# Patient Record
Sex: Male | Born: 1955 | Race: White | Hispanic: No | Smoking: Current some day smoker
Health system: Southern US, Community
[De-identification: ages and names within clinical notes are randomized; demographics above are authoritative.]

## PROBLEM LIST (undated history)

## (undated) DIAGNOSIS — I471 Supraventricular tachycardia, unspecified: Secondary | ICD-10-CM

## (undated) DIAGNOSIS — C7931 Secondary malignant neoplasm of brain: Secondary | ICD-10-CM

## (undated) DIAGNOSIS — I872 Venous insufficiency (chronic) (peripheral): Secondary | ICD-10-CM

## (undated) DIAGNOSIS — J449 Chronic obstructive pulmonary disease, unspecified: Secondary | ICD-10-CM

## (undated) DIAGNOSIS — F1191 Opioid use, unspecified, in remission: Secondary | ICD-10-CM

## (undated) DIAGNOSIS — I890 Lymphedema, not elsewhere classified: Secondary | ICD-10-CM

## (undated) DIAGNOSIS — Z87898 Personal history of other specified conditions: Secondary | ICD-10-CM

## (undated) DIAGNOSIS — R918 Other nonspecific abnormal finding of lung field: Secondary | ICD-10-CM

## (undated) DIAGNOSIS — C4491 Basal cell carcinoma of skin, unspecified: Secondary | ICD-10-CM

## (undated) DIAGNOSIS — F329 Major depressive disorder, single episode, unspecified: Secondary | ICD-10-CM

## (undated) DIAGNOSIS — F32A Depression, unspecified: Secondary | ICD-10-CM

## (undated) HISTORY — DX: Secondary malignant neoplasm of brain: C79.31

## (undated) HISTORY — PX: HERNIA REPAIR: SHX51

---

## 1998-09-07 ENCOUNTER — Emergency Department (HOSPITAL_COMMUNITY): Admission: EM | Admit: 1998-09-07 | Discharge: 1998-09-07 | Payer: Self-pay | Admitting: Emergency Medicine

## 1998-09-07 ENCOUNTER — Encounter: Payer: Self-pay | Admitting: Emergency Medicine

## 2001-05-07 ENCOUNTER — Emergency Department (HOSPITAL_COMMUNITY): Admission: EM | Admit: 2001-05-07 | Discharge: 2001-05-07 | Payer: Self-pay | Admitting: Emergency Medicine

## 2005-03-14 ENCOUNTER — Emergency Department (HOSPITAL_COMMUNITY): Admission: EM | Admit: 2005-03-14 | Discharge: 2005-03-14 | Payer: Self-pay | Admitting: Emergency Medicine

## 2005-11-12 ENCOUNTER — Emergency Department (HOSPITAL_COMMUNITY): Admission: EM | Admit: 2005-11-12 | Discharge: 2005-11-12 | Payer: Self-pay | Admitting: Emergency Medicine

## 2005-12-02 ENCOUNTER — Emergency Department (HOSPITAL_COMMUNITY): Admission: EM | Admit: 2005-12-02 | Discharge: 2005-12-02 | Payer: Self-pay | Admitting: Emergency Medicine

## 2006-02-09 ENCOUNTER — Emergency Department (HOSPITAL_COMMUNITY): Admission: EM | Admit: 2006-02-09 | Discharge: 2006-02-09 | Payer: Self-pay | Admitting: Family Medicine

## 2007-01-30 ENCOUNTER — Emergency Department (HOSPITAL_COMMUNITY): Admission: EM | Admit: 2007-01-30 | Discharge: 2007-01-30 | Payer: Self-pay | Admitting: Emergency Medicine

## 2007-03-29 ENCOUNTER — Emergency Department (HOSPITAL_COMMUNITY): Admission: EM | Admit: 2007-03-29 | Discharge: 2007-03-29 | Payer: Self-pay | Admitting: Emergency Medicine

## 2007-05-25 ENCOUNTER — Emergency Department (HOSPITAL_COMMUNITY): Admission: EM | Admit: 2007-05-25 | Discharge: 2007-05-25 | Payer: Self-pay | Admitting: Emergency Medicine

## 2007-07-07 ENCOUNTER — Emergency Department (HOSPITAL_COMMUNITY): Admission: EM | Admit: 2007-07-07 | Discharge: 2007-07-07 | Payer: Self-pay | Admitting: Emergency Medicine

## 2007-10-18 ENCOUNTER — Inpatient Hospital Stay (HOSPITAL_COMMUNITY): Admission: EM | Admit: 2007-10-18 | Discharge: 2007-10-21 | Payer: Self-pay | Admitting: Emergency Medicine

## 2008-03-26 ENCOUNTER — Emergency Department (HOSPITAL_COMMUNITY): Admission: EM | Admit: 2008-03-26 | Discharge: 2008-03-26 | Payer: Self-pay | Admitting: Family Medicine

## 2008-05-23 ENCOUNTER — Emergency Department (HOSPITAL_COMMUNITY): Admission: EM | Admit: 2008-05-23 | Discharge: 2008-05-23 | Payer: Self-pay | Admitting: Family Medicine

## 2008-07-24 ENCOUNTER — Emergency Department (HOSPITAL_COMMUNITY): Admission: EM | Admit: 2008-07-24 | Discharge: 2008-07-24 | Payer: Self-pay | Admitting: Emergency Medicine

## 2008-09-11 ENCOUNTER — Emergency Department (HOSPITAL_COMMUNITY): Admission: EM | Admit: 2008-09-11 | Discharge: 2008-09-11 | Payer: Self-pay | Admitting: Family Medicine

## 2009-02-05 ENCOUNTER — Emergency Department (HOSPITAL_COMMUNITY): Admission: EM | Admit: 2009-02-05 | Discharge: 2009-02-05 | Payer: Self-pay | Admitting: Family Medicine

## 2010-02-19 ENCOUNTER — Emergency Department (HOSPITAL_COMMUNITY): Admission: EM | Admit: 2010-02-19 | Discharge: 2010-02-20 | Payer: Self-pay | Admitting: Emergency Medicine

## 2010-07-29 LAB — POCT I-STAT, CHEM 8
BUN: 6 mg/dL (ref 6–23)
Creatinine, Ser: 0.9 mg/dL (ref 0.4–1.5)
Glucose, Bld: 92 mg/dL (ref 70–99)
Hemoglobin: 17 g/dL (ref 13.0–17.0)
TCO2: 22 mmol/L (ref 0–100)

## 2010-07-29 LAB — URINE MICROSCOPIC-ADD ON

## 2010-07-29 LAB — URINALYSIS, ROUTINE W REFLEX MICROSCOPIC
Bilirubin Urine: NEGATIVE
Glucose, UA: NEGATIVE mg/dL
Protein, ur: NEGATIVE mg/dL
pH: 5.5 (ref 5.0–8.0)

## 2010-07-29 LAB — URINE CULTURE
Colony Count: NO GROWTH
Culture  Setup Time: 201110070348

## 2010-09-29 NOTE — H&P (Signed)
Omar Wagner, Omar Wagner                 ACCOUNT NO.:  [id number]   MEDICAL RECORD NO.:  [id number]          PATIENT TYPE:  EMS   LOCATION:  ED                           FACILITY:  [facility]   PHYSICIAN:  Mobolaji B. Bakare, M.D.DATE OF BIRTH:  January 24, 1956   DATE OF ADMISSION:  10/17/2007  DATE OF DISCHARGE:                              HISTORY & PHYSICAL   PRIMARY CARE PHYSICIAN:  Unassigned.   CHIEF COMPLAINT:  Left elbow pain and swelling.   HISTORY OF PRESENTING COMPLAINT:  The patient developed a boil over the  left elbow about 3 days ago.  He thought it was a spider bite.  He could  not recall being bitten by a spider.  He pressed on this boil.  The left  elbow subsequently became more swollen and red, associated with pain.  There is no limitation of elbow movement.  He decided to come to the  hospital for evaluation.   He denies fever, chills, no headaches.  He denies nausea, vomiting or  diarrhea.  His appetite has been good.   REVIEW OF SYSTEMS:  No chest pain, cough, shortness of breath, orthopnea  or PND.   PAST MEDICAL HISTORY:  None.   PAST SURGICAL HISTORY:  Hernia repair in childhood.   FAMILY HISTORY:  Both parents are deceased.  Father passed away in his  mid 7s from liver cancer.  Mother passed away in her mid 82s.  She had  breast cancer.   SOCIAL HISTORY:  The patient is a Music therapist.  He occasionally drinks  beer.  He smokes half a pack of cigarettes a day.   INITIAL VITALS:  Temperature 97.8, blood pressure 116/56, pulse of 99,  respiratory rate of 18.  O2 saturations of 94%.  On examination, the patient is awake, alert, oriented in time, place and  person.  Normocephalic, atraumatic head.  Pupils equal, round and reactive to  light.  No elevated JVD.  No carotid bruits.  Mucous membranes moist.  LUNGS:  Clear clinically to auscultation.  CVS:  S1, S2, regular.  No murmur or gallop.  ABDOMEN:  Nondistended, soft, nontender.  Bowel sounds present.  EXTREMITIES: No pedal edema or calf tenderness.  He has symmetric tan  area in his lower legs in (area of exposed skin.).  No distal cyanosis.  Dorsalis pedis pulses palpable bilaterally.  MUSCULOSKELETAL:  There is diffuse swelling, redness and mild tenderness  over left elbow.  The redness has more extended to the antecubital  fossa.  There is some degree of fluctuance but no drainage noted.  Radial pulse intact.  CNS:  No focal neurological deficit.   INITIAL LABORATORY DATA:  Sodium 144, potassium 4.4, chloride 20,  glucose 88, BUN 13, creatinine 0.87, calcium 8.9.  White cells 12.1,  hemoglobin 13.7, hematocrit 39.2, platelets 227.  Absolute neutrophil  count 8.6.  X-ray of the left elbow showed soft tissue swelling without  acute bony or joint abnormality.   ASSESSMENT AND PLAN:  1. [name] is a 55 year old Caucasian male presenting with left      elbow cellulitis/abscess.  He  has leukocytosis of 12,000.  He has      received IV vancomycin in the emergency room.  Will continue IV      vancomycin for methicillin-resistant Staphylococcus aureus      coverage.  We will give Dilaudid 0.5 to 1 mg IV q.4 h. p.r.n. for      severe pain and oxycodone 5 mg p.o. q.4 h. p.r.n. for mild to      moderate pain.  We will obtain surgical consult for incision and      drainage, elevate left elbow.  2. Tobacco abuse.  We will offer tobacco cessation counseling, place a      nicotine patch 14 mg daily.      Mobolaji B. Corky Downs, M.D.  Electronically Signed     MBB/MEDQ  D:  10/18/2007  T:  10/18/2007  Job:  [id number]

## 2010-10-02 NOTE — Discharge Summary (Signed)
Omar Wagner, Omar Wagner                 ACCOUNT NO.:  [id number]   MEDICAL RECORD NO.:  [id number]          PATIENT TYPE:  INP   LOCATION:  1504                         FACILITY:  [facility]   PHYSICIAN:  Hettie Holstein, D.O.    DATE OF BIRTH:  May 17, 1956   DATE OF ADMISSION:  10/17/2007  DATE OF DISCHARGE:  10/21/2007                               DISCHARGE SUMMARY   PRIMARY CARE PHYSICIAN:  Unassigned.   FINAL DIAGNOSES:  1. Cellulitis with a carbuncle, status post incision and drainage and      an inpatient consultation of by Dr. Luretha Murphy.  2. Facial skin lesion, probable basal cell, right cheek.   MEDICATIONS ON DISCHARGE:  1. Clindamycin 450 mg p.o. t.i.d.  2. Percocet 10/325 q.6 h. as needed dispense #20.  3. Over-the-counter Neosporin to open wounds twice daily.   DISPOSITION:  [name] was instructed to follow up with Dr. Wenda Low. He is instructed to keep his wounds clean and dry and elevate  the arm while at rest.   Studies performed this hospitalization included plain radiograph of his  elbow that revealed soft tissue swelling without acute bony or joint  abnormality.   LABORATORY DATA:  CBC with WBC of 8.2, hemoglobin 13.5 and a platelet  count of 235.  His basic metabolic panel revealed a sodium 140,  potassium 3.9, BUN 7, creatinine 0.8.   CONSULTANTS:  This admission:  General surgery, Dr. Luretha Murphy.   PROCEDURES:  None were performed.   HOSPITAL COURSE:  [name] is a pleasant 55 year old male who  developed a boil over the left elbow about 3 days prior to presentation.  He thought it was a spider bite. He cannot recall being bitten.  He  pressed on this boil, he stated that his elbow became more swollen. He  subsequently presented to the emergency department. He was admitted,  initially started on IV antibiotics with vancomycin for consideration of  Staph.  He required some IV narcotics for pain control.  Ultimately was  seen by general surgery,  Dr. Luretha Murphy, who evaluated this and  noticed some multiple pustules located on his elbow with surrounding  cellulitis.  No other palpable fluctuant areas were noted, and incision  and drainage was performed. There was a moderate amount of yellow-white,  nonfocal-  smelling pus. He was subsequently treated with continued IV vancomycin  and ultimately transitioned to oral clindamycin which he tolerated well.  He was felt to be stable for discharge home and follow up with Dr.  Daphine Deutscher in a week.  He is provided a number to call to schedule this  appointment.      Hettie Holstein, D.O.  Electronically Signed     ESS/MEDQ  D:  01/18/2008  T:  01/19/2008  Job:  [id number]

## 2010-10-08 ENCOUNTER — Ambulatory Visit (INDEPENDENT_AMBULATORY_CARE_PROVIDER_SITE_OTHER): Payer: Self-pay

## 2010-10-08 ENCOUNTER — Inpatient Hospital Stay (INDEPENDENT_AMBULATORY_CARE_PROVIDER_SITE_OTHER)
Admission: RE | Admit: 2010-10-08 | Discharge: 2010-10-08 | Disposition: A | Discharge status: Home / Self Care | Payer: Self-pay | Source: Ambulatory Visit | Attending: Emergency Medicine | Admitting: Emergency Medicine

## 2010-10-08 DIAGNOSIS — J40 Bronchitis, not specified as acute or chronic: Secondary | ICD-10-CM

## 2010-10-18 ENCOUNTER — Emergency Department (HOSPITAL_COMMUNITY)
Admission: EM | Admit: 2010-10-18 | Discharge: 2010-10-19 | Disposition: A | Discharge status: Home / Self Care | Payer: Self-pay | Attending: Emergency Medicine | Admitting: Emergency Medicine

## 2010-10-18 DIAGNOSIS — R109 Unspecified abdominal pain: Secondary | ICD-10-CM | POA: Insufficient documentation

## 2010-10-18 DIAGNOSIS — R319 Hematuria, unspecified: Secondary | ICD-10-CM | POA: Insufficient documentation

## 2010-10-18 DIAGNOSIS — N390 Urinary tract infection, site not specified: Secondary | ICD-10-CM | POA: Insufficient documentation

## 2010-10-19 ENCOUNTER — Encounter (HOSPITAL_COMMUNITY): Payer: Self-pay

## 2010-10-19 ENCOUNTER — Emergency Department (HOSPITAL_COMMUNITY): Payer: Self-pay

## 2010-10-19 LAB — URINALYSIS, ROUTINE W REFLEX MICROSCOPIC
Bilirubin Urine: NEGATIVE
Glucose, UA: NEGATIVE mg/dL
Protein, ur: 100 mg/dL — AB
Specific Gravity, Urine: 1.008 (ref 1.005–1.030)
Urobilinogen, UA: 1 mg/dL (ref 0.0–1.0)

## 2010-10-19 LAB — URINE MICROSCOPIC-ADD ON

## 2010-10-19 LAB — BASIC METABOLIC PANEL
Calcium: 8.9 mg/dL (ref 8.4–10.5)
GFR calc Af Amer: >60 mL/min (ref >60)
GFR calc non Af Amer: >60 mL/min (ref >60)
Glucose, Bld: 74 mg/dL (ref 70–99)
Potassium: 4.4 mEq/L (ref 3.5–5.1)
Sodium: 141 mEq/L (ref 135–145)

## 2010-11-24 ENCOUNTER — Ambulatory Visit (HOSPITAL_COMMUNITY)
Admission: EM | Admit: 2010-11-24 | Discharge: 2010-11-24 | Discharge status: Against Medical Advice | Payer: Self-pay | Source: Home / Self Care | Attending: Emergency Medicine | Admitting: Emergency Medicine

## 2010-11-24 ENCOUNTER — Emergency Department (HOSPITAL_COMMUNITY)
Admission: EM | Admit: 2010-11-24 | Discharge: 2010-11-24 | Disposition: A | Discharge status: Home / Self Care | Payer: Self-pay | Attending: Emergency Medicine | Admitting: Emergency Medicine

## 2010-11-24 DIAGNOSIS — N390 Urinary tract infection, site not specified: Secondary | ICD-10-CM | POA: Insufficient documentation

## 2010-11-24 DIAGNOSIS — R109 Unspecified abdominal pain: Secondary | ICD-10-CM | POA: Insufficient documentation

## 2010-11-24 DIAGNOSIS — R319 Hematuria, unspecified: Secondary | ICD-10-CM | POA: Insufficient documentation

## 2010-11-24 LAB — URINALYSIS, ROUTINE W REFLEX MICROSCOPIC
Nitrite: NEGATIVE
Specific Gravity, Urine: 1.011 (ref 1.005–1.030)
Urobilinogen, UA: 0.2 mg/dL (ref 0.0–1.0)
pH: 5 (ref 5.0–8.0)

## 2010-11-24 LAB — URINE MICROSCOPIC-ADD ON

## 2011-01-26 ENCOUNTER — Emergency Department (HOSPITAL_COMMUNITY)
Admission: EM | Admit: 2011-01-26 | Discharge: 2011-01-27 | Disposition: A | Discharge status: Home / Self Care | Payer: Self-pay | Attending: Emergency Medicine | Admitting: Emergency Medicine

## 2011-01-26 DIAGNOSIS — L02419 Cutaneous abscess of limb, unspecified: Secondary | ICD-10-CM | POA: Insufficient documentation

## 2011-01-27 ENCOUNTER — Emergency Department (HOSPITAL_COMMUNITY): Payer: Self-pay

## 2011-01-27 LAB — CBC
HCT: 45.2 % (ref 39.0–52.0)
MCHC: 33.8 g/dL (ref 30.0–36.0)
Platelets: 135 10*3/uL — ABNORMAL LOW (ref 150–400)
RDW: 13.5 % (ref 11.5–15.5)
WBC: 7.3 10*3/uL (ref 4.0–10.5)

## 2011-01-27 LAB — DIFFERENTIAL
Basophils Absolute: 0 10*3/uL (ref 0.0–0.1)
Basophils Relative: 0 % (ref 0–1)
Eosinophils Absolute: 0.1 10*3/uL (ref 0.0–0.7)
Eosinophils Relative: 2 % (ref 0–5)
Monocytes Absolute: 1 10*3/uL (ref 0.1–1.0)

## 2011-01-30 ENCOUNTER — Emergency Department (HOSPITAL_COMMUNITY): Payer: Self-pay

## 2011-01-30 ENCOUNTER — Emergency Department (HOSPITAL_COMMUNITY)
Admission: EM | Admit: 2011-01-30 | Discharge: 2011-01-30 | Disposition: A | Discharge status: Home / Self Care | Payer: Self-pay | Attending: Emergency Medicine | Admitting: Emergency Medicine

## 2011-01-30 DIAGNOSIS — Z09 Encounter for follow-up examination after completed treatment for conditions other than malignant neoplasm: Secondary | ICD-10-CM | POA: Insufficient documentation

## 2011-01-30 DIAGNOSIS — L02419 Cutaneous abscess of limb, unspecified: Secondary | ICD-10-CM | POA: Insufficient documentation

## 2011-02-04 LAB — URINE CULTURE: Colony Count: >=100000

## 2011-02-04 LAB — URINALYSIS, ROUTINE W REFLEX MICROSCOPIC
Nitrite: NEGATIVE
Protein, ur: NEGATIVE
Urobilinogen, UA: 0.2

## 2011-02-04 LAB — URINE MICROSCOPIC-ADD ON

## 2011-02-04 LAB — BASIC METABOLIC PANEL
BUN: 8
CO2: 29
Calcium: 8.6
Glucose, Bld: 101 — ABNORMAL HIGH
Potassium: 4.6
Sodium: 138

## 2011-02-11 LAB — DIFFERENTIAL
Basophils Absolute: 0.1
Basophils Relative: 1
Eosinophils Absolute: 0.2
Eosinophils Absolute: 0.2
Lymphs Abs: 2.6
Monocytes Absolute: 0.7
Monocytes Absolute: 0.8
Monocytes Relative: 6
Neutro Abs: 8.2 — ABNORMAL HIGH
Neutrophils Relative %: 67
Neutrophils Relative %: 71

## 2011-02-11 LAB — BASIC METABOLIC PANEL
BUN: 7
CO2: 20
CO2: 27
Calcium: 8.5
Chloride: 109
Creatinine, Ser: 0.87
Creatinine, Ser: 0.88
GFR calc Af Amer: >60
Glucose, Bld: 180 — ABNORMAL HIGH
Potassium: 4.1
Sodium: 141

## 2011-02-11 LAB — CBC
Hemoglobin: 12.9 — ABNORMAL LOW
Hemoglobin: 13.7
MCHC: 34
MCHC: 34.3
MCHC: 35.1
MCV: 98.7
MCV: 99.6
Platelets: 235
RBC: 3.77 — ABNORMAL LOW
RBC: 3.97 — ABNORMAL LOW
RDW: 13.5
RDW: 13.7
WBC: 12.1 — ABNORMAL HIGH

## 2011-02-11 LAB — VANCOMYCIN, TROUGH: Vancomycin Tr: 8.2

## 2011-03-30 ENCOUNTER — Emergency Department (INDEPENDENT_AMBULATORY_CARE_PROVIDER_SITE_OTHER)
Admission: EM | Admit: 2011-03-30 | Discharge: 2011-03-30 | Disposition: A | Discharge status: Home / Self Care | Payer: Self-pay | Source: Home / Self Care | Attending: Family Medicine | Admitting: Family Medicine

## 2011-03-30 ENCOUNTER — Encounter (HOSPITAL_COMMUNITY): Payer: Self-pay | Admitting: *Deleted

## 2011-03-30 ENCOUNTER — Emergency Department (INDEPENDENT_AMBULATORY_CARE_PROVIDER_SITE_OTHER): Payer: Self-pay

## 2011-03-30 DIAGNOSIS — J449 Chronic obstructive pulmonary disease, unspecified: Secondary | ICD-10-CM

## 2011-03-30 DIAGNOSIS — Z85828 Personal history of other malignant neoplasm of skin: Secondary | ICD-10-CM

## 2011-03-30 DIAGNOSIS — J4489 Other specified chronic obstructive pulmonary disease: Secondary | ICD-10-CM

## 2011-03-30 HISTORY — DX: Chronic obstructive pulmonary disease, unspecified: J44.9

## 2011-03-30 HISTORY — DX: Basal cell carcinoma of skin, unspecified: C44.91

## 2011-03-30 MED ORDER — ALBUTEROL SULFATE (5 MG/ML) 0.5% IN NEBU
INHALATION_SOLUTION | RESPIRATORY_TRACT | Status: AC
  Filled 2011-03-30: qty 1

## 2011-03-30 MED ORDER — IPRATROPIUM BROMIDE 0.02 % IN SOLN
0.5000 mg | Freq: Once | RESPIRATORY_TRACT | Status: AC
  Administered 2011-03-30: 0.5 mg via RESPIRATORY_TRACT

## 2011-03-30 MED ORDER — ALBUTEROL SULFATE (5 MG/ML) 0.5% IN NEBU
5.0000 mg | INHALATION_SOLUTION | Freq: Once | RESPIRATORY_TRACT | Status: AC
  Administered 2011-03-30: 5 mg via RESPIRATORY_TRACT

## 2011-03-30 MED ORDER — ALBUTEROL SULFATE HFA 108 (90 BASE) MCG/ACT IN AERS: 1.0000 | INHALATION_SPRAY | Freq: Four times a day (QID) | RESPIRATORY_TRACT | Status: DC | PRN

## 2011-03-30 NOTE — ED Notes (Signed)
Breathing much easier now.  Waiting for xray results.  Offered pt something to drink, but he declined

## 2011-03-30 NOTE — ED Provider Notes (Signed)
History     CSN: [id number] Arrival date & time: 03/30/2011  8:19 AM   First MD Initiated Contact with Patient 03/30/11 [phone]      Chief Complaint  Patient presents with  . Shortness of Breath  . Skin Ulcer    (Consider location/radiation/quality/duration/timing/severity/associated sxs/prior treatment) Patient is a 55 y.o. male presenting with shortness of breath. The history is provided by the patient.  Shortness of Breath  Episode onset: ran out of inhaler 2 mos ago, continues to smoke,  The onset was gradual. The problem occurs continuously. The problem has been gradually worsening. The problem is moderate. The symptoms are relieved by nothing. Associated symptoms include shortness of breath and wheezing. He has inhaled smoke recently. His past medical history is significant for past wheezing. Past medical history comments: h/o copd.    Past Medical History  Diagnosis Date  . COPD (chronic obstructive pulmonary disease)   . Basal cell carcinoma     History reviewed. No pertinent past surgical history.  No family history on file.  History  Substance Use Topics  . Smoking status: Current Everyday Smoker -- 0.5 packs/day  . Smokeless tobacco: Not on file  . Alcohol Use: Yes     social      Review of Systems  Respiratory: Positive for shortness of breath and wheezing.   Skin: Positive for rash.    Allergies  Review of patient's allergies indicates no known allergies.  Home Medications   Current Outpatient Rx  Name Route Sig Dispense Refill  . ALBUTEROL SULFATE HFA IN Inhalation Inhale into the lungs.        BP 142/92  Pulse 100  Temp(Src) 97 F (36.1 C) (Oral)  Resp 24  SpO2 95%  Physical Exam  Constitutional: He appears well-developed and well-nourished.  HENT:  Head: Normocephalic and atraumatic.  Neck: Normal range of motion. Neck supple. No JVD present. No tracheal deviation present.  Cardiovascular: Normal rate, regular rhythm, normal heart  sounds and intact distal pulses.   Pulmonary/Chest: No stridor. He has decreased breath sounds. He has rhonchi. He has no rales.  Lymphadenopathy:    He has no cervical adenopathy.  Skin: Lesion noted.       ED Course  Procedures (including critical care time)  Labs Reviewed - No data to display No results found.   No diagnosis found.  Sx improved after neb treatment.  MDM  X-rays reviewed and report per radiologist.        Barkley Bruns, MD 03/30/11 [phone]

## 2011-03-30 NOTE — ED Notes (Signed)
Pt has hx of COPD.  Has been out of inhaler for about 2 mos.  States sob gradually getting worse.  Denies fever or cough.  Pt talking in short, choppy sentences.  Also wants to skin lesions on his face checked.  States he had the areas burned off by [facility] over a year ago, was told it was basal cell carcinoma.  States the lesions are worse looking and painful.  One on right side of nose and one on right cheek, dry, discolored lesions.

## 2011-06-10 ENCOUNTER — Emergency Department (HOSPITAL_COMMUNITY)
Admission: EM | Admit: 2011-06-10 | Discharge: 2011-06-10 | Disposition: A | Discharge status: Home / Self Care | Payer: Self-pay | Attending: Emergency Medicine | Admitting: Emergency Medicine

## 2011-06-10 ENCOUNTER — Encounter (HOSPITAL_COMMUNITY): Payer: Self-pay | Admitting: *Deleted

## 2011-06-10 DIAGNOSIS — L02419 Cutaneous abscess of limb, unspecified: Secondary | ICD-10-CM | POA: Insufficient documentation

## 2011-06-10 DIAGNOSIS — Z85828 Personal history of other malignant neoplasm of skin: Secondary | ICD-10-CM | POA: Insufficient documentation

## 2011-06-10 DIAGNOSIS — Z79899 Other long term (current) drug therapy: Secondary | ICD-10-CM | POA: Insufficient documentation

## 2011-06-10 DIAGNOSIS — IMO0002 Reserved for concepts with insufficient information to code with codable children: Secondary | ICD-10-CM | POA: Insufficient documentation

## 2011-06-10 DIAGNOSIS — F17200 Nicotine dependence, unspecified, uncomplicated: Secondary | ICD-10-CM | POA: Insufficient documentation

## 2011-06-10 DIAGNOSIS — J449 Chronic obstructive pulmonary disease, unspecified: Secondary | ICD-10-CM | POA: Insufficient documentation

## 2011-06-10 DIAGNOSIS — L03119 Cellulitis of unspecified part of limb: Secondary | ICD-10-CM | POA: Insufficient documentation

## 2011-06-10 DIAGNOSIS — J4489 Other specified chronic obstructive pulmonary disease: Secondary | ICD-10-CM | POA: Insufficient documentation

## 2011-06-10 MED ORDER — OXYCODONE-ACETAMINOPHEN 5-325 MG PO TABS
2.0000 | ORAL_TABLET | Freq: Once | ORAL | Status: AC
  Administered 2011-06-10: 2 via ORAL
  Filled 2011-06-10: qty 2

## 2011-06-10 MED ORDER — CLINDAMYCIN HCL 150 MG PO CAPS: 450.0000 mg | ORAL_CAPSULE | Freq: Three times a day (TID) | ORAL | Status: AC

## 2011-06-10 MED ORDER — MORPHINE SULFATE 4 MG/ML IJ SOLN
4.0000 mg | Freq: Once | INTRAMUSCULAR | Status: AC
  Administered 2011-06-10: 4 mg via INTRAVENOUS
  Filled 2011-06-10: qty 1

## 2011-06-10 MED ORDER — HYDROCODONE-ACETAMINOPHEN 7.5-500 MG PO TABS: 1.0000 | ORAL_TABLET | Freq: Four times a day (QID) | ORAL | Status: AC | PRN

## 2011-06-10 MED ORDER — CLINDAMYCIN PHOSPHATE 900 MG/50ML IV SOLN
900.0000 mg | Freq: Once | INTRAVENOUS | Status: AC
  Administered 2011-06-10: 900 mg via INTRAVENOUS
  Filled 2011-06-10: qty 50

## 2011-06-10 NOTE — ED Provider Notes (Signed)
History     CSN: [id number]  Arrival date & time 06/10/11  1314   First MD Initiated Contact with Patient 06/10/11 1347      Chief Complaint  Patient presents with  . Wound Infection     The history is provided by the patient.  The patient is a 56 year old male who presents with a gradually worsening wound to the left inner ankle. While cutting bushes 10 days ago, he sustained an abrasion and what he thought was an insect bite to the left ankle. He noticed redness to the "bite" area the next day and took 3 days-worth of doxycycline he had left over from a prior illness with some temporary improvement. After he finished the doxy, the wound gradually worsened again with erythema and swelling until it opened and began to drain purulent material in the last couple of days. There has been associated pain to the ankle that is worse with walking. There is no radiation of the pain. He denies any fever, chills, redness streaking up the affected extremity, numbness, or weakness to the affected extremity.  Past Medical History  Diagnosis Date  . COPD (chronic obstructive pulmonary disease)   . Basal cell carcinoma     History reviewed. No pertinent past surgical history.  No family history on file.  History  Substance Use Topics  . Smoking status: Current Everyday Smoker -- 0.5 packs/day  . Smokeless tobacco: Not on file  . Alcohol Use: Yes     social      Review of Systems  Constitutional: Negative for fever and chills.  HENT: Negative for neck pain and neck stiffness.   Eyes: Negative for pain and visual disturbance.  Respiratory: Negative for cough, chest tightness and shortness of breath.   Cardiovascular: Negative for chest pain.  Gastrointestinal: Negative for nausea, vomiting and abdominal pain.  Genitourinary: Negative for flank pain.  Musculoskeletal: Negative for back pain, joint swelling and gait problem.  Skin: Positive for color change and wound.  Neurological:  Negative for syncope, weakness and numbness.  Psychiatric/Behavioral: Negative for confusion.    Allergies  Review of patient's allergies indicates no known allergies.  Home Medications   Current Outpatient Rx  Name Route Sig Dispense Refill  . ALBUTEROL SULFATE HFA 108 (90 BASE) MCG/ACT IN AERS Inhalation Inhale 1-2 puffs into the lungs every 6 (six) hours as needed for wheezing. 1 Inhaler 1  . IBUPROFEN 200 MG PO TABS Oral Take 1,000 mg by mouth every 6 (six) hours as needed. For pain    . ADULT MULTIVITAMIN W/MINERALS CH Oral Take 1 tablet by mouth daily.    Marland Kitchen VITAMIN C 500 MG PO TABS Oral Take 500 mg by mouth daily.      BP 113/82  Pulse 112  Temp(Src) 97.4 F (36.3 C) (Oral)  Resp 15  SpO2 97%  Physical Exam  Nursing note and vitals reviewed. Constitutional: He is oriented to person, place, and time. He appears well-developed and well-nourished. No distress.  HENT:  Head: Normocephalic and atraumatic.  Right Ear: External ear normal.  Left Ear: External ear normal.  Mouth/Throat: Oropharynx is clear and moist.  Eyes: Pupils are equal, round, and reactive to light.  Neck: Normal range of motion. Neck supple.  Cardiovascular: Regular rhythm and intact distal pulses.        tachycardia  Pulmonary/Chest: Effort normal and breath sounds normal. No respiratory distress. He has no wheezes. He exhibits no tenderness.  Abdominal: Soft. Bowel sounds are  normal. He exhibits no distension. There is no tenderness.  Musculoskeletal: Normal range of motion. He exhibits tenderness. He exhibits no edema.       See skin exam. Capillary refill < 2 seconds.  Neurological: He is alert and oriented to person, place, and time. No cranial nerve deficit.       Sensation intact to light touch  Skin: Skin is warm and dry.       Left ankle: 5cm x 3cm area of erythema and skin peeling/breakdown with several small ulcerations. Purulent material easily expressed from wound, which is exquisitely  tender to palpation. There is no erythematous streaking. Wound edges are outlined.    ED Course  Procedures (including critical care time)   Labs Reviewed  WOUND CULTURE   No results found.   Dx 1: abscess and cellulitis left ankle   MDM  Wound infection with draining abscess. Culture sent. Clindamycin IV ordered. Pt will be sent home with clindamycin rx x 10 days and has been advised to have wound re-checked in 48 hours. PA-C Marlon Pel will disposition pt when abx completed.        Elwyn Reach [place], [place] 06/10/11 [phone]

## 2011-06-10 NOTE — ED Notes (Signed)
Pt states he was clearing out some bushes and thinks he may have been cut by metal. Pt states the wound started out as a boil and now the wound will not heal and is painful to walk. Pt states he had abx at home he took from a previous visit.

## 2011-06-11 NOTE — ED Provider Notes (Signed)
Medical screening examination/treatment/procedure(s) were conducted as a shared visit with non-physician practitioner(s) and myself.  I personally evaluated the patient during the encounter.  56 year old male with wound medial aspect of distal left shin. Likely secondarily infected after abrasion while clearing brush. There appears to only be relatively superficial involvement. Some purulent drainage noted. No significant surrounding cellulitis. There is no crepitus or bullae. There is no streaking. Neurovascularly intact distally. Afebrile and well-appearing. Doubt involvement of deeper structures. No history of diabetes, chronic steroid use or other immunocompromising state. Plan course of by mouth antibiotics after dose of IV antibiotics in the ER. Discussed with patient that like him to be reevaluated in about 48 hours to assess response to treatment. Return precautions sooner discussed.  Raeford Razor, MD 06/11/11 [phone]

## 2011-06-13 LAB — WOUND CULTURE

## 2011-06-14 NOTE — ED Notes (Signed)
+   Wound-patient treated with clindamycin-sensitive to same-Chart appended per protocol MD.

## 2011-06-22 ENCOUNTER — Emergency Department (HOSPITAL_COMMUNITY)
Admission: EM | Admit: 2011-06-22 | Discharge: 2011-06-23 | Disposition: A | Discharge status: Home / Self Care | Payer: Self-pay | Attending: Emergency Medicine | Admitting: Emergency Medicine

## 2011-06-22 ENCOUNTER — Encounter (HOSPITAL_COMMUNITY): Payer: Self-pay

## 2011-06-22 DIAGNOSIS — Z79899 Other long term (current) drug therapy: Secondary | ICD-10-CM | POA: Insufficient documentation

## 2011-06-22 DIAGNOSIS — T148XXA Other injury of unspecified body region, initial encounter: Secondary | ICD-10-CM | POA: Insufficient documentation

## 2011-06-22 DIAGNOSIS — J449 Chronic obstructive pulmonary disease, unspecified: Secondary | ICD-10-CM | POA: Insufficient documentation

## 2011-06-22 DIAGNOSIS — F17200 Nicotine dependence, unspecified, uncomplicated: Secondary | ICD-10-CM | POA: Insufficient documentation

## 2011-06-22 DIAGNOSIS — M25579 Pain in unspecified ankle and joints of unspecified foot: Secondary | ICD-10-CM | POA: Insufficient documentation

## 2011-06-22 DIAGNOSIS — X58XXXA Exposure to other specified factors, initial encounter: Secondary | ICD-10-CM | POA: Insufficient documentation

## 2011-06-22 DIAGNOSIS — J4489 Other specified chronic obstructive pulmonary disease: Secondary | ICD-10-CM | POA: Insufficient documentation

## 2011-06-22 DIAGNOSIS — L089 Local infection of the skin and subcutaneous tissue, unspecified: Secondary | ICD-10-CM | POA: Insufficient documentation

## 2011-06-22 MED ORDER — HYDROCODONE-ACETAMINOPHEN 5-325 MG PO TABS
1.0000 | ORAL_TABLET | Freq: Once | ORAL | Status: AC
  Administered 2011-06-22: 1 via ORAL
  Filled 2011-06-22: qty 1

## 2011-06-22 MED ORDER — CEPHALEXIN 500 MG PO CAPS: 500.0000 mg | ORAL_CAPSULE | Freq: Four times a day (QID) | ORAL | Status: AC

## 2011-06-22 MED ORDER — HYDROCODONE-ACETAMINOPHEN 5-325 MG PO TABS: 1.0000 | ORAL_TABLET | Freq: Four times a day (QID) | ORAL | Status: AC | PRN

## 2011-06-22 MED ORDER — MUPIROCIN CALCIUM 2 % EX CREA: TOPICAL_CREAM | Freq: Three times a day (TID) | CUTANEOUS | Status: DC

## 2011-06-22 MED ORDER — HYDROCODONE-ACETAMINOPHEN 5-325 MG PO TABS: 1.0000 | ORAL_TABLET | Freq: Four times a day (QID) | ORAL | Status: DC | PRN

## 2011-06-22 NOTE — ED Provider Notes (Signed)
History     CSN: [id number]  Arrival date & time 06/22/11  2049   First MD Initiated Contact with Patient 06/22/11 2300      Chief Complaint  Patient presents with  . Wound Check    LT medial ankle area.  last seen here on 06/10/11    (Consider location/radiation/quality/duration/timing/severity/associated sxs/prior treatment) HPI Comments: Patient returns to the hospital tonight due to unhealing wound on his left medial ankle. He was seen here on 06/10/2011 for the same wound and was given a 10 day course of doxycycline. He did not return for a 48 hour wound check due to "transportation issues." He finished the doxycycline 3 days ago and ran out of his pain medication, is having continued pain since then. He believes the wound is healing but is concerned about the persistent pain and tenderness and does not want the wound to get any worse. He has tried ibuprofen for the pain which has helped a bit. He denies fevers, increased erythema, any edema of the leg.  No new injury.    Patient is a 56 y.o. male presenting with wound check. The history is provided by the patient.  Wound Check     Past Medical History  Diagnosis Date  . COPD (chronic obstructive pulmonary disease)   . Basal cell carcinoma     History reviewed. No pertinent past surgical history.  History reviewed. No pertinent family history.  History  Substance Use Topics  . Smoking status: Current Everyday Smoker -- 0.5 packs/day    Types: Cigarettes  . Smokeless tobacco: Not on file  . Alcohol Use: Yes     social      Review of Systems  Constitutional: Negative for fever and activity change.  All other systems reviewed and are negative.    Allergies  Review of patient's allergies indicates no known allergies.  Home Medications   Current Outpatient Rx  Name Route Sig Dispense Refill  . ALBUTEROL SULFATE HFA 108 (90 BASE) MCG/ACT IN AERS Inhalation Inhale 1-2 puffs into the lungs every 6 (six) hours as  needed for wheezing. 1 Inhaler 1  . VITAMIN C 1000 MG PO TABS Oral Take 3,000 mg by mouth daily.    . IBUPROFEN 200 MG PO TABS Oral Take 1,000 mg by mouth every 8 (eight) hours as needed. For pain    . ADULT MULTIVITAMIN W/MINERALS CH Oral Take 1 tablet by mouth daily.      BP 105/87  Pulse 118  Temp(Src) 99 F (37.2 C) (Oral)  Resp 18  Ht 6' (1.829 m)  Wt 170 lb (77.111 kg)  BMI 23.06 kg/m2  SpO2 95%  Physical Exam  Nursing note and vitals reviewed. Constitutional: He is oriented to person, place, and time. He appears well-developed and well-nourished.  HENT:  Head: Normocephalic and atraumatic.  Neck: Neck supple.  Pulmonary/Chest: Effort normal.  Musculoskeletal:       Left calf is nontender.  Distal pulses intact.   Neurological: He is alert and oriented to person, place, and time.  Skin:       ED Course  Procedures (including critical care time)  Labs Reviewed - No data to display No results found.   1. Wound infection       MDM  Nontoxic afebrile patient with healing wound of left medial ankle but with small amount of surrounding erythema and purulent discharge.  Per patient report and comparison to prior chart, wound is improving, though with active purulent drainage and  surrounding erythema, will give second round of PO antibiotics as well as Bactroban to encourage healing.  Discussed wound care and follow up with patient, reasons for immediate return.  Patient verbalizes understanding and agrees with plan.         Dillard Cannon [place], [place] 06/23/11 [phone]

## 2011-06-22 NOTE — ED Notes (Signed)
Pt's initial injury to LT medial ankle was around the new year.  States he had some old abx at home and took them stating that they helped at first but then the ankle became very infected.  Was seen here on 06/10/11, given abx RX that he couldn't afford so was given "a lesser one" that he states he has completed.  Was supposed to return for recheck about a week ago but had no transportation to get here. States the wound is still oozing and painful but denies it being any worse.  Has been using ibuprofen w/o relief.

## 2011-06-22 NOTE — ED Notes (Signed)
Pt here for a wound recheck on his left ankle, finished antibiotics and wound doesn't look any better

## 2011-06-23 NOTE — ED Provider Notes (Signed)
Medical screening examination/treatment/procedure(s) were performed by non-physician practitioner and as supervising physician I was immediately available for consultation/collaboration.  Nicholes Stairs, MD 06/23/11 [phone]

## 2011-06-26 ENCOUNTER — Encounter (HOSPITAL_COMMUNITY): Payer: Self-pay | Admitting: Emergency Medicine

## 2011-06-26 ENCOUNTER — Emergency Department (HOSPITAL_COMMUNITY)
Admission: EM | Admit: 2011-06-26 | Discharge: 2011-06-27 | Disposition: A | Discharge status: Home / Self Care | Payer: Self-pay | Attending: Emergency Medicine | Admitting: Emergency Medicine

## 2011-06-26 DIAGNOSIS — R000 Tachycardia, unspecified: Secondary | ICD-10-CM | POA: Insufficient documentation

## 2011-06-26 DIAGNOSIS — F17200 Nicotine dependence, unspecified, uncomplicated: Secondary | ICD-10-CM | POA: Insufficient documentation

## 2011-06-26 DIAGNOSIS — R109 Unspecified abdominal pain: Secondary | ICD-10-CM | POA: Insufficient documentation

## 2011-06-26 DIAGNOSIS — R300 Dysuria: Secondary | ICD-10-CM | POA: Insufficient documentation

## 2011-06-26 DIAGNOSIS — R310 Gross hematuria: Secondary | ICD-10-CM | POA: Insufficient documentation

## 2011-06-26 DIAGNOSIS — Z79899 Other long term (current) drug therapy: Secondary | ICD-10-CM | POA: Insufficient documentation

## 2011-06-26 NOTE — ED Notes (Signed)
Pt reports that he started to have bloody urine and painful urination that started on Thursday afternoon, pt took OTC IBU, no relief, pt still taking Rx anb when he was here about week ago for the wound check.

## 2011-06-27 ENCOUNTER — Emergency Department (HOSPITAL_COMMUNITY): Payer: Self-pay

## 2011-06-27 LAB — DIFFERENTIAL
Eosinophils Relative: 0 % (ref 0–5)
Lymphocytes Relative: 22 % (ref 12–46)
Lymphs Abs: 2.1 10*3/uL (ref 0.7–4.0)

## 2011-06-27 LAB — CBC
MCV: 95.3 fL (ref 78.0–100.0)
Platelets: 174 10*3/uL (ref 150–400)
RBC: 4.64 MIL/uL (ref 4.22–5.81)
WBC: 9.5 10*3/uL (ref 4.0–10.5)

## 2011-06-27 LAB — URINALYSIS, ROUTINE W REFLEX MICROSCOPIC
Glucose, UA: NEGATIVE mg/dL
Specific Gravity, Urine: 1.03 (ref 1.005–1.030)
Urobilinogen, UA: 1 mg/dL (ref 0.0–1.0)
pH: 5.5 (ref 5.0–8.0)

## 2011-06-27 LAB — POCT I-STAT, CHEM 8
BUN: 12 mg/dL (ref 6–23)
Calcium, Ion: 1.12 mmol/L (ref 1.12–1.32)
Hemoglobin: 16 g/dL (ref 13.0–17.0)
Sodium: 141 mEq/L (ref 135–145)
TCO2: 23 mmol/L (ref 0–100)

## 2011-06-27 LAB — PROTIME-INR: Prothrombin Time: 12.4 seconds (ref 11.6–15.2)

## 2011-06-27 MED ORDER — HYDROMORPHONE HCL PF 1 MG/ML IJ SOLN
1.0000 mg | Freq: Once | INTRAMUSCULAR | Status: AC
  Administered 2011-06-27: 1 mg via INTRAVENOUS
  Filled 2011-06-27: qty 1

## 2011-06-27 MED ORDER — IOHEXOL 300 MG/ML  SOLN
100.0000 mL | Freq: Once | INTRAMUSCULAR | Status: AC | PRN
  Administered 2011-06-27: 100 mL via INTRAVENOUS

## 2011-06-27 MED ORDER — CIPROFLOXACIN HCL 500 MG PO TABS: 500.0000 mg | ORAL_TABLET | Freq: Two times a day (BID) | ORAL | Status: AC

## 2011-06-27 MED ORDER — CIPROFLOXACIN HCL 500 MG PO TABS
500.0000 mg | ORAL_TABLET | Freq: Once | ORAL | Status: AC
  Administered 2011-06-27: 500 mg via ORAL
  Filled 2011-06-27: qty 1

## 2011-06-27 MED ORDER — OXYCODONE-ACETAMINOPHEN 5-325 MG PO TABS
2.0000 | ORAL_TABLET | Freq: Once | ORAL | Status: AC
  Administered 2011-06-27: 2 via ORAL
  Filled 2011-06-27: qty 2

## 2011-06-27 MED ORDER — OXYCODONE-ACETAMINOPHEN 5-325 MG PO TABS: 1.0000 | ORAL_TABLET | Freq: Four times a day (QID) | ORAL | Status: AC | PRN

## 2011-06-27 MED ORDER — ONDANSETRON HCL 4 MG/2ML IJ SOLN
4.0000 mg | Freq: Once | INTRAMUSCULAR | Status: AC
  Administered 2011-06-27: 4 mg via INTRAVENOUS
  Filled 2011-06-27: qty 2

## 2011-06-27 MED ORDER — SODIUM CHLORIDE 0.9 % IV SOLN
Freq: Once | INTRAVENOUS | Status: AC
  Administered 2011-06-27: 01:00:00 via INTRAVENOUS

## 2011-06-27 NOTE — ED Provider Notes (Signed)
Medical screening examination/treatment/procedure(s) were performed by non-physician practitioner and as supervising physician I was immediately available for consultation/collaboration.  Bentlie Withem K Mirabella Hilario-Rasch, MD 06/27/11 0548 

## 2011-06-27 NOTE — ED Provider Notes (Signed)
History     CSN: [id number]  Arrival date & time 06/26/11  2222   First MD Initiated Contact with Patient 06/27/11 0006      Chief Complaint  Patient presents with  . Hematuria  . Dysuria    (Consider location/radiation/quality/duration/timing/severity/associated sxs/prior treatment) HPI Comments: Patient states on Thursday.  He was working in his chart in the wood pile and slipped, falling, hitting his right flank area on several long stacked behind him.  He is been urinating blood since then.  Denies clots, fever, dysuria, now is having pain that radiates into his right lower quadrant  Patient is a 56 y.o. male presenting with hematuria and dysuria. The history is provided by the patient.  Hematuria This is a new problem. The current episode started in the past 7 days. The problem has been gradually worsening since onset. He describes the hematuria as gross hematuria. The hematuria occurs during the initial portion of his urinary stream. He reports no clotting in his urine stream. His pain is at a severity of 6/10. The pain is moderate. He describes his urine color as dark red. Irritative symptoms do not include frequency. Obstructive symptoms do not include incomplete emptying. Associated symptoms include abdominal pain, dysuria and flank pain. Pertinent negatives include no chills, fever, nausea or vomiting.  Dysuria  Associated symptoms include hematuria and flank pain. Pertinent negatives include no chills, no nausea, no vomiting and no frequency.    Past Medical History  Diagnosis Date  . COPD (chronic obstructive pulmonary disease)   . Basal cell carcinoma     History reviewed. No pertinent past surgical history.  History reviewed. No pertinent family history.  History  Substance Use Topics  . Smoking status: Current Everyday Smoker -- 0.5 packs/day    Types: Cigarettes  . Smokeless tobacco: Not on file  . Alcohol Use: Yes     social      Review of Systems    Constitutional: Negative for fever and chills.  Gastrointestinal: Positive for abdominal pain. Negative for nausea, vomiting and blood in stool.  Genitourinary: Positive for dysuria, hematuria and flank pain. Negative for frequency and incomplete emptying.  Neurological: Negative for dizziness and weakness.    Allergies  Review of patient's allergies indicates no known allergies.  Home Medications   Current Outpatient Rx  Name Route Sig Dispense Refill  . ALBUTEROL SULFATE HFA 108 (90 BASE) MCG/ACT IN AERS Inhalation Inhale 1-2 puffs into the lungs every 6 (six) hours as needed for wheezing. 1 Inhaler 1  . VITAMIN C 1000 MG PO TABS Oral Take 3,000 mg by mouth daily.    . CEPHALEXIN 500 MG PO CAPS Oral Take 1 capsule (500 mg total) by mouth 4 (four) times daily. 28 capsule 0  . HYDROCODONE-ACETAMINOPHEN 5-325 MG PO TABS Oral Take 1 tablet by mouth every 6 (six) hours as needed for pain. 15 tablet 0  . IBUPROFEN 200 MG PO TABS Oral Take 1,000 mg by mouth every 8 (eight) hours as needed. For pain    . ADULT MULTIVITAMIN W/MINERALS CH Oral Take 1 tablet by mouth daily.    Marland Kitchen CIPROFLOXACIN HCL 500 MG PO TABS Oral Take 1 tablet (500 mg total) by mouth every 12 (twelve) hours. 10 tablet 0  . OXYCODONE-ACETAMINOPHEN 5-325 MG PO TABS Oral Take 1-2 tablets by mouth every 6 (six) hours as needed for pain. 20 tablet 0    BP 116/73  Pulse 104  Temp(Src) 98.4 F (36.9 C) (Oral)  Resp  18  SpO2 93%  Physical Exam  Constitutional: He is oriented to person, place, and time. He appears well-developed and well-nourished.  HENT:  Head: Normocephalic.  Eyes: Pupils are equal, round, and reactive to light.  Neck: Normal range of motion.  Cardiovascular: Tachycardia present.   Pulmonary/Chest: Effort normal.  Abdominal: Soft. Bowel sounds are normal. He exhibits no distension. There is tenderness.    Genitourinary: Penis normal.  Musculoskeletal: Normal range of motion.  Neurological: He is alert  and oriented to person, place, and time.  Skin: Skin is warm and dry. No erythema.  Psychiatric: He has a normal mood and affect.    ED Course  Procedures (including critical care time)  Labs Reviewed  URINALYSIS, ROUTINE W REFLEX MICROSCOPIC - Abnormal; Notable for the following:    Color, Urine RED (*) BIOCHEMICALS MAY BE AFFECTED BY COLOR   APPearance TURBID (*)    Hgb urine dipstick LARGE (*)    Bilirubin Urine MODERATE (*)    Ketones, ur 15 (*)    Protein, ur 30 (*)    Nitrite POSITIVE (*)    Leukocytes, UA SMALL (*)    All other components within normal limits  URINE MICROSCOPIC-ADD ON - Abnormal; Notable for the following:    Bacteria, UA FEW (*)    All other components within normal limits  CBC  DIFFERENTIAL  PROTIME-INR  POCT I-STAT, CHEM 8  URINE CULTURE   Ct Abdomen Pelvis W Contrast  06/27/2011  *RADIOLOGY REPORT*  Clinical Data: Painful uric patient and hematuria.  The right back pain and right lower quadrant pain.  CT ABDOMEN AND PELVIS WITH CONTRAST  Technique:  Multidetector CT imaging of the abdomen and pelvis was performed following the standard protocol during bolus administration of intravenous contrast.  Contrast: OMNIPAQUE IOHEXOL 300 MG/ML IV SOLN  Comparison: 10/19/2010  Findings: Minimal dependent changes in the lung bases.  The liver, spleen, gallbladder, pancreas, adrenal glands, abdominal aorta, and retroperitoneal lymph nodes are unremarkable.  Stomach and small bowel are decompressed.  Scattered gas and stool in the colon without inflammatory change or distension.  No free air or free fluid in the abdomen.  Kidneys demonstrate small parenchymal cysts. No solid mass or hydronephrosis.  Pelvis:  Small left inguinal hernia containing fat.  Bladder wall is not thickened.  Diverticulosis of sigmoid colon without inflammatory change.  Prostate gland is not enlarged.  There are calcifications along the base of the base of the penis which may represent  vascular calcifications or punctate stones.  No free or loculated pelvic fluid collections.  The appendix is normal. Incidental note of an intramuscular lipoma in the right iliac this muscle.  Degenerative changes in the lumbar spine with normal alignment.  IMPRESSION: No focal acute process demonstrated in the abdomen or pelvis. Calcifications in the base of the penis might represent urethral stones or vascular calcifications.  Original Report Authenticated By: Marlon Pel, M.D.     1. Frank hematuria   2. Flank pain       MDM  CT abdomen and pelvis after speaking with Dr. Manson Passey with IV contrast to rule out kidney injury.  This is comforted by the fact that the patient has a history of kidney stones..   CT scan was reviewed by myself and Dr. Daun Peacock.  She placed a call to Dr. Morton Stall who recommends Cipro as an antibiotic.  Percocet as far as pain control, drinking plenty of fluids and follow up in the office on Monday  for cystoscopy for further evaluation of his hematuria      Arman Filter, NP 06/27/11 0403  Arman Filter, NP 06/27/11 0403

## 2011-06-28 LAB — URINE CULTURE
Colony Count: NO GROWTH
Culture  Setup Time: 201302101113
Culture: NO GROWTH

## 2011-07-21 ENCOUNTER — Encounter (HOSPITAL_COMMUNITY): Payer: Self-pay | Admitting: Emergency Medicine

## 2011-07-21 ENCOUNTER — Emergency Department (HOSPITAL_COMMUNITY)
Admission: EM | Admit: 2011-07-21 | Discharge: 2011-07-22 | Disposition: A | Discharge status: Home / Self Care | Payer: Self-pay | Attending: Emergency Medicine | Admitting: Emergency Medicine

## 2011-07-21 DIAGNOSIS — L03116 Cellulitis of left lower limb: Secondary | ICD-10-CM

## 2011-07-21 DIAGNOSIS — L02419 Cutaneous abscess of limb, unspecified: Secondary | ICD-10-CM | POA: Insufficient documentation

## 2011-07-21 DIAGNOSIS — M79609 Pain in unspecified limb: Secondary | ICD-10-CM | POA: Insufficient documentation

## 2011-07-21 DIAGNOSIS — J449 Chronic obstructive pulmonary disease, unspecified: Secondary | ICD-10-CM | POA: Insufficient documentation

## 2011-07-21 DIAGNOSIS — J4489 Other specified chronic obstructive pulmonary disease: Secondary | ICD-10-CM | POA: Insufficient documentation

## 2011-07-21 DIAGNOSIS — M7989 Other specified soft tissue disorders: Secondary | ICD-10-CM | POA: Insufficient documentation

## 2011-07-21 DIAGNOSIS — L02416 Cutaneous abscess of left lower limb: Secondary | ICD-10-CM

## 2011-07-21 MED ORDER — HYDROCODONE-ACETAMINOPHEN 5-325 MG PO TABS
1.0000 | ORAL_TABLET | Freq: Once | ORAL | Status: AC
  Administered 2011-07-21: 1 via ORAL

## 2011-07-21 MED ORDER — CLINDAMYCIN HCL 300 MG PO CAPS
300.0000 mg | ORAL_CAPSULE | Freq: Once | ORAL | Status: AC
  Administered 2011-07-21: 300 mg via ORAL
  Filled 2011-07-21: qty 1

## 2011-07-21 MED ORDER — HYDROCODONE-ACETAMINOPHEN 5-325 MG PO TABS: 2.0000 | ORAL_TABLET | ORAL | Status: AC | PRN

## 2011-07-21 MED ORDER — HYDROCODONE-ACETAMINOPHEN 5-325 MG PO TABS
1.0000 | ORAL_TABLET | Freq: Once | ORAL | Status: AC
  Administered 2011-07-21: 1 via ORAL
  Filled 2011-07-21 (×3): qty 1

## 2011-07-21 MED ORDER — CLINDAMYCIN HCL 150 MG PO CAPS: 300.0000 mg | ORAL_CAPSULE | Freq: Three times a day (TID) | ORAL | Status: DC

## 2011-07-21 MED ORDER — LIDOCAINE-EPINEPHRINE 2 %-1:100000 IJ SOLN
20.0000 mL | Freq: Once | INTRAMUSCULAR | Status: AC
  Administered 2011-07-21: 20 mL via INTRADERMAL

## 2011-07-21 NOTE — Discharge Instructions (Signed)
You were seen and treated today for a skin infection of your lower leg. You had an abscess that was drained by a small incision. Your providers today recommend that you have a recheck of your infection in the next 24-48 hours. You may return to the emergency room for a recheck. Please take the antibiotics you were prescribed as instructed for the full length of time. Return sooner if you have worsening of symptoms, fever, chills, nausea or vomiting.   Abscess An abscess (boil or furuncle) is an infected area that contains a collection of pus.  SYMPTOMS Signs and symptoms of an abscess include pain, tenderness, redness, or hardness. You may feel a moveable soft area under your skin. An abscess can occur anywhere in the body.  TREATMENT  A surgical cut (incision) may be made over your abscess to drain the pus. Gauze may be packed into the space or a drain may be looped through the abscess cavity (pocket). This provides a drain that will allow the cavity to heal from the inside outwards. The abscess may be painful for a few days, but should feel much better if it was drained.  Your abscess, if seen early, may not have localized and may not have been drained. If not, another appointment may be required if it does not get better on its own or with medications. HOME CARE INSTRUCTIONS   Only take over-the-counter or prescription medicines for pain, discomfort, or fever as directed by your caregiver.   Take your antibiotics as directed if they were prescribed. Finish them even if you start to feel better.   Keep the skin and clothes clean around your abscess.   If the abscess was drained, you will need to use gauze dressing to collect any draining pus. Dressings will typically need to be changed 3 or more times a day.   The infection may spread by skin contact with others. Avoid skin contact as much as possible.   Practice good hygiene. This includes regular hand washing, cover any draining skin  lesions, and do not share personal care items.   If you participate in sports, do not share athletic equipment, towels, whirlpools, or personal care items. Shower after every practice or tournament.   If a draining area cannot be adequately covered:   Do not participate in sports.   Children should not participate in day care until the wound has healed or drainage stops.   If your caregiver has given you a follow-up appointment, it is very important to keep that appointment. Not keeping the appointment could result in a much worse infection, chronic or permanent injury, pain, and disability. If there is any problem keeping the appointment, you must call back to this facility for assistance.  SEEK MEDICAL CARE IF:   You develop increased pain, swelling, redness, drainage, or bleeding in the wound site.   You develop signs of generalized infection including muscle aches, chills, fever, or a general ill feeling.   You have an oral temperature above 102 F (38.9 C).  MAKE SURE YOU:   Understand these instructions.   Will watch your condition.   Will get help right away if you are not doing well or get worse.  Document Released: 02/10/2005 Document Revised: 04/22/2011 Document Reviewed: 12/05/2007 [facility] Patient Information 2012 [place], [place].   Cellulitis Cellulitis is an infection of the skin and the tissue beneath it. The area is typically red and tender. It is caused by germs (bacteria) (usually staph or strep) that  enter the body through cuts or sores. Cellulitis most commonly occurs in the arms or lower legs.  HOME CARE INSTRUCTIONS   If you are given a prescription for medications which kill germs (antibiotics), take as directed until finished.   If the infection is on the arm or leg, keep the limb elevated as able.   Use a warm cloth several times per day to relieve pain and encourage healing.   See your caregiver for recheck of the infected site as directed if problems  arise.   Only take over-the-counter or prescription medicines for pain, discomfort, or fever as directed by your caregiver.  SEEK MEDICAL CARE IF:   The area of redness (inflammation) is spreading, there are red streaks coming from the infected site, or if a part of the infection begins to turn dark in color.   The joint or bone underneath the infected skin becomes painful after the skin has healed.   The infection returns in the same or another area after it seems to have gone away.   A boil or bump swells up. This may be an abscess.   New, unexplained problems such as pain or fever develop.  SEEK IMMEDIATE MEDICAL CARE IF:   You have a fever.   You or your child feels drowsy or lethargic.   There is vomiting, diarrhea, or lasting discomfort or feeling ill (malaise) with muscle aches and pains.  MAKE SURE YOU:   Understand these instructions.   Will watch your condition.   Will get help right away if you are not doing well or get worse.  Document Released: 02/10/2005 Document Revised: 04/22/2011 Document Reviewed: 12/20/2007 [facility] Patient Information 2012 [place], [place].    Community-Associated MRSA CA-MRSA stands for community-associated methicillin-resistant Staphylococcus aureus. MRSA is a type of bacteria that is resistant to some common antibiotics. It can cause infections in the skin and many other places in the body. Staphylococcus aureus, often called "staph," is a bacteria that normally lives on the skin or in the nose. Staph on the surface of the skin or in the nose does not cause problems. However, if the staph enters the body through a cut, wound, or break in the skin, an infection can happen. Up until recently, infections with the MRSA type of staph mainly occurred in hospitals and other healthcare settings. There are now increasing problems with MRSA infections in the community as well. Infections with MRSA may be very serious or even life-threatening. CA-MRSA  is becoming more common. It is known to spread in crowded settings, in jails and prisons, and in situations where there is close skin-to-skin contact, such as during sporting events or in locker rooms. MRSA can be spread through shared items, such as children's toys, razors, towels, or sports equipment.  CAUSES All staph, including MRSA, are normally harmless unless they enter the body through a scratch, cut, or wound, such as with surgery. All staph, including MRSA, can be spread from person-to-person by touching contaminated objects or through direct contact.  MRSA now causes illness in people who have not been in hospitals or other healthcare facilities. Cases of MRSA diseases in the community have been associated with:   Recent antibiotic use.   Sharing contaminated towels or clothes.   Having active skin diseases.   Participating in contact sports.   Living in crowded settings.   Intravenous (IV) drug use.   Community-associated MRSA infections are usually skin infections, but may cause other severe illnesses.   Staph bacteria are  one of the most common causes of skin infection. However, they are also a common cause of pneumonia, bone or joint infections, and bloodstream infections.  DIAGNOSIS Diagnosis of MRSA is done by cultures of fluid samples that may come from:  Swabs taken from cuts or wounds in infected areas.   Nasal swabs.   Saliva or deep cough specimens from the lungs (sputum).   Urine.   Blood.  Many people are "colonized" with MRSA but have no signs of infection. This means that people carry the MRSA germ on their skin or in their nose and may never develop MRSA infection.  TREATMENT  Treatment varies and is based on how serious, how deep, or how extensive the infection is. For example:  Some skin infections, such as a small boil or abscess, may be treated by draining yellowish-white fluid (pus) from the site of the infection.   Deeper or more widespread soft  tissue infections are usually treated with surgery to drain pus and with antibiotic medicine given by vein or by mouth. This may be recommended even if you are pregnant.   Serious infections may require a hospital stay.  If antibiotics are given, they may be needed for several weeks. PREVENTION Because many people are colonized with staph, including MRSA, preventing the spread of the bacteria from person-to-person is most important. The best way to prevent the spread of bacteria and other germs is through proper hand washing or by using alcohol-based hand disinfectants. The following are other ways to help prevent MRSA infection within community settings.   Wash your hands frequently with soap and water for at least 15 seconds. Otherwise, use alcohol-based hand disinfectants when soap and water is not available.   Make sure people who live with you wash their hands often, too.   Do not share personal items. For example, avoid sharing razors and other personal hygiene items, towels, clothing, and athletic equipment.   Wash and dry your clothes and bedding at the warmest temperatures recommended on the labels.   Keep wounds covered. Pus from infected sores may contain MRSA and other bacteria. Keep cuts and abrasions clean and covered with germ-free (sterile), dry bandages until they are healed.   If you have a wound that appears infected, ask your caregiver if a culture for MRSA and other bacteria should be done.   If you are breastfeeding, talk to your caregiver about MRSA. You may be asked to temporarily stop breastfeeding.  HOME CARE INSTRUCTIONS   Take your antibiotics as directed. Finish them even if you start to feel better.   Avoid close contact with those around you as much as possible. Do not use towels, razors, toothbrushes, bedding, or other items that will be used by others.   To fight the infection, follow your caregiver's instructions for wound care. Wash your hands before and  after changing your bandages.   If you have an intravascular device, such as a catheter, make sure you know how to care for it.   Be sure to tell any healthcare providers that you have MRSA so they are aware of your infection.  SEEK IMMEDIATE MEDICAL CARE IF:  The infection appears to be getting worse. Signs include:   Increased warmth, redness, or tenderness around the wound site.   A red line that extends from the infection site.   A dark color in the area around the infection.   Wound drainage that is tan, yellow, or green.   A bad smell coming  from the wound.   You feel sick to your stomach (nauseous) and throw up (vomit) or cannot keep medicine down.   You have a fever.   Your baby is older than 3 months with a rectal temperature of 102 F (38.9 C) or higher.   Your baby is 79 months old or younger with a rectal temperature of 100.4 F (38 C) or higher.   You have difficulty breathing.  MAKE SURE YOU:   Understand these instructions.   Will watch your condition.   Will get help right away if you are not doing well or get worse.  Document Released: 08/06/2005 Document Revised: 04/22/2011 Document Reviewed: 08/06/2010 [facility] Patient Information 2012 [place], [place].

## 2011-07-21 NOTE — ED Notes (Signed)
Pt states he has a place on his left leg that is red and swollen and painful  Pt states he had something similar about a couple of months ago  Pt has a large area noted on his left calf that is red, raised, and hot to touch

## 2011-07-21 NOTE — ED Provider Notes (Signed)
History     CSN: [id number]  Arrival date & time 07/21/11  2048   First MD Initiated Contact with Patient 07/21/11 2150      Chief Complaint  Patient presents with  . Cellulitis     HPI  History provided by the patient. Patient is a 56 year old male who is a current smoker with past history of COPD and basal cell carcinoma presents with complaints of increasing redness, swelling and pain to his left lower extremity the past 4 days. Symptoms began gradually and have been persistent. Pain is made worse with movements and palpation. Patient was concerned he may have an infection to the skin and states that he was trying to self medicate with leftover antibiotic pills. He is unsure of what kind of antibiotics these were. He denies having any improvement of symptoms. He denies having any bleeding or drainage from the area. Symptoms are described as severe. Patient does report having similar symptoms once before of skin. Patient has no other significant past medical history. He denies any other aggravating or alleviating factors. Patient denies heavy alcohol use or any IV drug use.    Past Medical History  Diagnosis Date  . COPD (chronic obstructive pulmonary disease)   . Basal cell carcinoma     History reviewed. No pertinent past surgical history.  Family History  Problem Relation Age of Onset  . Cancer Other     History  Substance Use Topics  . Smoking status: Current Everyday Smoker -- 0.5 packs/day    Types: Cigarettes  . Smokeless tobacco: Not on file  . Alcohol Use: Yes     social      Review of Systems  Constitutional: Negative for fever and chills.  Respiratory: Negative for shortness of breath.   Cardiovascular: Negative for chest pain.  All other systems reviewed and are negative.    Allergies  Review of patient's allergies indicates no known allergies.  Home Medications   Current Outpatient Rx  Name Route Sig Dispense Refill  . ALBUTEROL SULFATE HFA 108  (90 BASE) MCG/ACT IN AERS Inhalation Inhale 1-2 puffs into the lungs every 6 (six) hours as needed for wheezing. 1 Inhaler 1  . VITAMIN C 1000 MG PO TABS Oral Take 3,000 mg by mouth daily.    . IBUPROFEN 200 MG PO TABS Oral Take 1,000 mg by mouth every 8 (eight) hours as needed. For pain    . ADULT MULTIVITAMIN W/MINERALS CH Oral Take 1 tablet by mouth daily.      BP 121/73  Pulse 99  Temp(Src) 98.6 F (37 C) (Oral)  Resp 18  SpO2 96%  Physical Exam  Nursing note and vitals reviewed. Constitutional: He is oriented to person, place, and time. He appears well-developed and well-nourished. No distress.  HENT:  Head: Normocephalic.  Cardiovascular: Normal rate and regular rhythm.   Pulmonary/Chest: Effort normal and breath sounds normal.  Neurological: He is alert and oriented to person, place, and time.  Skin: Skin is warm.       2 cm area of fluctuance to the medial aspect of left calf. There is surrounding erythema and induration of skin. There is a secondary area of irregular skin texture to the medial aspect of left ankle and distal leg. There is also surrounding erythema and mild serous drainage.  Psychiatric: He has a normal mood and affect. His behavior is normal.    ED Course  Procedures   INCISION AND DRAINAGE Performed by: Angus Seller Consent: Verbal consent  obtained. Risks and benefits: risks, benefits and alternatives were discussed Type: abscess  Body area: Left lower leg  Anesthesia: local infiltration  Local anesthetic: lidocaine 2 % with epinephrine  Anesthetic total: 2 ml  Complexity: complex Blunt dissection to break up loculations  Drainage: purulent  Drainage amount: Moderate   Packing material: None   Patient tolerance: Patient tolerated the procedure well with no immediate complications.     1. Cutaneous abscess of left lower extremity   2. Cellulitis of left lower leg       MDM  9:50 PM patient seen and evaluated. Patient in no  acute distress.        Angus Seller, [place] 07/22/11 [phone]

## 2011-07-21 NOTE — ED Notes (Signed)
Suture cart and I&D tray at bedside 

## 2011-07-22 MED ORDER — HYDROCODONE-ACETAMINOPHEN 5-325 MG PO TABS
ORAL_TABLET | ORAL | Status: AC
  Filled 2011-07-22: qty 1

## 2011-07-22 NOTE — ED Provider Notes (Signed)
Medical screening examination/treatment/procedure(s) were performed by non-physician practitioner and as supervising physician I was immediately available for consultation/collaboration.   Glynn Octave, MD 07/22/11 1446

## 2011-07-26 ENCOUNTER — Encounter (HOSPITAL_COMMUNITY): Payer: Self-pay | Admitting: *Deleted

## 2011-07-26 ENCOUNTER — Emergency Department (HOSPITAL_COMMUNITY)
Admission: EM | Admit: 2011-07-26 | Discharge: 2011-07-27 | Disposition: A | Discharge status: Home / Self Care | Payer: Self-pay | Attending: Emergency Medicine | Admitting: Emergency Medicine

## 2011-07-26 DIAGNOSIS — R109 Unspecified abdominal pain: Secondary | ICD-10-CM | POA: Insufficient documentation

## 2011-07-26 DIAGNOSIS — IMO0002 Reserved for concepts with insufficient information to code with codable children: Secondary | ICD-10-CM | POA: Insufficient documentation

## 2011-07-26 DIAGNOSIS — R110 Nausea: Secondary | ICD-10-CM | POA: Insufficient documentation

## 2011-07-26 DIAGNOSIS — J4489 Other specified chronic obstructive pulmonary disease: Secondary | ICD-10-CM | POA: Insufficient documentation

## 2011-07-26 DIAGNOSIS — F1910 Other psychoactive substance abuse, uncomplicated: Secondary | ICD-10-CM | POA: Insufficient documentation

## 2011-07-26 DIAGNOSIS — F17200 Nicotine dependence, unspecified, uncomplicated: Secondary | ICD-10-CM | POA: Insufficient documentation

## 2011-07-26 DIAGNOSIS — J449 Chronic obstructive pulmonary disease, unspecified: Secondary | ICD-10-CM | POA: Insufficient documentation

## 2011-07-26 DIAGNOSIS — R000 Tachycardia, unspecified: Secondary | ICD-10-CM | POA: Insufficient documentation

## 2011-07-26 HISTORY — DX: Depression, unspecified: F32.A

## 2011-07-26 HISTORY — DX: Major depressive disorder, single episode, unspecified: F32.9

## 2011-07-26 LAB — POCT I-STAT, CHEM 8
BUN: 9 mg/dL (ref 6–23)
Chloride: 105 mEq/L (ref 96–112)
Creatinine, Ser: 1 mg/dL (ref 0.50–1.35)
Potassium: 4.2 mEq/L (ref 3.5–5.1)
Sodium: 141 mEq/L (ref 135–145)

## 2011-07-26 LAB — CBC
MCV: 95.6 fL (ref 78.0–100.0)
Platelets: 173 10*3/uL (ref 150–400)
RDW: 13 % (ref 11.5–15.5)
WBC: 8.6 10*3/uL (ref 4.0–10.5)

## 2011-07-26 LAB — RAPID URINE DRUG SCREEN, HOSP PERFORMED: Barbiturates: NOT DETECTED

## 2011-07-26 LAB — ETHANOL: Alcohol, Ethyl (B): <11 mg/dL (ref 0–11)

## 2011-07-26 MED ORDER — CLINDAMYCIN HCL 300 MG PO CAPS
300.0000 mg | ORAL_CAPSULE | Freq: Two times a day (BID) | ORAL | Status: DC
  Administered 2011-07-26 – 2011-07-27 (×2): 300 mg via ORAL
  Filled 2011-07-26 (×4): qty 1

## 2011-07-26 MED ORDER — ACETAMINOPHEN 325 MG PO TABS
650.0000 mg | ORAL_TABLET | ORAL | Status: DC | PRN
  Administered 2011-07-27: 650 mg via ORAL
  Filled 2011-07-26: qty 2

## 2011-07-26 MED ORDER — ALBUTEROL SULFATE HFA 108 (90 BASE) MCG/ACT IN AERS
2.0000 | INHALATION_SPRAY | RESPIRATORY_TRACT | Status: DC | PRN
  Filled 2011-07-26: qty 6.7

## 2011-07-26 MED ORDER — NICOTINE 21 MG/24HR TD PT24
21.0000 mg | MEDICATED_PATCH | Freq: Every day | TRANSDERMAL | Status: DC
  Administered 2011-07-26 – 2011-07-27 (×2): 21 mg via TRANSDERMAL
  Filled 2011-07-26 (×2): qty 1

## 2011-07-26 MED ORDER — ZOLPIDEM TARTRATE 5 MG PO TABS
5.0000 mg | ORAL_TABLET | Freq: Every evening | ORAL | Status: DC | PRN
  Administered 2011-07-26: 5 mg via ORAL
  Filled 2011-07-26: qty 1

## 2011-07-26 MED ORDER — LORAZEPAM 1 MG PO TABS
1.0000 mg | ORAL_TABLET | Freq: Three times a day (TID) | ORAL | Status: DC | PRN
  Administered 2011-07-26 – 2011-07-27 (×3): 1 mg via ORAL
  Filled 2011-07-26 (×3): qty 1

## 2011-07-26 MED ORDER — ALBUTEROL SULFATE (5 MG/ML) 0.5% IN NEBU
2.5000 mg | INHALATION_SOLUTION | Freq: Once | RESPIRATORY_TRACT | Status: AC
  Administered 2011-07-26: 2.5 mg via RESPIRATORY_TRACT
  Filled 2011-07-26: qty 0.5

## 2011-07-26 MED ORDER — IPRATROPIUM BROMIDE 0.02 % IN SOLN
0.5000 mg | Freq: Once | RESPIRATORY_TRACT | Status: AC
  Administered 2011-07-26: 0.5 mg via RESPIRATORY_TRACT
  Filled 2011-07-26: qty 2.5

## 2011-07-26 NOTE — ED Notes (Signed)
Pt given soda as requested.  

## 2011-07-26 NOTE — ED Notes (Signed)
Report given to [place], Johnney Ou. Pt to be moved to TCU 26.

## 2011-07-26 NOTE — ED Notes (Signed)
Pt here wanting help with detox from opiates. States snorts opitates, last use yesterday. States uses 4-5 packs per day. Denies SI/HI. Reports runny nose, watery eyes, stomach cramps.

## 2011-07-26 NOTE — ED Notes (Signed)
Spoke with Dr Effie Shy to request neb tx's for pt as pt states the albuterol inhaler isn't providing relief.. MD to order. Called respiratory to come and administer neb, respiratory on the way.

## 2011-07-26 NOTE — BH Assessment (Signed)
Assessment Note   Omar Wagner is a 56 y.o. male who presents to [facility] for detox from heroin.  Pt states has been using Heroin since 1990, pt has no previous SA tx hx.  Pt reports using 5 bags daily($100) and panhandles to get money for use.  Pt states he wants to quit so he can get a job---"I want to go back to work".  Pt has no criminal hx or court dates pending, denies SI/HI/Psych.  Pt has w/d sxs: nausea, chills, body aches, fatigue, indigestion, cramps, has no issues w/blackouts or seizures.  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    Axis I: Substance Abuse--Heroin  Axis II: Deferred Axis III:  Past Medical History  Diagnosis Date  . COPD (chronic obstructive pulmonary disease)   . Basal cell carcinoma   . Depression    Axis IV: economic problems, other psychosocial or environmental problems, problems related to social environment and problems with primary support group Axis V: 51-60 moderate symptoms  Past Medical History:  Past Medical History  Diagnosis Date  . COPD (chronic obstructive pulmonary disease)   . Basal cell carcinoma   . Depression     History reviewed. No pertinent past surgical history.  Family History:  Family History  Problem Relation Age of Onset  . Cancer Other     Social History:  reports that he has been smoking Cigarettes.  He has been smoking about .5 packs per day. He does not have any smokeless tobacco history on file. He reports that he drinks alcohol. He reports that he uses illicit drugs (Heroin).  Additional Social History:  Alcohol / Drug Use Pain Medications: None  Prescriptions: None  Over the Counter: None  Longest period of sobriety (when/how long): None  Negative Consequences of Use: Financial;Personal relationships;Work / School Withdrawal Symptoms: Nausea / Vomiting;Fever / Chills;Cramps;Sweats;Tremors;Other (Comment) (Body Aches, Fatigue ) Allergies: No Known Allergies  Home Medications:    Medications Prior to Admission  Medication Dose Route Frequency Provider Last Rate Last Dose  . acetaminophen (TYLENOL) tablet 650 mg  650 mg Oral Q4H PRN Rodena Medin, PA-C      . albuterol (PROVENTIL HFA;VENTOLIN HFA) 108 (90 BASE) MCG/ACT inhaler 2 puff  2 puff Inhalation Q4H PRN Flint Melter, MD      . clindamycin (CLEOCIN) capsule 300 mg  300 mg Oral BID Flint Melter, MD      . LORazepam (ATIVAN) tablet 1 mg  1 mg Oral Q8H PRN Rodena Medin, PA-C   1 mg at 07/26/11 1859  . nicotine (NICODERM CQ - dosed in mg/24 hours) patch 21 mg  21 mg Transdermal Daily Rodena Medin, PA-C   21 mg at 07/26/11 1406  . zolpidem (AMBIEN) tablet 5 mg  5 mg Oral QHS PRN Rodena Medin, PA-C       Medications Prior to Admission  Medication Sig Dispense Refill  . albuterol (PROVENTIL HFA;VENTOLIN HFA) 108 (90 BASE) MCG/ACT inhaler Inhale 1-2 puffs into the lungs every 6 (six) hours as needed for wheezing.  1 Inhaler  1  . Ascorbic Acid (VITAMIN C) 1000 MG tablet Take 3,000 mg by mouth daily.      . clindamycin (CLEOCIN) 150 MG capsule Take 300 mg by mouth 2 (two) times daily.      Marland Kitchen HYDROcodone-acetaminophen (NORCO) 5-325 MG per tablet Take 2 tablets by mouth every 4 (four) hours as needed for pain.  10 tablet  0  . ibuprofen (ADVIL,MOTRIN) 200 MG tablet Take 800-1,200 mg by mouth every 8 (eight) hours as needed. For pain      . Multiple Vitamin (MULITIVITAMIN WITH MINERALS) TABS Take 1 tablet by mouth daily.        OB/GYN Status:  No LMP for male patient.  General Assessment Data Location of Assessment: [facility] Living Arrangements: Alone Can pt return to current living arrangement?: Yes Admission Status: Voluntary Is patient capable of signing voluntary admission?: Yes Transfer from: Acute Hospital Referral Source: MD  Education Status Is patient currently in school?: No Current Grade: None  Highest grade of school patient has completed: Unk  Name of school: [employer]  Contact person: None    Risk to self Suicidal Ideation: No Suicidal Intent: No Is patient at risk for suicide?: No Suicidal Plan?: No Access to Means: No What has been your use of drugs/alcohol within the last 12 months?: Abusing Heroin  Previous Attempts/Gestures: No How many times?: 0  Other Self Harm Risks: None  Triggers for Past Attempts: None known Intentional Self Injurious Behavior: None Family Suicide History: No Recent stressful life event(s): Other (Comment) (Employment ) Persecutory voices/beliefs?: No Depression: Yes Depression Symptoms: Loss of interest in usual pleasures;Feeling worthless/self pity Substance abuse history and/or treatment for substance abuse?: Yes Suicide prevention information given to non-admitted patients: Not applicable  Risk to Others Homicidal Ideation: No Thoughts of Harm to Others: No Current Homicidal Intent: No Current Homicidal Plan: No Access to Homicidal Means: No Identified Victim: None  History of harm to others?: No Assessment of Violence: None Noted Violent Behavior Description: None  Does patient have access to weapons?: No Criminal Charges Pending?: No Does patient have a court date: No  Psychosis Hallucinations: None noted Delusions: None noted  Mental Status Report Appear/Hygiene: Disheveled;Poor hygiene Eye Contact: Poor Motor Activity: Unremarkable Speech: Logical/coherent Level of Consciousness: Alert Mood: Depressed;Sad;Anhedonia Affect: Depressed;Sad;Appropriate to circumstance Anxiety Level: None Thought Processes: Coherent;Relevant Judgement: Unimpaired Orientation: Person;Place;Time;Situation Obsessive Compulsive Thoughts/Behaviors: None  Cognitive Functioning Concentration: Normal Memory: Recent Intact;Remote Intact IQ: Average Insight: Fair Impulse Control: Fair Appetite: Fair Weight Loss: 0  Weight Gain: 0  Sleep: No Change Total Hours of Sleep: 8  Vegetative Symptoms: None  Prior Inpatient Therapy Prior  Inpatient Therapy: No Prior Therapy Dates: None  Prior Therapy Facilty/Provider(s): None  Reason for Treatment: None   Prior Outpatient Therapy Prior Outpatient Therapy: No Prior Therapy Dates: None  Prior Therapy Facilty/Provider(s): None  Reason for Treatment: None   ADL Screening (condition at time of admission) [name] cognitive ability adequate to safely complete daily activities?: Yes Patient able to express need for assistance with ADLs?: Yes Independently performs ADLs?: Yes Weakness of Legs: None Weakness of Arms/Hands: None       Abuse/Neglect Assessment (Assessment to be complete while patient is alone) Physical Abuse: Denies Verbal Abuse: Denies Sexual Abuse: Denies Exploitation of patient/patient's resources: Denies Self-Neglect: Denies Values / Beliefs Cultural Requests During Hospitalization: None Spiritual Requests During Hospitalization: None Consults Spiritual Care Consult Needed: No Social Work Consult Needed: No Merchant navy officer (For Healthcare) Advance Directive: Patient does not have advance directive;Patient would not like information Pre-existing out of facility DNR order (yellow form or pink MOST form): No    Additional Information 1:1 In Past 12 Months?: No CIRT Risk: No Elopement Risk: No Does patient have medical clearance?: Yes     Disposition:  Disposition Disposition of Patient: Referred to [facility]) Patient referred to: [facility]  On Site Evaluation by:  Reviewed with Physician:     Murrell Redden 07/26/2011 9:58 PM

## 2011-07-26 NOTE — ED Provider Notes (Signed)
History     CSN: [id number]  Arrival date & time 07/26/11  1216   First MD Initiated Contact with Patient 07/26/11 1303      Chief Complaint  Patient presents with  . Medical Clearance    (Consider location/radiation/quality/duration/timing/severity/associated sxs/prior treatment) Patient is a 56 y.o. male presenting with drug/alcohol assessment. The history is provided by the patient.  Drug / Alcohol Assessment Primary symptoms include agitation. Primary symptoms comment: He has nausea without vomtiing. Suspected agents include alcohol and heroin. Associated symptoms include nausea. Pertinent negatives include no fever. Associated symptoms comments: He reports treating chronic pain with heroin and now is requesting help with detox.. Associated medical issues include withdrawal syndrome.    Past Medical History  Diagnosis Date  . COPD (chronic obstructive pulmonary disease)   . Basal cell carcinoma     History reviewed. No pertinent past surgical history.  Family History  Problem Relation Age of Onset  . Cancer Other     History  Substance Use Topics  . Smoking status: Current Everyday Smoker -- 0.5 packs/day    Types: Cigarettes  . Smokeless tobacco: Not on file  . Alcohol Use: Yes     social      Review of Systems  Constitutional: Negative for fever and chills.  HENT: Negative.   Respiratory: Negative.   Cardiovascular: Negative.   Gastrointestinal: Positive for nausea and abdominal pain.  Musculoskeletal: Negative.   Skin: Negative.   Neurological: Negative.   Psychiatric/Behavioral: Positive for agitation.    Allergies  Review of patient's allergies indicates no known allergies.  Home Medications   Current Outpatient Rx  Name Route Sig Dispense Refill  . ALBUTEROL SULFATE HFA 108 (90 BASE) MCG/ACT IN AERS Inhalation Inhale 1-2 puffs into the lungs every 6 (six) hours as needed for wheezing. 1 Inhaler 1  . VITAMIN C 1000 MG PO TABS Oral Take 3,000 mg  by mouth daily.    Marland Kitchen CLINDAMYCIN HCL 150 MG PO CAPS Oral Take 300 mg by mouth 2 (two) times daily.    Marland Kitchen HYDROCODONE-ACETAMINOPHEN 5-325 MG PO TABS Oral Take 2 tablets by mouth every 4 (four) hours as needed for pain. 10 tablet 0  . IBUPROFEN 200 MG PO TABS Oral Take 800-1,200 mg by mouth every 8 (eight) hours as needed. For pain    . ADULT MULTIVITAMIN W/MINERALS CH Oral Take 1 tablet by mouth daily.      BP 147/93  Pulse 108  Temp(Src) 98.7 F (37.1 C) (Oral)  Resp 18  Ht 6' (1.829 m)  Wt 170 lb (77.111 kg)  BMI 23.06 kg/m2  SpO2 98%  Physical Exam  Constitutional: He appears well-developed and well-nourished.  HENT:  Head: Normocephalic.  Neck: Normal range of motion. Neck supple.  Cardiovascular: Regular rhythm.  Tachycardia present.   No murmur heard. Pulmonary/Chest: Effort normal and breath sounds normal.  Abdominal: Soft. Bowel sounds are normal. There is no tenderness. There is no rebound and no guarding.  Musculoskeletal: Normal range of motion.  Neurological: He is alert. No cranial nerve deficit.  Skin: Skin is warm and dry. No rash noted.  Psychiatric: He has a normal mood and affect.    ED Course  Procedures (including critical care time)  Labs Reviewed - No data to display No results found.   No diagnosis found.    MDM          Rodena Medin, PA-C 07/26/11 1336

## 2011-07-26 NOTE — ED Notes (Signed)
Spoke with Terri from ACT team to inform that pt does have rx of Cleocin in his belongings but doesn't have any more albuterol inhaler. Also informed her that pt had rec'd nebulizer treatments as his albuterol was not effective in easing his breathing symptoms. Pt currently resting.

## 2011-07-27 NOTE — Progress Notes (Signed)
Pt listed as self pay with no insurance coverage Pt confirms he is self pay [place] resident who has an appt with [facility] on 07/29/11 CM and [facility] coordinator spoke with him Pt offered [facility] services to assist with finding a [place] [employer] provider  Pt accepted information

## 2011-07-27 NOTE — Discharge Instructions (Signed)
Chemical Dependency Chemical dependency is an addiction to drugs or alcohol. It is characterized by the repeated behavior of seeking out and using drugs and alcohol despite harmful consequences to the health and safety of ones self and others.  RISK FACTORS There are certain situations or behaviors that increase a person's risk for chemical dependency. These include:  A family history of chemical dependency.   A history of mental health issues, including depression and anxiety.   A home environment where drugs and alcohol are easily available to you.   Drug or alcohol use at a young age.  SYMPTOMS  The following symptoms can indicate chemical dependency:  Inability to limit the use of drugs or alcohol.   Nausea, sweating, shakiness, and anxiety that occurs when alcohol or drugs are not being used.   An increase in amount of drugs or alcohol that is necessary to get drunk or high.  People who experience these symptoms can assess their use of drugs and alcohol by asking themselves the following questions:  Have you been told by friends or family that they are worried about your use of alcohol or drugs?   Do friends and family ever tell you about things you did while drinking alcohol or using drugs that you do not remember?   Do you lie about using alcohol or drugs or about the amounts you use?   Do you have difficulty completing daily tasks unless you use alcohol or drugs?   Is the level of your work or school performance lower because of your drug or alcohol use?   Do you get sick from using drugs or alcohol but keep using anyway?   Do you feel uncomfortable in social situations unless you use alcohol or drugs?   Do you use drugs or alcohol to help forget problems?  An answer of yes to any of these questions may indicate chemical dependency. Professional evaluation is suggested. Document Released: 04/27/2001 Document Revised: 04/22/2011 Document Reviewed: 07/09/2010 [facility]  Patient Information 2012 [place], [place].Drug Abuse, Frequently Asked Questions Drug addiction is a complex brain disease. It is characterized by compulsive, at times uncontrollable, drug craving, seeking, and use that persists even in the face of extremely negative results. Drug seeking becomes compulsive, in large part as a result of the effects of prolonged drug use on brain functioning and, thus, on behavior. For many people, drug addiction becomes chronic, with relapses possible even after long periods of being off the drug. HOW QUICKLY CAN I BECOME ADDICTED TO A DRUG? There is no easy answer to this. If and how quickly you might become addicted to a drug depends on many factors including the biology of your body. All drugs are potentially harmful and may have life-threatening consequences associated with their use. There are also vast differences among individuals in sensitivity to various drugs. While one person may use a drug many times and suffer no ill effects, another person may be particularly vulnerable and overdose or developing a craving with the first use. There is no way of knowing in advance how someone may react. HOW DO I KNOW IF SOMEONE IS ADDICTED TO DRUGS? If a person is compulsively seeking and using a drug despite negative consequences (such as loss of job, debt, physical problems brought on by drug abuse, or family problems) then he or she is probably addicted. Those who screen for drug problems, such as physicians, have developed the CAGE questionnaire. These four simple questions can help detect substance abuse problems:  Have  you ever felt you ought to Cut down on your drinking/drug use?   Have people ever Annoyed you by criticizing your drinking/drug use?   Have you ever felt bad or Guilty about your drinking/drug use?   Have you ever had a drink or taken a drug first thing in the morning to steady your nerves or get rid of a hangover (Eye-opener)?  WHAT ARE THE PHYSICAL  SIGNS OF ABUSE OR ADDICTION? The physical signs of abuse or addiction can vary depending on the person and the drug being abused. For example, someone who abuses marijuana may have a chronic cough or worsening of asthmatic conditions. THC, the chemical in marijuana responsible for producing its effects, is associated with weakening the immune system which makes the user more vulnerable to infections, such as pneumonia. Each drug has short-term and long-term physical effects. Stimulants like cocaine increase heart rate and blood pressure, whereas opioids like heroin may slow the heart rate and reduce breathing (respiration).  ARE THERE EFFECTIVE TREATMENTS FOR DRUG ADDICTION? Drug addiction can be effectively treated with behavioral-based therapies and, for addiction to some drugs such as heroin or nicotine, medications may be used. Treatment may vary for each person depending on the type of drug(s) being used and multiple courses of treatment may be needed to achieve success. Research has revealed 13 basic principles that underlie effective drug addiction treatment. These are discussed in NIDA's Principles of Drug Addiction Treatment: A Research-Based Guide. WHERE CAN I FIND INFORMATION ABOUT DRUG TREATMENT PROGRAMS?  For referrals to treatment programs, visit the Substance Abuse and Mental Health Services Administration online at http://findtreatment.http://gonzalez-rivas.net/.   NIDA publishes an expanding series of treatment manuals, the "clinical toolbox," that gives drug treatment providers research-based information for creating effective treatment programs.  WHAT IS DETOXIFICATION, OR "DETOX"? Detoxification is the process of allowing the body to rid itself of a drug while managing the symptoms of withdrawal. It is often the first step in a drug treatment program and should be followed by treatment with a behavioral-based therapy and/or a medication, if available. Detox alone with no follow-up is not treatment.    WHAT IS WITHDRAWAL? HOW LONG DOES IT LAST? Withdrawal is the variety of symptoms that occur after use of some addictive drugs is reduced or stopped. Length of withdrawal and symptoms vary with the type of drug. For example, physical symptoms of heroin withdrawal may include restlessness, muscle and bone pain, insomnia, diarrhea, vomiting, and cold flashes. These physical symptoms may last for several days, but the general depression, or dysphoria (opposite of euphoria), that often accompanies heroin withdrawal, may last for weeks. In many cases withdrawal can be easily treated with medications to ease the symptoms. But treating withdrawal is not the same as treating addiction.  WHAT ARE THE COSTS OF DRUG ABUSE TO SOCIETY? Beyond the raw numbers are other costs to society:  Spread of infectious diseases such as HIV/AIDS and hepatitis C either through sharing of drug paraphernalia or unprotected sex.   Deaths due to overdose or other complications from drug use.   Effects on unborn children of pregnant drug users.   Other effects such as crime and homelessness.  IF A PREGNANT WOMAN ABUSES DRUGS, DOES IT AFFECT THE FETUS?  Many substances including alcohol, nicotine, and drugs of abuse can have negative effects on the developing fetus because they are transferred to the fetus across the placenta. For example, nicotine has been connected with premature birth and low birth weight, as has the use of  cocaine. Scientific studies have shown that babies born to marijuana users were shorter, weighed less, and had smaller head sizes than those born to mothers who did not use the drug. Smaller babies are more likely to develop health problems.   Whether a baby's health problems, if caused by a drug, will continue as the child grows, is not always known. Research does show that children born to mothers who used marijuana regularly during pregnancy may have trouble concentrating, when older. Our research  continues to produce insights on the negative effects of drug use on the fetus.  Document Released: 05/06/2003 Document Revised: 04/22/2011 Document Reviewed: 08/02/2008 [facility] Patient Information 2012 [place], [place].Substance Abuse Your exam indicates that you have a problem with substance abuse. Substance abuse is the misuse of alcohol or drugs that causes problems in family life, friendships, and work relationships. Substance abuse is the most important cause of premature illness, disability, and death in our society. It is also the greatest threat to a person's mental and spiritual well being. Substance abuse can start out in an innocent way, such as social drinking or taking a little extra medication prescribed by your doctor. No one starts out with the intention of becoming an alcoholic or an addict. Substance abuse victims cannot control their use of alcohol or drugs. They may become intoxicated daily or go on weekend binges. Often there is a strong desire to quit, but attempts to stop using often fail. Encounters with law enforcement or conflicts with family members, friends, and work associates are signs of a potential problem. Recovery is always possible, although the craving for some drugs makes it difficult to quit without assistance. Many treatment programs are available to help people stop abusing alcohol or drugs. The first step in treatment is to admit you have a problem. This is a major hurdle because denial is a powerful force with substance abuse. Alcoholics Anonymous, Narcotics Anonymous, Cocaine Anonymous, and other recovery groups and programs can be very useful in helping people to quit. If you do not feel okay about your drug or alcohol use and if it is causing you trouble, we want to encourage you to talk about it with your doctor or with someone from a recovery group who can help you. You could also call the [employer] on Drug Abuse at 1-800-662-HELP. It is up to you to  take the first step. AL-ANON and ALA-TEEN are support groups for friends and family members of an alcohol or drug dependent person. The people who love and care for the alcoholic or addicted person often need help, too. For information about these organizations, check your phone directory or call a local alcohol or drug treatment center. Document Released: 06/10/2004 Document Revised: 04/22/2011 Document Reviewed: 05/04/2008 [facility] Patient Information 2012 [place], [place].

## 2011-07-27 NOTE — ED Provider Notes (Signed)
1:43 PM The patient was seen and evaluated by me and is in no apparent distress, is awake and alert, oriented appropriately, and is aware of the current treatment plan.  The patient makes no requests for any interventions or treatments otherwise at this time.  He is being discharged to the care of [facility] substance abuse treatment program.   Felisa Bonier, MD 07/27/11 1344

## 2011-08-12 NOTE — ED Provider Notes (Signed)
Evaluation and management procedures were performed by the PA/NP under my supervision/collaboration.   Felisa Bonier, MD 08/12/11 1012

## 2013-01-11 ENCOUNTER — Inpatient Hospital Stay (HOSPITAL_COMMUNITY)
Admission: EM | Admit: 2013-01-11 | Discharge: 2013-01-18 | DRG: 896 | Disposition: A | Discharge status: Home / Self Care | Payer: Self-pay | Attending: Internal Medicine | Admitting: Internal Medicine

## 2013-01-11 ENCOUNTER — Encounter (HOSPITAL_COMMUNITY): Payer: Self-pay | Admitting: Emergency Medicine

## 2013-01-11 ENCOUNTER — Emergency Department (HOSPITAL_COMMUNITY): Payer: Self-pay

## 2013-01-11 ENCOUNTER — Inpatient Hospital Stay (HOSPITAL_COMMUNITY): Payer: Self-pay

## 2013-01-11 DIAGNOSIS — J4489 Other specified chronic obstructive pulmonary disease: Secondary | ICD-10-CM | POA: Diagnosis present

## 2013-01-11 DIAGNOSIS — F17200 Nicotine dependence, unspecified, uncomplicated: Secondary | ICD-10-CM | POA: Diagnosis present

## 2013-01-11 DIAGNOSIS — F1120 Opioid dependence, uncomplicated: Secondary | ICD-10-CM | POA: Diagnosis present

## 2013-01-11 DIAGNOSIS — R4182 Altered mental status, unspecified: Secondary | ICD-10-CM

## 2013-01-11 DIAGNOSIS — R7881 Bacteremia: Secondary | ICD-10-CM | POA: Diagnosis present

## 2013-01-11 DIAGNOSIS — F1123 Opioid dependence with withdrawal: Secondary | ICD-10-CM

## 2013-01-11 DIAGNOSIS — T17908A Unspecified foreign body in respiratory tract, part unspecified causing other injury, initial encounter: Secondary | ICD-10-CM | POA: Diagnosis present

## 2013-01-11 DIAGNOSIS — I498 Other specified cardiac arrhythmias: Secondary | ICD-10-CM | POA: Diagnosis present

## 2013-01-11 DIAGNOSIS — J449 Chronic obstructive pulmonary disease, unspecified: Secondary | ICD-10-CM | POA: Diagnosis present

## 2013-01-11 DIAGNOSIS — F1910 Other psychoactive substance abuse, uncomplicated: Secondary | ICD-10-CM | POA: Diagnosis present

## 2013-01-11 DIAGNOSIS — R0682 Tachypnea, not elsewhere classified: Secondary | ICD-10-CM | POA: Diagnosis present

## 2013-01-11 DIAGNOSIS — Z79899 Other long term (current) drug therapy: Secondary | ICD-10-CM

## 2013-01-11 DIAGNOSIS — Z85828 Personal history of other malignant neoplasm of skin: Secondary | ICD-10-CM

## 2013-01-11 DIAGNOSIS — Z791 Long term (current) use of non-steroidal anti-inflammatories (NSAID): Secondary | ICD-10-CM

## 2013-01-11 DIAGNOSIS — F329 Major depressive disorder, single episode, unspecified: Secondary | ICD-10-CM | POA: Diagnosis present

## 2013-01-11 DIAGNOSIS — E860 Dehydration: Secondary | ICD-10-CM | POA: Diagnosis present

## 2013-01-11 DIAGNOSIS — R197 Diarrhea, unspecified: Secondary | ICD-10-CM | POA: Diagnosis present

## 2013-01-11 DIAGNOSIS — E876 Hypokalemia: Secondary | ICD-10-CM | POA: Diagnosis not present

## 2013-01-11 DIAGNOSIS — M7989 Other specified soft tissue disorders: Secondary | ICD-10-CM

## 2013-01-11 DIAGNOSIS — D696 Thrombocytopenia, unspecified: Secondary | ICD-10-CM | POA: Diagnosis present

## 2013-01-11 DIAGNOSIS — F1193 Opioid use, unspecified with withdrawal: Secondary | ICD-10-CM

## 2013-01-11 DIAGNOSIS — L02419 Cutaneous abscess of limb, unspecified: Secondary | ICD-10-CM | POA: Diagnosis present

## 2013-01-11 DIAGNOSIS — F19939 Other psychoactive substance use, unspecified with withdrawal, unspecified: Principal | ICD-10-CM | POA: Diagnosis present

## 2013-01-11 DIAGNOSIS — L03115 Cellulitis of right lower limb: Secondary | ICD-10-CM

## 2013-01-11 DIAGNOSIS — L03119 Cellulitis of unspecified part of limb: Secondary | ICD-10-CM | POA: Diagnosis present

## 2013-01-11 DIAGNOSIS — F3289 Other specified depressive episodes: Secondary | ICD-10-CM | POA: Diagnosis present

## 2013-01-11 DIAGNOSIS — F1111 Opioid abuse, in remission: Secondary | ICD-10-CM

## 2013-01-11 DIAGNOSIS — G9340 Encephalopathy, unspecified: Secondary | ICD-10-CM | POA: Diagnosis present

## 2013-01-11 DIAGNOSIS — B9689 Other specified bacterial agents as the cause of diseases classified elsewhere: Secondary | ICD-10-CM | POA: Diagnosis present

## 2013-01-11 LAB — CBC
HCT: 41 % (ref 39.0–52.0)
Hemoglobin: 13.5 g/dL (ref 13.0–17.0)
RBC: 4.2 MIL/uL — ABNORMAL LOW (ref 4.22–5.81)
RDW: 13.2 % (ref 11.5–15.5)
WBC: 10.7 10*3/uL — ABNORMAL HIGH (ref 4.0–10.5)

## 2013-01-11 LAB — BLOOD GAS, VENOUS
Bicarbonate: 29.6 mEq/L — ABNORMAL HIGH (ref 20.0–24.0)
pCO2, Ven: 45.9 mmHg (ref 45.0–50.0)
pH, Ven: 7.425 — ABNORMAL HIGH (ref 7.250–7.300)
pO2, Ven: 38.6 mmHg (ref 30.0–45.0)

## 2013-01-11 LAB — BASIC METABOLIC PANEL
BUN: 8 mg/dL (ref 6–23)
CO2: 29 mEq/L (ref 19–32)
Chloride: 98 mEq/L (ref 96–112)
GFR calc Af Amer: >90 mL/min (ref >90)
Glucose, Bld: 94 mg/dL (ref 70–99)
Potassium: 3.7 mEq/L (ref 3.5–5.1)

## 2013-01-11 LAB — HEPATIC FUNCTION PANEL
Alkaline Phosphatase: 105 U/L (ref 39–117)
Indirect Bilirubin: 0.6 mg/dL (ref 0.3–0.9)
Total Protein: 7.4 g/dL (ref 6.0–8.3)

## 2013-01-11 LAB — SALICYLATE LEVEL: Salicylate Lvl: <2 mg/dL — ABNORMAL LOW (ref 2.8–20.0)

## 2013-01-11 LAB — RAPID URINE DRUG SCREEN, HOSP PERFORMED
Amphetamines: NOT DETECTED
Barbiturates: NOT DETECTED
Benzodiazepines: POSITIVE — AB
Cocaine: NOT DETECTED

## 2013-01-11 LAB — ACETAMINOPHEN LEVEL: Acetaminophen (Tylenol), Serum: <15 ug/mL (ref 10–30)

## 2013-01-11 LAB — CG4 I-STAT (LACTIC ACID): Lactic Acid, Venous: 1.12 mmol/L (ref 0.5–2.2)

## 2013-01-11 LAB — TSH: TSH: 0.269 u[IU]/mL — ABNORMAL LOW (ref 0.350–4.500)

## 2013-01-11 LAB — MRSA PCR SCREENING: MRSA by PCR: NEGATIVE

## 2013-01-11 LAB — GLUCOSE, CAPILLARY: Glucose-Capillary: 94 mg/dL (ref 70–99)

## 2013-01-11 MED ORDER — SODIUM CHLORIDE 0.9 % IJ SOLN
3.0000 mL | Freq: Two times a day (BID) | INTRAMUSCULAR | Status: DC
  Administered 2013-01-11 – 2013-01-17 (×9): 3 mL via INTRAVENOUS

## 2013-01-11 MED ORDER — NALOXONE HCL 0.4 MG/ML IJ SOLN
INTRAMUSCULAR | Status: AC
  Filled 2013-01-11: qty 1

## 2013-01-11 MED ORDER — PIPERACILLIN-TAZOBACTAM 3.375 G IVPB
3.3750 g | Freq: Three times a day (TID) | INTRAVENOUS | Status: DC
  Administered 2013-01-11 – 2013-01-13 (×6): 3.375 g via INTRAVENOUS
  Filled 2013-01-11 (×8): qty 50

## 2013-01-11 MED ORDER — NALOXONE HCL 1 MG/ML IJ SOLN: 1.0000 mg | Freq: Once | INTRAMUSCULAR | Status: DC

## 2013-01-11 MED ORDER — VANCOMYCIN HCL IN DEXTROSE 1-5 GM/200ML-% IV SOLN
1000.0000 mg | Freq: Two times a day (BID) | INTRAVENOUS | Status: DC
  Administered 2013-01-11 – 2013-01-12 (×3): 1000 mg via INTRAVENOUS
  Filled 2013-01-11 (×3): qty 200

## 2013-01-11 MED ORDER — SODIUM CHLORIDE 0.9 % IV BOLUS (SEPSIS): 1000.0000 mL | Freq: Once | INTRAVENOUS | Status: DC

## 2013-01-11 MED ORDER — THIAMINE HCL 100 MG/ML IJ SOLN
100.0000 mg | Freq: Every day | INTRAMUSCULAR | Status: DC
  Administered 2013-01-11 – 2013-01-13 (×3): 100 mg via INTRAVENOUS
  Filled 2013-01-11 (×3): qty 1

## 2013-01-11 MED ORDER — NALOXONE HCL 1 MG/ML IJ SOLN: 2.0000 mg | Freq: Once | INTRAMUSCULAR | Status: DC

## 2013-01-11 MED ORDER — PNEUMOCOCCAL VAC POLYVALENT 25 MCG/0.5ML IJ INJ
0.5000 mL | INJECTION | INTRAMUSCULAR | Status: AC
  Administered 2013-01-12: 0.5 mL via INTRAMUSCULAR
  Filled 2013-01-11 (×2): qty 0.5

## 2013-01-11 MED ORDER — ONDANSETRON HCL 4 MG PO TABS: 4.0000 mg | ORAL_TABLET | Freq: Four times a day (QID) | ORAL | Status: DC | PRN

## 2013-01-11 MED ORDER — VANCOMYCIN HCL IN DEXTROSE 1-5 GM/200ML-% IV SOLN
1000.0000 mg | Freq: Once | INTRAVENOUS | Status: AC
  Administered 2013-01-11: 1000 mg via INTRAVENOUS
  Filled 2013-01-11: qty 200

## 2013-01-11 MED ORDER — ALBUTEROL SULFATE (5 MG/ML) 0.5% IN NEBU
2.5000 mg | INHALATION_SOLUTION | Freq: Four times a day (QID) | RESPIRATORY_TRACT | Status: DC
  Administered 2013-01-11 – 2013-01-15 (×12): 2.5 mg via RESPIRATORY_TRACT
  Filled 2013-01-11 (×13): qty 0.5

## 2013-01-11 MED ORDER — NALOXONE HCL 1 MG/ML IJ SOLN
1.0000 mg | Freq: Once | INTRAMUSCULAR | Status: AC
  Administered 2013-01-11: 1 mg via INTRAMUSCULAR
  Filled 2013-01-11: qty 2

## 2013-01-11 MED ORDER — SODIUM CHLORIDE 0.9 % IV SOLN
INTRAVENOUS | Status: DC
  Administered 2013-01-11 – 2013-01-15 (×5): via INTRAVENOUS

## 2013-01-11 MED ORDER — ACETAMINOPHEN 325 MG PO TABS
650.0000 mg | ORAL_TABLET | Freq: Four times a day (QID) | ORAL | Status: DC | PRN
  Administered 2013-01-12: 650 mg via ORAL
  Filled 2013-01-11: qty 2

## 2013-01-11 MED ORDER — ALBUTEROL SULFATE (5 MG/ML) 0.5% IN NEBU
2.5000 mg | INHALATION_SOLUTION | RESPIRATORY_TRACT | Status: DC | PRN
  Administered 2013-01-12 – 2013-01-14 (×2): 2.5 mg via RESPIRATORY_TRACT
  Filled 2013-01-11 (×2): qty 0.5

## 2013-01-11 MED ORDER — ONDANSETRON HCL 4 MG/2ML IJ SOLN: 4.0000 mg | Freq: Four times a day (QID) | INTRAMUSCULAR | Status: DC | PRN

## 2013-01-11 MED ORDER — ACETAMINOPHEN 650 MG RE SUPP: 650.0000 mg | Freq: Four times a day (QID) | RECTAL | Status: DC | PRN

## 2013-01-11 MED ORDER — PIPERACILLIN-TAZOBACTAM 3.375 G IVPB
3.3750 g | Freq: Once | INTRAVENOUS | Status: AC
  Administered 2013-01-11: 3.375 g via INTRAVENOUS
  Filled 2013-01-11: qty 50

## 2013-01-11 MED ORDER — SODIUM CHLORIDE 0.9 % IV BOLUS (SEPSIS)
1000.0000 mL | Freq: Once | INTRAVENOUS | Status: AC
  Administered 2013-01-11: 1000 mL via INTRAVENOUS

## 2013-01-11 MED ORDER — ENOXAPARIN SODIUM 40 MG/0.4ML ~~LOC~~ SOLN
40.0000 mg | SUBCUTANEOUS | Status: DC
  Administered 2013-01-11 – 2013-01-17 (×7): 40 mg via SUBCUTANEOUS
  Filled 2013-01-11 (×8): qty 0.4

## 2013-01-11 NOTE — Progress Notes (Signed)
*  Preliminary Results* Bilateral lower extremity venous duplex completed. Bilateral lower extremities are negative for deep vein thrombosis. There is no evidence of Baker's cyst bilaterally.  01/11/2013  Gertie Fey, RVT, RDCS, RDMS

## 2013-01-11 NOTE — Progress Notes (Signed)
ANTIBIOTIC CONSULT NOTE - INITIAL  Pharmacy Consult for vancomycin/zosyn Indication: cellulitis  No Known Allergies  Patient Measurements:   Adjusted Body Weight:   Vital Signs: Temp: 98 F (36.7 C) (08/28 0445) Temp src: Oral (08/28 0445) BP: 115/84 mmHg (08/28 0900) Pulse Rate: 66 (08/28 0900) Intake/Output from previous day:   Intake/Output from this shift:    Labs:  Recent Labs  01/11/13 0520  WBC 10.7*  HGB 13.5  PLT 164  CREATININE 0.65   The CrCl is unknown because both a height and weight (above a minimum accepted value) are required for this calculation. No results found for this basename: VANCOTROUGH, Leodis Binet, VANCORANDOM, GENTTROUGH, GENTPEAK, GENTRANDOM, TOBRATROUGH, TOBRAPEAK, TOBRARND, AMIKACINPEAK, AMIKACINTROU, AMIKACIN,  in the last 72 hours   Microbiology: Recent Results (from the past 720 hour(s))  MRSA PCR SCREENING     Status: None   Collection Time    01/11/13  9:23 AM      Result Value Range Status   MRSA by PCR NEGATIVE  NEGATIVE Final   Comment:            The GeneXpert MRSA Assay (FDA     approved for NASAL specimens     only), is one component of a     comprehensive MRSA colonization     surveillance program. It is not     intended to diagnose MRSA     infection nor to guide or     monitor treatment for     MRSA infections.    Medical History: Past Medical History  Diagnosis Date  . COPD (chronic obstructive pulmonary disease)   . Basal cell carcinoma   . Depression    Assessment: 10 YOM presents with altered mental status, he is being started on broad spectrum antibiotics for cellulitis which is recurrent.  8/28 >> vancomycin  >> 8/28 >>zosyn >>    Tmax: afebrile WBCs: 10.7 Renal: SCr = 0.65 for crcl =  >119ml/min (C-G/N)  8/28 blood: pending:   Goal of Therapy:  Vancomycin trough level 10-15 mcg/ml  Plan:   Zosyn 3.375gm IV q8h, each dose over 4h infusion  Vancomycin 1gm IV q12h  Check vancomycin trough  in 4-5 days if remains on vancomycin, sooner if change in renal function  Consider d/c zosyn if not complicated infection, such as ulcer, etc. (ie. If Gram-negative infection unlikely)  Juliette Alcide, PharmD, BCPS.   Pager: [phone]  01/11/2013,12:05 PM

## 2013-01-11 NOTE — ED Notes (Signed)
Attempted lab draw blood cultures x 2, but unsuccessful. RN., Autumn made aware. Notified lab to come and draw blood culture.

## 2013-01-11 NOTE — ED Notes (Signed)
Pt moved to Resus A, pt now less responsive, more confused. Pt's HR going as low as 32 with decreased respiratory effort. Pt withdraws to pain. Dr. Patria Mane at bedside, L EJ placed. Pt rambling, no coherent speech. Rhonchi noted with large amount of thick white sputum expelled.

## 2013-01-11 NOTE — ED Notes (Signed)
Pt does have weeping sores noted with pitting edema to bilat extremities. R arm noted to have several swollen red areas,

## 2013-01-11 NOTE — H&P (Signed)
[facility] History and Physical  EYTAN CARRIGAN AVW:[id number] DOB: 03/17/1956 DOA: 01/11/2013   PCP: Unknown Specialists: Unknown  Chief Complaint: Altered mental status  HPI: Omar Wagner is a 57 y.o. male with a past medical history of for depression and cellulitis of lower extremities, who was brought in by EMS after [employer] was called as the patient was trying to get into someone else's house. Patient apparently had a large amount of cash on person. There are reports that the patient mentioned that he had alcohol to drink last night. Patient was evaluated in the emergency department. He was given Narcan and he was noted to have bradycardia. He was also noted to be slightly tachypneic. Patient is very confused. He is unable to provide any history. He doesn't answer any questions, however, is arousable. He tries to resist examination. History is extremely limited at this time.  Home Medications: Prior to Admission medications   Medication Sig Start Date End Date Taking? Authorizing Provider  Ascorbic Acid (VITAMIN C) 1000 MG tablet Take 3,000 mg by mouth daily.   Yes Historical Provider, MD  Buprenorphine HCl-Naloxone HCl (SUBOXONE) 8-2 MG FILM Place 1 each under the tongue daily as needed. For pain   Yes Historical Provider, MD  ibuprofen (ADVIL,MOTRIN) 200 MG tablet Take 800-1,200 mg by mouth every 8 (eight) hours as needed. For pain   Yes Historical Provider, MD  Multiple Vitamin (MULITIVITAMIN WITH MINERALS) TABS Take 1 tablet by mouth daily.   Yes Historical Provider, MD    Allergies: No Known Allergies  Past Medical History: Past Medical History  Diagnosis Date  . COPD (chronic obstructive pulmonary disease)   . Basal cell carcinoma   . Depression     History reviewed. No pertinent past surgical history.  Social History:  reports that he has been smoking Cigarettes.  He has been smoking about 0.50 packs per day. He does not have any smokeless tobacco history on file. He  reports that  drinks alcohol. He reports that he uses illicit drugs (Heroin and IV).  Living Situation: Unknown Activity Level: Possibly independent at baseline, but unknown   Family History:  Unable to obtain due to AMS  Review of Systems - unable to obtain due to altered mental status  Physical Examination  Filed Vitals:   01/11/13 0615 01/11/13 0616 01/11/13 0656 01/11/13 0700  BP: 124/76 144/84 133/82 101/41  Pulse: 88 104 107 88  Temp:      TempSrc:      Resp: 26 30    SpO2: 97% 98% 98% 96%    General appearance: combative, distracted, no distress and slowed mentation Head: Normocephalic, without obvious abnormality, atraumatic Eyes: conjunctivae/corneas clear. PERRL,  Neck: no adenopathy, no carotid bruit, no JVD, supple, symmetrical, trachea midline and thyroid not enlarged, symmetric, no tenderness/mass/nodules. Soft and supple. Back: symmetric, no curvature. ROM normal. No CVA tenderness. Resp: Coarse breath sounds bilaterally. Few wheezes appreciated. No definite crackles. Cardio: regular rate and rhythm, S1, S2 normal, no murmur, click, rub or gallop GI: soft, non-tender; bowel sounds normal; no masses,  no organomegaly Extremities: Multiple erythematous lesions are noted on upper and lower extremities. His lower extremities are erythematous as well especially the right leg. No active drainage is noted from any of these lesions. Peripheral pulses are palpable. Pulses: 2+ and symmetric Skin: As noted above Lymph nodes: Slightly enlarged inguinal lymph nodes. Neurologic: He is distracted, combative. Doesn't answer any questions, but keeps saying "what?." Moving all his extremities. Not cooperative  with examination.  Laboratory Data: Results for orders placed during the hospital encounter of 01/11/13 (from the past 48 hour(s))  GLUCOSE, CAPILLARY     Status: None   Collection Time    01/11/13  4:55 AM      Result Value Range   Glucose-Capillary 94  70 - 99 mg/dL  CBC      Status: Abnormal   Collection Time    01/11/13  5:20 AM      Result Value Range   WBC 10.7 (*) 4.0 - 10.5 K/uL   RBC 4.20 (*) 4.22 - 5.81 MIL/uL   Hemoglobin 13.5  13.0 - 17.0 g/dL   HCT 16.1  09.6 - 04.5 %   MCV 97.6  78.0 - 100.0 fL   MCH 32.1  26.0 - 34.0 pg   MCHC 32.9  30.0 - 36.0 g/dL   RDW 40.9  81.1 - 91.4 %   Platelets 164  150 - 400 K/uL  BASIC METABOLIC PANEL     Status: None   Collection Time    01/11/13  5:20 AM      Result Value Range   Sodium 135  135 - 145 mEq/L   Potassium 3.7  3.5 - 5.1 mEq/L   Chloride 98  96 - 112 mEq/L   CO2 29  19 - 32 mEq/L   Glucose, Bld 94  70 - 99 mg/dL   BUN 8  6 - 23 mg/dL   Creatinine, Ser 7.82  0.50 - 1.35 mg/dL   Calcium 9.0  8.4 - 95.6 mg/dL   GFR calc non Af Amer >90  >90 mL/min   GFR calc Af Amer >90  >90 mL/min   Comment: (NOTE)     The eGFR has been calculated using the CKD EPI equation.     This calculation has not been validated in all clinical situations.     eGFR's persistently <90 mL/min signify possible Chronic Kidney     Disease.  ETHANOL     Status: None   Collection Time    01/11/13  5:20 AM      Result Value Range   Alcohol, Ethyl (B) <11  0 - 11 mg/dL   Comment:            LOWEST DETECTABLE LIMIT FOR     SERUM ALCOHOL IS 11 mg/dL     FOR MEDICAL PURPOSES ONLY  BLOOD GAS, VENOUS     Status: Abnormal   Collection Time    01/11/13  6:10 AM      Result Value Range   pH, Ven 7.425 (*) 7.250 - 7.300   pCO2, Ven 45.9  45.0 - 50.0 mmHg   pO2, Ven 38.6  30.0 - 45.0 mmHg   Bicarbonate 29.6 (*) 20.0 - 24.0 mEq/L   TCO2 26.1  0 - 100 mmol/L   Acid-Base Excess 4.8 (*) 0.0 - 2.0 mmol/L   O2 Saturation 73.4     Patient temperature 98.6     Collection site VEIN     Drawn by  Azalia Bilis, M.D.     Sample type VEIN    AMMONIA     Status: None   Collection Time    01/11/13  6:20 AM      Result Value Range   Ammonia 26  11 - 60 umol/L  CG4 I-STAT (LACTIC ACID)     Status: None   Collection Time    01/11/13   6:33 AM  Result Value Range   Lactic Acid, Venous 1.12  0.5 - 2.2 mmol/L  URINE RAPID DRUG SCREEN (HOSP PERFORMED)     Status: Abnormal   Collection Time    01/11/13  7:32 AM      Result Value Range   Opiates POSITIVE (*) NONE DETECTED   Cocaine NONE DETECTED  NONE DETECTED   Benzodiazepines POSITIVE (*) NONE DETECTED   Amphetamines NONE DETECTED  NONE DETECTED   Tetrahydrocannabinol NONE DETECTED  NONE DETECTED   Barbiturates NONE DETECTED  NONE DETECTED   Comment:            DRUG SCREEN FOR MEDICAL PURPOSES     ONLY.  IF CONFIRMATION IS NEEDED     FOR ANY PURPOSE, NOTIFY LAB     WITHIN 5 DAYS.                LOWEST DETECTABLE LIMITS     FOR URINE DRUG SCREEN     Drug Class       Cutoff (ng/mL)     Amphetamine      1000     Barbiturate      200     Benzodiazepine   200     Tricyclics       300     Opiates          300     Cocaine          300     THC              50    Radiology Reports: Ct Head Wo Contrast  01/11/2013   *RADIOLOGY REPORT*  Clinical Data: Altered mental status.  CT HEAD WITHOUT CONTRAST  Technique:  Contiguous axial images were obtained from the base of the skull through the vertex without contrast.  Comparison: Head CT 11/12/2005.  Findings: Patchy areas of decreased attenuation are noted throughout the deep and periventricular white matter of the cerebral hemispheres bilaterally, compatible with mild chronic microvascular ischemic disease. No acute intracranial abnormalities.  Specifically, no evidence of acute intracranial hemorrhage, no definite findings of acute/subacute cerebral ischemia, no mass, mass effect, hydrocephalus or abnormal intra or extra-axial fluid collections.  Visualized paranasal sinuses and mastoids are well pneumatized.  No acute displaced skull fractures are identified.  IMPRESSION: 1.  No acute intracranial abnormalities. 2.  Mild chronic microvascular ischemic changes in the cerebral white matter bilaterally.   Original Report  Authenticated By: Trudie Reed, M.D.   Dg Chest Portable 1 View  01/11/2013   *RADIOLOGY REPORT*  Clinical Data: Altered mental status  PORTABLE CHEST - 1 VIEW  Comparison: 03/30/2011  Findings: Reticulonodular interstitial prominence and mild peribronchial thickening.  Normal heart size.  Mild scattered atherosclerotic disease of the aorta.  No confluent airspace opacity, pleural effusion, or pneumothorax.  Advanced shoulder degenerative changes and rounded calcific densities projecting over the right scapula, may reflect loose bodies.  IMPRESSION: Interstitial prominence / peribronchial thickening may reflect chronic or acute on chronic bronchitic change.  No focal consolidation.   Original Report Authenticated By: Jearld Lesch, M.D.    Electrocardiogram: Sinus rhythm at 46 beats per minute. Normal axis. Intervals are normal. No definite Q waves. No concerning ST or T-wave changes noted. Subsequent rhythm on the monitor was sinus rhythm at 90 beats per minute  Problem List  Principal Problem:   Acute encephalopathy Active Problems:   Cellulitis of multiple sites of lower extremity   Assessment: This is a 57 year old Caucasian  male history of depression and cellulitis of his lower extremity was found to be confused. He still has acute encephalopathy. Etiology is unclear. The differential diagnosis is broad and includes drug-induced encephalopathy, secondary to infection. Alcohol level was normal. Other drug levels are pending. He is afebrile and central nervous system infection is considered unlikely at this time.  Plan: #1 acute encephalopathy: He is easily arousable by verbal stimuli. His vital signs are stable. However he does merit observation in the step down unit for at least the first 24 hours. Neurochecks will be ordered. Urine drug screen is positive for opiates and benzos. UA still pending as is LFT's. CT head was reassuringly without acute abnormalities. He does have some coarse  breath sounds with occasional wheezing. It's possible he may have aspirated. We will give him Nebulizer treatments. Chest x-ray does not show any pneumonia.  #2 cellulitis of the lower extremities: We will place him on broad-spectrum antibiotic coverage. He had similar presentation to the ED in March of 2013. We will also get venous Doppler studies.  #3 dehydration: Give him IV fluids. Continue to monitor urine output.  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 Continue to monitor.   DVT Prophylaxis: Lovenox Code Status: Full code Family Communication: No family at bedside  Disposition Plan: Admit to step down   Further management decisions will depend on results of further testing and patient's response to treatment.  [facility]  [facility] Pager [phone]  If 7PM-7AM, please contact night-coverage [email or web address] Password [email or web address]  01/11/2013, 8:21 AM

## 2013-01-11 NOTE — ED Provider Notes (Addendum)
CSN: [id number]     Arrival date & time 01/11/13  0436 History   First MD Initiated Contact with Patient 01/11/13 0455     Chief Complaint  Patient presents with  . Altered Mental Status   level V caveat: Altered mental status HPI Patient presents the emergency department after being found outside a woman's apartment.  He is brought to the emergency department because of some confusion and altered mental status as determined by police and EMS.  Patient has a history of substance abuse.  It's obvious that he has track marks on his arms.  The patient presents emergency department to get neck and tachycardic.  His no respiratory depression.  1 mg of Narcan was initiated however this did not change anything as would be expected.  CBG was normal.  Patient seemed to develop some worsening altered mental status while in the emergency department.  His rectum is 100.0.  He has evidence of cellulitis of his right lower extremity.  No frank abscess is present.  Patient is unable to provide any additional history.  He is arousable to voice and painful stimuli.  There is a report by triage nurse that he had admitted to drinking alcohol.  He would not admit this to me.   Past Medical History  Diagnosis Date  . COPD (chronic obstructive pulmonary disease)   . Basal cell carcinoma   . Depression    No past surgical history on file. Family History  Problem Relation Age of Onset  . Cancer Other    History  Substance Use Topics  . Smoking status: Current Every Day Smoker -- 0.50 packs/day    Types: Cigarettes  . Smokeless tobacco: Not on file  . Alcohol Use: Yes     Comment: social    Review of Systems  Unable to perform ROS   Allergies  Review of patient's allergies indicates no known allergies.  Home Medications   Current Outpatient Rx  Name  Route  Sig  Dispense  Refill  . Ascorbic Acid (VITAMIN C) 1000 MG tablet   Oral   Take 3,000 mg by mouth daily.         . Buprenorphine  HCl-Naloxone HCl (SUBOXONE) 8-2 MG FILM   Sublingual   Place 1 each under the tongue daily as needed. For pain         . ibuprofen (ADVIL,MOTRIN) 200 MG tablet   Oral   Take 800-1,200 mg by mouth every 8 (eight) hours as needed. For pain         . Multiple Vitamin (MULITIVITAMIN WITH MINERALS) TABS   Oral   Take 1 tablet by mouth daily.          BP 133/82  Pulse 107  Temp(Src) 98 F (36.7 C) (Oral)  Resp 30  SpO2 98% Physical Exam  Nursing note and vitals reviewed. Constitutional: He appears well-developed and well-nourished.  HENT:  Head: Normocephalic and atraumatic.  Eyes: EOM are normal.  Neck: Normal range of motion. Neck supple.  No meningeal signs  Cardiovascular: Regular rhythm, normal heart sounds and intact distal pulses.   Tachycardia  Pulmonary/Chest: Breath sounds normal.  Tachypnea  Abdominal: Soft. He exhibits no distension. There is no tenderness.  Genitourinary: Rectum normal.  Musculoskeletal: Normal range of motion.  Patient with evidence of track marks of his bilateral extremities including multiple areas of his right upper extremity.  Patient has evidence of significant erythema warmth and swelling of his right lower extremity consistent with a  right lower extreme cellulitis.  This extends from the right ankle up to the distal right knee.  No abscess of the right lower extremity is present.  Normal pulses in right foot.  Neurological:  Opens his eyes to voice and painful stimuli.  Follows very simple commands.  Moves all 4 extremities  Skin: Skin is warm and dry.  Psychiatric: He has a normal mood and affect. Judgment normal.    ED Course  Procedures (including critical care time) Labs Review   Date: 01/11/2013  Rate: 43  Rhythm: Bradycardia  QRS Axis: normal  Intervals: normal  ST/T Wave abnormalities: normal  Conduction Disutrbances: none  Narrative Interpretation:   Old EKG Reviewed:   Angiocath insertion Performed by: Lyanne Co Consent: Verbal consent obtained. Risks and benefits: risks, benefits and alternatives were discussed Time out: Immediately prior to procedure a "time out" was called to verify the correct patient, procedure, equipment, support staff and site/side marked as required. Preparation: Patient was prepped and draped in the usual sterile fashion. Vein Location: left EJ Gauge: 20 Normal blood return and flush without difficulty Patient tolerance: Patient tolerated the procedure well with no immediate complications.  Apiration of blood/fluid Performed by: Lyanne Co Consent obtained. Required items: required blood products, implants, devices, and special equipment available Patient identity confirmed: verbally with patient Time out: Immediately prior to procedure a "time out" was called to verify the correct patient, procedure, equipment, support staff and site/side marked as required. Preparation: Patient was prepped and draped in the usual sterile fashion. Patient tolerance: Patient tolerated the procedure well with no immediate complications. Location of aspiration: right EJ Blood obtained for blood culture and additional testing.  Nursing and phlebotomy unable to draw blood       Labs Reviewed  CBC - Abnormal; Notable for the following:    WBC 10.7 (*)    RBC 4.20 (*)    All other components within normal limits  BLOOD GAS, VENOUS - Abnormal; Notable for the following:    pH, Ven 7.425 (*)    Bicarbonate 29.6 (*)    Acid-Base Excess 4.8 (*)    All other components within normal limits  CULTURE, BLOOD (ROUTINE X 2)  CULTURE, BLOOD (ROUTINE X 2)  GLUCOSE, CAPILLARY  BASIC METABOLIC PANEL  ETHANOL  AMMONIA  URINE RAPID DRUG SCREEN (HOSP PERFORMED)  CG4 I-STAT (LACTIC ACID)   Imaging Review Ct Head Wo Contrast  01/11/2013   *RADIOLOGY REPORT*  Clinical Data: Altered mental status.  CT HEAD WITHOUT CONTRAST  Technique:  Contiguous axial images were obtained from the base of  the skull through the vertex without contrast.  Comparison: Head CT 11/12/2005.  Findings: Patchy areas of decreased attenuation are noted throughout the deep and periventricular white matter of the cerebral hemispheres bilaterally, compatible with mild chronic microvascular ischemic disease. No acute intracranial abnormalities.  Specifically, no evidence of acute intracranial hemorrhage, no definite findings of acute/subacute cerebral ischemia, no mass, mass effect, hydrocephalus or abnormal intra or extra-axial fluid collections.  Visualized paranasal sinuses and mastoids are well pneumatized.  No acute displaced skull fractures are identified.  IMPRESSION: 1.  No acute intracranial abnormalities. 2.  Mild chronic microvascular ischemic changes in the cerebral white matter bilaterally.   Original Report Authenticated By: Trudie Reed, M.D.   Dg Chest Portable 1 View  01/11/2013   *RADIOLOGY REPORT*  Clinical Data: Altered mental status  PORTABLE CHEST - 1 VIEW  Comparison: 03/30/2011  Findings: Reticulonodular interstitial prominence and mild peribronchial thickening.  Normal heart size.  Mild scattered atherosclerotic disease of the aorta.  No confluent airspace opacity, pleural effusion, or pneumothorax.  Advanced shoulder degenerative changes and rounded calcific densities projecting over the right scapula, may reflect loose bodies.  IMPRESSION: Interstitial prominence / peribronchial thickening may reflect chronic or acute on chronic bronchitic change.  No focal consolidation.   Original Report Authenticated By: Jearld Lesch, M.D.   I personally reviewed the imaging tests through PACS system I reviewed available ER/hospitalization records through the EMR   MDM   1. Cellulitis of right lower extremity   2. Altered mental status    7:38 AM Patient will [facility].  The patient is arousable and will answer simple questions and follow simple  commands.  Unclear etiology of his tachypnea at this time and is altered mental status..  Patient with a metabolic alkalosis.  Labs without significant abnormalities.  I discussed with the hospitalist it was determined that we should add on salicylates, acetaminophen level, LFTs.  Urine drug screen pending at this time.  Urinalysis pending.  Urine culture pending.    Lyanne Co, MD 01/11/13 1610  Lyanne Co, MD 01/11/13 [phone]

## 2013-01-11 NOTE — ED Notes (Signed)
Bed: WU98 Expected date:  Expected time:  Means of arrival:  Comments: EMS 57yo M, Disorientation

## 2013-01-11 NOTE — Progress Notes (Signed)
UR completed 

## 2013-01-11 NOTE — ED Notes (Signed)
Bed assigned...klj

## 2013-01-11 NOTE — ED Notes (Signed)
Pt in CT at this time.

## 2013-01-11 NOTE — Progress Notes (Signed)
Patient very sleepy and nonverbal-MyChart not activated at this time

## 2013-01-11 NOTE — Progress Notes (Signed)
[id number] Omar Plater, RN, BSN, CCM [phone] Chart Reviewed for discharge and hospital needs. Discharge needs at time of review:  None Review of patient progress due on [id number].

## 2013-01-11 NOTE — Progress Notes (Signed)
[facility] did not get to see patient but will be sending him information about the [facility] Card program, using the address provided.  °

## 2013-01-11 NOTE — ED Notes (Signed)
Per EMS pt found by [employer] attempting to gain entry into a house window. Homeowner does not know individual. Pt did admit to drinking etoh tonight. Pt found to have large amount of cash on person.

## 2013-01-12 DIAGNOSIS — T17908A Unspecified foreign body in respiratory tract, part unspecified causing other injury, initial encounter: Secondary | ICD-10-CM

## 2013-01-12 DIAGNOSIS — F1910 Other psychoactive substance abuse, uncomplicated: Secondary | ICD-10-CM

## 2013-01-12 LAB — CBC
HCT: 40.1 % (ref 39.0–52.0)
Hemoglobin: 13.6 g/dL (ref 13.0–17.0)
MCV: 94.8 fL (ref 78.0–100.0)
WBC: 11.8 10*3/uL — ABNORMAL HIGH (ref 4.0–10.5)

## 2013-01-12 LAB — URINALYSIS, ROUTINE W REFLEX MICROSCOPIC
Hgb urine dipstick: NEGATIVE
Nitrite: NEGATIVE
Protein, ur: NEGATIVE mg/dL
Specific Gravity, Urine: 1.025 (ref 1.005–1.030)
Urobilinogen, UA: 0.2 mg/dL (ref 0.0–1.0)

## 2013-01-12 LAB — COMPREHENSIVE METABOLIC PANEL
ALT: 11 U/L (ref 0–53)
Albumin: 2.6 g/dL — ABNORMAL LOW (ref 3.5–5.2)
BUN: 8 mg/dL (ref 6–23)
Calcium: 8.9 mg/dL (ref 8.4–10.5)
GFR calc Af Amer: >90 mL/min (ref >90)
Glucose, Bld: 102 mg/dL — ABNORMAL HIGH (ref 70–99)
Sodium: 138 mEq/L (ref 135–145)
Total Protein: 6.7 g/dL (ref 6.0–8.3)

## 2013-01-12 LAB — VANCOMYCIN, TROUGH: Vancomycin Tr: 9 ug/mL — ABNORMAL LOW (ref 10.0–20.0)

## 2013-01-12 MED ORDER — VANCOMYCIN HCL 10 G IV SOLR
1250.0000 mg | Freq: Two times a day (BID) | INTRAVENOUS | Status: DC
  Administered 2013-01-13: 1250 mg via INTRAVENOUS
  Filled 2013-01-12: qty 1250

## 2013-01-12 MED ORDER — VANCOMYCIN HCL 10 G IV SOLR
1500.0000 mg | Freq: Two times a day (BID) | INTRAVENOUS | Status: DC
  Filled 2013-01-12: qty 1500

## 2013-01-12 MED ORDER — GUAIFENESIN ER 600 MG PO TB12
600.0000 mg | ORAL_TABLET | Freq: Two times a day (BID) | ORAL | Status: DC
  Administered 2013-01-12 – 2013-01-18 (×12): 600 mg via ORAL
  Filled 2013-01-12 (×14): qty 1

## 2013-01-12 MED ORDER — FUROSEMIDE 10 MG/ML IJ SOLN
INTRAMUSCULAR | Status: AC
  Filled 2013-01-12: qty 8

## 2013-01-12 MED ORDER — VANCOMYCIN HCL 500 MG IV SOLR
500.0000 mg | Freq: Once | INTRAVENOUS | Status: AC
  Administered 2013-01-12: 500 mg via INTRAVENOUS
  Filled 2013-01-12: qty 500

## 2013-01-12 NOTE — Progress Notes (Signed)
Clinical Social Work Department BRIEF PSYCHOSOCIAL ASSESSMENT 01/12/2013  Patient:  Omar Wagner, Omar Wagner     Account Number:  [id number]     Admit date:  01/11/2013  Clinical Social Worker:  Dennison Bulla  Date/Time:  01/12/2013 12:00 N  Referred by:  Physician  Date Referred:  01/12/2013 Referred for  Substance Abuse   Other Referral:   Interview type:  Patient Other interview type:    PSYCHOSOCIAL DATA Living Status:  FRIEND(S) Admitted from facility:   Level of care:   Primary support name:  Thurston Hole Primary support relationship to patient:  SIBLING Degree of support available:   Lacking    CURRENT CONCERNS Current Concerns  Substance Abuse   Other Concerns:    SOCIAL WORK ASSESSMENT / PLAN CSW received referral in order to complete psychosocial assessment in order to assess for substance use. CSW reviewed chart and met with patient at bedside. CSW introduced myself and explained role.    Patient reports he has lived in [place] for awhile and is currently living with a friend. Patient reports he does not work and usually stays at home during the day. Patient reports that he is planning to [place] home with friend who will support him. Patient states he is not married and does not have children. Patient reports that other family is not very supportive.    CSW inquired about patient's substance use. Patient reports he drinks 1-2 beers about 2-3 days out of the week. Patient completed SBIRT and does not feel he is addicted to any substances. Patient reports that he usually drinks a beer because he enjoys the taste and his consumption is not related to any emotional aspects. Patient is aware of harmful side effects to substance use and is agreeable to stop use. Patient declined any resources.    CSW is signing off but available if further needs arise.   Assessment/plan status:  No Further Intervention Required Other assessment/ plan:   SBIRT   Information/referral to community  resources:   Patient declined any SA resources    [name]/FAMILY'S RESPONSE TO PLAN OF CARE: Patient alert and oriented but minimally engaged in assessment. Patient agreeable to complete SBIRT but declined any resources. Patient reports no further needs and states he will follow up with financial counselor regarding Medicaid.       (Coverage for Unice Bailey)

## 2013-01-12 NOTE — Progress Notes (Signed)
[facility] PROGRESS NOTE  Omar Wagner WGN:[id number] DOB: [birth year] DOA: 01/11/2013  PCP: Per Patient No Pcp  Brief HPI: Omar Wagner is a 57 y.o. male with a past medical history of depression and cellulitis of lower extremities, who was brought in by EMS after [employer] was called as the patient was trying to get into someone else's house. Patient apparently had a large amount of cash on person. There are reports that the patient mentioned that he had alcohol to drink last night. Patient was evaluated in the emergency department. He was given Narcan and he was noted to have bradycardia. He was also noted to be slightly tachypneic. Patient was very confused. He was unable to provide any history. He didn't answer any questions, however, was arousable. He was resisting examination.   Past medical history:  Past Medical History  Diagnosis Date  . COPD (chronic obstructive pulmonary disease)   . Basal cell carcinoma   . Depression     Consultants: None  Procedures: None  Antibiotics: IV Vanc 8/28--> Zosyn 8/28-->  Subjective: Patient admits to some shortness of breath. Denies any pain. Wondering why he is restraints. No complaints offered.  Objective: Vital Signs  Filed Vitals:   01/11/13 2000 01/11/13 2150 01/12/13 0000 01/12/13 0400  BP:  128/77 143/81 140/71  Pulse: 89 66 74 57  Temp: 98.8 F (37.1 C)  98.6 F (37 C) 99.1 F (37.3 C)  TempSrc: Oral  Oral Oral  Resp: 30 31 37 22  Height:      Weight:      SpO2: 94% 100% 95% 95%    Intake/Output Summary (Last 24 hours) at 01/12/13 0734 Last data filed at 01/12/13 0600  Gross per 24 hour  Intake   2375 ml  Output   1225 ml  Net   1150 ml   Filed Weights   01/11/13 1100  Weight: 72.4 kg (159 lb 9.8 oz)    General appearance: alert, cooperative and no distress Head: Normocephalic, without obvious abnormality, scar from previous ?moh's procedure noted Throat: lips, mucosa, and tongue normal; teeth and gums  normal and dry mm Resp: coarse breath sounds bilaterally. Decreased air entry at bases. Few scattered wheezes. Cardio: regular rate and rhythm, S1, S2 normal, no murmur, click, rub or gallop GI: soft, non-tender; bowel sounds normal; no masses,  no organomegaly Extremities: Improving erythema bilateral LE, less warm. Still with multiple wounds as before. No active drainage. Pulses: 2+ and symmetric Skin: Improving erythema lower extremities Neurologic: Alert. Oriented to Place, date, month, year. No focal deficits.  Lab Results:  Basic Metabolic Panel:  Recent Labs Lab 01/11/13 0520 01/12/13 0345  NA 135 138  K 3.7 4.0  CL 98 104  CO2 29 23  GLUCOSE 94 102*  BUN 8 8  CREATININE 0.65 0.61  CALCIUM 9.0 8.9   Liver Function Tests:  Recent Labs Lab 01/11/13 0620 01/12/13 0345  AST 28 31  ALT 12 11  ALKPHOS 105 94  BILITOT 0.8 0.7  PROT 7.4 6.7  ALBUMIN 3.1* 2.6*   No results found for this basename: LIPASE, AMYLASE,  in the last 168 hours  Recent Labs Lab 01/11/13 0620  AMMONIA 26   CBC:  Recent Labs Lab 01/11/13 0520 01/12/13 0345  WBC 10.7* 11.8*  HGB 13.5 13.6  HCT 41.0 40.1  MCV 97.6 94.8  PLT 164 183   Cardiac Enzymes: No results found for this basename: CKTOTAL, CKMB, CKMBINDEX, TROPONINI,  in the last 168  hours BNP (last 3 results) No results found for this basename: PROBNP,  in the last 8760 hours CBG:  Recent Labs Lab 01/11/13 0455  GLUCAP 94    Recent Results (from the past 240 hour(s))  MRSA PCR SCREENING     Status: None   Collection Time    01/11/13  9:23 AM      Result Value Range Status   MRSA by PCR NEGATIVE  NEGATIVE Final   Comment:            The GeneXpert MRSA Assay (FDA     approved for NASAL specimens     only), is one component of a     comprehensive MRSA colonization     surveillance program. It is not     intended to diagnose MRSA     infection nor to guide or     monitor treatment for     MRSA infections.       Studies/Results: Ct Head Wo Contrast  01/11/2013   *RADIOLOGY REPORT*  Clinical Data: Altered mental status.  CT HEAD WITHOUT CONTRAST  Technique:  Contiguous axial images were obtained from the base of the skull through the vertex without contrast.  Comparison: Head CT 11/12/2005.  Findings: Patchy areas of decreased attenuation are noted throughout the deep and periventricular white matter of the cerebral hemispheres bilaterally, compatible with mild chronic microvascular ischemic disease. No acute intracranial abnormalities.  Specifically, no evidence of acute intracranial hemorrhage, no definite findings of acute/subacute cerebral ischemia, no mass, mass effect, hydrocephalus or abnormal intra or extra-axial fluid collections.  Visualized paranasal sinuses and mastoids are well pneumatized.  No acute displaced skull fractures are identified.  IMPRESSION: 1.  No acute intracranial abnormalities. 2.  Mild chronic microvascular ischemic changes in the cerebral white matter bilaterally.   Original Report Authenticated By: Trudie Reed, M.D.   Dg Chest Port 1 View  01/11/2013   *RADIOLOGY REPORT*  Clinical Data: Chest pain.  Hypoxemia.  Smoker with current history of COPD.  PORTABLE CHEST - 1 VIEW 01/11/2013 1418 hours:  Comparison: Portable chest x-ray earlier same date 0625 hours.  Two- view chest x-ray 03/30/2011, 03/29/2007.  Findings: Cardiac silhouette normal in size for technique and degree of inspiration.  Prominent bronchovascular markings diffusely and central peribronchial thickening, unchanged since earlier in the day but more prominent than on the prior examinations.  No new pulmonary parenchymal abnormalities.  Note again made of multiple calcified loose bodies within the right shoulder joint.  IMPRESSION: Acute superimposed upon chronic bronchitis, stable since earlier in the day.  No evidence of airspace pneumonia or new abnormalities since earlier in the day.   Original Report  Authenticated By: Hulan Saas, M.D.   Dg Chest Portable 1 View  01/11/2013   *RADIOLOGY REPORT*  Clinical Data: Altered mental status  PORTABLE CHEST - 1 VIEW  Comparison: 03/30/2011  Findings: Reticulonodular interstitial prominence and mild peribronchial thickening.  Normal heart size.  Mild scattered atherosclerotic disease of the aorta.  No confluent airspace opacity, pleural effusion, or pneumothorax.  Advanced shoulder degenerative changes and rounded calcific densities projecting over the right scapula, may reflect loose bodies.  IMPRESSION: Interstitial prominence / peribronchial thickening may reflect chronic or acute on chronic bronchitic change.  No focal consolidation.   Original Report Authenticated By: Jearld Lesch, M.D.    Medications:  Scheduled: . albuterol  2.5 mg Nebulization Q6H  . enoxaparin (LOVENOX) injection  40 mg Subcutaneous Q24H  . furosemide      .  guaiFENesin  600 mg Oral BID  . piperacillin-tazobactam (ZOSYN)  IV  3.375 g Intravenous Q8H  . pneumococcal 23 valent vaccine  0.5 mL Intramuscular Tomorrow-1000  . sodium chloride  3 mL Intravenous Q12H  . thiamine  100 mg Intravenous Daily  . vancomycin  1,000 mg Intravenous Q12H   Continuous: . sodium chloride 75 mL/hr at 01/11/13 1409   ZOX:[id number], acetaminophen, albuterol, ondansetron (ZOFRAN) IV, ondansetron  Assessment/Plan:  Principal Problem:   Acute encephalopathy Active Problems:   Cellulitis of multiple sites of lower extremity   Aspiration into airway   Substance abuse    Acute encephalopathy Appears to have resolved. Etiology likely multifactorial including infection, drugs. He is now awake and oriented. Can discontinue restraints. Mobilize. Await UA. LFT are normal. CT head was reassuringly without acute abnormalities.   Possible Aspiration This might have happened when he was unresponsive. No infiltrate on CXR. Continue Nebulizer treatments. Mucinex. Mobilize.  Cellulitis  of the lower extremities Seems to be improving. Continue current antibiotics. No DVT on Venous Doppler studies.   Dehydration Improving with IVF. Continue to monitor urine output. Advance diet with aspiration precautions.  History of substance abuse He denies recent use. He does have a history of heroin abuse and he is on Suboxone based on his medication list. Continue to monitor. Consult SW.  DVT Prophylaxis: Lovenox  Code Status: Full code  Family Communication: No family at bedside. Discussed with patient.  Disposition Plan: Possible transfer to tele floor later today.     LOS: 1 day   [facility]  [facility] Pager [phone] 01/12/2013, 7:34 AM  If 8PM-8AM, please contact night-coverage at [email or web address], password [facility]

## 2013-01-12 NOTE — Progress Notes (Signed)
ANTIBIOTIC CONSULT NOTE - Follow-up  Pharmacy Consult for vancomycin Indication: cellulitis  Assessment: 49 YOM presents with altered mental status, he is being started on broad spectrum antibiotics for cellulitis which is recurrent.  8/28 >> vancomycin  >> 8/28 >>zosyn >>    Tmax: 100.2 WBCs: 11.8 Renal: SCr = 0.61 for crcl =  >191ml/min (C-G/N)  8/28 blood x2: ngtd 8/28 MRSA PCR: negative 8/29 C.diff PCR: in process  Vancomycin trough 8/29 low at 9.0  Goal of Therapy:  Vancomycin trough level 10-15 mcg/ml  Plan:   Continue Zosyn 3.375gm IV q8h, each dose over 4h infusion  Give supplemental Vancomycin dose of 500mg  IV now  Increase vancomycin to 1250mg  IV q12h starting 8/30 at 0500  Consider d/c zosyn if not complicated infection, such as ulcer, etc. (ie. If Gram-negative infection unlikely)  Thank you for the consult.  Omar Wagner, PharmD Clinical Pharmacist Pager: [phone] Pharmacy: [phone] 01/12/2013 6:28 PM    No Known Allergies  Patient Measurements: Height: 6' (182.9 cm) Weight: 160 lb 4.4 oz (72.7 kg) IBW/kg (Calculated) : 77.6   Vital Signs: Temp: 97.5 F (36.4 C) (08/29 1700) Temp src: Oral (08/29 1700) BP: 138/91 mmHg (08/29 1700) Pulse Rate: 50 (08/29 1700) Intake/Output from previous day: 08/28 0701 - 08/29 0700 In: 2375 [I.V.:1825; IV Piggyback:550] Out: 1225 [Urine:1225] Intake/Output from this shift: Total I/O In: 800 [I.V.:750; IV Piggyback:50] Out: 500 [Urine:500]  Labs:  Recent Labs  01/11/13 0520 01/12/13 0345  WBC 10.7* 11.8*  HGB 13.5 13.6  PLT 164 183  CREATININE 0.65 0.61   Estimated Creatinine Clearance: 102.2 ml/min (by C-G formula based on Cr of 0.61).  Recent Labs  01/12/13 1734  VANCOTROUGH 9.0*     Microbiology: Recent Results (from the past 720 hour(s))  CULTURE, BLOOD (ROUTINE X 2)     Status: None   Collection Time    01/11/13  6:20 AM      Result Value Range Status   Specimen  Description BLOOD LEFT NECK   Final   Special Requests BOTTLES DRAWN AEROBIC AND ANAEROBIC 3CC   Final   Culture  Setup Time     Final   Value: 01/11/2013 10:00     Performed at [facility]   Culture     Final   Value:        BLOOD CULTURE RECEIVED NO GROWTH TO DATE CULTURE WILL BE HELD FOR 5 DAYS BEFORE ISSUING A FINAL NEGATIVE REPORT     Performed at [facility]   Report Status PENDING   Incomplete  CULTURE, BLOOD (ROUTINE X 2)     Status: None   Collection Time    01/11/13  7:10 AM      Result Value Range Status   Specimen Description BLOOD LEFT NECK   Final   Special Requests BOTTLES DRAWN AEROBIC AND ANAEROBIC 3CC   Final   Culture  Setup Time     Final   Value: 01/11/2013 13:03     Performed at [facility]   Culture     Final   Value:        BLOOD CULTURE RECEIVED NO GROWTH TO DATE CULTURE WILL BE HELD FOR 5 DAYS BEFORE ISSUING A FINAL NEGATIVE REPORT     Performed at [facility]   Report Status PENDING   Incomplete  MRSA PCR SCREENING     Status: None   Collection Time    01/11/13  9:23 AM  Result Value Range Status   MRSA by PCR NEGATIVE  NEGATIVE Final   Comment:            The GeneXpert MRSA Assay (FDA     approved for NASAL specimens     only), is one component of a     comprehensive MRSA colonization     surveillance program. It is not     intended to diagnose MRSA     infection nor to guide or     monitor treatment for     MRSA infections.    Medical History: Past Medical History  Diagnosis Date  . COPD (chronic obstructive pulmonary disease)   . Basal cell carcinoma   . Depression

## 2013-01-12 NOTE — Consult Note (Signed)
WOC consult Note Reason for Consult: Resolving cellulitis to bilateral lower extremities Wound type: resolving cellulitis Measurement: Scattered areas of scabbed and scaly lesions.  Wound bed:Intact Drainage (amount, consistency, odor)  None Periwound: Erythematous Dressing procedure/placement/frequency: Resolving cellulitis.  No topical treatment needed at this time.  Will not follow at this time.  Please re-consult if needed.  Maple Hudson RN BSN [facility] Pager [phone]

## 2013-01-13 DIAGNOSIS — R197 Diarrhea, unspecified: Secondary | ICD-10-CM | POA: Diagnosis not present

## 2013-01-13 DIAGNOSIS — E876 Hypokalemia: Secondary | ICD-10-CM | POA: Diagnosis not present

## 2013-01-13 LAB — CBC
Hemoglobin: 14.3 g/dL (ref 13.0–17.0)
MCHC: 33.6 g/dL (ref 30.0–36.0)
Platelets: 204 10*3/uL (ref 150–400)
RBC: 4.46 MIL/uL (ref 4.22–5.81)
RDW: 13 % (ref 11.5–15.5)
WBC: 13.8 10*3/uL — ABNORMAL HIGH (ref 4.0–10.5)

## 2013-01-13 LAB — BASIC METABOLIC PANEL
BUN: 6 mg/dL (ref 6–23)
BUN: 6 mg/dL (ref 6–23)
Calcium: 8.4 mg/dL (ref 8.4–10.5)
Creatinine, Ser: 0.56 mg/dL (ref 0.50–1.35)
Creatinine, Ser: 0.52 mg/dL (ref 0.50–1.35)
GFR calc Af Amer: >90 mL/min (ref >90)
GFR calc Af Amer: >90 mL/min (ref >90)
GFR calc non Af Amer: >90 mL/min (ref >90)
Glucose, Bld: 101 mg/dL — ABNORMAL HIGH (ref 70–99)
Potassium: 3.1 mEq/L — ABNORMAL LOW (ref 3.5–5.1)

## 2013-01-13 LAB — MAGNESIUM: Magnesium: 1.7 mg/dL (ref 1.5–2.5)

## 2013-01-13 LAB — CLOSTRIDIUM DIFFICILE BY PCR: Toxigenic C. Difficile by PCR: NEGATIVE

## 2013-01-13 MED ORDER — LOPERAMIDE HCL 2 MG PO CAPS
2.0000 mg | ORAL_CAPSULE | ORAL | Status: DC | PRN
  Administered 2013-01-14 – 2013-01-15 (×2): 2 mg via ORAL
  Filled 2013-01-13 (×2): qty 1

## 2013-01-13 MED ORDER — MORPHINE SULFATE 2 MG/ML IJ SOLN
1.0000 mg | Freq: Once | INTRAMUSCULAR | Status: AC
  Administered 2013-01-13: 1 mg via INTRAVENOUS
  Filled 2013-01-13: qty 1

## 2013-01-13 MED ORDER — MAGNESIUM SULFATE 40 MG/ML IJ SOLN
2.0000 g | Freq: Once | INTRAMUSCULAR | Status: AC
  Administered 2013-01-13: 2 g via INTRAVENOUS
  Filled 2013-01-13: qty 50

## 2013-01-13 MED ORDER — LOPERAMIDE HCL 2 MG PO CAPS: 4.0000 mg | ORAL_CAPSULE | Freq: Three times a day (TID) | ORAL | Status: DC | PRN

## 2013-01-13 MED ORDER — CLONIDINE HCL 0.1 MG PO TABS
0.1000 mg | ORAL_TABLET | Freq: Four times a day (QID) | ORAL | Status: AC
  Administered 2013-01-13 – 2013-01-15 (×10): 0.1 mg via ORAL
  Filled 2013-01-13 (×10): qty 1

## 2013-01-13 MED ORDER — METHOCARBAMOL 500 MG PO TABS
500.0000 mg | ORAL_TABLET | Freq: Three times a day (TID) | ORAL | Status: DC | PRN
  Administered 2013-01-13: 500 mg via ORAL
  Filled 2013-01-13 (×3): qty 1

## 2013-01-13 MED ORDER — POTASSIUM CHLORIDE CRYS ER 20 MEQ PO TBCR
40.0000 meq | EXTENDED_RELEASE_TABLET | Freq: Once | ORAL | Status: AC
  Administered 2013-01-13: 40 meq via ORAL
  Filled 2013-01-13: qty 2

## 2013-01-13 MED ORDER — DICYCLOMINE HCL 20 MG PO TABS
20.0000 mg | ORAL_TABLET | Freq: Four times a day (QID) | ORAL | Status: DC | PRN
  Administered 2013-01-14 – 2013-01-15 (×2): 20 mg via ORAL
  Filled 2013-01-13 (×2): qty 1

## 2013-01-13 MED ORDER — CLONIDINE HCL 0.1 MG PO TABS
0.1000 mg | ORAL_TABLET | ORAL | Status: AC
  Administered 2013-01-16 – 2013-01-17 (×4): 0.1 mg via ORAL
  Filled 2013-01-13 (×4): qty 1

## 2013-01-13 MED ORDER — VITAMIN B-1 100 MG PO TABS
100.0000 mg | ORAL_TABLET | Freq: Every day | ORAL | Status: DC
  Administered 2013-01-13: 100 mg via ORAL
  Filled 2013-01-13: qty 1

## 2013-01-13 MED ORDER — LOPERAMIDE HCL 2 MG PO CAPS
4.0000 mg | ORAL_CAPSULE | Freq: Once | ORAL | Status: AC
  Administered 2013-01-13: 4 mg via ORAL
  Filled 2013-01-13: qty 2

## 2013-01-13 MED ORDER — ONDANSETRON 4 MG PO TBDP
4.0000 mg | ORAL_TABLET | Freq: Four times a day (QID) | ORAL | Status: DC | PRN
  Administered 2013-01-13: 4 mg via ORAL
  Filled 2013-01-13: qty 1

## 2013-01-13 MED ORDER — DOXYCYCLINE HYCLATE 100 MG PO TABS
100.0000 mg | ORAL_TABLET | Freq: Two times a day (BID) | ORAL | Status: DC
  Administered 2013-01-13 – 2013-01-18 (×11): 100 mg via ORAL
  Filled 2013-01-13 (×12): qty 1

## 2013-01-13 MED ORDER — OXYCODONE HCL 5 MG PO TABS
5.0000 mg | ORAL_TABLET | Freq: Four times a day (QID) | ORAL | Status: DC | PRN
  Administered 2013-01-13: 5 mg via ORAL
  Filled 2013-01-13: qty 1

## 2013-01-13 MED ORDER — HYDROXYZINE HCL 25 MG PO TABS
25.0000 mg | ORAL_TABLET | Freq: Four times a day (QID) | ORAL | Status: DC | PRN
  Administered 2013-01-13 – 2013-01-16 (×2): 25 mg via ORAL
  Filled 2013-01-13 (×2): qty 1

## 2013-01-13 MED ORDER — LEVOFLOXACIN 500 MG PO TABS
500.0000 mg | ORAL_TABLET | Freq: Every day | ORAL | Status: DC
  Administered 2013-01-13 – 2013-01-17 (×5): 500 mg via ORAL
  Filled 2013-01-13 (×6): qty 1

## 2013-01-13 MED ORDER — POTASSIUM CHLORIDE 10 MEQ/100ML IV SOLN
10.0000 meq | INTRAVENOUS | Status: AC
  Administered 2013-01-13 (×4): 10 meq via INTRAVENOUS
  Filled 2013-01-13 (×4): qty 100

## 2013-01-13 MED ORDER — CLONIDINE HCL 0.1 MG PO TABS
0.1000 mg | ORAL_TABLET | Freq: Every day | ORAL | Status: DC
  Administered 2013-01-18: 08:00:00 0.1 mg via ORAL
  Filled 2013-01-13 (×2): qty 1

## 2013-01-13 MED ORDER — NAPROXEN 500 MG PO TABS
500.0000 mg | ORAL_TABLET | Freq: Two times a day (BID) | ORAL | Status: DC | PRN
  Administered 2013-01-14: 500 mg via ORAL
  Filled 2013-01-13 (×2): qty 1

## 2013-01-13 NOTE — Consult Note (Signed)
Reason for Consult: heroin dependence Referring Physician: Dr. Jeanice Lim Omar Wagner is an 57 y.o. male.  HPI: Omar Wagner is a 57 y.o. male admitted to [facility] floor with heroin dependence and intoxication and now withdrawal symptoms. He stated that he is feeling sick, cold and unable to sleep or relax and tired. atient stated that he got high, lost himself and unable to find his home and went to somebody door. Patient apparently had a large amount of cash on person. Patient is guarded and stated hard to say how much he used. He has previous treatment but years ago. 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 He stated that he lives with a friend of him and has no contact with family.   MSE: he is lying in his bed, covering with bed sheaths. He has depressed and tired mood and affect is constricted. He has normal speech but low volume. He has denied SI/HI and no evidence of psychosis.    Past Medical History  Diagnosis Date  . COPD (chronic obstructive pulmonary disease)   . Basal cell carcinoma   . Depression     Past Surgical History  Procedure Laterality Date  . Hernia repair      Family History  Problem Relation Age of Onset  . Cancer Other     Social History:  reports that he has been smoking Cigarettes.  He has been smoking about 0.50 packs per day. He has never used smokeless tobacco. He reports that  drinks alcohol. He reports that he uses illicit drugs (Heroin and IV).  Allergies: No Known Allergies  Medications: I have reviewed the patient's current medications.  Results for orders placed during the hospital encounter of 01/11/13 (from the past 48 hour(s))  COMPREHENSIVE METABOLIC PANEL     Status: Abnormal   Collection Time    01/12/13  3:45 AM      Result Value Range   Sodium 138  135 - 145 mEq/L   Potassium 4.0  3.5 - 5.1 mEq/L   Comment: SLIGHT HEMOLYSIS     HEMOLYSIS AT THIS LEVEL MAY AFFECT RESULT   Chloride 104  96 - 112 mEq/L   CO2  23  19 - 32 mEq/L   Glucose, Bld 102 (*) 70 - 99 mg/dL   BUN 8  6 - 23 mg/dL   Creatinine, Ser 1.61  0.50 - 1.35 mg/dL   Calcium 8.9  8.4 - 09.6 mg/dL   Total Protein 6.7  6.0 - 8.3 g/dL   Albumin 2.6 (*) 3.5 - 5.2 g/dL   AST 31  0 - 37 U/L   ALT 11  0 - 53 U/L   Alkaline Phosphatase 94  39 - 117 U/L   Total Bilirubin 0.7  0.3 - 1.2 mg/dL   GFR calc non Af Amer >90  >90 mL/min   GFR calc Af Amer >90  >90 mL/min   Comment: (NOTE)     The eGFR has been calculated using the CKD EPI equation.     This calculation has not been validated in all clinical situations.     eGFR's persistently <90 mL/min signify possible Chronic Kidney     Disease.  CBC     Status: Abnormal   Collection Time    01/12/13  3:45 AM      Result Value Range   WBC 11.8 (*) 4.0 - 10.5 K/uL   RBC 4.23  4.22 - 5.81 MIL/uL  Hemoglobin 13.6  13.0 - 17.0 g/dL   HCT 16.1  09.6 - 04.5 %   MCV 94.8  78.0 - 100.0 fL   MCH 32.2  26.0 - 34.0 pg   MCHC 33.9  30.0 - 36.0 g/dL   RDW 40.9  81.1 - 91.4 %   Platelets 183  150 - 400 K/uL  CLOSTRIDIUM DIFFICILE BY PCR     Status: None   Collection Time    01/12/13  1:00 PM      Result Value Range   C difficile by pcr NEGATIVE  NEGATIVE   Comment: Performed at [facility]  URINALYSIS, ROUTINE W REFLEX MICROSCOPIC     Status: Abnormal   Collection Time    01/12/13  4:13 PM      Result Value Range   Color, Urine YELLOW  YELLOW   APPearance CLEAR  CLEAR   Specific Gravity, Urine 1.025  1.005 - 1.030   pH 6.0  5.0 - 8.0   Glucose, UA NEGATIVE  NEGATIVE mg/dL   Hgb urine dipstick NEGATIVE  NEGATIVE   Bilirubin Urine NEGATIVE  NEGATIVE   Ketones, ur 40 (*) NEGATIVE mg/dL   Protein, ur NEGATIVE  NEGATIVE mg/dL   Urobilinogen, UA 0.2  0.0 - 1.0 mg/dL   Nitrite NEGATIVE  NEGATIVE   Leukocytes, UA NEGATIVE  NEGATIVE   Comment: MICROSCOPIC NOT DONE ON URINES WITH NEGATIVE PROTEIN, BLOOD, LEUKOCYTES, NITRITE, OR GLUCOSE <1000 mg/dL.  VANCOMYCIN, TROUGH     Status:  Abnormal   Collection Time    01/12/13  5:34 PM      Result Value Range   Vancomycin Tr 9.0 (*) 10.0 - 20.0 ug/mL  CBC     Status: Abnormal   Collection Time    01/13/13  5:42 AM      Result Value Range   WBC 13.8 (*) 4.0 - 10.5 K/uL   RBC 4.46  4.22 - 5.81 MIL/uL   Hemoglobin 14.3  13.0 - 17.0 g/dL   HCT 78.2  95.6 - 21.3 %   MCV 95.3  78.0 - 100.0 fL   MCH 32.1  26.0 - 34.0 pg   MCHC 33.6  30.0 - 36.0 g/dL   RDW 08.6  57.8 - 46.9 %   Platelets 204  150 - 400 K/uL  BASIC METABOLIC PANEL     Status: Abnormal   Collection Time    01/13/13  5:42 AM      Result Value Range   Sodium 135  135 - 145 mEq/L   Potassium 2.6 (*) 3.5 - 5.1 mEq/L   Comment: REPEATED TO VERIFY     DELTA CHECK NOTED     CRITICAL RESULT CALLED TO, READ BACK BY AND VERIFIED WITH:     SCOTTON,J AT 6295 ON [id number] BY POTEAT,S   Chloride 97  96 - 112 mEq/L   CO2 29  19 - 32 mEq/L   Glucose, Bld 103 (*) 70 - 99 mg/dL   BUN 6  6 - 23 mg/dL   Creatinine, Ser 4.40  0.50 - 1.35 mg/dL   Calcium 8.4  8.4 - 10.2 mg/dL   GFR calc non Af Amer >90  >90 mL/min   GFR calc Af Amer >90  >90 mL/min   Comment: (NOTE)     The eGFR has been calculated using the CKD EPI equation.     This calculation has not been validated in all clinical situations.     eGFR's persistently <90 mL/min signify  possible Chronic Kidney     Disease.  MAGNESIUM     Status: None   Collection Time    01/13/13  5:42 AM      Result Value Range   Magnesium 1.7  1.5 - 2.5 mg/dL  BASIC METABOLIC PANEL     Status: Abnormal   Collection Time    01/13/13  2:09 PM      Result Value Range   Sodium 133 (*) 135 - 145 mEq/L   Potassium 3.1 (*) 3.5 - 5.1 mEq/L   Chloride 97  96 - 112 mEq/L   CO2 25  19 - 32 mEq/L   Glucose, Bld 101 (*) 70 - 99 mg/dL   BUN 6  6 - 23 mg/dL   Creatinine, Ser 1.61  0.50 - 1.35 mg/dL   Calcium 8.5  8.4 - 09.6 mg/dL   GFR calc non Af Amer >90  >90 mL/min   GFR calc Af Amer >90  >90 mL/min   Comment: (NOTE)     The eGFR  has been calculated using the CKD EPI equation.     This calculation has not been validated in all clinical situations.     eGFR's persistently <90 mL/min signify possible Chronic Kidney     Disease.    No results found.  Positive for anorexia, anxiety, bad mood, depression, illegal drug usage and sleep disturbance Blood pressure 141/80, pulse 60, temperature 98.3 F (36.8 C), temperature source Oral, resp. rate 16, height 6' (1.829 m), weight 72.7 kg (160 lb 4.4 oz), SpO2 95.00%.   Assessment/Plan: Opioid dependence vs withdrawal  Start Clonidine protocol Refer to psych social service for possible residential rehab treatment Appreciate psych consult and follow up as clinically required  Nehemiah Settle., MD 01/13/2013, 3:14 PM

## 2013-01-13 NOTE — Progress Notes (Addendum)
[facility] PROGRESS NOTE  Omar Wagner ZOX:[id number] DOB: 1955-10-23 DOA: 01/11/2013  PCP: Per Patient No Pcp  Brief HPI: Omar Wagner is a 57 y.o. male with a past medical history of depression and cellulitis of lower extremities, who was brought in by EMS after [employer] was called as the patient was trying to get into someone else's house. Patient apparently had a large amount of cash on person. There are reports that the patient mentioned that he had alcohol to drink last night. Patient was evaluated in the emergency department. He was given Narcan and he was noted to have bradycardia. He was also noted to be slightly tachypneic. Patient was very confused. He was unable to provide any history. He didn't answer any questions, however, was arousable. He was resisting examination.   Past medical history:  Past Medical History  Diagnosis Date  . COPD (chronic obstructive pulmonary disease)   . Basal cell carcinoma   . 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 He was getting Suboxone off the streets. Complains of diarrhea and some abdominal discomfort. Flat affect. Agrees to be seen by Psychiatry.  Objective: Vital Signs  Filed Vitals:   01/12/13 2022 01/12/13 2123 01/13/13 0637 01/13/13 0852  BP:  122/60 141/80   Pulse: 58 66 60   Temp:  98.2 F (36.8 C) 98.3 F (36.8 C)   TempSrc:  Oral Oral   Resp: 18 18 16    Height:      Weight:      SpO2: 98% 100% 98% 95%    Intake/Output Summary (Last 24 hours) at 01/13/13 1029 Last data filed at 01/12/13 2000  Gross per 24 hour  Intake    900 ml  Output    500 ml  Net    400 ml   Filed Weights   01/11/13 1100 01/12/13 1700  Weight: 72.4 kg (159 lb 9.8 oz) 72.7 kg (160 lb 4.4 oz)    General appearance: alert, cooperative and no distress Resp: coarse breath sounds bilaterally.  Decreased air entry at bases. Few scattered wheezes. Improved aeration noted today. Cardio: regular rate and rhythm, S1, S2 normal, no murmur, click, rub or gallop GI: soft, vague mild tenderness noted diffusely; bowel sounds normal; no masses,  no organomegaly Extremities: Much improved erythema and warmth.  Pulses: 2+ and symmetric Skin: Improving erythema lower extremities Neurologic: Alert. Oriented to Place, date, month, year. No focal deficits.  Lab Results:  Basic Metabolic Panel:  Recent Labs Lab 01/11/13 0520 01/12/13 0345 01/13/13 0542  NA 135 138 135  K 3.7 4.0 2.6*  CL 98 104 97  CO2 29 23 29   GLUCOSE 94 102* 103*  BUN 8 8 6   CREATININE 0.65 0.61 0.56  CALCIUM 9.0 8.9 8.4  MG  --   --  1.7   Liver Function Tests:  Recent Labs Lab 01/11/13 0620 01/12/13 0345  AST 28 31  ALT 12 11  ALKPHOS 105 94  BILITOT 0.8 0.7  PROT 7.4 6.7  ALBUMIN 3.1* 2.6*    Recent Labs Lab 01/11/13 0620  AMMONIA 26   CBC:  Recent Labs Lab 01/11/13 0520 01/12/13 0345 01/13/13 0542  WBC 10.7* 11.8* 13.8*  HGB 13.5 13.6 14.3  HCT 41.0 40.1 42.5  MCV 97.6 94.8 95.3  PLT 164 183 204   CBG:  Recent Labs Lab 01/11/13 0455  GLUCAP  94    Recent Results (from the past 240 hour(s))  CULTURE, BLOOD (ROUTINE X 2)     Status: None   Collection Time    01/11/13  6:20 AM      Result Value Range Status   Specimen Description BLOOD LEFT NECK   Final   Special Requests BOTTLES DRAWN AEROBIC AND ANAEROBIC 3CC   Final   Culture  Setup Time     Final   Value: 01/11/2013 10:00     Performed at [facility]   Culture     Final   Value:        BLOOD CULTURE RECEIVED NO GROWTH TO DATE CULTURE WILL BE HELD FOR 5 DAYS BEFORE ISSUING A FINAL NEGATIVE REPORT     Performed at [facility]   Report Status PENDING   Incomplete  CULTURE, BLOOD (ROUTINE X 2)     Status: None   Collection Time    01/11/13  7:10 AM      Result Value Range Status   Specimen Description  BLOOD LEFT NECK   Final   Special Requests BOTTLES DRAWN AEROBIC AND ANAEROBIC 3CC   Final   Culture  Setup Time     Final   Value: 01/11/2013 13:03     Performed at [facility]   Culture     Final   Value:        BLOOD CULTURE RECEIVED NO GROWTH TO DATE CULTURE WILL BE HELD FOR 5 DAYS BEFORE ISSUING A FINAL NEGATIVE REPORT     Performed at [facility]   Report Status PENDING   Incomplete  MRSA PCR SCREENING     Status: None   Collection Time    01/11/13  9:23 AM      Result Value Range Status   MRSA by PCR NEGATIVE  NEGATIVE Final   Comment:            The GeneXpert MRSA Assay (FDA     approved for NASAL specimens     only), is one component of a     comprehensive MRSA colonization     surveillance program. It is not     intended to diagnose MRSA     infection nor to guide or     monitor treatment for     MRSA infections.  CLOSTRIDIUM DIFFICILE BY PCR     Status: None   Collection Time    01/12/13  1:00 PM      Result Value Range Status   C difficile by pcr NEGATIVE  NEGATIVE Final   Comment: Performed at [facility]      Studies/Results: Dg Chest Port 1 View  01/11/2013   *RADIOLOGY REPORT*  Clinical Data: Chest pain.  Hypoxemia.  Smoker with current history of COPD.  PORTABLE CHEST - 1 VIEW 01/11/2013 1418 hours:  Comparison: Portable chest x-ray earlier same date 0625 hours.  Two- view chest x-ray 03/30/2011, 03/29/2007.  Findings: Cardiac silhouette normal in size for technique and degree of inspiration.  Prominent bronchovascular markings diffusely and central peribronchial thickening, unchanged since earlier in the day but more prominent than on the prior examinations.  No new pulmonary parenchymal abnormalities.  Note again made of multiple calcified loose bodies within the right shoulder joint.  IMPRESSION: Acute superimposed upon chronic bronchitis, stable since earlier in the day.  No evidence of airspace pneumonia or new abnormalities since  earlier in the day.   Original Report Authenticated By:  Hulan Saas, M.D.    Medications:  Scheduled: . albuterol  2.5 mg Nebulization Q6H  . doxycycline  100 mg Oral Q12H  . enoxaparin (LOVENOX) injection  40 mg Subcutaneous Q24H  . guaiFENesin  600 mg Oral BID  . levofloxacin  500 mg Oral Daily  . loperamide  4 mg Oral Once  . magnesium sulfate 1 - 4 g bolus IVPB  2 g Intravenous Once  . potassium chloride  10 mEq Intravenous Q1 Hr x 4  . potassium chloride  40 mEq Oral Once  . sodium chloride  3 mL Intravenous Q12H  . thiamine  100 mg Oral Daily   Continuous: . sodium chloride 75 mL/hr at 01/12/13 1742   ZOX:[id number], acetaminophen, albuterol, loperamide, ondansetron (ZOFRAN) IV, ondansetron  Assessment/Plan:  Principal Problem:   Acute encephalopathy Active Problems:   Cellulitis of multiple sites of lower extremity   Aspiration into airway   Substance abuse   Acute diarrhea   Hypokalemia    Acute encephalopathy Appears to have resolved. Etiology likely multifactorial including infection, drugs. Mobilize. LFT are normal. CT head was reassuringly without acute abnormalities.   Acute Diarrhea Likely due to antibiotics. C diff negative. Will give Imodium. Morphine for abdominal pain.  Hypokalemia Likely due to diarrhea. Replete aggressively.  Possible Aspiration This might have happened when he was unresponsive. No infiltrate on CXR. Continue Nebulizer treatments. Mucinex. Mobilize. Change to oral antibiotics.  Cellulitis of the lower extremities Seems to be improving. Change to oral antibiotics. No DVT on Venous Doppler studies.   Dehydration Improved with IVF. Encourage oral intake.   History of substance abuse He admits to issues with heroin addiction. Has flat affect as well. Will consult Psyche. Patient agrees to be seen by them. Was getting Suboxone off the streets.   DVT Prophylaxis: Lovenox  Code Status: Full code  Family Communication: No  family at bedside. Discussed with patient.  Disposition Plan: Will need to be mobilized. Will go to friends house when discharged.     LOS: 2 days   [facility]  [facility] Pager [phone] 01/13/2013, 10:29 AM  If 8PM-8AM, please contact night-coverage at [email or web address], password [facility]

## 2013-01-14 DIAGNOSIS — F1120 Opioid dependence, uncomplicated: Secondary | ICD-10-CM

## 2013-01-14 DIAGNOSIS — F1193 Opioid use, unspecified with withdrawal: Secondary | ICD-10-CM | POA: Diagnosis present

## 2013-01-14 DIAGNOSIS — F1123 Opioid dependence with withdrawal: Secondary | ICD-10-CM | POA: Diagnosis present

## 2013-01-14 LAB — BASIC METABOLIC PANEL
Calcium: 8.5 mg/dL (ref 8.4–10.5)
GFR calc non Af Amer: >90 mL/min (ref >90)
Glucose, Bld: 149 mg/dL — ABNORMAL HIGH (ref 70–99)
Sodium: 133 mEq/L — ABNORMAL LOW (ref 135–145)

## 2013-01-14 LAB — CBC
MCH: 32.2 pg (ref 26.0–34.0)
MCHC: 34.2 g/dL (ref 30.0–36.0)
Platelets: 228 10*3/uL (ref 150–400)

## 2013-01-14 MED ORDER — POTASSIUM CHLORIDE 10 MEQ/100ML IV SOLN
10.0000 meq | INTRAVENOUS | Status: DC
  Filled 2013-01-14 (×4): qty 100

## 2013-01-14 MED ORDER — POTASSIUM CHLORIDE CRYS ER 20 MEQ PO TBCR
40.0000 meq | EXTENDED_RELEASE_TABLET | Freq: Once | ORAL | Status: AC
  Administered 2013-01-14: 19:00:00 40 meq via ORAL
  Filled 2013-01-14: qty 2

## 2013-01-14 MED ORDER — POTASSIUM CHLORIDE CRYS ER 20 MEQ PO TBCR
40.0000 meq | EXTENDED_RELEASE_TABLET | Freq: Once | ORAL | Status: AC
  Administered 2013-01-14: 14:00:00 40 meq via ORAL
  Filled 2013-01-14: qty 2

## 2013-01-14 NOTE — Progress Notes (Signed)
[facility] PROGRESS NOTE  Omar Wagner OZH:[id number] DOB: July 21, 1955 DOA: 01/11/2013  PCP: Per Patient No Pcp  Brief HPI: Omar Wagner is a 57 y.o. male with a past medical history of depression and cellulitis of lower extremities, who was brought in by EMS after [employer] was called as the patient was trying to get into someone else's house. Patient apparently had a large amount of cash on person. There are reports that the patient mentioned that he had alcohol to drink last night. Patient was evaluated in the emergency department. He was given Narcan and he was noted to have bradycardia. He was also noted to be slightly tachypneic. Patient was very confused. He was unable to provide any history. He didn't answer any questions, however, was arousable. He was resisting examination.   Past medical history:  Past Medical History  Diagnosis Date  . COPD (chronic obstructive pulmonary disease)   . Basal cell carcinoma   . Depression     Consultants: Psychiatry  Procedures: None  Antibiotics: IV Vanc 8/28-->8/30 Zosyn 8/28-->8/30 Doxy 8/30--> Levaquin 8/30-->  Subjective: Patient yawning, still with occasional loose stool but is better. Admits to some abdominal cramps with diarrhea.   Objective: Vital Signs  Filed Vitals:   01/13/13 1944 01/13/13 2342 01/14/13 0700 01/14/13 0828  BP:  135/86 131/89   Pulse:  85 62   Temp:  97.5 F (36.4 C) 97.9 F (36.6 C)   TempSrc:  Oral Oral   Resp:  18 16   Height:      Weight:      SpO2: 93% 93% 95% 95%    Intake/Output Summary (Last 24 hours) at 01/14/13 1242 Last data filed at 01/14/13 1025  Gross per 24 hour  Intake 2369.17 ml  Output   2200 ml  Net 169.17 ml   Filed Weights   01/11/13 1100 01/12/13 1700  Weight: 72.4 kg (159 lb 9.8 oz) 72.7 kg (160 lb 4.4 oz)    General appearance: alert, cooperative and no distress Resp: Improved air entry bilaterally. Few scattered wheezes. Cardio: regular rate and rhythm, S1, S2  normal, no murmur, click, rub or gallop GI: soft, vague mild tenderness noted diffusely; bowel sounds normal; no masses,  no organomegaly Extremities: Much improved erythema and warmth.  Skin: Improving erythema lower extremities Neurologic: Alert. Oriented to Place, date, month, year. No focal deficits.  Lab Results:  Basic Metabolic Panel:  Recent Labs Lab 01/11/13 0520 01/12/13 0345 01/13/13 0542 01/13/13 1409 01/14/13 0429  NA 135 138 135 133* 133*  K 3.7 4.0 2.6* 3.1* 3.1*  CL 98 104 97 97 100  CO2 29 23 29 25 26   GLUCOSE 94 102* 103* 101* 149*  BUN 8 8 6 6 9   CREATININE 0.65 0.61 0.56 0.52 0.60  CALCIUM 9.0 8.9 8.4 8.5 8.5  MG  --   --  1.7  --   --    Liver Function Tests:  Recent Labs Lab 01/11/13 0620 01/12/13 0345  AST 28 31  ALT 12 11  ALKPHOS 105 94  BILITOT 0.8 0.7  PROT 7.4 6.7  ALBUMIN 3.1* 2.6*    Recent Labs Lab 01/11/13 0620  AMMONIA 26   CBC:  Recent Labs Lab 01/11/13 0520 01/12/13 0345 01/13/13 0542 01/14/13 0429  WBC 10.7* 11.8* 13.8* 11.5*  HGB 13.5 13.6 14.3 15.0  HCT 41.0 40.1 42.5 43.9  MCV 97.6 94.8 95.3 94.2  PLT 164 183 204 228   CBG:  Recent Labs Lab  01/11/13 0455  GLUCAP 94    Recent Results (from the past 240 hour(s))  CULTURE, BLOOD (ROUTINE X 2)     Status: None   Collection Time    01/11/13  6:20 AM      Result Value Range Status   Specimen Description BLOOD LEFT NECK   Final   Special Requests BOTTLES DRAWN AEROBIC AND ANAEROBIC 3CC   Final   Culture  Setup Time     Final   Value: 01/11/2013 10:00     Performed at [facility]   Culture     Final   Value:        BLOOD CULTURE RECEIVED NO GROWTH TO DATE CULTURE WILL BE HELD FOR 5 DAYS BEFORE ISSUING A FINAL NEGATIVE REPORT     Performed at [facility]   Report Status PENDING   Incomplete  CULTURE, BLOOD (ROUTINE X 2)     Status: None   Collection Time    01/11/13  7:10 AM      Result Value Range Status   Specimen Description BLOOD  LEFT NECK   Final   Special Requests BOTTLES DRAWN AEROBIC AND ANAEROBIC 3CC   Final   Culture  Setup Time     Final   Value: 01/11/2013 13:03     Performed at [facility]   Culture     Final   Value:        BLOOD CULTURE RECEIVED NO GROWTH TO DATE CULTURE WILL BE HELD FOR 5 DAYS BEFORE ISSUING A FINAL NEGATIVE REPORT     Performed at [facility]   Report Status PENDING   Incomplete  MRSA PCR SCREENING     Status: None   Collection Time    01/11/13  9:23 AM      Result Value Range Status   MRSA by PCR NEGATIVE  NEGATIVE Final   Comment:            The GeneXpert MRSA Assay (FDA     approved for NASAL specimens     only), is one component of a     comprehensive MRSA colonization     surveillance program. It is not     intended to diagnose MRSA     infection nor to guide or     monitor treatment for     MRSA infections.  CLOSTRIDIUM DIFFICILE BY PCR     Status: None   Collection Time    01/12/13  1:00 PM      Result Value Range Status   C difficile by pcr NEGATIVE  NEGATIVE Final   Comment: Performed at [facility]      Studies/Results: No results found.  Medications:  Scheduled: . albuterol  2.5 mg Nebulization Q6H  . cloNIDine  0.1 mg Oral QID   Followed by  . [START ON 01/16/2013] cloNIDine  0.1 mg Oral BH-qamhs   Followed by  . [START ON 01/18/2013] cloNIDine  0.1 mg Oral QAC breakfast  . doxycycline  100 mg Oral Q12H  . enoxaparin (LOVENOX) injection  40 mg Subcutaneous Q24H  . guaiFENesin  600 mg Oral BID  . levofloxacin  500 mg Oral Daily  . sodium chloride  3 mL Intravenous Q12H   Continuous: . sodium chloride 50 mL/hr at 01/14/13 0656   ZHY:[id number], dicyclomine, hydrOXYzine, loperamide, methocarbamol, naproxen, ondansetron  Assessment/Plan:  Principal Problem:   Acute encephalopathy Active Problems:   Cellulitis of multiple sites of lower extremity  Aspiration into airway   Substance abuse   Acute diarrhea   Hypokalemia    Heroin withdrawal    Acute encephalopathy Appears to have resolved. Etiology likely multifactorial including infection, drugs. Mobilize. LFT are normal. CT head was reassuringly without acute abnormalities.   Acute Diarrhea Likely due to heroin withdrawal. C diff negative. Will give Imodium. Bentyl as needed.  Heroin Withdrawal On Clonidine protocol. Stable.   Hypokalemia Likely due to diarrhea. Replete aggressively.  Possible Aspiration This might have happened when he was unresponsive. No infiltrate on CXR. Continue Nebulizer treatments. Mucinex. Mobilize. Changed to oral antibiotics.  Cellulitis of the lower extremities Improving. Changed to oral antibiotics. No DVT on Venous Doppler studies.   Dehydration Improved with IVF. Encourage oral intake.   History of substance abuse Appreciate psyche input.   DVT Prophylaxis: Lovenox  Code Status: Full code  Family Communication: No family at bedside. Discussed with patient.  Disposition Plan: Will need to be mobilized. Await completion of clonidine protocol. CSW to evaluate for residential rehab.     LOS: 3 days   [facility]  [facility] Pager [phone] 01/14/2013, 12:42 PM  If 8PM-8AM, please contact night-coverage at [email or web address], password [facility]

## 2013-01-15 LAB — BASIC METABOLIC PANEL
BUN: 11 mg/dL (ref 6–23)
GFR calc Af Amer: >90 mL/min (ref >90)
GFR calc non Af Amer: >90 mL/min (ref >90)
Potassium: 3.5 mEq/L (ref 3.5–5.1)
Sodium: 136 mEq/L (ref 135–145)

## 2013-01-15 MED ORDER — POTASSIUM CHLORIDE CRYS ER 20 MEQ PO TBCR
40.0000 meq | EXTENDED_RELEASE_TABLET | Freq: Once | ORAL | Status: AC
  Administered 2013-01-15: 13:00:00 40 meq via ORAL
  Filled 2013-01-15: qty 2

## 2013-01-15 NOTE — Progress Notes (Signed)
[facility] PROGRESS NOTE  JOHNOTHAN BASCOMB Wagner:[id number] DOB: 1955-09-04 DOA: 01/11/2013  PCP: Per Patient No Pcp  Brief HPI: Omar Wagner is a 57 y.o. male with a past medical history of depression and cellulitis of lower extremities, who was brought in by EMS after [employer] was called as the patient was trying to get into someone else's house. Patient apparently had a large amount of cash on person. There are reports that the patient mentioned that he had alcohol to drink last night. Patient was evaluated in the emergency department. He was given Narcan and he was noted to have bradycardia. He was also noted to be slightly tachypneic. Patient was very confused. He was unable to provide any history. He didn't answer any questions, however, was arousable. He was resisting examination.   Past medical history:  Past Medical History  Diagnosis Date  . COPD (chronic obstructive pulmonary disease)   . Basal cell carcinoma   . Depression     Consultants: Psychiatry  Procedures: None  Antibiotics: IV Vanc 8/28-->8/30 Zosyn 8/28-->8/30 Doxy 8/30--> Levaquin 8/30-->  Subjective: Patient feels better. Diarrhea is slowing down. Has ambulated today. Was slightly unsteady per RN but did well.  Objective: Vital Signs  Filed Vitals:   01/14/13 1952 01/14/13 2145 01/15/13 0505 01/15/13 0834  BP:  133/81 138/89   Pulse:  58 60   Temp:  98.4 F (36.9 C) 98.2 F (36.8 C)   TempSrc:  Oral Oral   Resp:  18 19   Height:      Weight:      SpO2: 95% 97% 98% 96%    Intake/Output Summary (Last 24 hours) at 01/15/13 1046 Last data filed at 01/15/13 0600  Gross per 24 hour  Intake 1513.33 ml  Output    350 ml  Net 1163.33 ml   Filed Weights   01/11/13 1100 01/12/13 1700  Weight: 72.4 kg (159 lb 9.8 oz) 72.7 kg (160 lb 4.4 oz)    General appearance: alert, cooperative and no distress Resp: Improved air entry bilaterally. No wheezes. Cardio: regular rate and rhythm, S1, S2 normal, no  murmur, click, rub or gallop GI: soft, non tender today; bowel sounds normal; no masses,  no organomegaly Extremities: Much improved erythema and warmth.  Skin: Improving erythema lower extremities Neurologic: Alert. Oriented to Place, date, month, year. No focal deficits.  Lab Results:  Basic Metabolic Panel:  Recent Labs Lab 01/12/13 0345 01/13/13 0542 01/13/13 1409 01/14/13 0429 01/15/13 0403  NA 138 135 133* 133* 136  K 4.0 2.6* 3.1* 3.1* 3.5  CL 104 97 97 100 103  CO2 23 29 25 26 25   GLUCOSE 102* 103* 101* 149* 169*  BUN 8 6 6 9 11   CREATININE 0.61 0.56 0.52 0.60 0.72  CALCIUM 8.9 8.4 8.5 8.5 8.6  MG  --  1.7  --   --   --    Liver Function Tests:  Recent Labs Lab 01/11/13 0620 01/12/13 0345  AST 28 31  ALT 12 11  ALKPHOS 105 94  BILITOT 0.8 0.7  PROT 7.4 6.7  ALBUMIN 3.1* 2.6*    Recent Labs Lab 01/11/13 0620  AMMONIA 26   CBC:  Recent Labs Lab 01/11/13 0520 01/12/13 0345 01/13/13 0542 01/14/13 0429  WBC 10.7* 11.8* 13.8* 11.5*  HGB 13.5 13.6 14.3 15.0  HCT 41.0 40.1 42.5 43.9  MCV 97.6 94.8 95.3 94.2  PLT 164 183 204 228   CBG:  Recent Labs Lab 01/11/13 0455  GLUCAP 94    Recent Results (from the past 240 hour(s))  CULTURE, BLOOD (ROUTINE X 2)     Status: None   Collection Time    01/11/13  6:20 AM      Result Value Range Status   Specimen Description BLOOD LEFT NECK   Final   Special Requests BOTTLES DRAWN AEROBIC AND ANAEROBIC 3CC   Final   Culture  Setup Time     Final   Value: 01/11/2013 10:00     Performed at [facility]   Culture     Final   Value:        BLOOD CULTURE RECEIVED NO GROWTH TO DATE CULTURE WILL BE HELD FOR 5 DAYS BEFORE ISSUING A FINAL NEGATIVE REPORT     Performed at [facility]   Report Status PENDING   Incomplete  CULTURE, BLOOD (ROUTINE X 2)     Status: None   Collection Time    01/11/13  7:10 AM      Result Value Range Status   Specimen Description BLOOD LEFT NECK   Final   Special  Requests BOTTLES DRAWN AEROBIC AND ANAEROBIC 3CC   Final   Culture  Setup Time     Final   Value: 01/11/2013 13:03     Performed at [facility]   Culture     Final   Value:        BLOOD CULTURE RECEIVED NO GROWTH TO DATE CULTURE WILL BE HELD FOR 5 DAYS BEFORE ISSUING A FINAL NEGATIVE REPORT     Performed at [facility]   Report Status PENDING   Incomplete  MRSA PCR SCREENING     Status: None   Collection Time    01/11/13  9:23 AM      Result Value Range Status   MRSA by PCR NEGATIVE  NEGATIVE Final   Comment:            The GeneXpert MRSA Assay (FDA     approved for NASAL specimens     only), is one component of a     comprehensive MRSA colonization     surveillance program. It is not     intended to diagnose MRSA     infection nor to guide or     monitor treatment for     MRSA infections.  CLOSTRIDIUM DIFFICILE BY PCR     Status: None   Collection Time    01/12/13  1:00 PM      Result Value Range Status   C difficile by pcr NEGATIVE  NEGATIVE Final   Comment: Performed at [facility]      Studies/Results: No results found.  Medications:  Scheduled: . albuterol  2.5 mg Nebulization Q6H  . cloNIDine  0.1 mg Oral QID   Followed by  . [START ON 01/16/2013] cloNIDine  0.1 mg Oral BH-qamhs   Followed by  . [START ON 01/18/2013] cloNIDine  0.1 mg Oral QAC breakfast  . doxycycline  100 mg Oral Q12H  . enoxaparin (LOVENOX) injection  40 mg Subcutaneous Q24H  . guaiFENesin  600 mg Oral BID  . levofloxacin  500 mg Oral Daily  . potassium chloride  40 mEq Oral Once  . sodium chloride  3 mL Intravenous Q12H   Continuous: . sodium chloride 50 mL/hr at 01/15/13 0118   [id number], dicyclomine, hydrOXYzine, loperamide, methocarbamol, naproxen, ondansetron  Assessment/Plan:  Principal Problem:   Acute encephalopathy Active Problems:   Cellulitis of  multiple sites of lower extremity   Aspiration into airway   Substance abuse   Acute diarrhea    Hypokalemia   Heroin withdrawal    Acute encephalopathy Appears to have resolved. Etiology likely multifactorial including infection, drugs. Mobilize. LFT are normal. CT head was reassuringly without acute abnormalities.   Heroin Withdrawal On Clonidine protocol. Stable.   Acute Diarrhea Likely due to heroin withdrawal. C diff negative. Will give Imodium. Bentyl as needed.  Hypokalemia Repleted. Likely due to diarrhea. Monitor.  Possible Aspiration This might have happened when he was unresponsive. No infiltrate on CXR. Continue Nebulizer treatments but only PRN. Mucinex. Mobilize. Changed to oral antibiotics.  Cellulitis of the lower extremities Improving. Changed to oral antibiotics. No DVT on Venous Doppler studies.   Dehydration Improved with IVF. Encourage oral intake.   History of substance abuse Appreciate psyche input.   DVT Prophylaxis: Lovenox  Code Status: Full code  Family Communication: No family at bedside. Discussed with patient.  Disposition Plan: Improving daily. Await completion of clonidine protocol. CSW to evaluate for residential rehab.     LOS: 4 days   [facility]  [facility] Pager [phone] 01/15/2013, 10:46 AM  If 8PM-8AM, please contact night-coverage at [email or web address], password [facility]

## 2013-01-15 NOTE — Progress Notes (Signed)
Lab called gram (+) rods in Aerobic Blood Culture bottle. Called to Dr. Izola Price as she is oncall hospitalist this pm.

## 2013-01-15 NOTE — Progress Notes (Signed)
Clinical Social Work Department CLINICAL SOCIAL WORK PSYCHIATRY SERVICE LINE ASSESSMENT 01/15/2013  Patient:  Omar Wagner  Account:  [id number]  Admit Date:  01/11/2013  Clinical Social Worker:  Unk Lightning, LCSW  Date/Time:  01/15/2013 01:45 PM Referred by:  Physician  Date referred:  01/15/2013 Reason for Referral  Substance Abuse   Presenting Symptoms/Problems (In the person's/family's own words):   Psych consulted due to substance use   Abuse/Neglect/Trauma History (check all that apply)  Denies history   Abuse/Neglect/Trauma Comments:   Psychiatric History (check all that apply)  Residential treatment   Psychiatric medications:  None   Current Mental Health Hospitalizations/Previous Mental Health History:   Patient reports that he went to [facility] about 1 year ago for substance abuse treatment. Patient felt it was beneficial. Patient reports a history of depression but will not elaborate on details.   Current provider:   None   Place and Date:   N/A   Current Medications:   albuterol, dicyclomine, hydrOXYzine, loperamide, methocarbamol, naproxen, ondansetron            . cloNIDine  0.1 mg Oral QID   Followed by     . [START ON 01/16/2013] cloNIDine  0.1 mg Oral BH-qamhs   Followed by     . [START ON 01/18/2013] cloNIDine  0.1 mg Oral QAC breakfast  . doxycycline  100 mg Oral Q12H  . enoxaparin (LOVENOX) injection  40 mg Subcutaneous Q24H  . guaiFENesin  600 mg Oral BID  . levofloxacin  500 mg Oral Daily  . sodium chloride  3 mL Intravenous Q12H   Previous Impatient Admission/Date/Reason:   Patient reports previous stay at [facility].   Emotional Health / Current Symptoms    Suicide/Self Harm  None reported   Suicide attempt in the past:   Patient denies any previous attempts and denies any SI or HI.   Other harmful behavior:   Patient is chronic substance user.   Psychotic/Dissociative Symptoms  None reported   Other Psychotic/Dissociative Symptoms:     Attention/Behavioral Symptoms  Withdrawn   Other Attention / Behavioral Symptoms:   Patient guarded throughout assessment.    Cognitive Impairment  Within Normal Limits   Other Cognitive Impairment:    Mood and Adjustment  Guarded    Stress, Anxiety, Trauma, Any Recent Loss/Stressor  None reported   Anxiety (frequency):   N/A   Phobia (specify):   N/A   Compulsive behavior (specify):   N/A   Obsessive behavior (specify):   N/A   Other:   N/A   Substance Abuse/Use  Current substance use   SBIRT completed (please refer for detailed history):  Y  Self-reported substance use:   Patient guarded when discussing substance use. Patient spoke with CSW earlier and only admitted to alcohol use. Per chart review, patient reported to psych MD of heroin use. CSW inquired about substance use and amounts used but patient reported he did not feel he needed to answer specific questions about use.   Urinary Drug Screen Completed:  Y Alcohol level:   <11    Environmental/Housing/Living Arrangement  Stable housing   Who is in the home:   Friend   Emergency contact:  James-friend   Financial  IPRS   [name] Strengths and Goals (patient's own words):   Patient reports he is considering treatment.   Clinical Social Worker's Interpretive Summary:   CSW received referral to assess for substance use. CSW had previously met with patient in order to discuss use.  Per chart review, psych MD recommends residential treatment at [place].    CSW met with patient at bedside. Patient had previously only admitted to alcohol use but informed psych MD of heroin use. CSW questioned patient about consumption but patient refused to answer questions. CSW explained the reason for questions regarding use but agreeable to discuss treatment options available if patient is interested in remaining sober.    CSW spoke with patient about treatment options. Patient is not currently participating in  any treatment. Patient reports about a year ago he went to [facility] and felt it was a good program. Patient is unsure how long he was sober after program ended. CSW provided patient with substance abuse resource list. CSW explained the difference between inpatient vs outpatient. Patient reports he has not decided if he will go to treatment but will review list.    CSW reminded patient about inpatient waiting lists and that CSW could assist with getting patient on waiting list if he was interested. CSW also explained that some facilities have 1-2 week waiting lists but CSW could assist with patient creating a plan to remain sober and attend AA/NA meetings until he was accepted.    Patient is not willing to make any commitments today. CSW agreeable to follow up tomorrow in order to assist as needed.   Disposition:  Recommend Psych CSW continuing to support while in hospital

## 2013-01-16 DIAGNOSIS — R7881 Bacteremia: Secondary | ICD-10-CM

## 2013-01-16 LAB — BASIC METABOLIC PANEL
Chloride: 109 mEq/L (ref 96–112)
Creatinine, Ser: 0.6 mg/dL (ref 0.50–1.35)
GFR calc Af Amer: >90 mL/min (ref >90)

## 2013-01-16 NOTE — Progress Notes (Signed)
[facility] PROGRESS NOTE  ATTILA MCCARTHY ZOX:[id number] DOB: 11-Mar-1956 DOA: 01/11/2013  PCP: Per Patient No Pcp  Brief HPI: Omar Wagner is a 57 y.o. male with a past medical history of depression and cellulitis of lower extremities, who was brought in by EMS after [employer] was called as the patient was trying to get into someone else's house. Patient apparently had a large amount of cash on person. There are reports that the patient mentioned that he had alcohol to drink last night. Patient was evaluated in the emergency department. He was given Narcan and he was noted to have bradycardia. He was also noted to be slightly tachypneic. Patient was very confused. He was unable to provide any history. He didn't answer any questions, however, was arousable. He was resisting examination.   Past medical history:  Past Medical History  Diagnosis Date  . COPD (chronic obstructive pulmonary disease)   . Basal cell carcinoma   . Depression     Consultants: Psychiatry  Procedures: None  Antibiotics: IV Vanc 8/28-->8/30 Zosyn 8/28-->8/30 Doxy 8/30--> Levaquin 8/30-->  Subjective: Patient feels better. Diarrhea is slowing down. Has ambulated.   Objective: Vital Signs  Filed Vitals:   01/15/13 0505 01/15/13 0834 01/15/13 1315 01/15/13 2104  BP: 138/89  143/87 146/87  Pulse: 60  93 56  Temp: 98.2 F (36.8 C)  98.3 F (36.8 C) 98.1 F (36.7 C)  TempSrc: Oral  Oral Oral  Resp: 19  18 18   Height:      Weight:      SpO2: 98% 96% 98% 99%    Intake/Output Summary (Last 24 hours) at 01/16/13 1205 Last data filed at 01/16/13 1100  Gross per 24 hour  Intake   1340 ml  Output    200 ml  Net   1140 ml   Filed Weights   01/11/13 1100 01/12/13 1700  Weight: 72.4 kg (159 lb 9.8 oz) 72.7 kg (160 lb 4.4 oz)    General appearance: alert, cooperative and no distress Resp: Improved air entry bilaterally. No wheezes. Cardio: regular rate and rhythm, S1, S2 normal, no murmur, click, rub or  gallop GI: soft, non tender today; bowel sounds normal; no masses,  no organomegaly Extremities: Much improved erythema and warmth.  Skin: Improving erythema lower extremities Neurologic: Alert. Oriented to Place, date, month, year. No focal deficits.  Lab Results:  Basic Metabolic Panel:  Recent Labs Lab 01/12/13 0345 01/13/13 0542 01/13/13 1409 01/14/13 0429 01/15/13 0403 01/16/13 0409  NA 138 135 133* 133* 136 138  K 4.0 2.6* 3.1* 3.1* 3.5 4.3  CL 104 97 97 100 103 109  CO2 23 29 25 26 25 20   GLUCOSE 102* 103* 101* 149* 169* 102*  BUN 8 6 6 9 11 9   CREATININE 0.61 0.56 0.52 0.60 0.72 0.60  CALCIUM 8.9 8.4 8.5 8.5 8.6 8.4  MG  --  1.7  --   --   --   --    Liver Function Tests:  Recent Labs Lab 01/11/13 0620 01/12/13 0345  AST 28 31  ALT 12 11  ALKPHOS 105 94  BILITOT 0.8 0.7  PROT 7.4 6.7  ALBUMIN 3.1* 2.6*    Recent Labs Lab 01/11/13 0620  AMMONIA 26   CBC:  Recent Labs Lab 01/11/13 0520 01/12/13 0345 01/13/13 0542 01/14/13 0429  WBC 10.7* 11.8* 13.8* 11.5*  HGB 13.5 13.6 14.3 15.0  HCT 41.0 40.1 42.5 43.9  MCV 97.6 94.8 95.3 94.2  PLT 164  183 204 228   CBG:  Recent Labs Lab 01/11/13 0455  GLUCAP 94    Recent Results (from the past 240 hour(s))  CULTURE, BLOOD (ROUTINE X 2)     Status: None   Collection Time    01/11/13  6:20 AM      Result Value Range Status   Specimen Description BLOOD LEFT NECK   Final   Special Requests BOTTLES DRAWN AEROBIC AND ANAEROBIC 3CC   Final   Culture  Setup Time     Final   Value: 01/11/2013 10:00     Performed at [facility]   Culture     Final   Value: GRAM POSITIVE RODS     Note: Gram Stain Report Called to,Read Back By and Verified With: Doristine Section [id number] BRMEL     Performed at [facility]   Report Status PENDING   Incomplete  CULTURE, BLOOD (ROUTINE X 2)     Status: None   Collection Time    01/11/13  7:10 AM      Result Value Range Status   Specimen Description  BLOOD LEFT NECK   Final   Special Requests BOTTLES DRAWN AEROBIC AND ANAEROBIC 3CC   Final   Culture  Setup Time     Final   Value: 01/11/2013 13:03     Performed at [facility]   Culture     Final   Value:        BLOOD CULTURE RECEIVED NO GROWTH TO DATE CULTURE WILL BE HELD FOR 5 DAYS BEFORE ISSUING A FINAL NEGATIVE REPORT     Performed at [facility]   Report Status PENDING   Incomplete  MRSA PCR SCREENING     Status: None   Collection Time    01/11/13  9:23 AM      Result Value Range Status   MRSA by PCR NEGATIVE  NEGATIVE Final   Comment:            The GeneXpert MRSA Assay (FDA     approved for NASAL specimens     only), is one component of a     comprehensive MRSA colonization     surveillance program. It is not     intended to diagnose MRSA     infection nor to guide or     monitor treatment for     MRSA infections.  CLOSTRIDIUM DIFFICILE BY PCR     Status: None   Collection Time    01/12/13  1:00 PM      Result Value Range Status   C difficile by pcr NEGATIVE  NEGATIVE Final   Comment: Performed at [facility]      Studies/Results: No results found.  Medications:  Scheduled: . cloNIDine  0.1 mg Oral BH-qamhs   Followed by  . [START ON 01/18/2013] cloNIDine  0.1 mg Oral QAC breakfast  . doxycycline  100 mg Oral Q12H  . enoxaparin (LOVENOX) injection  40 mg Subcutaneous Q24H  . guaiFENesin  600 mg Oral BID  . levofloxacin  500 mg Oral Daily  . sodium chloride  3 mL Intravenous Q12H   Continuous: . sodium chloride 50 mL/hr at 01/15/13 0118   YNW:GNFAOZHYQ, dicyclomine, hydrOXYzine, loperamide, methocarbamol, naproxen, ondansetron  Assessment/Plan:  Principal Problem:   Acute encephalopathy Active Problems:   Cellulitis of multiple sites of lower extremity   Aspiration into airway   Substance abuse   Acute diarrhea   Hypokalemia  Heroin withdrawal   Bacteremia Gram Positive rods reports in one set yesterday. Await final  ID. Probably related to cellulitis. Continue current antibiotics for now. He is afebrile.  Acute encephalopathy Resolved. Etiology likely multifactorial including infection, drugs. Mobilize. LFT are normal. CT head was reassuringly without acute abnormalities.   Heroin Withdrawal On Clonidine protocol. Stable.   Acute Diarrhea Resolved. Likely due to heroin withdrawal. C diff negative. Will give Imodium. Bentyl as needed.  Hypokalemia Repleted. Likely due to diarrhea. Monitor.  Possible Aspiration This might have happened when he was unresponsive. No infiltrate on CXR. Continue Nebulizer treatments but only PRN. Mucinex. Mobilize. Changed to oral antibiotics.  Cellulitis of the lower extremities Improving. Changed to oral antibiotics. No DVT on Venous Doppler studies.   Dehydration Improved with IVF. Encourage oral intake.   History of substance abuse Appreciate psyche input. Patient may be interested in OP rehab.  DVT Prophylaxis: Lovenox  Code Status: Full code  Family Communication: No family at bedside. Discussed with patient.  Disposition Plan: Improving daily. Await completion of clonidine protocol. Await final ID on bacteria in blood.      LOS: 5 days   [facility]  [facility] Pager [phone] 01/16/2013, 12:05 PM  If 8PM-8AM, please contact night-coverage at [email or web address], password [facility]

## 2013-01-16 NOTE — Progress Notes (Signed)
Clinical Social Work  CSW met with patient at bedside. Patient sitting up in bed eating breakfast and watching TV. Patient reports he did not rest well last night but is hopeful that he can DC home today.   CSW and patient discussed substance abuse treatment options. Patient reports he has reviewed list but feels he cannot participate in inpatient rehab facility at this time. Patient states he needs to return home and work in order to pay his bills. Patient reports it is not an option to go to treatment that would not allow him to work at this time.  CSW and patient discussed other treatment options. CSW reviewed intensive outpatient resources and reminded patient that some agencies offer evening meetings so that patient could continue to work during the day. CSW offered to make referral on patient's behalf but patient declined. Patient states he has not decided on treatment at this time but is agreeable to call agencies and set up assessment if he is interested once he DC from the hospital.  CSW attempted to talk with patient about the benefits of treatment in order to increase his chances of remaining sober. Patient appears to be minimizing use and affects of substance use and reports he will review list later.  Patient has substance abuse resource list and can follow up independently. CSW is signing off but available if patient has further needs arise.  [place], [place] [phone]

## 2013-01-17 DIAGNOSIS — R7881 Bacteremia: Secondary | ICD-10-CM

## 2013-01-17 LAB — BASIC METABOLIC PANEL
BUN: 9 mg/dL (ref 6–23)
Chloride: 102 mEq/L (ref 96–112)
GFR calc Af Amer: >90 mL/min (ref >90)
Potassium: 4 mEq/L (ref 3.5–5.1)
Sodium: 132 mEq/L — ABNORMAL LOW (ref 135–145)

## 2013-01-17 LAB — CBC
HCT: 42.6 % (ref 39.0–52.0)
Platelets: 105 10*3/uL — ABNORMAL LOW (ref 150–400)
RDW: 12.4 % (ref 11.5–15.5)
WBC: 11.6 10*3/uL — ABNORMAL HIGH (ref 4.0–10.5)

## 2013-01-17 LAB — CULTURE, BLOOD (ROUTINE X 2)

## 2013-01-17 NOTE — Progress Notes (Signed)
[facility] PROGRESS NOTE  Omar Wagner UEA:[id number] DOB: [birth year] DOA: 01/11/2013 PCP: Per Patient No Pcp  Assessment/Plan: Bacteremia  Gram Positive rods reports in one set yesterday. Await final ID. Probably related to cellulitis. Continue current antibiotics for now. He is afebrile.  -spoke with microbiology--suspect diphtheroid Acute encephalopathy  Resolved. Etiology likely multifactorial including infection, drugs. Mobilize. LFT are normal. CT head was reassuringly without acute abnormalities.  Heroin Withdrawal  On Clonidine protocol. Stable.  Acute Diarrhea  Resolved. Likely due to heroin withdrawal. C diff negative. Will give Imodium. Bentyl as needed.  Hypokalemia  Repleted. Likely due to diarrhea. Monitor.  Possible Aspiration  This might have happened when he was unresponsive. No infiltrate on CXR. Continue Nebulizer treatments but only PRN. Mucinex. Mobilize. Changed to oral antibiotics.  -finish 7 days of abx on 01/17/13 Cellulitis of the lower extremities  Improving. Changed to oral antibiotics. No DVT on Venous Doppler studies. -continue doxycycline  Dehydration  Improved with IVF. Encourage oral intake.  History of substance abuse  Appreciate psych input. Patient interested in OP rehab.  DVT Prophylaxis: Lovenox  Code Status: Full code  Family Communication: No family at bedside. Discussed with patient.  Disposition Plan: Improving daily. Await completion of clonidine protocol. Await final ID on bacteria in blood.           Procedures/Studies: Ct Head Wo Contrast  01/11/2013   *RADIOLOGY REPORT*  Clinical Data: Altered mental status.  CT HEAD WITHOUT CONTRAST  Technique:  Contiguous axial images were obtained from the base of the skull through the vertex without contrast.  Comparison: Head CT 11/12/2005.  Findings: Patchy areas of decreased attenuation are noted throughout the deep and periventricular white matter of the cerebral hemispheres  bilaterally, compatible with mild chronic microvascular ischemic disease. No acute intracranial abnormalities.  Specifically, no evidence of acute intracranial hemorrhage, no definite findings of acute/subacute cerebral ischemia, no mass, mass effect, hydrocephalus or abnormal intra or extra-axial fluid collections.  Visualized paranasal sinuses and mastoids are well pneumatized.  No acute displaced skull fractures are identified.  IMPRESSION: 1.  No acute intracranial abnormalities. 2.  Mild chronic microvascular ischemic changes in the cerebral white matter bilaterally.   Original Report Authenticated By: Trudie Reed, M.D.   Dg Chest Port 1 View  01/11/2013   *RADIOLOGY REPORT*  Clinical Data: Chest pain.  Hypoxemia.  Smoker with current history of COPD.  PORTABLE CHEST - 1 VIEW 01/11/2013 1418 hours:  Comparison: Portable chest x-ray earlier same date 0625 hours.  Two- view chest x-ray 03/30/2011, 03/29/2007.  Findings: Cardiac silhouette normal in size for technique and degree of inspiration.  Prominent bronchovascular markings diffusely and central peribronchial thickening, unchanged since earlier in the day but more prominent than on the prior examinations.  No new pulmonary parenchymal abnormalities.  Note again made of multiple calcified loose bodies within the right shoulder joint.  IMPRESSION: Acute superimposed upon chronic bronchitis, stable since earlier in the day.  No evidence of airspace pneumonia or new abnormalities since earlier in the day.   Original Report Authenticated By: Hulan Saas, M.D.   Dg Chest Portable 1 View  01/11/2013   *RADIOLOGY REPORT*  Clinical Data: Altered mental status  PORTABLE CHEST - 1 VIEW  Comparison: 03/30/2011  Findings: Reticulonodular interstitial prominence and mild peribronchial thickening.  Normal heart size.  Mild scattered atherosclerotic disease of the aorta.  No confluent airspace opacity, pleural effusion, or pneumothorax.  Advanced shoulder  degenerative changes and rounded calcific densities projecting over the  right scapula, may reflect loose bodies.  IMPRESSION: Interstitial prominence / peribronchial thickening may reflect chronic or acute on chronic bronchitic change.  No focal consolidation.   Original Report Authenticated By: Jearld Lesch, M.D.         Subjective: Patient is doing well. Denies fevers, chills, chest discomfort, shortness of breath, nausea, vomiting, diarrhea, abdominal pain, dysuria, hematuria.  Objective: Filed Vitals:   01/15/13 2104 01/16/13 1420 01/16/13 2056 01/17/13 0533  BP: 146/87 144/86 143/81 149/89  Pulse: 56 58 52 51  Temp: 98.1 F (36.7 C) 98.1 F (36.7 C) 97.9 F (36.6 C) 98.2 F (36.8 C)  TempSrc: Oral Oral Oral Oral  Resp: 18 18 20 20   Height:      Weight:      SpO2: 99% 100% 100% 99%    Intake/Output Summary (Last 24 hours) at 01/17/13 0926 Last data filed at 01/17/13 0659  Gross per 24 hour  Intake    900 ml  Output    825 ml  Net     75 ml   Weight change:  Exam:   General:  Pt is alert, follows commands appropriately, not in acute distress  HEENT: No icterus, No thrush, No neck mass, [place]/AT  Cardiovascular: RRR, S1/S2, no rubs, no gallops  Respiratory: Basilar crackles. No wheezes. Good air movement.  Abdomen: Soft/+BS, non tender, non distended, no guarding  Extremities: No edema, No lymphangitis, No petechiae, No rashes, no synovitis  Data Reviewed: Basic Metabolic Panel:  Recent Labs Lab 01/12/13 0345 01/13/13 0542 01/13/13 1409 01/14/13 0429 01/15/13 0403 01/16/13 0409 01/17/13 0500  NA 138 135 133* 133* 136 138 132*  K 4.0 2.6* 3.1* 3.1* 3.5 4.3 4.0  CL 104 97 97 100 103 109 102  CO2 23 29 25 26 25 20 23   GLUCOSE 102* 103* 101* 149* 169* 102* 111*  BUN 8 6 6 9 11 9 9   CREATININE 0.61 0.56 0.52 0.60 0.72 0.60 0.58  CALCIUM 8.9 8.4 8.5 8.5 8.6 8.4 8.7  MG  --  1.7  --   --   --   --   --    Liver Function Tests:  Recent Labs Lab  01/11/13 0620 01/12/13 0345  AST 28 31  ALT 12 11  ALKPHOS 105 94  BILITOT 0.8 0.7  PROT 7.4 6.7  ALBUMIN 3.1* 2.6*   No results found for this basename: LIPASE, AMYLASE,  in the last 168 hours  Recent Labs Lab 01/11/13 0620  AMMONIA 26   CBC:  Recent Labs Lab 01/11/13 0520 01/12/13 0345 01/13/13 0542 01/14/13 0429 01/17/13 0500  WBC 10.7* 11.8* 13.8* 11.5* 11.6*  HGB 13.5 13.6 14.3 15.0 15.1  HCT 41.0 40.1 42.5 43.9 42.6  MCV 97.6 94.8 95.3 94.2 90.6  PLT 164 183 204 228 105*   Cardiac Enzymes: No results found for this basename: CKTOTAL, CKMB, CKMBINDEX, TROPONINI,  in the last 168 hours BNP: No components found with this basename: POCBNP,  CBG:  Recent Labs Lab 01/11/13 0455  GLUCAP 94    Recent Results (from the past 240 hour(s))  CULTURE, BLOOD (ROUTINE X 2)     Status: None   Collection Time    01/11/13  6:20 AM      Result Value Range Status   Specimen Description BLOOD LEFT NECK   Final   Special Requests BOTTLES DRAWN AEROBIC AND ANAEROBIC 3CC   Final   Culture  Setup Time     Final   Value:  01/11/2013 10:00     Performed at [facility]   Culture     Final   Value: GRAM POSITIVE RODS     Note: Gram Stain Report Called to,Read Back By and Verified With: Doristine Section [id number] BRMEL     Performed at [facility]   Report Status PENDING   Incomplete  CULTURE, BLOOD (ROUTINE X 2)     Status: None   Collection Time    01/11/13  7:10 AM      Result Value Range Status   Specimen Description BLOOD LEFT NECK   Final   Special Requests BOTTLES DRAWN AEROBIC AND ANAEROBIC 3CC   Final   Culture  Setup Time     Final   Value: 01/11/2013 13:03     Performed at [facility]   Culture     Final   Value: NO GROWTH 5 DAYS     Performed at [facility]   Report Status 01/17/2013 FINAL   Final  MRSA PCR SCREENING     Status: None   Collection Time    01/11/13  9:23 AM      Result Value Range Status   MRSA by PCR  NEGATIVE  NEGATIVE Final   Comment:            The GeneXpert MRSA Assay (FDA     approved for NASAL specimens     only), is one component of a     comprehensive MRSA colonization     surveillance program. It is not     intended to diagnose MRSA     infection nor to guide or     monitor treatment for     MRSA infections.  CLOSTRIDIUM DIFFICILE BY PCR     Status: None   Collection Time    01/12/13  1:00 PM      Result Value Range Status   C difficile by pcr NEGATIVE  NEGATIVE Final   Comment: Performed at [facility]     Scheduled Meds: . cloNIDine  0.1 mg Oral BH-qamhs   Followed by  . [START ON 01/18/2013] cloNIDine  0.1 mg Oral QAC breakfast  . doxycycline  100 mg Oral Q12H  . enoxaparin (LOVENOX) injection  40 mg Subcutaneous Q24H  . guaiFENesin  600 mg Oral BID  . levofloxacin  500 mg Oral Daily  . sodium chloride  3 mL Intravenous Q12H   Continuous Infusions: . sodium chloride 50 mL/hr at 01/15/13 0118     Arlind Klingerman, DO  [facility] Pager [phone]  If 7PM-7AM, please contact night-coverage [email or web address] Password [email or web address] 01/17/2013, 9:26 AM   LOS: 6 days

## 2013-01-17 NOTE — Evaluation (Signed)
Occupational Therapy Evaluation Patient Details Name: Omar Wagner MRN: [id number] DOB: Dec 23, 1955 Today's Date: 01/17/2013 Time: 7829-5621 OT Time Calculation (min): 13 min  OT Assessment / Plan / Recommendation History of present illness 57 y.o. male with a past medical history of depression and cellulitis of lower extremities, who was brought in by EMS after [employer] was called as the patient was trying to get into someone else's house. Patient apparently had a large amount of cash on person. There are reports that the patient mentioned that he had alcohol to drink last night. Patient was evaluated in the emergency department. He was given Narcan and he was noted to have bradycardia. He was also noted to be slightly tachypneic. Patient was very confused.   Clinical Impression   Pt seen for evaluation.  Pt feels he is at baseline:  He is slightly unsteady but did not have a LOB.  Pt will not need further OT.    OT Assessment  Patient needs continued OT Services    Follow Up Recommendations  No OT follow up    Barriers to Discharge      Equipment Recommendations  None recommended by OT    Recommendations for Other Services    Frequency  Min 2X/week    Precautions / Restrictions Precautions Precautions: Fall Restrictions Weight Bearing Restrictions: No   Pertinent Vitals/Pain No c/o pain    ADL  Toilet Transfer: Min guard Toilet Transfer Method: Sit to stand Toilet Transfer Equipment: Comfort height toilet Tub/Shower Transfer: Min guard Tub/Shower Transfer Method: Science writer: Walk in shower Transfers/Ambulation Related to ADLs: ambulated to bathroom then in hall.  Pt unsteady at times but no LOB.  He feels like his balance is fine and at baseline.  He has a tendency to move quickly. ADL Comments: Pt is min guard to gather clothes and can perform adl without assistance (set up).  When simulating washing feet in shower, pt bent over and reached feet.   Does not want shower seat.  Educated to take his time with movement for safety, while he gets back to his baseline.      OT Diagnosis: Generalized weakness  OT Problem List: Impaired balance (sitting and/or standing) OT Treatment Interventions: Self-care/ADL training;DME and/or AE instruction;Balance training;Patient/family education   OT Goals(Current goals can be found in the care plan section) Acute Rehab OT Goals Patient Stated Goal: home to friends OT Goal Formulation: With patient Time For Goal Achievement: 01/24/13 Potential to Achieve Goals: Good ADL Goals Pt Will Transfer to Toilet: with modified independence;regular height toilet Pt Will Perform Tub/Shower Transfer: Shower transfer;with supervision Additional ADL Goal #1: pt will gather clothes at mod I level   Visit Information  Last OT Received On: 01/17/13 Assistance Needed: +1 History of Present Illness: 57 y.o. male with a past medical history of depression and cellulitis of lower extremities, who was brought in by EMS after [employer] was called as the patient was trying to get into someone else's house. Patient apparently had a large amount of cash on person. There are reports that the patient mentioned that he had alcohol to drink last night. Patient was evaluated in the emergency department. He was given Narcan and he was noted to have bradycardia. He was also noted to be slightly tachypneic. Patient was very confused.       Prior Functioning     Home Living Family/patient expects to be discharged to:: Other (Comment) (roommates) Additional Comments: pt vague about discharge environment:  stated  friend's then roommates.  standard commode and walk in shower present Prior Function Level of Independence: Independent Communication Communication: No difficulties         Vision/Perception     Cognition  Cognition Arousal/Alertness: Awake/alert Behavior During Therapy: WFL for tasks assessed/performed Overall  Cognitive Status: Within Functional Limits for tasks assessed (pt unsteady but feels his balance is OK/baseline)    Extremity/Trunk Assessment Upper Extremity Assessment Upper Extremity Assessment: Overall WFL for tasks assessed (able to lift bil UEs to about 130)     Mobility Bed Mobility Supine to Sit: 7: Independent Sit to Supine: 7: Independent Transfers Sit to Stand: 5: Supervision;From bed Stand to Sit: 5: Supervision;To bed Details for Transfer Assistance: no LOB but unsteady at times.  Pt moves quickly; cued for safety     Exercise     Balance     End of Session OT - End of Session Activity Tolerance: Patient tolerated treatment well Patient left: in bed;with call bell/phone within reach;with nursing/sitter in room (eob)  GO     Tangi Shroff 01/17/2013, 1:54 PM Marica Otter, OTR/L [phone] 01/17/2013

## 2013-01-17 NOTE — Evaluation (Signed)
Physical Therapy Evaluation Patient Details Name: Omar Wagner MRN: [id number] DOB: 06/05/1955 Today's Date: 01/17/2013 Time: 4098-1191 PT Time Calculation (min): 13 min  PT Assessment / Plan / Recommendation History of Present Illness  57 y.o. male with a past medical history of depression and cellulitis of lower extremities, who was brought in by EMS after [employer] was called as the patient was trying to get into someone else's house. Patient apparently had a large amount of cash on person. There are reports that the patient mentioned that he had alcohol to drink last night. Patient was evaluated in the emergency department. He was given Narcan and he was noted to have bradycardia. He was also noted to be slightly tachypneic. Patient was very confused.  Clinical Impression  On eval, pt required Min guard assist for mobility-able to ambulate ~400 feet without assistive device. Unsteady at times. Possible plan for d/c tomorrow. Encouraged pt to ambulate a few more times today with nursing supervision.     PT Assessment  Patient needs continued PT services    Follow Up Recommendations  No PT follow up    Does the patient have the potential to tolerate intense rehabilitation      Barriers to Discharge        Equipment Recommendations  None recommended by PT    Recommendations for Other Services     Frequency Min 3X/week    Precautions / Restrictions Precautions Precautions: Fall Restrictions Weight Bearing Restrictions: No   Pertinent Vitals/Pain No c/o pain      Mobility  Bed Mobility Bed Mobility: Supine to Sit;Sit to Supine Supine to Sit: 7: Independent Sit to Supine: 7: Independent Transfers Transfers: Sit to Stand;Stand to Sit Sit to Stand: 5: Supervision;From bed Stand to Sit: 5: Supervision;To bed Details for Transfer Assistance: slightly unsteady Ambulation/Gait Ambulation/Gait Assistance: 4: Min guard Ambulation Distance (Feet): 400 Feet Assistive device:  None Ambulation/Gait Assistance Details: good gait speed. unsteady at times.  Gait Pattern: Step-through pattern    Exercises     PT Diagnosis: Difficulty walking  PT Problem List: Decreased mobility;Decreased balance PT Treatment Interventions: Gait training;Functional mobility training;Therapeutic activities;Therapeutic exercise;Patient/family education;Balance training     PT Goals(Current goals can be found in the care plan section) Acute Rehab PT Goals Patient Stated Goal: home tomorrow PT Goal Formulation: With patient Time For Goal Achievement: 01/24/13 Potential to Achieve Goals: Good  Visit Information  Last PT Received On: 01/17/13 Assistance Needed: +1 History of Present Illness: 57 y.o. male with a past medical history of depression and cellulitis of lower extremities, who was brought in by EMS after [employer] was called as the patient was trying to get into someone else's house. Patient apparently had a large amount of cash on person. There are reports that the patient mentioned that he had alcohol to drink last night. Patient was evaluated in the emergency department. He was given Narcan and he was noted to have bradycardia. He was also noted to be slightly tachypneic. Patient was very confused.       Prior Functioning  Home Living Family/patient expects to be discharged to:: Unsure (pt states to a friend's apt) Living Arrangements: Non-relatives/Friends Prior Function Level of Independence: Independent Communication Communication: No difficulties    Cognition  Cognition Arousal/Alertness: Awake/alert Behavior During Therapy: WFL for tasks assessed/performed Overall Cognitive Status: Within Functional Limits for tasks assessed    Extremity/Trunk Assessment Upper Extremity Assessment Upper Extremity Assessment: Overall WFL for tasks assessed Lower Extremity Assessment Lower Extremity Assessment: Generalized  weakness Cervical / Trunk Assessment Cervical / Trunk  Assessment: Normal   Balance Balance Balance Assessed: Yes Static Standing Balance Static Standing - Balance Support: No upper extremity supported Static Standing - Level of Assistance: 5: Stand by assistance Static Standing - Comment/# of Minutes: EO/EC, narrow BOS, external perturbations- all without LOB. SLS-R foot held for ~5 seconds without LOB-pt able to use good step strategy to recover. Dynamic Standing Balance Dynamic Standing - Balance Support: No upper extremity supported Dynamic Standing - Level of Assistance: 5: Stand by assistance Dynamic Standing - Comments: Had pt perform 360 degree turn, p/u object, step-to/alternating toe taps onto trash can x 8-very close guarding required.  High Level Balance High Level Balance Activites: Direction changes;Turns;Sudden stops;Head turns High Level Balance Comments: wavering with head turns-pt unable to keep head turned for longer than a few seconds.   End of Session PT - End of Session Activity Tolerance: Patient tolerated treatment well Patient left: in bed;with call bell/phone within reach;with bed alarm set  GP     Rebeca Alert, MPT Pager: [phone]

## 2013-01-18 LAB — CBC
HCT: 41.6 % (ref 39.0–52.0)
MCV: 90.8 fL (ref 78.0–100.0)
RBC: 4.58 MIL/uL (ref 4.22–5.81)
WBC: 10.8 10*3/uL — ABNORMAL HIGH (ref 4.0–10.5)

## 2013-01-18 LAB — CULTURE, BLOOD (ROUTINE X 2)

## 2013-01-18 LAB — BASIC METABOLIC PANEL
CO2: 23 mEq/L (ref 19–32)
Chloride: 98 mEq/L (ref 96–112)
Potassium: 3.9 mEq/L (ref 3.5–5.1)
Sodium: 130 mEq/L — ABNORMAL LOW (ref 135–145)

## 2013-01-18 MED ORDER — CLONIDINE HCL 0.1 MG PO TABS: 0.1000 mg | ORAL_TABLET | Freq: Every day | ORAL | Status: DC

## 2013-01-18 MED ORDER — DOXYCYCLINE HYCLATE 100 MG PO TABS: 100.0000 mg | ORAL_TABLET | Freq: Two times a day (BID) | ORAL | Status: DC

## 2013-01-18 NOTE — Progress Notes (Signed)
Discharge instructions given and explained to patient and he verbalized understanding; denies any pain/distress. No wound noted. [name] belong stored in security dept and pharmacy given to patient at discharge. Bus pass given to patient by CSW for transportation.

## 2013-01-18 NOTE — Discharge Summary (Signed)
Physician Discharge Summary  Omar Wagner YNW:[id number] DOB: [birth year] DOA: 01/11/2013  PCP: Per Patient No Pcp  Admit date: 01/11/2013 Discharge date: 01/18/2013  Recommendations for Outpatient Follow-up:  1. Pt will need to follow up with PCP in 2 weeks post discharge 2. Please obtain BMP to evaluate electrolytes and kidney function 3. Please also check CBC to evaluate Hg and Hct levels   Discharge Diagnoses:  Principal Problem:   Acute encephalopathy Active Problems:   Cellulitis of multiple sites of lower extremity   Aspiration into airway   Substance abuse   Acute diarrhea   Hypokalemia   Heroin withdrawal   Bacteremia Bacteremia  Gram Positive rods reports in one set yesterday. Await final ID. Probably related to cellulitis. Continue current antibiotics for now. He is afebrile.  -spoke with microbiology--suspect diphtheroid/corynebacterium -This likely represents a contaminant. The patient has many any further antibiotics Acute encephalopathy  Resolved. Etiology likely multifactorial including infection, drugs. LFT are normal. CT head was reassuringly without acute abnormalities.  -On the day of discharge, the patient's mentation was back to baseline for at least 72 hours Heroin Withdrawal  -Patient was placed on clonidine withdrawal protocol -On the day of discharge he received clonidine 0.1 mg in the morning -To finish his clonidine wean, the patient wanted to take one more dose of clonidine 0.1 mg on 01/19/2013 Acute Diarrhea  Resolved. Likely due to heroin withdrawal. C diff negative. Will give Imodium. Bentyl as needed.  -The patient did not have any further diarrhea after initial presentation Hypokalemia  Repleted. Likely due to diarrhea. Monitor.  Possible Aspiration  This might have happened when he was unresponsive. No infiltrate on CXR. Continue Nebulizer treatments but only PRN. Mucinex. Mobilize. Changed to oral antibiotics.  -finished 7 days of abx on 01/17/13   Cellulitis of the lower extremities  Improving. Changed to oral antibiotics. No DVT on Venous Doppler studies.  -continue doxycycline, 100 mg twice a day x 5 more days -The patient was initially started on vancomycin which was transitioned to oral doxycycline Dehydration  Improved with IVF. Encourage oral intake.  History of substance abuse  -Psychiatry was consulted and the psychiatry social worker continued to follow the patient. -Outpatient rehabilitation resources were provided to the patient Appreciate psych input. Patient interested in OP rehab.  Thrombocytopenia -Likely due to the patient's Lovenox which was discontinued -Patient will need followup with his primary care provider for recheck of his CBC -There were no active signs of bleeding -On 01/17/2013, the patient's platelets decreased from 220,000 to 105,000 Discharge Condition: stable  Disposition: home  Diet:regular Wt Readings from Last 3 Encounters:  01/12/13 72.7 kg (160 lb 4.4 oz)  07/26/11 77.111 kg (170 lb)  06/22/11 77.111 kg (170 lb)    History of present illness:  57 y.o. male with a past medical history of for depression and cellulitis of lower extremities, who was brought in by EMS after [employer] was called as the patient was trying to get into someone else's house. Patient apparently had a large amount of cash on person. There are reports that the patient mentioned that he had alcohol to drink last night. Patient was evaluated in the emergency department. He was given Narcan and he was noted to have bradycardia. He was also noted to be slightly tachypneic. Patient is very confused. He is unable to provide any history. He doesn't answer any questions, however, is arousable. He tries to resist examination. On the following day of admission, the patient's encephalopathy resolved.  It was thought that this was multifactorial including drugs and infectious process. His urinalysis did not suggest UTI. A urine drug screen  showed benzodiazepines and opiates. His TSH was 0.269. Free T4 was 0.84. He will need to have his thyroid functions rechecked when he is clinically stable. The patient initially had loose stool. This resolved. His C. difficile PCR was negative. The patient was dehydrated. He was started with IV fluids and improved clinically. The patient was also started on Zosyn and vancomycin empirically for cellulitis and possible aspiration pneumonitis. Transitioned to oral Levaquin and doxycycline. He finished 7 days of antibiotics for his pulmonary process. He will have 5 additional days of doxycycline for his lower extremity cellulitis.     Discharge Exam: Filed Vitals:   01/18/13 0821  BP: 129/80  Pulse: 95  Temp:   Resp:    Filed Vitals:   01/17/13 1355 01/17/13 2101 01/18/13 0409 01/18/13 0821  BP: 147/86 130/84 129/69 129/80  Pulse: 51 69 52 95  Temp: 97.5 F (36.4 C) 98.3 F (36.8 C) 97.7 F (36.5 C)   TempSrc: Oral Oral Oral   Resp: 18 20 16    Height:      Weight:      SpO2: 100% 98% 98%    General: A&O x 3, NAD, pleasant, cooperative Cardiovascular: RRR, no rub, no gallop, no S3 Respiratory: Diminished breath sounds. Basilar crackles. No wheezes. Good air movement. Abdomen:soft, nontender, nondistended, positive bowel sounds Extremities: No edema, No lymphangitis, no petechiae  Discharge Instructions  Discharge Orders   Future Orders Complete By Expires   Diet - low sodium heart healthy  As directed    Increase activity slowly  As directed        Medication List         cloNIDine 0.1 MG tablet  Commonly known as:  CATAPRES  Take 1 tablet (0.1 mg total) by mouth daily before breakfast. Start 01/19/13     doxycycline 100 MG tablet  Commonly known as:  VIBRA-TABS  Take 1 tablet (100 mg total) by mouth every 12 (twelve) hours.     ibuprofen 200 MG tablet  Commonly known as:  ADVIL,MOTRIN  Take 800-1,200 mg by mouth every 8 (eight) hours as needed. For pain      multivitamin with minerals Tabs tablet  Take 1 tablet by mouth daily.     SUBOXONE 8-2 MG Film  Generic drug:  Buprenorphine HCl-Naloxone HCl  Place 1 each under the tongue daily as needed. For pain     vitamin C 1000 MG tablet  Take 3,000 mg by mouth daily.         The results of significant diagnostics from this hospitalization (including imaging, microbiology, ancillary and laboratory) are listed below for reference.    Significant Diagnostic Studies: Ct Head Wo Contrast  01/11/2013   *RADIOLOGY REPORT*  Clinical Data: Altered mental status.  CT HEAD WITHOUT CONTRAST  Technique:  Contiguous axial images were obtained from the base of the skull through the vertex without contrast.  Comparison: Head CT 11/12/2005.  Findings: Patchy areas of decreased attenuation are noted throughout the deep and periventricular white matter of the cerebral hemispheres bilaterally, compatible with mild chronic microvascular ischemic disease. No acute intracranial abnormalities.  Specifically, no evidence of acute intracranial hemorrhage, no definite findings of acute/subacute cerebral ischemia, no mass, mass effect, hydrocephalus or abnormal intra or extra-axial fluid collections.  Visualized paranasal sinuses and mastoids are well pneumatized.  No acute displaced skull fractures are identified.  IMPRESSION: 1.  No acute intracranial abnormalities. 2.  Mild chronic microvascular ischemic changes in the cerebral white matter bilaterally.   Original Report Authenticated By: Trudie Reed, M.D.   Dg Chest Port 1 View  01/11/2013   *RADIOLOGY REPORT*  Clinical Data: Chest pain.  Hypoxemia.  Smoker with current history of COPD.  PORTABLE CHEST - 1 VIEW 01/11/2013 1418 hours:  Comparison: Portable chest x-ray earlier same date 0625 hours.  Two- view chest x-ray 03/30/2011, 03/29/2007.  Findings: Cardiac silhouette normal in size for technique and degree of inspiration.  Prominent bronchovascular markings diffusely  and central peribronchial thickening, unchanged since earlier in the day but more prominent than on the prior examinations.  No new pulmonary parenchymal abnormalities.  Note again made of multiple calcified loose bodies within the right shoulder joint.  IMPRESSION: Acute superimposed upon chronic bronchitis, stable since earlier in the day.  No evidence of airspace pneumonia or new abnormalities since earlier in the day.   Original Report Authenticated By: Hulan Saas, M.D.   Dg Chest Portable 1 View  01/11/2013   *RADIOLOGY REPORT*  Clinical Data: Altered mental status  PORTABLE CHEST - 1 VIEW  Comparison: 03/30/2011  Findings: Reticulonodular interstitial prominence and mild peribronchial thickening.  Normal heart size.  Mild scattered atherosclerotic disease of the aorta.  No confluent airspace opacity, pleural effusion, or pneumothorax.  Advanced shoulder degenerative changes and rounded calcific densities projecting over the right scapula, may reflect loose bodies.  IMPRESSION: Interstitial prominence / peribronchial thickening may reflect chronic or acute on chronic bronchitic change.  No focal consolidation.   Original Report Authenticated By: Jearld Lesch, M.D.     Microbiology: Recent Results (from the past 240 hour(s))  CULTURE, BLOOD (ROUTINE X 2)     Status: None   Collection Time    01/11/13  6:20 AM      Result Value Range Status   Specimen Description BLOOD LEFT NECK   Final   Special Requests BOTTLES DRAWN AEROBIC AND ANAEROBIC 3CC   Final   Culture  Setup Time     Final   Value: 01/11/2013 10:00     Performed at [facility]   Culture     Final   Value: DIPHTHEROIDS(CORYNEBACTERIUM SPECIES)     Note: Standardized susceptibility testing for this organism is not available.     Note: Gram Stain Report Called to,Read Back By and Verified With: Doristine Section [id number]     Performed at [facility]   Report Status 01/18/2013 FINAL   Final  CULTURE,  BLOOD (ROUTINE X 2)     Status: None   Collection Time    01/11/13  7:10 AM      Result Value Range Status   Specimen Description BLOOD LEFT NECK   Final   Special Requests BOTTLES DRAWN AEROBIC AND ANAEROBIC 3CC   Final   Culture  Setup Time     Final   Value: 01/11/2013 13:03     Performed at [facility]   Culture     Final   Value: NO GROWTH 5 DAYS     Performed at [facility]   Report Status 01/17/2013 FINAL   Final  MRSA PCR SCREENING     Status: None   Collection Time    01/11/13  9:23 AM      Result Value Range Status   MRSA by PCR NEGATIVE  NEGATIVE Final   Comment:  The GeneXpert MRSA Assay (FDA     approved for NASAL specimens     only), is one component of a     comprehensive MRSA colonization     surveillance program. It is not     intended to diagnose MRSA     infection nor to guide or     monitor treatment for     MRSA infections.  CLOSTRIDIUM DIFFICILE BY PCR     Status: None   Collection Time    01/12/13  1:00 PM      Result Value Range Status   C difficile by pcr NEGATIVE  NEGATIVE Final   Comment: Performed at [facility]     Labs: Basic Metabolic Panel:  Recent Labs Lab 01/12/13 0345 01/13/13 0542  01/14/13 0429 01/15/13 0403 01/16/13 0409 01/17/13 0500 01/18/13 0400  NA 138 135  < > 133* 136 138 132* 130*  K 4.0 2.6*  < > 3.1* 3.5 4.3 4.0 3.9  CL 104 97  < > 100 103 109 102 98  CO2 23 29  < > 26 25 20 23 23   GLUCOSE 102* 103*  < > 149* 169* 102* 111* 121*  BUN 8 6  < > 9 11 9 9 11   CREATININE 0.61 0.56  < > 0.60 0.72 0.60 0.58 0.64  CALCIUM 8.9 8.4  < > 8.5 8.6 8.4 8.7 8.6  MG  --  1.7  --   --   --   --   --   --   < > = values in this interval not displayed. Liver Function Tests:  Recent Labs Lab 01/12/13 0345  AST 31  ALT 11  ALKPHOS 94  BILITOT 0.7  PROT 6.7  ALBUMIN 2.6*   No results found for this basename: LIPASE, AMYLASE,  in the last 168 hours No results found for this basename:  AMMONIA,  in the last 168 hours CBC:  Recent Labs Lab 01/12/13 0345 01/13/13 0542 01/14/13 0429 01/17/13 0500 01/18/13 0455  WBC 11.8* 13.8* 11.5* 11.6* 10.8*  HGB 13.6 14.3 15.0 15.1 15.0  HCT 40.1 42.5 43.9 42.6 41.6  MCV 94.8 95.3 94.2 90.6 90.8  PLT 183 204 228 105* PLATELET CLUMPS NOTED ON SMEAR, COUNT APPEARS ADEQUATE   Cardiac Enzymes: No results found for this basename: CKTOTAL, CKMB, CKMBINDEX, TROPONINI,  in the last 168 hours BNP: No components found with this basename: POCBNP,  CBG: No results found for this basename: GLUCAP,  in the last 168 hours  Time coordinating discharge:  Greater than 30 minutes  Signed:  Rontavious Albright, DO [facility] Pager: [phone] 01/18/2013, 9:22 AM

## 2014-08-10 ENCOUNTER — Inpatient Hospital Stay (HOSPITAL_COMMUNITY)
Admission: EM | Admit: 2014-08-10 | Discharge: 2014-08-14 | DRG: 603 | Disposition: A | Discharge status: Home / Self Care | Payer: Self-pay | Attending: Internal Medicine | Admitting: Internal Medicine

## 2014-08-10 ENCOUNTER — Encounter (HOSPITAL_COMMUNITY): Payer: Self-pay

## 2014-08-10 ENCOUNTER — Emergency Department (HOSPITAL_COMMUNITY): Payer: Self-pay

## 2014-08-10 DIAGNOSIS — L02419 Cutaneous abscess of limb, unspecified: Secondary | ICD-10-CM | POA: Diagnosis present

## 2014-08-10 DIAGNOSIS — Z8614 Personal history of Methicillin resistant Staphylococcus aureus infection: Secondary | ICD-10-CM

## 2014-08-10 DIAGNOSIS — L03119 Cellulitis of unspecified part of limb: Secondary | ICD-10-CM | POA: Diagnosis present

## 2014-08-10 DIAGNOSIS — W57XXXA Bitten or stung by nonvenomous insect and other nonvenomous arthropods, initial encounter: Secondary | ICD-10-CM | POA: Diagnosis present

## 2014-08-10 DIAGNOSIS — F17210 Nicotine dependence, cigarettes, uncomplicated: Secondary | ICD-10-CM | POA: Diagnosis present

## 2014-08-10 DIAGNOSIS — I471 Supraventricular tachycardia: Secondary | ICD-10-CM | POA: Diagnosis present

## 2014-08-10 DIAGNOSIS — L03115 Cellulitis of right lower limb: Principal | ICD-10-CM

## 2014-08-10 DIAGNOSIS — J449 Chronic obstructive pulmonary disease, unspecified: Secondary | ICD-10-CM | POA: Diagnosis present

## 2014-08-10 DIAGNOSIS — L02415 Cutaneous abscess of right lower limb: Secondary | ICD-10-CM | POA: Diagnosis present

## 2014-08-10 LAB — CBC WITH DIFFERENTIAL/PLATELET
Basophils Absolute: 0 10*3/uL (ref 0.0–0.1)
Basophils Relative: 0 % (ref 0–1)
EOS ABS: 0.1 10*3/uL (ref 0.0–0.7)
EOS PCT: 1 % (ref 0–5)
HCT: 44.6 % (ref 39.0–52.0)
Hemoglobin: 14.9 g/dL (ref 13.0–17.0)
Lymphocytes Relative: 16 % (ref 12–46)
Lymphs Abs: 1.5 10*3/uL (ref 0.7–4.0)
MCH: 33.2 pg (ref 26.0–34.0)
MCHC: 33.4 g/dL (ref 30.0–36.0)
MCV: 99.3 fL (ref 78.0–100.0)
MONO ABS: 0.8 10*3/uL (ref 0.1–1.0)
Monocytes Relative: 9 % (ref 3–12)
NEUTROS PCT: 74 % (ref 43–77)
Neutro Abs: 7.1 10*3/uL (ref 1.7–7.7)
PLATELETS: 223 10*3/uL (ref 150–400)
RBC: 4.49 MIL/uL (ref 4.22–5.81)
RDW: 12.9 % (ref 11.5–15.5)
WBC: 9.5 10*3/uL (ref 4.0–10.5)

## 2014-08-10 LAB — BASIC METABOLIC PANEL
Anion gap: 9 (ref 5–15)
CHLORIDE: 101 mmol/L (ref 96–112)
CO2: 28 mmol/L (ref 19–32)
Calcium: 8.6 mg/dL (ref 8.4–10.5)
Creatinine, Ser: 0.59 mg/dL (ref 0.50–1.35)
GFR calc Af Amer: >90 mL/min (ref >90)
GFR calc non Af Amer: >90 mL/min (ref >90)
Glucose, Bld: 114 mg/dL — ABNORMAL HIGH (ref 70–99)
Potassium: 4 mmol/L (ref 3.5–5.1)
SODIUM: 138 mmol/L (ref 135–145)

## 2014-08-10 LAB — I-STAT CG4 LACTIC ACID, ED: Lactic Acid, Venous: 1.67 mmol/L (ref 0.5–2.0)

## 2014-08-10 MED ORDER — HYDROCODONE-ACETAMINOPHEN 5-325 MG PO TABS
2.0000 | ORAL_TABLET | Freq: Once | ORAL | Status: AC
  Administered 2014-08-10: 2 via ORAL
  Filled 2014-08-10: qty 2

## 2014-08-10 MED ORDER — ALBUTEROL SULFATE HFA 108 (90 BASE) MCG/ACT IN AERS: 2.0000 | INHALATION_SPRAY | RESPIRATORY_TRACT | Status: DC | PRN

## 2014-08-10 MED ORDER — IBUPROFEN 800 MG PO TABS
800.0000 mg | ORAL_TABLET | Freq: Three times a day (TID) | ORAL | Status: DC | PRN
  Administered 2014-08-10: 800 mg via ORAL
  Filled 2014-08-10 (×2): qty 1

## 2014-08-10 MED ORDER — HEPARIN SODIUM (PORCINE) 5000 UNIT/ML IJ SOLN
5000.0000 [IU] | Freq: Three times a day (TID) | INTRAMUSCULAR | Status: DC
  Administered 2014-08-11 – 2014-08-13 (×8): 5000 [IU] via SUBCUTANEOUS
  Filled 2014-08-10 (×13): qty 1

## 2014-08-10 MED ORDER — SODIUM CHLORIDE 0.9 % IV BOLUS (SEPSIS)
1000.0000 mL | Freq: Once | INTRAVENOUS | Status: AC
  Administered 2014-08-10: 1000 mL via INTRAVENOUS

## 2014-08-10 MED ORDER — VANCOMYCIN HCL 10 G IV SOLR
1500.0000 mg | Freq: Two times a day (BID) | INTRAVENOUS | Status: DC
  Administered 2014-08-11 – 2014-08-12 (×3): 1500 mg via INTRAVENOUS
  Filled 2014-08-10 (×4): qty 1500

## 2014-08-10 MED ORDER — ALBUTEROL SULFATE (2.5 MG/3ML) 0.083% IN NEBU
2.5000 mg | INHALATION_SOLUTION | RESPIRATORY_TRACT | Status: DC | PRN
  Administered 2014-08-10: 2.5 mg via RESPIRATORY_TRACT
  Filled 2014-08-10: qty 3

## 2014-08-10 MED ORDER — HYDROCODONE-ACETAMINOPHEN 5-325 MG PO TABS
1.0000 | ORAL_TABLET | ORAL | Status: DC | PRN
  Administered 2014-08-10 – 2014-08-11 (×4): 2 via ORAL
  Filled 2014-08-10 (×4): qty 2

## 2014-08-10 MED ORDER — VANCOMYCIN HCL IN DEXTROSE 1-5 GM/200ML-% IV SOLN
1000.0000 mg | INTRAVENOUS | Status: AC
  Administered 2014-08-10: 1000 mg via INTRAVENOUS
  Filled 2014-08-10: qty 200

## 2014-08-10 NOTE — H&P (Addendum)
[facility] History and Physical  Omar Wagner EVO:[id number] DOB: 07-27-1955 DOA: 08/10/2014  Referring physician: EDP PCP: Per Patient No Pcp   Chief Complaint: Cellulitis   HPI: Omar Wagner is a 59 y.o. male with history of LLE cellulitis and abscess from MRSA in 2013.  Patient presents to ED with RLE abscess that opened up and drained and now has large area of surrounding cellulitis.  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 3 days ago woke up and noticed boil on leg, he popped the boil and expressed purulent drainage.  Since then has developed swelling and erythema of the leg.  Review of Systems: Systems reviewed.  As above, otherwise negative  Past Medical History  Diagnosis Date  . COPD (chronic obstructive pulmonary disease)   . Basal cell carcinoma   . Depression    Past Surgical History  Procedure Laterality Date  . Hernia repair     Social History:  reports that he has been smoking Cigarettes.  He has been smoking about 0.50 packs per day. He has never used smokeless tobacco. He reports that he drinks alcohol. He reports that he uses illicit drugs (Heroin and IV).  No Known Allergies  Family History  Problem Relation Age of Onset  . Cancer Other      Prior to Admission medications   Medication Sig Start Date End Date Taking? Authorizing Provider  albuterol (PROVENTIL HFA;VENTOLIN HFA) 108 (90 BASE) MCG/ACT inhaler Inhale 2 puffs into the lungs every 4 (four) hours as needed for wheezing or shortness of breath.   Yes Historical Provider, MD  Ascorbic Acid (VITAMIN C) 1000 MG tablet Take 1,000 mg by mouth daily.    Yes Historical Provider, MD  ibuprofen (ADVIL,MOTRIN) 200 MG tablet Take 800-1,200 mg by mouth every 8 (eight) hours as needed. For pain   Yes Historical Provider, MD  Multiple Vitamin (MULITIVITAMIN WITH MINERALS) TABS Take 1 tablet by mouth daily.   Yes Historical Provider, MD    Physical Exam: Filed Vitals:   08/10/14 1855  BP: 113/78  Pulse: 102  Temp:   Resp: 14    BP 113/78 mmHg  Pulse 102  Temp(Src) 98.4 F (36.9 C) (Oral)  Resp 14  Ht 6\' 1"  (1.854 m)  Wt 74.844 kg (165 lb)  BMI 21.77 kg/m2  SpO2 98%  General Appearance:    Alert, oriented, no distress, appears stated age  Head:    Normocephalic, atraumatic  Eyes:    PERRL, EOMI, sclera non-icteric        Nose:   Nares without drainage or epistaxis. Mucosa, turbinates normal  Throat:   Moist mucous membranes. Oropharynx without erythema or exudate.  Neck:   Supple. No carotid bruits.  No thyromegaly.  No lymphadenopathy.   Back:     No CVA tenderness, no spinal tenderness  Lungs:     Clear to auscultation bilaterally, without wheezes, rhonchi or rales  Chest wall:    No tenderness to palpitation  Heart:    Regular rate and rhythm without murmurs, gallops, rubs  Abdomen:     Soft, non-tender, nondistended, normal bowel sounds, no organomegaly  Genitalia:    deferred  Rectal:    deferred  Extremities:   No clubbing, cyanosis or edema.  Pulses:   2+ and symmetric all extremities  Skin:   Obvious cellulitis eminating from area of abscess of RLE  Lymph nodes:  Cervical, supraclavicular, and axillary nodes normal  Neurologic:   CNII-XII intact. Normal strength, sensation and reflexes      throughout    Labs on Admission:  Basic Metabolic Panel:  Recent Labs Lab 08/10/14 1724  NA 138  K 4.0  CL 101  CO2 28  GLUCOSE 114*  BUN <5*  CREATININE 0.59  CALCIUM 8.6   Liver Function Tests: No results for input(s): AST, ALT, ALKPHOS, BILITOT, PROT, ALBUMIN in the last 168 hours. No results for input(s): LIPASE, AMYLASE in the last 168 hours. No results for input(s): AMMONIA in the last 168 hours. CBC:  Recent Labs Lab 08/10/14 1724  WBC 9.5  NEUTROABS 7.1  HGB 14.9  HCT 44.6  MCV 99.3  PLT 223   Cardiac Enzymes: No results for input(s): CKTOTAL, CKMB, CKMBINDEX, TROPONINI in  the last 168 hours.  BNP (last 3 results) No results for input(s): PROBNP in the last 8760 hours. CBG: No results for input(s): GLUCAP in the last 168 hours.  Radiological Exams on Admission: Dg Tibia/fibula Right  08/10/2014   CLINICAL DATA:  Leg swelling, redness, open wound mid calf  EXAM: RIGHT TIBIA AND FIBULA - 2 VIEW  COMPARISON:  None.  FINDINGS: Four views of the right tibia-fibula submitted. No acute fracture or subluxation. Mild soft tissue swelling mid calf region. There is soft tissue irregularity probable injury lateral aspect mid calf. No evidence of osteomyelitis.  IMPRESSION: No acute fracture or subluxation. Mild soft tissue swelling mid calf region. There is soft tissue irregularity probable injury lateral aspect mid calf. No evidence of osteomyelitis.   Electronically Signed   By: Lahoma Crocker M.D.   On: 08/10/2014 18:19    EKG: Independently reviewed.  Assessment/Plan Active Problems:   Cellulitis and abscess of leg   1. RLE cellulitis and abscess - 1. vanc per pharm consult, while patient technically only meets criteria for moderate severity purulent cellulitis on our cellulitis protocol, will go ahead and do vanc instead given his known history of MRSA in past (wound culture in Epic 06/10/11) 2. Wound culture 3. Wound care consult, not clear if this will require additional drainage or not at this point.    Code Status: Full Code  Family Communication: No family in room Disposition Plan: Admit to inpatient   Time spent: 70 min  GARDNER, JARED M. [facility] Pager [phone]  If 7AM-7PM, please contact the day team taking care of the patient [email or web address] Password [facility] 08/10/2014, 7:31 PM

## 2014-08-10 NOTE — ED Notes (Signed)
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 Pt reports after that he started to notice swelling and redness to that leg. Pt reports there is a spot on his leg that came to a head that he popped. Pt has a piece of bloodied gauze covering the area.

## 2014-08-10 NOTE — ED Provider Notes (Signed)
CSN: [id number]     Arrival date & time 08/10/14  1637 History   First MD Initiated Contact with Patient 08/10/14 1645     Chief Complaint  Patient presents with  . Leg Swelling     (Consider location/radiation/quality/duration/timing/severity/associated sxs/prior Treatment) HPI Comments: Patient presents today with a chief complaint of pain and erythema of the RLE.  He states that four days ago he was underneath a house for work and felt a tinge.  He is unsure if he was bitten by an insect.  He reports that three days ago he woke up and noticed a boil of his leg.  He then popped the area and squeezed it and purulent drainage was expressed.  He states that two days ago he noticed significant swelling, erythema, and pain of his lower extremity.  He has not taken anything for symptoms prior to arrival.  He denies fever, chills, nausea, vomiting, numbness, or tingling.  He has been ambulatory, but states that the pain of the RLE is worse with ambulation.  He does have a history of Cellulitis and has been diagnosed with MRSA in the past.  Patient reports that he does not have a PCP.  The history is provided by the patient.    Past Medical History  Diagnosis Date  . COPD (chronic obstructive pulmonary disease)   . Basal cell carcinoma   . Depression    Past Surgical History  Procedure Laterality Date  . Hernia repair     Family History  Problem Relation Age of Onset  . Cancer Other    History  Substance Use Topics  . Smoking status: Current Every Day Smoker -- 0.50 packs/day    Types: Cigarettes  . Smokeless tobacco: Never Used  . Alcohol Use: Yes     Comment: social    Review of Systems  All other systems reviewed and are negative.     Allergies  Review of patient's allergies indicates no known allergies.  Home Medications   Prior to Admission medications   Medication Sig Start Date End Date Taking? Authorizing Provider  albuterol (PROVENTIL HFA;VENTOLIN HFA) 108 (90  BASE) MCG/ACT inhaler Inhale 2 puffs into the lungs every 4 (four) hours as needed for wheezing or shortness of breath.   Yes Historical Provider, MD  Ascorbic Acid (VITAMIN C) 1000 MG tablet Take 1,000 mg by mouth daily.    Yes Historical Provider, MD  ibuprofen (ADVIL,MOTRIN) 200 MG tablet Take 800-1,200 mg by mouth every 8 (eight) hours as needed. For pain   Yes Historical Provider, MD  Multiple Vitamin (MULITIVITAMIN WITH MINERALS) TABS Take 1 tablet by mouth daily.   Yes Historical Provider, MD  cloNIDine (CATAPRES) 0.1 MG tablet Take 1 tablet (0.1 mg total) by mouth daily before breakfast. Start 01/19/13 Patient not taking: Reported on 08/10/2014 01/18/13   Orson Eva, MD  doxycycline (VIBRA-TABS) 100 MG tablet Take 1 tablet (100 mg total) by mouth every 12 (twelve) hours. Patient not taking: Reported on 08/10/2014 01/18/13   Orson Eva, MD   BP 169/102 mmHg  Pulse 120  Temp(Src) 98.4 F (36.9 C) (Oral)  Resp 18  Ht 6\' 1"  (1.854 m)  Wt 165 lb (74.844 kg)  BMI 21.77 kg/m2  SpO2 98% Physical Exam  Constitutional: He appears well-developed and well-nourished.  HENT:  Head: Normocephalic and atraumatic.  Mouth/Throat: Oropharynx is clear and moist.  Neck: Normal range of motion. Neck supple.  Cardiovascular: Normal rate, regular rhythm and normal heart sounds.   Pulses:  Dorsalis pedis pulses are 2+ on the right side, and 2+ on the left side.  Pulmonary/Chest: Effort normal and breath sounds normal.  Musculoskeletal: Normal range of motion.       Right knee: He exhibits normal range of motion. No tenderness found.       Right ankle: He exhibits normal range of motion.  Neurological: He is alert.  Distal sensation of the right foot intact  Skin: Skin is warm and dry.     Psychiatric: He has a normal mood and affect.  Nursing note and vitals reviewed.   ED Course  Procedures (including critical care time) Labs Review Labs Reviewed  CBC WITH DIFFERENTIAL/PLATELET  BASIC  METABOLIC PANEL    Imaging Review No results found.   EKG Interpretation None      MDM   Final diagnoses:  None   Patient presents today with an open wound and erythema and edema of the RLE consistent with Cellulitis.  Xray negative for Osteomyelitis.  Labs unremarkable.  However, given the extent of the Cellulitis feel that the patient would benefit from admission and IV antibiotics.  Patient also evaluated by Dr. Mingo Amber.  Patient admitted to [facility] for additional management and treatment.    Hyman Bible, PA-C 08/12/14 2159  Evelina Bucy, MD 08/12/14 [phone]

## 2014-08-10 NOTE — Progress Notes (Signed)
ANTIBIOTIC CONSULT NOTE - INITIAL  Pharmacy Consult for vancomycin Indication: cellulitis  No Known Allergies  Patient Measurements: Height: 6\' 1"  (185.4 cm) Weight: 165 lb (74.844 kg) IBW/kg (Calculated) : 79.9  Vital Signs: Temp: 98.4 F (36.9 C) (03/26 1649) Temp Source: Oral (03/26 1649) BP: 169/102 mmHg (03/26 1649) Pulse Rate: 120 (03/26 1649) Intake/Output from previous day:   Intake/Output from this shift:    Labs:  Recent Labs  08/10/14 1724  WBC 9.5  HGB 14.9  PLT 223  CREATININE 0.59   Estimated Creatinine Clearance: 102.6 mL/min (by C-G formula based on Cr of 0.59). No results for input(s): VANCOTROUGH, VANCOPEAK, VANCORANDOM, GENTTROUGH, GENTPEAK, GENTRANDOM, TOBRATROUGH, TOBRAPEAK, TOBRARND, AMIKACINPEAK, AMIKACINTROU, AMIKACIN in the last 72 hours.   Microbiology: No results found for this or any previous visit (from the past 720 hour(s)).  Medical History: Past Medical History  Diagnosis Date  . COPD (chronic obstructive pulmonary disease)   . Basal cell carcinoma   . Depression     Medications:  Scheduled:   Infusions:  . sodium chloride 1,000 mL (08/10/14 1811)  . vancomycin 1,000 mg (08/10/14 1806)   Assessment: 59 yo presented to ER with leg swelling to treat with vancomycin for possible cellulitis. PMH includes COPD, depression, basal cell carcinoma.  3/26 >> vancomycin >>  Tmax: afebrile WBCs: 9.5 Renal: SCr 0.59, CrCl 103  Goal of Therapy:  Vancomycin trough level 10-15 mcg/ml  Plan:  Vancomycin 1g x1 in ER then start 1500mg  IV q12   Adrian Saran, PharmD, BCPS Pager [phone] 08/10/2014 6:53 PM

## 2014-08-10 NOTE — ED Notes (Signed)
Attempted to call report, RN unavailable.

## 2014-08-11 DIAGNOSIS — J449 Chronic obstructive pulmonary disease, unspecified: Secondary | ICD-10-CM

## 2014-08-11 LAB — MRSA PCR SCREENING: MRSA by PCR: NEGATIVE

## 2014-08-11 MED ORDER — HYDROMORPHONE HCL 1 MG/ML IJ SOLN
INTRAMUSCULAR | Status: AC
  Filled 2014-08-11: qty 1

## 2014-08-11 MED ORDER — HYDROMORPHONE HCL 1 MG/ML IJ SOLN
0.5000 mg | INTRAMUSCULAR | Status: DC | PRN
  Administered 2014-08-11 – 2014-08-14 (×13): 0.5 mg via INTRAVENOUS
  Filled 2014-08-11 (×12): qty 1

## 2014-08-11 MED ORDER — METOPROLOL TARTRATE 25 MG PO TABS
25.0000 mg | ORAL_TABLET | Freq: Every day | ORAL | Status: DC
  Administered 2014-08-11 – 2014-08-12 (×2): 25 mg via ORAL
  Filled 2014-08-11 (×2): qty 1

## 2014-08-11 MED ORDER — HYDROCODONE-ACETAMINOPHEN 5-325 MG PO TABS
1.0000 | ORAL_TABLET | ORAL | Status: DC | PRN
  Administered 2014-08-11 – 2014-08-14 (×13): 2 via ORAL
  Filled 2014-08-11 (×13): qty 2

## 2014-08-11 NOTE — Progress Notes (Signed)
PROGRESS NOTE    VYOM BRASS ZWC:[id number] DOB: 1955-09-13 DOA: 08/10/2014 PCP: Per Patient No Pcp  HPI/Brief narrative 59 y.o. male with history of LLE cellulitis and abscess from MRSA in 2013. Patient presents to ED with RLE abscess that opened up and drained and now has large area of surrounding cellulitis. 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 3 days ago woke up and noticed boil on leg, he popped the boil and expressed purulent drainage. Since then has developed swelling and erythema of the leg.  Assessment/Plan:  RLE wound with cellulitis and possible abscess - Started empirically on IV vancomycin given prior history of MRSA - X-ray without osteomyelitis. - Physical exam is suspicious for underlying abscess which may require I & D - Orthopedics consulted for assistance on 3/27. - Check HIV status.  COPD - Stable   Code Status: Full Family Communication: None at bedside Disposition Plan: Home when stable   Consultants:  Orthopedics  Procedures:  None  Antibiotics:  IV Vancomycin 3/26 >   Subjective: Feels better. Decreased pain and redness at wound site.  Objective: Filed Vitals:   08/10/14 2040 08/10/14 2238 08/11/14 0557 08/11/14 1402  BP: 128/88  126/81 121/80  Pulse: 88  86 85  Temp: 98 F (36.7 C)  97.9 F (36.6 C) 98.1 F (36.7 C)  TempSrc: Oral  Oral Oral  Resp: 18  18 18   Height:      Weight: 74.6 kg (164 lb 7.4 oz)     SpO2: 97% 94% 98% 98%    Intake/Output Summary (Last 24 hours) at 08/11/14 1634 Last data filed at 08/11/14 1325  Gross per 24 hour  Intake    480 ml  Output      0 ml  Net    480 ml   Filed Weights   08/10/14 1739 08/10/14 2040  Weight: 74.844 kg (165 lb) 74.6 kg (164 lb 7.4 oz)     Exam:  General exam: pleasant middle-aged male lying comfortably in bed Respiratory system: Clear. No increased work of breathing. Cardiovascular  system: S1 & S2 heard, RRR. No JVD, murmurs, gallops, clicks or pedal edema. Gastrointestinal system: Abdomen is nondistended, soft and nontender. Normal bowel sounds heard. Central nervous system: Alert and oriented. No focal neurological deficits. Extremities: Symmetric 5 x 5 power. approximately 2 cm diameter wound on the lateral aspect of mid right leg oozing serosanguineous fluid, surrounding/underlying induration or possible abscess and erythema    Data Reviewed: Basic Metabolic Panel:  Recent Labs Lab 08/10/14 1724  NA 138  K 4.0  CL 101  CO2 28  GLUCOSE 114*  BUN <5*  CREATININE 0.59  CALCIUM 8.6   Liver Function Tests: No results for input(s): AST, ALT, ALKPHOS, BILITOT, PROT, ALBUMIN in the last 168 hours. No results for input(s): LIPASE, AMYLASE in the last 168 hours. No results for input(s): AMMONIA in the last 168 hours. CBC:  Recent Labs Lab 08/10/14 1724  WBC 9.5  NEUTROABS 7.1  HGB 14.9  HCT 44.6  MCV 99.3  PLT 223   Cardiac Enzymes: No results for input(s): CKTOTAL, CKMB, CKMBINDEX, TROPONINI in the last 168 hours. BNP (last 3 results) No results for input(s): PROBNP in the last 8760 hours. CBG: No results for input(s): GLUCAP in the last 168 hours.  No results found for this or any previous visit (from the past 240 hour(s)).  Studies: Dg Tibia/fibula Right  08/10/2014   CLINICAL DATA:  Leg swelling, redness, open wound mid calf  EXAM: RIGHT TIBIA AND FIBULA - 2 VIEW  COMPARISON:  None.  FINDINGS: Four views of the right tibia-fibula submitted. No acute fracture or subluxation. Mild soft tissue swelling mid calf region. There is soft tissue irregularity probable injury lateral aspect mid calf. No evidence of osteomyelitis.  IMPRESSION: No acute fracture or subluxation. Mild soft tissue swelling mid calf region. There is soft tissue irregularity probable injury lateral aspect mid calf. No evidence of osteomyelitis.   Electronically Signed    By: Lahoma Crocker M.D.   On: 08/10/2014 18:19        Scheduled Meds: . heparin  5,000 Units Subcutaneous 3 times per day  . vancomycin  1,500 mg Intravenous Q12H   Continuous Infusions:   Active Problems:   Cellulitis and abscess of leg    Time spent: 25 minutes    Erykah Lippert, MD, FACP, FHM. [facility] Pager [phone]  If 7PM-7AM, please contact night-coverage [email or web address] Password [facility] 08/11/2014, 4:34 PM    LOS: 1 day

## 2014-08-11 NOTE — Progress Notes (Signed)
Patient complains of "heart pounding" upon assessment. RN attained vitals and EKG, which showed SVT, HR 164. and notified hospitalist on call, who recommended calling RR.

## 2014-08-11 NOTE — Progress Notes (Signed)
Patient wanted dressing changed to right lower extremity. Cleaned with sterile water and applied 4x4s and Abd pads. Awaiting wound care to come and evaluate area.

## 2014-08-11 NOTE — Consult Note (Signed)
Reason for Consult:right leg draining ulcer Referring Physician: Algis Liming  MD  Omar Wagner is an 59 y.o. male.  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 Admitted yesterday.   Past Medical History  Diagnosis Date  . COPD (chronic obstructive pulmonary disease)   . Basal cell carcinoma   . Depression     Past Surgical History  Procedure Laterality Date  . Hernia repair      Family History  Problem Relation Age of Onset  . Cancer Other     Social History:  reports that he has been smoking Cigarettes.  He has been smoking about 0.50 packs per day. He has never used smokeless tobacco. He reports that he drinks alcohol. He reports that he uses illicit drugs (Heroin and IV).  Allergies: No Known Allergies  Medications: I have reviewed the patient's current medications.  Results for orders placed or performed during the hospital encounter of 08/10/14 (from the past 48 hour(s))  CBC with Differential/Platelet     Status: None   Collection Time: 08/10/14  5:24 PM  Result Value Ref Range   WBC 9.5 4.0 - 10.5 K/uL   RBC 4.49 4.22 - 5.81 MIL/uL   Hemoglobin 14.9 13.0 - 17.0 g/dL   HCT 44.6 39.0 - 52.0 %   MCV 99.3 78.0 - 100.0 fL   MCH 33.2 26.0 - 34.0 pg   MCHC 33.4 30.0 - 36.0 g/dL   RDW 12.9 11.5 - 15.5 %   Platelets 223 150 - 400 K/uL   Neutrophils Relative % 74 43 - 77 %   Neutro Abs 7.1 1.7 - 7.7 K/uL   Lymphocytes Relative 16 12 - 46 %   Lymphs Abs 1.5 0.7 - 4.0 K/uL   Monocytes Relative 9 3 - 12 %   Monocytes Absolute 0.8 0.1 - 1.0 K/uL   Eosinophils Relative 1 0 - 5 %   Eosinophils Absolute 0.1 0.0 - 0.7 K/uL   Basophils Relative 0 0 - 1 %   Basophils Absolute 0.0 0.0 - 0.1 K/uL  Basic metabolic panel     Status: Abnormal   Collection Time: 08/10/14  5:24 PM  Result Value Ref Range   Sodium 138 135 - 145 mmol/L   Potassium 4.0 3.5 - 5.1 mmol/L   Chloride  101 96 - 112 mmol/L   CO2 28 19 - 32 mmol/L   Glucose, Bld 114 (H) 70 - 99 mg/dL   BUN <5 (L) 6 - 23 mg/dL   Creatinine, Ser 0.59 0.50 - 1.35 mg/dL   Calcium 8.6 8.4 - 10.5 mg/dL   GFR calc non Af Amer >90 >90 mL/min   GFR calc Af Amer >90 >90 mL/min    Comment: (NOTE) The eGFR has been calculated using the CKD EPI equation. This calculation has not been validated in all clinical situations. eGFR's persistently <90 mL/min signify possible Chronic Kidney Disease.    Anion gap 9 5 - 15  I-Stat CG4 Lactic Acid, ED     Status: None   Collection Time: 08/10/14  7:53 PM  Result Value Ref Range   Lactic Acid, Venous 1.67 0.5 - 2.0 mmol/L    Dg Tibia/fibula Right  08/10/2014   CLINICAL DATA:  Leg swelling, redness, open wound mid calf  EXAM: RIGHT TIBIA AND FIBULA - 2 VIEW  COMPARISON:  None.  FINDINGS: Four views  of the right tibia-fibula submitted. No acute fracture or subluxation. Mild soft tissue swelling mid calf region. There is soft tissue irregularity probable injury lateral aspect mid calf. No evidence of osteomyelitis.  IMPRESSION: No acute fracture or subluxation. Mild soft tissue swelling mid calf region. There is soft tissue irregularity probable injury lateral aspect mid calf. No evidence of osteomyelitis.   Electronically Signed   By: Omar Wagner M.D.   On: 08/10/2014 18:19    Review of Systems  Constitutional: Positive for fever. Negative for weight loss.  Gastrointestinal: Negative for abdominal pain.  Musculoskeletal: Negative for joint pain and falls.  Skin: Positive for rash.   Blood pressure 121/80, pulse 85, temperature 98.1 F (36.7 Wagner), temperature source Oral, resp. rate 18, height '6\' 1"'  (1.854 m), weight 74.6 kg (164 lb 7.4 oz), SpO2 98 %. Physical Exam  Constitutional: He is oriented to person, place, and time. He appears well-developed and well-nourished.  HENT:  Head: Normocephalic and atraumatic.  Eyes: Pupils are equal, round, and reactive to light.  Neck:  Normal range of motion.  Cardiovascular: Normal rate.   Respiratory: Effort normal.  GI: Soft.  Neurological: He is alert and oriented to person, place, and time.  Skin: There is erythema.  Right midlateral calf 1cm draining wound with cellulitis around it.   No pus expressed.  Distal sensation and pulse normal  Psychiatric: He has a normal mood and affect. His behavior is normal.    Assessment/Plan:  left leg wound, Looks like MRSA in appearance. It is draining and does not need to be opened up. Will get wound culture and also PCR since this looks like a Staph infection grossly.  Thanks  i will be glad to follow him in office after discharge.     Omar Wagner,Omar Wagner 08/11/2014, 5:37 PM

## 2014-08-11 NOTE — Progress Notes (Addendum)
Shift Event: Pt with HR in 160s, EKG with SVT. BP 121/80, other VSS. Pt symptomatic with palpitations and lightheadedness. At bedside, pt converted to SR with HR in 90-100s, without any intervention. Pt states he is on Metoprolol 25mg  PO, which he failed to mention on admission. Of note, pt with hx of heroin abuse, and was suspected of ingesting something during hospitalization. Questioned pt if he has been doing any drugs such as cocaine, and he states he was only using his Albuterol inhaler. He denies any cocaine use. Placed pt on Metoprolol 25 mg PO daily, and tele monitoring.  Care discussed with Dr. Alcario Drought.  [place] [facility] [phone]

## 2014-08-12 DIAGNOSIS — I471 Supraventricular tachycardia: Secondary | ICD-10-CM

## 2014-08-12 DIAGNOSIS — Z720 Tobacco use: Secondary | ICD-10-CM

## 2014-08-12 LAB — RAPID URINE DRUG SCREEN, HOSP PERFORMED
Amphetamines: NOT DETECTED
BENZODIAZEPINES: NOT DETECTED
Barbiturates: NOT DETECTED
Cocaine: NOT DETECTED
OPIATES: POSITIVE — AB
Tetrahydrocannabinol: NOT DETECTED

## 2014-08-12 LAB — BASIC METABOLIC PANEL
Anion gap: 7 (ref 5–15)
BUN: 6 mg/dL (ref 6–23)
CALCIUM: 8.6 mg/dL (ref 8.4–10.5)
CO2: 26 mmol/L (ref 19–32)
CREATININE: 0.75 mg/dL (ref 0.50–1.35)
Chloride: 107 mmol/L (ref 96–112)
GFR calc non Af Amer: >90 mL/min (ref >90)
Glucose, Bld: 122 mg/dL — ABNORMAL HIGH (ref 70–99)
Potassium: 3.5 mmol/L (ref 3.5–5.1)
SODIUM: 140 mmol/L (ref 135–145)

## 2014-08-12 LAB — TSH: TSH: 2.025 u[IU]/mL (ref 0.350–4.500)

## 2014-08-12 LAB — MAGNESIUM: Magnesium: 1.9 mg/dL (ref 1.5–2.5)

## 2014-08-12 MED ORDER — VANCOMYCIN HCL 10 G IV SOLR
1250.0000 mg | Freq: Two times a day (BID) | INTRAVENOUS | Status: DC
  Administered 2014-08-13 – 2014-08-14 (×3): 1250 mg via INTRAVENOUS
  Filled 2014-08-12 (×5): qty 1250

## 2014-08-12 MED ORDER — METOPROLOL TARTRATE 25 MG PO TABS
25.0000 mg | ORAL_TABLET | Freq: Two times a day (BID) | ORAL | Status: DC
  Administered 2014-08-13 – 2014-08-14 (×3): 25 mg via ORAL
  Filled 2014-08-12 (×5): qty 1

## 2014-08-12 MED ORDER — POTASSIUM CHLORIDE CRYS ER 20 MEQ PO TBCR
40.0000 meq | EXTENDED_RELEASE_TABLET | Freq: Once | ORAL | Status: AC
  Administered 2014-08-12: 40 meq via ORAL
  Filled 2014-08-12: qty 2

## 2014-08-12 NOTE — Progress Notes (Addendum)
PROGRESS NOTE    PENG THORSTENSON QTM:[id number] DOB: March 10, 1956 DOA: 08/10/2014 PCP: Per Patient No Pcp  HPI/Brief narrative 59 y.o. male with history of LLE cellulitis and abscess from MRSA in 2013. Patient presents to ED with RLE abscess that opened up and drained and now has large area of surrounding cellulitis. 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 3 days ago woke up and noticed boil on leg, he popped the boil and expressed purulent drainage. Since then has developed swelling and erythema of the leg.  Assessment/Plan:  RLE wound with cellulitis and possible abscess - Started empirically on IV vancomycin given prior history of MRSA - X-ray without osteomyelitis. - Physical exam is suspicious for underlying abscess which may require I & D - Orthopedics consulted for assistance on 3/27: Recommended no surgical intervention. - WOC consulted. - Check HIV status-pending. - Wound culture-negative to date - Could possibly change to oral Bactrim or doxycycline in AM.  COPD - Stable  Paroxysmal supraventricular tachycardia - Patient states that he has history of palpitations and takes metoprolol as needed. Denies any other cardiac history or strokes. - Patient had an episode of symptomatic SVT (palpitations and lightheadedness) in the 160s overnight 3/27 and he spontaneously converted to sinus rhythm without intervention. - ? Substance abuse (suspected ingestion of something in the hospital). Patient denies.  - 2-D echo: Normal EF. TSH normal. - Metoprolol started at 25 daily. Will change to 25 mg twice a day. - Check UDS.  Tobacco abuse - Cessation counseled.   Code Status: Full Family Communication: Patient declined MD's offer to discuss care with family. Disposition Plan: Home possibly 3/29   Consultants:  Orthopedics  Procedures:  2-D echo 08/12/14: Study Conclusions  - Left ventricle: The cavity  size was normal. Wall thickness was normal. Systolic function was normal. The estimated ejection fraction was in the range of 55% to 60%. Wall motion was normal; there were no regional wall motion abnormalities. Left ventricular diastolic function parameters were normal. - Right ventricle: The cavity size was mildly dilated. - Right atrium: The atrium was mildly dilated.  Impressions:  - Normal LV function; mild RAE/RVE; trace MR and TR.  Antibiotics:  IV Vancomycin 3/26 >   Subjective: Overnight 3/27, patient had palpitations and lightheadedness coinciding with SVT in the 160s. Spontaneously reverted to sinus rhythm and became asymptomatic. Denies complaints this morning.   Objective: Filed Vitals:   08/11/14 1935 08/11/14 2117 08/11/14 2136 08/12/14 0519  BP: 118/80  132/83 126/84  Pulse: 165 88 101 77  Temp:   98.1 F (36.7 C) 98.1 F (36.7 C)  TempSrc:   Oral Oral  Resp:   16 16  Height:      Weight:      SpO2:   97% 96%    Intake/Output Summary (Last 24 hours) at 08/12/14 1231 Last data filed at 08/11/14 1823  Gross per 24 hour  Intake    480 ml  Output      2 ml  Net    478 ml   Filed Weights   08/10/14 1739 08/10/14 2040  Weight: 74.844 kg (165 lb) 74.6 kg (164 lb 7.4 oz)     Exam:  General exam: pleasant middle-aged male lying comfortably in bed Respiratory system: Clear. No increased work of breathing. Cardiovascular system: S1 & S2 heard, RRR. No JVD, murmurs, gallops, clicks or pedal edema. telemetry: Sinus bradycardia  in the mid 50s-sinus rhythm.  Gastrointestinal system: Abdomen is nondistended, soft and nontender. Normal bowel sounds heard. Central nervous system: Alert and oriented. No focal neurological deficits. Extremities: Symmetric 5 x 5 power. approximately 2 cm diameter wound on the lateral aspect of mid right leg oozing serosanguineous fluid, surrounding/underlying induration & erythema - improving.   Data Reviewed: Basic  Metabolic Panel:  Recent Labs Lab 08/10/14 1724 08/12/14 1009  NA 138 140  K 4.0 3.5  CL 101 107  CO2 28 26  GLUCOSE 114* 122*  BUN <5* 6  CREATININE 0.59 0.75  CALCIUM 8.6 8.6   Liver Function Tests: No results for input(s): AST, ALT, ALKPHOS, BILITOT, PROT, ALBUMIN in the last 168 hours. No results for input(s): LIPASE, AMYLASE in the last 168 hours. No results for input(s): AMMONIA in the last 168 hours. CBC:  Recent Labs Lab 08/10/14 1724  WBC 9.5  NEUTROABS 7.1  HGB 14.9  HCT 44.6  MCV 99.3  PLT 223   Cardiac Enzymes: No results for input(s): CKTOTAL, CKMB, CKMBINDEX, TROPONINI in the last 168 hours. BNP (last 3 results) No results for input(s): PROBNP in the last 8760 hours. CBG: No results for input(s): GLUCAP in the last 168 hours.  Recent Results (from the past 240 hour(s))  MRSA PCR Screening     Status: None   Collection Time: 08/11/14  5:41 PM  Result Value Ref Range Status   MRSA by PCR NEGATIVE NEGATIVE Final    Comment:        The GeneXpert MRSA Assay (FDA approved for NASAL specimens only), is one component of a comprehensive MRSA colonization surveillance program. It is not intended to diagnose MRSA infection nor to guide or monitor treatment for MRSA infections.   Wound culture     Status: None (Preliminary result)   Collection Time: 08/11/14  5:47 PM  Result Value Ref Range Status   Specimen Description LEG RIGHT CHECK FOR MRSA  Final   Special Requests NONE  Final   Gram Stain   Final    FEW WBC PRESENT, PREDOMINANTLY PMN NO SQUAMOUS EPITHELIAL CELLS SEEN NO ORGANISMS SEEN Performed at [facility]    Culture NO GROWTH Performed at [facility]   Final   Report Status PENDING  Incomplete          Studies: Dg Tibia/fibula Right  08/10/2014   CLINICAL DATA:  Leg swelling, redness, open wound mid calf  EXAM: RIGHT TIBIA AND FIBULA - 2 VIEW  COMPARISON:  None.  FINDINGS: Four views of the right tibia-fibula  submitted. No acute fracture or subluxation. Mild soft tissue swelling mid calf region. There is soft tissue irregularity probable injury lateral aspect mid calf. No evidence of osteomyelitis.  IMPRESSION: No acute fracture or subluxation. Mild soft tissue swelling mid calf region. There is soft tissue irregularity probable injury lateral aspect mid calf. No evidence of osteomyelitis.   Electronically Signed   By: Lahoma Crocker M.D.   On: 08/10/2014 18:19        Scheduled Meds: . heparin  5,000 Units Subcutaneous 3 times per day  . metoprolol tartrate  25 mg Oral Daily  . potassium chloride  40 mEq Oral Once  . vancomycin  1,250 mg Intravenous Q12H   Continuous Infusions:   Active Problems:   Cellulitis and abscess of leg    Time spent: 25 minutes    HONGALGI,ANAND, MD, FACP, FHM. [facility] Pager [phone]  If 7PM-7AM, please contact night-coverage [email or web address]  Password [email or web address] 08/12/2014, 12:31 PM    LOS: 2 days

## 2014-08-12 NOTE — Progress Notes (Addendum)
ANTIBIOTIC CONSULT NOTE  Pharmacy Consult for vancomycin Indication: cellulitis  No Known Allergies  Patient Measurements: Height: 6\' 1"  (185.4 cm) Weight: 164 lb 7.4 oz (74.6 kg) IBW/kg (Calculated) : 79.9  Vital Signs: Temp: 98.1 F (36.7 C) (03/28 0519) Temp Source: Oral (03/28 0519) BP: 126/84 mmHg (03/28 0519) Pulse Rate: 77 (03/28 0519) Intake/Output from previous day: 03/27 0701 - 03/28 0700 In: 720 [P.O.:720] Out: 2 [Urine:2] Intake/Output from this shift:    Labs:  Recent Labs  08/10/14 1724 08/12/14 1009  WBC 9.5  --   HGB 14.9  --   PLT 223  --   CREATININE 0.59 0.75   Estimated Creatinine Clearance: 102.3 mL/min (by C-G formula based on Cr of 0.75). No results for input(s): VANCOTROUGH, VANCOPEAK, VANCORANDOM, GENTTROUGH, GENTPEAK, GENTRANDOM, TOBRATROUGH, TOBRAPEAK, TOBRARND, AMIKACINPEAK, AMIKACINTROU, AMIKACIN in the last 72 hours.   Medical History: Past Medical History  Diagnosis Date  . COPD (chronic obstructive pulmonary disease)   . Basal cell carcinoma   . Depression    Microbiology: 3/27 Wound RLE: ngtd 3/27 MRSA PCR screen: negative   Anti-infectives: 3/26>>vancomycin>>    Assessment: 59 yo presented to ER with leg swelling to treat with vancomycin for possible cellulitis. PMH includes COPD, depression, basal cell carcinoma.  Goal of Therapy:  Vancomycin trough level 10-15 mcg/ml    Today, 08/12/2014: D#3 vancomycin 1500 mg IV q12h for cellulitis with drainage of RLE, suspicious for possible MRSA.   Afebrile since admission WBC WNL (3/26) SCr 0.75, slight bump from yesterday. Est CrCl > 100 mL/min  Plan:  1. Reduce vancomycin to 1250 mg IV q12h 2. SCr in AM. 3. Follow culture results, renal function, clinical course.   Await word on planned duration of IV antibiotic therapy.  Clayburn Pert, PharmD, BCPS Pager: [phone] 08/12/2014  12:14 PM

## 2014-08-12 NOTE — Progress Notes (Signed)
Echocardiogram 2D Echocardiogram has been performed.  Omar Wagner 08/12/2014, 9:30 AM

## 2014-08-12 NOTE — Care Management Note (Addendum)
    Page 1 of 1   08/12/2014     2:46:43 PM CARE MANAGEMENT NOTE 08/12/2014  Patient:  Omar Wagner, Omar Wagner   Account Number:  [id number]  Date Initiated:  08/12/2014  Documentation initiated by:  Sunday Spillers  Subjective/Objective Assessment:   59 yo male admitted with LE cellulitis. PTA lived at home.     Action/Plan:   Home when stable   Anticipated DC Date:  08/14/2014   Anticipated DC Plan:  [place]  CM consult  Outpatient Services - Pt will follow up  PCP issues      Choice offered to / List presented to:             Status of service:  Completed, signed off Medicare Important Message given?   (If response is "NO", the following Medicare IM given date fields will be blank) Date Medicare IM given:   Medicare IM given by:   Date Additional Medicare IM given:   Additional Medicare IM given by:    Discharge Disposition:  HOME/SELF CARE  Per UR Regulation:  Reviewed for med. necessity/level of care/duration of stay  If discussed at [place] of Stay Meetings, dates discussed:    Comments:  08-12-14 Sunday Spillers RN CM Spoke with patient at bedside. Patient states he see NP at the [facility] [place]. They also assist him with medications. He plans to f/u with them at d/c, understands that he needs to see someone there within a week of d/c. He states they just do walk-ins, they do not take appts.

## 2014-08-12 NOTE — Consult Note (Signed)
WOC wound consult note Reason for Consult: Abscess to right lateral calf.   Wound type: Infectious Measurement: 1 cm x 1 cm x 1 cm Wound bed: 100% ruddy red with thick purulent drainage Drainage (amount, consistency, odor) Moderate, purulent, no odor.  Periwound: Erythema and tenderness to touch.  Dressing procedure/placement/frequency:Cleanse wound to right lower calf with NS and pat gently dry.  Apply Iodoform packing strip  Kellie Simmering # [id number])to wound bed.  Cover with 4x4 gauze, wrap with kerlix and secure with tape. Change daily.  Will not follow at this time.  Please re-consult if needed.  Domenic Moras RN BSN [place] Pager [phone]

## 2014-08-13 LAB — CREATININE, SERUM
CREATININE: 0.68 mg/dL (ref 0.50–1.35)
GFR calc Af Amer: >90 mL/min (ref >90)

## 2014-08-13 LAB — HIV ANTIBODY (ROUTINE TESTING W REFLEX): HIV Screen 4th Generation wRfx: NONREACTIVE

## 2014-08-13 NOTE — Progress Notes (Addendum)
RN paged secondary to pt being in SVT in the 160 range. Pt felt heart racing but no chest pain or dizziness. BP high 80 to 90 range, so no IV metoprolol given at this time.  NP instructed RN to have pt cough a few times and give the po metoprolol 25mg  that the RN held last night due to bradycardia. Before EKG could be done, pt spontaneously converted to NSR with HR in the 60-80 range about 10 mins after the po metoprolol. No further action necessary at this time. Will report to attending at [place] may need slightly lower dose of metoprolol ATC as to not cause bradycardia but still help prevent SVT. Or, perhaps, lower the holding parameters.  Clance Boll, NP [facility]

## 2014-08-13 NOTE — Progress Notes (Signed)
PROGRESS NOTE    Omar Wagner DVV:[id number] DOB: 1955/11/02 DOA: 08/10/2014 PCP: Per Patient No Pcp  HPI/Brief narrative 59 y.o. male with history of LLE cellulitis and abscess from MRSA in 2013. Patient presents to ED with RLE abscess that opened up and drained and now has large area of surrounding cellulitis. 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 3 days ago woke up and noticed boil on leg, he popped the boil and expressed purulent drainage. Since then has developed swelling and erythema of the leg.  Assessment/Plan:  RLE wound with cellulitis and possible abscess - Started empirically on IV vancomycin given prior history of MRSA - X-ray without osteomyelitis. - Physical exam is suspicious for underlying abscess which may require I & D - Orthopedics consulted for assistance on 3/27: Recommended no surgical intervention. - WOC consulted. - Check HIV status- Non reactive. - Wound culture-negative to date - Could possibly change to oral Bactrim or doxycycline in AM.  COPD - Stable  Paroxysmal supraventricular tachycardia - Patient states that he has history of palpitations and takes metoprolol as needed. Denies any other cardiac history or strokes. - Patient had an episode of symptomatic SVT (palpitations and lightheadedness) in the 160s overnight 3/27 and he spontaneously converted to sinus rhythm without intervention. Had another episode of prolonged SVT that lasted almost an hour overnight on 3/28. - 3/28 PM dose of BB was held for unclear reason - per discussion with nurse - for DBP 80! - UDS positive for Opiates that he has been receiving in the hospital.  - 2-D echo: Normal EF. TSH normal. - Metoprolol started at 25 daily. Increased to 25 mg twice a day. - Other possibility Aflutter with 2:1 block. No change in Mx. CHA2DS2-VASc score: 0 - Monitor additional 24 hours on telemetry and if no further  episodes, DC home 3/30  Tobacco abuse - Cessation counseled.   Code Status: Full Family Communication: Patient declined MD's offer to discuss care with family. Disposition Plan: Home possibly 3/30   Consultants:  Orthopedics  Procedures:  2-D echo 08/12/14: Study Conclusions  - Left ventricle: The cavity size was normal. Wall thickness was normal. Systolic function was normal. The estimated ejection fraction was in the range of 55% to 60%. Wall motion was normal; there were no regional wall motion abnormalities. Left ventricular diastolic function parameters were normal. - Right ventricle: The cavity size was mildly dilated. - Right atrium: The atrium was mildly dilated.  Impressions:  - Normal LV function; mild RAE/RVE; trace MR and TR.  Antibiotics:  IV Vancomycin 3/26 >   Subjective: Overnight 3/28, patient had symptomatic coinciding with SVT in the 160's from 5:20 AM-6:14 AM on 3/29. Spontaneously reverted to sinus rhythm and became asymptomatic. Denies complaints this morning.   Objective: Filed Vitals:   08/12/14 1233 08/12/14 2116 08/13/14 0500 08/13/14 1246  BP: 127/86 126/80 99/59 116/71  Pulse: 77 58 63 72  Temp: 98.3 F (36.8 C) 98.1 F (36.7 C) 97.9 F (36.6 C) 97.5 F (36.4 C)  TempSrc: Oral Oral Oral Oral  Resp: 18  20 18   Height:      Weight:      SpO2: 96% 97% 93% 97%    Intake/Output Summary (Last 24 hours) at 08/13/14 1657 Last data filed at 08/12/14 1844  Gross per 24 hour  Intake    240 ml  Output      3  ml  Net    237 ml   Filed Weights   08/10/14 1739 08/10/14 2040  Weight: 74.844 kg (165 lb) 74.6 kg (164 lb 7.4 oz)     Exam:  General exam: pleasant middle-aged male lying comfortably in bed Respiratory system: Clear. No increased work of breathing. Cardiovascular system: S1 & S2 heard, RRR. No JVD, murmurs, gallops, clicks or pedal edema. telemetry: Sinus bradycardia in the mid 50s-sinus rhythm. SVT on 3/29 from  5:20am-6:14am, reverted to SR. Gastrointestinal system: Abdomen is nondistended, soft and nontender. Normal bowel sounds heard. Central nervous system: Alert and oriented. No focal neurological deficits. Extremities: Symmetric 5 x 5 power. approximately 2 cm diameter wound on the lateral aspect of mid right leg oozing serosanguineous fluid, surrounding/underlying induration & erythema - improving.   Data Reviewed: Basic Metabolic Panel:  Recent Labs Lab 08/10/14 1724 08/12/14 1009 08/12/14 1230 08/13/14 0615  NA 138 140  --   --   K 4.0 3.5  --   --   CL 101 107  --   --   CO2 28 26  --   --   GLUCOSE 114* 122*  --   --   BUN <5* 6  --   --   CREATININE 0.59 0.75  --  0.68  CALCIUM 8.6 8.6  --   --   MG  --   --  1.9  --    Liver Function Tests: No results for input(s): AST, ALT, ALKPHOS, BILITOT, PROT, ALBUMIN in the last 168 hours. No results for input(s): LIPASE, AMYLASE in the last 168 hours. No results for input(s): AMMONIA in the last 168 hours. CBC:  Recent Labs Lab 08/10/14 1724  WBC 9.5  NEUTROABS 7.1  HGB 14.9  HCT 44.6  MCV 99.3  PLT 223   Cardiac Enzymes: No results for input(s): CKTOTAL, CKMB, CKMBINDEX, TROPONINI in the last 168 hours. BNP (last 3 results) No results for input(s): PROBNP in the last 8760 hours. CBG: No results for input(s): GLUCAP in the last 168 hours.  Recent Results (from the past 240 hour(s))  MRSA PCR Screening     Status: None   Collection Time: 08/11/14  5:41 PM  Result Value Ref Range Status   MRSA by PCR NEGATIVE NEGATIVE Final    Comment:        The GeneXpert MRSA Assay (FDA approved for NASAL specimens only), is one component of a comprehensive MRSA colonization surveillance program. It is not intended to diagnose MRSA infection nor to guide or monitor treatment for MRSA infections.   Wound culture     Status: None (Preliminary result)   Collection Time: 08/11/14  5:47 PM  Result Value Ref Range Status    Specimen Description LEG RIGHT CHECK FOR MRSA  Final   Special Requests NONE  Final   Gram Stain   Final    FEW WBC PRESENT, PREDOMINANTLY PMN NO SQUAMOUS EPITHELIAL CELLS SEEN NO ORGANISMS SEEN Performed at [facility]    Culture   Final    NO GROWTH 1 DAY Performed at [facility]    Report Status PENDING  Incomplete          Studies: No results found.      Scheduled Meds: . heparin  5,000 Units Subcutaneous 3 times per day  . metoprolol tartrate  25 mg Oral BID  . vancomycin  1,250 mg Intravenous Q12H   Continuous Infusions:   Active Problems:   Cellulitis and abscess  of leg    Time spent: 25 minutes    Nayeliz Hipp, MD, FACP, FHM. [facility] Pager [phone]  If 7PM-7AM, please contact night-coverage [email or web address] Password [facility] 08/13/2014, 4:57 PM    LOS: 3 days

## 2014-08-13 NOTE — Progress Notes (Signed)
Patient complained of palpitations this AM when RN enteredroom. Patient states the palpitations start randomly. He requested a metropolol. Hospitalist notified, EKG done, PO metropolol 25 mg administered as instructed by hospitalist, patient was back in sinus rhythm in about 15 minutes, symptoms diminished. Will continue to monitor.

## 2014-08-14 LAB — WOUND CULTURE

## 2014-08-14 MED ORDER — METOPROLOL TARTRATE 25 MG PO TABS: 25.0000 mg | ORAL_TABLET | Freq: Two times a day (BID) | ORAL | Status: DC

## 2014-08-14 MED ORDER — SULFAMETHOXAZOLE-TRIMETHOPRIM 800-160 MG PO TABS: 1.0000 | ORAL_TABLET | Freq: Two times a day (BID) | ORAL | Status: DC

## 2014-08-14 MED ORDER — IBUPROFEN 200 MG PO TABS: 800.0000 mg | ORAL_TABLET | Freq: Three times a day (TID) | ORAL | Status: DC | PRN

## 2014-08-14 MED ORDER — HYDROCODONE-ACETAMINOPHEN 5-325 MG PO TABS: 1.0000 | ORAL_TABLET | ORAL | Status: DC | PRN

## 2014-08-14 NOTE — Discharge Summary (Signed)
Physician Discharge Summary  Omar Wagner SPQ:[id number] DOB: 1956-03-10 DOA: 08/10/2014  PCP: Per Patient No Pcp  Admit date: 08/10/2014 Discharge date: 08/14/2014  Time spent: 35 minutes  Recommendations for Outpatient Follow-up:  Follow up with Rodell Perna for cellulitis abscess of LE.  Follow up with PCP for further evaluation of SVT.   Discharge Diagnoses:    Cellulitis and abscess of leg   Paroxysmal supraventricular tachycardia   COPD  Discharge Condition: stable.   Diet recommendation: heart healthy  Filed Weights   08/10/14 1739 08/10/14 2040  Weight: 74.844 kg (165 lb) 74.6 kg (164 lb 7.4 oz)    History of present illness:  59 y.o. male with history of LLE cellulitis and abscess from MRSA in 2013. Patient presents to ED with RLE abscess that opened up and drained and now has large area of surrounding cellulitis. 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 3 days ago woke up and noticed boil on leg, he popped the boil and expressed purulent drainage. Since then has developed swelling and erythema of the leg.  Hospital Course:  RLE wound with cellulitis and possible abscess - Started empirically on IV vancomycin given prior history of MRSA. Day 5 IV antibiotics.  - X-ray without osteomyelitis. - Orthopedics consulted for assistance on 3/27: Recommended no surgical intervention. - WOC consulted. Daily dressing Changes.  -  HIV  negative.  - Wound culture-negative to date - He will be discharge on Bactrim for 7 more days.   COPD - Stable  Paroxysmal supraventricular tachycardia - Patient states that he has history of palpitations and takes metoprolol as needed. Denies any other cardiac history or strokes. - Patient had an episode of symptomatic SVT (palpitations and lightheadedness) in the 160s overnight 3/27 and he spontaneously converted to sinus rhythm without intervention. - 2-D echo: Normal EF.  TSH normal. - Metoprolol started 25 mg BID. No further episodes.   Tobacco abuse - Cessation counseled.  Procedures:  2-D echo 08/12/14: Study Conclusions  - Left ventricle: The cavity size was normal. Wall thickness was normal. Systolic function was normal. The estimated ejection fraction was in the range of 55% to 60%. Wall motion was normal; there were no regional wall motion abnormalities. Left ventricular diastolic function parameters were normal. - Right ventricle: The cavity size was mildly dilated. - Right atrium: The atrium was mildly dilated.  Impressions:  - Normal LV function; mild RAE/RVE; trace MR and TR.  Consultations:  Orthopedics  Discharge Exam: Filed Vitals:   08/14/14 0535  BP: 120/74  Pulse: 60  Temp: 98.6 F (37 C)  Resp: 18    General: Alert in no distress.  Cardiovascular: S 1, S 2 RRR Respiratory: CTA Extremity: R LE with decreased redness, small ulcer with small amount of drainage.   Discharge Instructions   Discharge Instructions    Diet - low sodium heart healthy    Complete by:  As directed      Increase activity slowly    Complete by:  As directed           Current Discharge Medication List    START taking these medications   Details  HYDROcodone-acetaminophen (NORCO/VICODIN) 5-325 MG per tablet Take 1-2 tablets by mouth every 4 (four) hours as needed for moderate pain. Qty: 30 tablet, Refills: 0    metoprolol tartrate (LOPRESSOR) 25 MG tablet Take 1 tablet (25 mg total) by mouth 2 (  two) times daily. Qty: 60 tablet, Refills: 0    sulfamethoxazole-trimethoprim (BACTRIM DS,SEPTRA DS) 800-160 MG per tablet Take 1 tablet by mouth 2 (two) times daily. Qty: 14 tablet, Refills: 0      CONTINUE these medications which have CHANGED   Details  ibuprofen (ADVIL,MOTRIN) 200 MG tablet Take 4 tablets (800 mg total) by mouth every 8 (eight) hours as needed. For pain Qty: 30 tablet, Refills: 0      CONTINUE these  medications which have NOT CHANGED   Details  albuterol (PROVENTIL HFA;VENTOLIN HFA) 108 (90 BASE) MCG/ACT inhaler Inhale 2 puffs into the lungs every 4 (four) hours as needed for wheezing or shortness of breath.    Ascorbic Acid (VITAMIN C) 1000 MG tablet Take 1,000 mg by mouth daily.     Multiple Vitamin (MULITIVITAMIN WITH MINERALS) TABS Take 1 tablet by mouth daily.       No Known Allergies Follow-up Information    Follow up with YATES,Gerhart C, MD In 1 week.   Specialty:  Orthopedic Surgery   Contact information:   [place] [place] [address] [phone]        The results of significant diagnostics from this hospitalization (including imaging, microbiology, ancillary and laboratory) are listed below for reference.    Significant Diagnostic Studies: Dg Tibia/fibula Right  08/10/2014   CLINICAL DATA:  Leg swelling, redness, open wound mid calf  EXAM: RIGHT TIBIA AND FIBULA - 2 VIEW  COMPARISON:  None.  FINDINGS: Four views of the right tibia-fibula submitted. No acute fracture or subluxation. Mild soft tissue swelling mid calf region. There is soft tissue irregularity probable injury lateral aspect mid calf. No evidence of osteomyelitis.  IMPRESSION: No acute fracture or subluxation. Mild soft tissue swelling mid calf region. There is soft tissue irregularity probable injury lateral aspect mid calf. No evidence of osteomyelitis.   Electronically Signed   By: Lahoma Crocker M.D.   On: 08/10/2014 18:19    Microbiology: Recent Results (from the past 240 hour(s))  MRSA PCR Screening     Status: None   Collection Time: 08/11/14  5:41 PM  Result Value Ref Range Status   MRSA by PCR NEGATIVE NEGATIVE Final    Comment:        The GeneXpert MRSA Assay (FDA approved for NASAL specimens only), is one component of a comprehensive MRSA colonization surveillance program. It is not intended to diagnose MRSA infection nor to guide or monitor treatment for MRSA infections.    Wound culture     Status: None   Collection Time: 08/11/14  5:47 PM  Result Value Ref Range Status   Specimen Description LEG RIGHT CHECK FOR MRSA  Final   Special Requests NONE  Final   Gram Stain   Final    FEW WBC PRESENT, PREDOMINANTLY PMN NO SQUAMOUS EPITHELIAL CELLS SEEN NO ORGANISMS SEEN Performed at [facility]    Culture   Final    MULTIPLE ORGANISMS PRESENT, NONE PREDOMINANT Note: NO STAPHYLOCOCCUS AUREUS ISOLATED NO GROUP A STREP (S.PYOGENES) ISOLATED Performed at [facility]    Report Status 08/14/2014 FINAL  Final     Labs: Basic Metabolic Panel:  Recent Labs Lab 08/10/14 1724 08/12/14 1009 08/12/14 1230 08/13/14 0615  NA 138 140  --   --   K 4.0 3.5  --   --   CL 101 107  --   --   CO2 28 26  --   --   GLUCOSE  114* 122*  --   --   BUN <5* 6  --   --   CREATININE 0.59 0.75  --  0.68  CALCIUM 8.6 8.6  --   --   MG  --   --  1.9  --    Liver Function Tests: No results for input(s): AST, ALT, ALKPHOS, BILITOT, PROT, ALBUMIN in the last 168 hours. No results for input(s): LIPASE, AMYLASE in the last 168 hours. No results for input(s): AMMONIA in the last 168 hours. CBC:  Recent Labs Lab 08/10/14 1724  WBC 9.5  NEUTROABS 7.1  HGB 14.9  HCT 44.6  MCV 99.3  PLT 223   Cardiac Enzymes: No results for input(s): CKTOTAL, CKMB, CKMBINDEX, TROPONINI in the last 168 hours. BNP: BNP (last 3 results) No results for input(s): BNP in the last 8760 hours.  ProBNP (last 3 results) No results for input(s): PROBNP in the last 8760 hours.  CBG: No results for input(s): GLUCAP in the last 168 hours.     Signed:  Niel Hummer A  [facility] 08/14/2014, 8:39 AM

## 2014-08-14 NOTE — Progress Notes (Signed)
Patient discharge home, alert and oriented, discharge instructions given, patient verbalize understanding of discharge instructions given, My Chart access declined at this time, patient in stable condition at this time

## 2014-12-16 ENCOUNTER — Emergency Department (HOSPITAL_COMMUNITY)
Admission: EM | Admit: 2014-12-16 | Discharge: 2014-12-17 | Disposition: A | Discharge status: Home / Self Care | Payer: Self-pay | Attending: Emergency Medicine | Admitting: Emergency Medicine

## 2014-12-16 ENCOUNTER — Emergency Department (HOSPITAL_COMMUNITY): Payer: Self-pay

## 2014-12-16 ENCOUNTER — Encounter (HOSPITAL_COMMUNITY): Payer: Self-pay | Admitting: *Deleted

## 2014-12-16 DIAGNOSIS — J441 Chronic obstructive pulmonary disease with (acute) exacerbation: Secondary | ICD-10-CM | POA: Insufficient documentation

## 2014-12-16 DIAGNOSIS — R1031 Right lower quadrant pain: Secondary | ICD-10-CM | POA: Insufficient documentation

## 2014-12-16 DIAGNOSIS — Z79899 Other long term (current) drug therapy: Secondary | ICD-10-CM | POA: Insufficient documentation

## 2014-12-16 DIAGNOSIS — Z85828 Personal history of other malignant neoplasm of skin: Secondary | ICD-10-CM | POA: Insufficient documentation

## 2014-12-16 DIAGNOSIS — Z720 Tobacco use: Secondary | ICD-10-CM | POA: Insufficient documentation

## 2014-12-16 DIAGNOSIS — F1310 Sedative, hypnotic or anxiolytic abuse, uncomplicated: Secondary | ICD-10-CM | POA: Insufficient documentation

## 2014-12-16 DIAGNOSIS — I471 Supraventricular tachycardia: Secondary | ICD-10-CM | POA: Insufficient documentation

## 2014-12-16 DIAGNOSIS — R1011 Right upper quadrant pain: Secondary | ICD-10-CM | POA: Insufficient documentation

## 2014-12-16 DIAGNOSIS — Z7951 Long term (current) use of inhaled steroids: Secondary | ICD-10-CM | POA: Insufficient documentation

## 2014-12-16 DIAGNOSIS — F1110 Opioid abuse, uncomplicated: Secondary | ICD-10-CM | POA: Insufficient documentation

## 2014-12-16 LAB — URINALYSIS, ROUTINE W REFLEX MICROSCOPIC
Bilirubin Urine: NEGATIVE
Glucose, UA: NEGATIVE mg/dL
Ketones, ur: NEGATIVE mg/dL
Leukocytes, UA: NEGATIVE
Nitrite: NEGATIVE
Protein, ur: NEGATIVE mg/dL
SPECIFIC GRAVITY, URINE: 1.004 — AB (ref 1.005–1.030)
UROBILINOGEN UA: 1 mg/dL (ref 0.0–1.0)
pH: 6 (ref 5.0–8.0)

## 2014-12-16 LAB — CBC WITH DIFFERENTIAL/PLATELET
BASOS PCT: 0 % (ref 0–1)
Basophils Absolute: 0 10*3/uL (ref 0.0–0.1)
EOS PCT: 1 % (ref 0–5)
Eosinophils Absolute: 0.1 10*3/uL (ref 0.0–0.7)
HCT: 45.4 % (ref 39.0–52.0)
HEMOGLOBIN: 15.2 g/dL (ref 13.0–17.0)
LYMPHS PCT: 21 % (ref 12–46)
Lymphs Abs: 1.8 10*3/uL (ref 0.7–4.0)
MCH: 33.4 pg (ref 26.0–34.0)
MCHC: 33.5 g/dL (ref 30.0–36.0)
MCV: 99.8 fL (ref 78.0–100.0)
Monocytes Absolute: 0.5 10*3/uL (ref 0.1–1.0)
Monocytes Relative: 6 % (ref 3–12)
NEUTROS ABS: 6.1 10*3/uL (ref 1.7–7.7)
NEUTROS PCT: 72 % (ref 43–77)
PLATELETS: 204 10*3/uL (ref 150–400)
RBC: 4.55 MIL/uL (ref 4.22–5.81)
RDW: 12.3 % (ref 11.5–15.5)
WBC: 8.6 10*3/uL (ref 4.0–10.5)

## 2014-12-16 LAB — COMPREHENSIVE METABOLIC PANEL
ALT: 16 U/L — AB (ref 17–63)
ANION GAP: 10 (ref 5–15)
AST: 29 U/L (ref 15–41)
Albumin: 3.7 g/dL (ref 3.5–5.0)
Alkaline Phosphatase: 89 U/L (ref 38–126)
BILIRUBIN TOTAL: 0.6 mg/dL (ref 0.3–1.2)
BUN: 10 mg/dL (ref 6–20)
CHLORIDE: 109 mmol/L (ref 101–111)
CO2: 23 mmol/L (ref 22–32)
CREATININE: 0.88 mg/dL (ref 0.61–1.24)
Calcium: 9.3 mg/dL (ref 8.9–10.3)
GLUCOSE: 91 mg/dL (ref 65–99)
POTASSIUM: 3.7 mmol/L (ref 3.5–5.1)
SODIUM: 142 mmol/L (ref 135–145)
TOTAL PROTEIN: 7.6 g/dL (ref 6.5–8.1)

## 2014-12-16 LAB — RAPID URINE DRUG SCREEN, HOSP PERFORMED
Amphetamines: NOT DETECTED
Barbiturates: NOT DETECTED
Benzodiazepines: POSITIVE — AB
COCAINE: NOT DETECTED
Opiates: POSITIVE — AB
TETRAHYDROCANNABINOL: NOT DETECTED

## 2014-12-16 LAB — URINE MICROSCOPIC-ADD ON

## 2014-12-16 LAB — LIPASE, BLOOD: LIPASE: 39 U/L (ref 22–51)

## 2014-12-16 MED ORDER — ONDANSETRON HCL 4 MG/2ML IJ SOLN
4.0000 mg | Freq: Once | INTRAMUSCULAR | Status: DC
  Filled 2014-12-16: qty 2

## 2014-12-16 MED ORDER — IOHEXOL 300 MG/ML  SOLN
50.0000 mL | Freq: Once | INTRAMUSCULAR | Status: AC | PRN
Start: 2014-12-16 — End: 2014-12-16
  Administered 2014-12-16: 50 mL via ORAL

## 2014-12-16 MED ORDER — IOHEXOL 300 MG/ML  SOLN
100.0000 mL | Freq: Once | INTRAMUSCULAR | Status: AC | PRN
  Administered 2014-12-16: 100 mL via INTRAVENOUS

## 2014-12-16 MED ORDER — SODIUM CHLORIDE 0.9 % IV BOLUS (SEPSIS)
2000.0000 mL | Freq: Once | INTRAVENOUS | Status: AC
  Administered 2014-12-16: 2000 mL via INTRAVENOUS

## 2014-12-16 MED ORDER — HYDROMORPHONE HCL 1 MG/ML IJ SOLN
1.0000 mg | Freq: Once | INTRAMUSCULAR | Status: DC
  Filled 2014-12-16: qty 1

## 2014-12-16 NOTE — ED Notes (Signed)
Pt states that he has been short of breath all day; pt denies chest pain; pt ca,e to the ER with complaints of blood to his urine; pt with shallow respirations and taking deep breaths every few breaths; pt HR in triage 180's

## 2014-12-16 NOTE — ED Notes (Signed)
This Probation officer in to administer medications and noticed an insulin syringe wrapper under pt's leg. This Probation officer noticed pt has a syringe in left hand. Charge nurse in to assist. Pt told charge nurse he had heroin in syringe and shot up in left ankle. EDP, Tyrone Nine made aware of same. Verbal orders to hold Dilaudid and Zofran.

## 2014-12-16 NOTE — ED Provider Notes (Addendum)
CSN: [id number]     Arrival date & time 12/16/14  1947 History   First MD Initiated Contact with Patient 12/16/14 2009     Chief Complaint  Patient presents with  . Tachycardia  . Shortness of Breath     (Consider location/radiation/quality/duration/timing/severity/associated sxs/prior Treatment) Patient is a 59 y.o. male presenting with shortness of breath.  Shortness of Breath Severity:  Moderate Onset quality:  Sudden Duration:  1 day Timing:  Constant Progression:  Unchanged Chronicity:  New Relieved by:  Nothing Worsened by:  Nothing tried Ineffective treatments:  None tried Associated symptoms: abdominal pain   Associated symptoms: no chest pain, no fever, no headaches, no rash and no vomiting     59 yo M with a chief complaint of weakness hematuria and right lower quadrant abdominal pain. This all started this morning and has been progressively getting worse. Patient sought emergency care after he became more singly short of breath and he was scared about the blood in his urine. Patient denies fevers chills. Patient has had normal bowel movements today. Patient is been eating and drinking normally today. Patient denies illegal drug use though has scars throughout his bilateral ACs.  Past Medical History  Diagnosis Date  . COPD (chronic obstructive pulmonary disease)   . Basal cell carcinoma   . Depression    Past Surgical History  Procedure Laterality Date  . Hernia repair     Family History  Problem Relation Age of Onset  . Cancer Other    History  Substance Use Topics  . Smoking status: Current Every Day Smoker -- 0.50 packs/day    Types: Cigarettes  . Smokeless tobacco: Never Used  . Alcohol Use: Yes     Comment: social    Review of Systems  Constitutional: Negative for fever and chills.  HENT: Negative for congestion and facial swelling.   Eyes: Negative for discharge and visual disturbance.  Respiratory: Positive for shortness of breath.    Cardiovascular: Negative for chest pain and palpitations.  Gastrointestinal: Positive for nausea and abdominal pain. Negative for vomiting and diarrhea.  Musculoskeletal: Negative for myalgias and arthralgias.  Skin: Negative for color change and rash.  Neurological: Positive for weakness. Negative for tremors, syncope and headaches.  Psychiatric/Behavioral: Negative for confusion and dysphoric mood.      Allergies  Review of patient's allergies indicates no known allergies.  Home Medications   Prior to Admission medications   Medication Sig Start Date End Date Taking? Authorizing Provider  albuterol (PROVENTIL HFA;VENTOLIN HFA) 108 (90 BASE) MCG/ACT inhaler Inhale 2 puffs into the lungs every 4 (four) hours as needed for wheezing or shortness of breath.   Yes Historical Provider, MD  Fluticasone Furoate-Vilanterol (BREO ELLIPTA) 100-25 MCG/INH AEPB Inhale 1 puff into the lungs daily.   Yes Historical Provider, MD  HYDROcodone-acetaminophen (NORCO/VICODIN) 5-325 MG per tablet Take 1-2 tablets by mouth every 4 (four) hours as needed for moderate pain. 08/14/14  Yes Belkys A Regalado, MD  ibuprofen (ADVIL,MOTRIN) 200 MG tablet Take 4 tablets (800 mg total) by mouth every 8 (eight) hours as needed. For pain 08/14/14  Yes Belkys A Regalado, MD  metoprolol tartrate (LOPRESSOR) 25 MG tablet Take 1 tablet (25 mg total) by mouth 2 (two) times daily. 08/14/14  Yes Belkys A Regalado, MD  Multiple Vitamin (MULITIVITAMIN WITH MINERALS) TABS Take 1 tablet by mouth daily.   Yes Historical Provider, MD  Ascorbic Acid (VITAMIN C) 1000 MG tablet Take 1,000 mg by mouth daily.  Historical Provider, MD  sulfamethoxazole-trimethoprim (BACTRIM DS,SEPTRA DS) 800-160 MG per tablet Take 1 tablet by mouth 2 (two) times daily. Patient not taking: Reported on 12/16/2014 08/14/14   Belkys A Regalado, MD   BP 124/83 mmHg  Pulse 93  Temp(Src) 98.1 F (36.7 C) (Oral)  Resp 30  SpO2 95% Physical Exam   Constitutional: He is oriented to person, place, and time. He appears well-developed and well-nourished.  HENT:  Head: Normocephalic and atraumatic.  Eyes: EOM are normal. Pupils are equal, round, and reactive to light.  Neck: Normal range of motion. Neck supple. No JVD present.  Cardiovascular: Regular rhythm.  Tachycardia present.  Exam reveals no gallop and no friction rub.   No murmur heard. Pulmonary/Chest: No respiratory distress. He has no wheezes.  Abdominal: He exhibits no distension. There is tenderness (worse in the right lower quadrant). There is no rebound and no guarding.  Musculoskeletal: Normal range of motion.  Neurological: He is alert and oriented to person, place, and time.  Skin: No rash noted. No pallor.  Scarring track marks in the bilateral ACs  Psychiatric: He has a normal mood and affect. His behavior is normal.    ED Course  Procedures (including critical care time) Labs Review Labs Reviewed  COMPREHENSIVE METABOLIC PANEL - Abnormal; Notable for the following:    ALT 16 (*)    All other components within normal limits  URINALYSIS, ROUTINE W REFLEX MICROSCOPIC (NOT AT [facility]) - Abnormal; Notable for the following:    Specific Gravity, Urine 1.004 (*)    Hgb urine dipstick LARGE (*)    All other components within normal limits  URINE RAPID DRUG SCREEN, HOSP PERFORMED - Abnormal; Notable for the following:    Opiates POSITIVE (*)    Benzodiazepines POSITIVE (*)    All other components within normal limits  CBC WITH DIFFERENTIAL/PLATELET  LIPASE, BLOOD  URINE MICROSCOPIC-ADD ON    Imaging Review Ct Abdomen Pelvis W Contrast  12/16/2014   CLINICAL DATA:  Acute onset of upper abdominal pain. Initial encounter.  EXAM: CT ABDOMEN AND PELVIS WITH CONTRAST  TECHNIQUE: Multidetector CT imaging of the abdomen and pelvis was performed using the standard protocol following bolus administration of intravenous contrast.  CONTRAST:  132m OMNIPAQUE IOHEXOL 300 MG/ML   SOLN  COMPARISON:  CT of the abdomen and pelvis from 06/27/2011  FINDINGS: The visualized lung bases are clear.  The liver and spleen are unremarkable in appearance. The gallbladder is within normal limits. The pancreas and adrenal glands are unremarkable.  A 2.1 cm cyst is noted at the interpole region of the left kidney. A smaller 1.3 cm cyst is noted at the upper pole of the right kidney. The kidneys are otherwise unremarkable. There is no evidence of hydronephrosis. No renal or ureteral stones are seen. No perinephric stranding is appreciated.  No free fluid is identified. The small bowel is unremarkable in appearance. The stomach is within normal limits. No acute vascular abnormalities are seen. Mild scattered calcification is noted along the distal abdominal aorta and its branches.  The appendix is normal in caliber, without evidence of appendicitis. Scattered diverticulosis is noted along the distal descending and sigmoid colon, without evidence of diverticulitis.  The bladder is mildly distended and grossly unremarkable. The prostate remains normal in size. A small left inguinal hernia is seen, containing only fat. No inguinal lymphadenopathy is seen.  No acute osseous abnormalities are identified. Multilevel vacuum phenomenon is noted at the lower lumbar spine, with underlying facet  disease.  IMPRESSION: 1. No acute abnormality seen to explain the patient's symptoms. 2. Small bilateral renal cysts seen. 3. Mild scattered calcification along the distal abdominal aorta and its branches. 4. Scattered diverticulosis along the distal descending and sigmoid colon, without evidence of diverticulitis. 5. Small left inguinal hernia, containing only fat. 6. Minimal degenerative change at the lower lumbar spine.   Electronically Signed   By: Garald Balding M.D.   On: 12/16/2014 23:54     EKG Interpretation   Date/Time:  Monday December 16 2014 20:08:26 EDT Ventricular Rate:  173 PR Interval:  134 QRS Duration:  80 QT Interval:  286 QTC Calculation: 485 R Axis:   75 Text Interpretation:  Supraventricular tachycardia Minimal ST depression,  inferior leads No significant change since last tracing Confirmed by Waldo Damian  MD, Quillian Quince ([id number]) on 12/16/2014 8:24:01 PM      MDM   Final diagnoses:  SVT (supraventricular tachycardia)  Right upper quadrant pain    59 yo M with chief complaints of hematuria and right lower quadrant pain. Patient in SVT at a rate of 170 on arrival to the ED. Improved with vagal maneuvers with passive leg raise. Patient sinus tachycardia on repeat with a rate of 122. Complaining of right lower quadrant abdominal pain seems severe nature. Patient with bilateral ACs with scarring patient denies illegal drug use though feel the possibility of intra-abdominal abscess ruled out as well as appendicitis. Will obtain a CT scan abdomen and pelvis. Due to patient's pain and nausea give 2 L IV fluids.  Patient found using hypodermic needles in the room to inject heroin. [name] tachycardia spontaneously resolved. Drug screen positive for opiates and benzos. Patient with negative UA for infection. Large blood on dipstick. Lipase normal CMP without acute abnormality.  CRITICAL CARE Performed by: Cecilio Asper   Total critical care time: 79mn   Critical care time was exclusive of separately billable procedures and treating other patients.  Critical care was necessary to treat or prevent imminent or life-threatening deterioration.  Critical care was time spent personally by me on the following activities: development of treatment plan with patient and/or surrogate as well as nursing, discussions with consultants, evaluation of patient's response to treatment, examination of patient, obtaining history from patient or surrogate, ordering and performing treatments and interventions, ordering and review of laboratory studies, ordering and review of radiographic studies, pulse oximetry and  re-evaluation of patient's condition.   Patient improved clinically with pain medicine and IV fluids.  CT scan without acute abnormality.  Bedside monitor charting patient's respiratory rate in the 30s however on my reexamination patient respiratory rate was 14. Discussed results with patient, will d/c home.  12:23 AM:  I have discussed the diagnosis/risks/treatment options with the patient and believe the pt to be eligible for discharge home to follow-up with PCP. We also discussed returning to the ED immediately if new or worsening sx occur. We discussed the sx which are most concerning (e.g., sudden worsening pain) that necessitate immediate return. Medications administered to the patient during their visit and any new prescriptions provided to the patient are listed below.  Medications given during this visit Medications  HYDROmorphone (DILAUDID) injection 1 mg (1 mg Intravenous Not Given 12/16/14 2311)  ondansetron (ZOFRAN) injection 4 mg (4 mg Intravenous Not Given 12/16/14 2311)  sodium chloride 0.9 % bolus 2,000 mL (2,000 mLs Intravenous New Bag/Given 12/16/14 2037)  iohexol (OMNIPAQUE) 300 MG/ML solution 50 mL (50 mLs Oral Contrast Given 12/16/14 2330)  iohexol (  OMNIPAQUE) 300 MG/ML solution 100 mL (100 mLs Intravenous Contrast Given 12/16/14 2331)    New Prescriptions   No medications on file     The patient appears reasonably screen and/or stabilized for discharge and I doubt any other medical condition or other [facility] requiring further screening, evaluation, or treatment in the ED at this time prior to discharge.     Deno Etienne, DO 12/17/14 [place], DO 12/17/14 4540

## 2014-12-16 NOTE — ED Notes (Signed)
After finding insulin needle on pt's person twice.  Off duty [employer] and security was sent at bedside to search pt's belonging and wand him.  Per [employer], pt verbalized to him that the last time he used heroin was yesterday.  However, pt admitted to this nurse of injecting heroin in his L foot after he was asked several times.  Tyrone Nine EDP made aware

## 2014-12-17 NOTE — Discharge Instructions (Signed)

## 2015-11-20 ENCOUNTER — Ambulatory Visit: Payer: Self-pay

## 2016-02-23 ENCOUNTER — Emergency Department (HOSPITAL_BASED_OUTPATIENT_CLINIC_OR_DEPARTMENT_OTHER)
Admission: EM | Admit: 2016-02-23 | Discharge: 2016-02-23 | Disposition: A | Discharge status: Home / Self Care | Payer: Self-pay | Attending: Emergency Medicine | Admitting: Emergency Medicine

## 2016-02-23 ENCOUNTER — Encounter (HOSPITAL_BASED_OUTPATIENT_CLINIC_OR_DEPARTMENT_OTHER): Payer: Self-pay | Admitting: Respiratory Therapy

## 2016-02-23 DIAGNOSIS — Z760 Encounter for issue of repeat prescription: Secondary | ICD-10-CM | POA: Insufficient documentation

## 2016-02-23 DIAGNOSIS — J449 Chronic obstructive pulmonary disease, unspecified: Secondary | ICD-10-CM | POA: Insufficient documentation

## 2016-02-23 DIAGNOSIS — R062 Wheezing: Secondary | ICD-10-CM | POA: Insufficient documentation

## 2016-02-23 DIAGNOSIS — F17210 Nicotine dependence, cigarettes, uncomplicated: Secondary | ICD-10-CM | POA: Insufficient documentation

## 2016-02-23 MED ORDER — IPRATROPIUM-ALBUTEROL 0.5-2.5 (3) MG/3ML IN SOLN
RESPIRATORY_TRACT | Status: AC
  Administered 2016-02-23: 3 mL
  Filled 2016-02-23: qty 3

## 2016-02-23 MED ORDER — ALBUTEROL SULFATE HFA 108 (90 BASE) MCG/ACT IN AERS
2.0000 | INHALATION_SPRAY | Freq: Four times a day (QID) | RESPIRATORY_TRACT | Status: DC | PRN
  Administered 2016-02-23: 2 via RESPIRATORY_TRACT
  Filled 2016-02-23: qty 6.7

## 2016-02-23 MED ORDER — ALBUTEROL SULFATE HFA 108 (90 BASE) MCG/ACT IN AERS: 2.0000 | INHALATION_SPRAY | RESPIRATORY_TRACT | 3 refills | Status: DC | PRN

## 2016-02-23 NOTE — ED Triage Notes (Signed)
Pt from [facility].  States that he is here for verification of his medication so that he can take them at the facility.  Inhaler and nebulizer used at home daily.  RT at bedside.

## 2016-02-23 NOTE — ED Notes (Signed)
Patient from [facility], taking HFA for increased WOB, not allowed to have personal Nebulizer in facility without orders.  Patient with faint expiratory wheeze and deceased BS in triage.  Given Duoneb in fast track area.

## 2016-02-23 NOTE — ED Notes (Signed)
Called [facility], spoke with Anderson Malta regarding pt being picked up and pt continuing to take his inhaler as needed.

## 2016-02-23 NOTE — ED Provider Notes (Signed)
[place] DEPT [facility] Provider Note   CSN: [id number] Arrival date & time: 02/23/16  1616  By signing my name below, I, Omar Wagner and Omar Wagner, attest that this documentation has been prepared under the direction and in the presence of Montine Circle, PA-C. Electronically Signed: Macon Wagner and Omar Wagner, ED Scribe. 02/23/16. 4:40 PM.  History   Chief Complaint Chief Complaint  Patient presents with  . Shortness of Breath   The history is provided by the patient. No language interpreter was used.    HPI Comments: Omar Wagner is a 60 y.o. male with h/o COPD who presents to the Emergency Department for verification of PRN inhaler for use at [facility] facility. He states he uses his inhaler PRN for treatment of occasional wheezing. Pt reports he was told by the facility that his inhaler needs to have a label with his dosage information in order for him to be able to take it there. Pt reports he is currently taking a Prednisone taper at this time. He also reports he takes DuoNeb tx and Advair PRN, which provide him relief of his symptoms. Pt states he quit smoking a week ago. He denies fever, additional complaints.    Past Medical History:  Diagnosis Date  . Basal cell carcinoma   . COPD (chronic obstructive pulmonary disease) ([place])   . Depression     Patient Active Problem List   Diagnosis Date Noted  . Cellulitis and abscess of leg 08/10/2014  . Bacteremia 01/17/2013  . Heroin withdrawal ([place]) 01/14/2013  . Acute diarrhea 01/13/2013  . Hypokalemia 01/13/2013  . Aspiration into airway 01/12/2013  . Substance abuse 01/12/2013  . Acute encephalopathy 01/11/2013  . Cellulitis of multiple sites of lower extremity 01/11/2013    Past Surgical History:  Procedure Laterality Date  . HERNIA REPAIR         Home Medications    Prior to Admission medications   Medication Sig Start Date End Date Taking? Authorizing Provider  albuterol (PROVENTIL HFA;VENTOLIN  HFA) 108 (90 BASE) MCG/ACT inhaler Inhale 2 puffs into the lungs every 4 (four) hours as needed for wheezing or shortness of breath.    Historical Provider, MD  albuterol (PROVENTIL HFA;VENTOLIN HFA) 108 (90 Base) MCG/ACT inhaler Inhale 2 puffs into the lungs every 4 (four) hours as needed for wheezing or shortness of breath. 02/23/16   Montine Circle, PA-C  Ascorbic Acid (VITAMIN C) 1000 MG tablet Take 1,000 mg by mouth daily.     Historical Provider, MD  Fluticasone Furoate-Vilanterol (BREO ELLIPTA) 100-25 MCG/INH AEPB Inhale 1 puff into the lungs daily.    Historical Provider, MD  metoprolol tartrate (LOPRESSOR) 25 MG tablet Take 1 tablet (25 mg total) by mouth 2 (two) times daily. 08/14/14   Belkys A Regalado, MD  Multiple Vitamin (MULITIVITAMIN WITH MINERALS) TABS Take 1 tablet by mouth daily.    Historical Provider, MD    Family History Family History  Problem Relation Age of Onset  . Cancer Other     Social History Social History  Substance Use Topics  . Smoking status: Current Every Day Smoker    Packs/day: 0.50    Types: Cigarettes  . Smokeless tobacco: Never Used  . Alcohol use Yes     Comment: social     Allergies   Review of patient's allergies indicates no known allergies.   Review of Systems Review of Systems  Constitutional: Negative for fever.  Respiratory: Positive for wheezing.     Physical Exam  Updated Vital Signs BP (!) 154/101 (BP Location: Left Arm)   Pulse 104   Temp 98.2 F (36.8 C) (Oral)   Resp 22   Ht 6' (1.829 m)   Wt 170 lb (77.1 kg)   SpO2 96%   BMI 23.06 kg/m   Physical Exam  Constitutional: He is oriented to person, place, and time. He appears well-developed and well-nourished.  HENT:  Head: Normocephalic and atraumatic.  Eyes: Conjunctivae and EOM are normal. Pupils are equal, round, and reactive to light. Right eye exhibits no discharge. Left eye exhibits no discharge. No scleral icterus.  Neck: Normal range of motion. Neck  supple. No JVD present.  Cardiovascular: Normal rate, regular rhythm and normal heart sounds.  Exam reveals no gallop and no friction rub.   No murmur heard. Pulmonary/Chest: Effort normal and breath sounds normal. No respiratory distress. He has no wheezes. He has no rales. He exhibits no tenderness.  Mild left sided wheeze  Abdominal: Soft. He exhibits no distension and no mass. There is no tenderness. There is no rebound and no guarding.  Musculoskeletal: Normal range of motion. He exhibits no edema or tenderness.  Neurological: He is alert and oriented to person, place, and time.  Skin: Skin is warm and dry.  Psychiatric: He has a normal mood and affect. His behavior is normal. Judgment and thought content normal.  Nursing note and vitals reviewed.    ED Treatments / Results   DIAGNOSTIC STUDIES: Oxygen Saturation is 95% on RA, adequate by my interpretation.    COORDINATION OF CARE: 4:38 PM Discussed treatment plan with pt at bedside which includes prescription refill and pt agreed to plan.   Labs (all labs ordered are listed, but only abnormal results are displayed) Labs Reviewed - No data to display  EKG  EKG Interpretation None       Radiology No results found.  Procedures Procedures (including critical care time)  Medications Ordered in ED Medications  ipratropium-albuterol (DUONEB) 0.5-2.5 (3) MG/3ML nebulizer solution (3 mLs  Given 02/23/16 1630)     Initial Impression / Assessment and Plan / ED Course  I have reviewed the triage vital signs and the nursing notes.  Pertinent labs & imaging results that were available during my care of the patient were reviewed by me and considered in my medical decision making (see chart for details).  Clinical Course    Mild left sided wheeze, improved with breathing treatment.  Only needs verification that his inhaler is what he says it is.    Pharmacy cannot place a new label on outside meds.  Will prescribe new  inhaler.  Final Clinical Impressions(s) / ED Diagnoses   Final diagnoses:  Wheezing  Medication refill    New Prescriptions New Prescriptions   ALBUTEROL (PROVENTIL HFA;VENTOLIN HFA) 108 (90 BASE) MCG/ACT INHALER    Inhale 2 puffs into the lungs every 4 (four) hours as needed for wheezing or shortness of breath.   I personally performed the services described in this documentation, which was scribed in my presence. The recorded information has been reviewed and is accurate.      Montine Circle, PA-C 02/23/16 1656    Veryl Speak, MD 02/24/16 [phone]

## 2016-07-08 ENCOUNTER — Emergency Department (HOSPITAL_COMMUNITY)
Admission: EM | Admit: 2016-07-08 | Discharge: 2016-07-08 | Disposition: A | Discharge status: Home / Self Care | Payer: Self-pay | Attending: Emergency Medicine | Admitting: Emergency Medicine

## 2016-07-08 ENCOUNTER — Encounter (HOSPITAL_COMMUNITY): Payer: Self-pay | Admitting: Emergency Medicine

## 2016-07-08 DIAGNOSIS — H269 Unspecified cataract: Secondary | ICD-10-CM | POA: Insufficient documentation

## 2016-07-08 DIAGNOSIS — F17210 Nicotine dependence, cigarettes, uncomplicated: Secondary | ICD-10-CM | POA: Insufficient documentation

## 2016-07-08 DIAGNOSIS — J449 Chronic obstructive pulmonary disease, unspecified: Secondary | ICD-10-CM | POA: Insufficient documentation

## 2016-07-08 DIAGNOSIS — Z8709 Personal history of other diseases of the respiratory system: Secondary | ICD-10-CM

## 2016-07-08 NOTE — Discharge Instructions (Signed)
Please follow up with the specialists as indicated on this paper. Return to the ER for new or worsening symptoms.

## 2016-07-08 NOTE — ED Triage Notes (Addendum)
Patient c/o right eye pain with blurred vision in right eye x 6 months. States "I know it is a cataract." Patient reports "I need a referral to a ophthalmologist and also a pulmonologist because I have COPD." Denies SOB, chest pain, abdominal pain, N/V/D.

## 2016-07-08 NOTE — ED Provider Notes (Signed)
[place] DEPT Provider Note   CSN: [id number] Arrival date & time: 07/08/16  1425  By signing my name below, I, [name], attest that this documentation has been prepared under the direction and in the presence of Kaleya Douse Y. Gracelyn Coventry, PA-C. Electronically Signed: [name], Scribe. 07/08/2016. 3:10 PM.   History   Chief Complaint Chief Complaint  Patient presents with  . Eye Pain   The history is provided by the patient. No language interpreter was used.   HPI Comments:  ERVEN Wagner is a 61 y.o. male with PMHx of COPD, who presents to the Emergency Department complaining of constant, gradually worsening, right eye pain onset six months. Pt states "I know it is a cataract" and would like a referral to an ophthalmologist as well as a referral to pulmonologist for his COPD. He reports blurriness in his right eye that worsens in different types of light as well as beginning left eye "funniness". He also reports taking Advair for a year and half. 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 He also states he is an avid reader but his blurriness makes reading hard, "I would just like this problem fixed." Pt denies SOB, CP, abdominal pain, nausea, vomiting, diarrhea, and any other associated symptoms at this time. Pt is former smoker, quit smoking 4 months ago.   Past Medical History:  Diagnosis Date  . Basal cell carcinoma   . COPD (chronic obstructive pulmonary disease) ([place])   . Depression     Patient Active Problem List   Diagnosis Date Noted  . Cellulitis and abscess of leg 08/10/2014  . Bacteremia 01/17/2013  . Heroin withdrawal ([place]) 01/14/2013  . Acute diarrhea 01/13/2013  . Hypokalemia 01/13/2013  . Aspiration into airway 01/12/2013  . Substance abuse 01/12/2013  . Acute encephalopathy 01/11/2013  . Cellulitis of multiple sites of lower extremity 01/11/2013    Past Surgical History:  Procedure Laterality Date  . HERNIA  REPAIR         Home Medications    Prior to Admission medications   Medication Sig Start Date End Date Taking? Authorizing Provider  albuterol (PROVENTIL HFA;VENTOLIN HFA) 108 (90 BASE) MCG/ACT inhaler Inhale 2 puffs into the lungs every 4 (four) hours as needed for wheezing or shortness of breath.    Historical Provider, MD  albuterol (PROVENTIL HFA;VENTOLIN HFA) 108 (90 Base) MCG/ACT inhaler Inhale 2 puffs into the lungs every 4 (four) hours as needed for wheezing or shortness of breath. 02/23/16   Montine Circle, PA-C  Ascorbic Acid (VITAMIN C) 1000 MG tablet Take 1,000 mg by mouth daily.     Historical Provider, MD  Fluticasone Furoate-Vilanterol (BREO ELLIPTA) 100-25 MCG/INH AEPB Inhale 1 puff into the lungs daily.    Historical Provider, MD  metoprolol tartrate (LOPRESSOR) 25 MG tablet Take 1 tablet (25 mg total) by mouth 2 (two) times daily. 08/14/14   Belkys A Regalado, MD  Multiple Vitamin (MULITIVITAMIN WITH MINERALS) TABS Take 1 tablet by mouth daily.    Historical Provider, MD    Family History Family History  Problem Relation Age of Onset  . Cancer Other     Social History Social History  Substance Use Topics  . Smoking status: Current Every Day Smoker    Packs/day: 0.50    Types: Cigarettes  . Smokeless tobacco: Never Used  . Alcohol use Yes     Comment: social     Allergies   Patient has  no known allergies.   Review of Systems Review of Systems 10 Systems reviewed and are negative for acute change except as noted in the HPI.   Physical Exam Updated Vital Signs BP 119/83   Pulse 84   Temp 98.4 F (36.9 C)   Resp 16   Ht 6' (1.829 m)   Wt 175 lb (79.4 kg)   SpO2 97%   BMI 23.73 kg/m   Physical Exam  Constitutional: He is oriented to person, place, and time. He appears well-developed and well-nourished.  HENT:  Head: Normocephalic and atraumatic.  Right Ear: External ear normal.  Left Ear: External ear normal.  Nose: Nose normal.  Eyes:  Conjunctivae and EOM are normal. Pupils are equal, round, and reactive to light. Right eye exhibits no discharge. Left eye exhibits no discharge.  Right eye cataracts  Neck: Neck supple.  Cardiovascular: Normal rate, regular rhythm and normal heart sounds.   Pulmonary/Chest: Effort normal and breath sounds normal. No respiratory distress. He has no wheezes. He has no rales.  Abdominal: Soft. There is no tenderness.  Musculoskeletal: He exhibits no edema.  Neurological: He is alert and oriented to person, place, and time.  Skin: Skin is warm and dry.  Nursing note and vitals reviewed.   Visual Acuity  Right Eye Distance:   Left Eye Distance:   Bilateral Distance:    Right Eye Near: R Near: 20/200 Left Eye Near:  L Near: 20/30 Bilateral Near:  20/30    ED Treatments / Results  DIAGNOSTIC STUDIES:  Oxygen Saturation is 97% on RA, normal by my interpretation.    COORDINATION OF CARE:  3:09 PM Discussed treatment plan with pt at bedside including referral to ophthalmologist and vision exam and pt agreed to plan.  Labs (all labs ordered are listed, but only abnormal results are displayed) Labs Reviewed - No data to display  EKG  EKG Interpretation None       Radiology No results found.  Procedures Procedures (including critical care time)  Medications Ordered in ED Medications - No data to display   Initial Impression / Assessment and Plan / ED Course  I have reviewed the triage vital signs and the nursing notes.  Pertinent labs & imaging results that were available during my care of the patient were reviewed by me and considered in my medical decision making (see chart for details).    Pt presenting for referrals to ophtho and pulm for chronic complaints including cataracts and h/o COPD. No acute complaints today. Sees Mary ann [name] and states he's been waiting to get into specialists she referred him to. i've given pt f/u info for ophtho and pulm but we did  discuss that these are not formal referrals. I also encouraged him to call his case manager listed on his orange card. Return precautions given.  Final Clinical Impressions(s) / ED Diagnoses   Final diagnoses:  Cataract of both eyes, unspecified cataract type  History of COPD    New Prescriptions Discharge Medication List as of 07/08/2016  3:58 PM     I personally performed the services described in this documentation, which was scribed in my presence. The recorded information has been reviewed and is accurate.    Anne Ng, PA-C 07/08/16 [place], MD 07/09/16 [phone]

## 2016-08-02 ENCOUNTER — Encounter: Payer: Self-pay | Admitting: Internal Medicine

## 2016-08-02 ENCOUNTER — Ambulatory Visit (INDEPENDENT_AMBULATORY_CARE_PROVIDER_SITE_OTHER): Payer: No Typology Code available for payment source | Admitting: Internal Medicine

## 2016-08-02 VITALS — BP 110/66 | HR 82 | Ht 72.0 in | Wt 191.0 lb

## 2016-08-02 DIAGNOSIS — J449 Chronic obstructive pulmonary disease, unspecified: Secondary | ICD-10-CM | POA: Insufficient documentation

## 2016-08-02 MED ORDER — GLYCOPYRROLATE-FORMOTEROL 9-4.8 MCG/ACT IN AERO
2.0000 | INHALATION_SPRAY | Freq: Two times a day (BID) | RESPIRATORY_TRACT | 0 refills | Status: DC
Start: 2016-08-02 — End: 2016-09-03

## 2016-08-02 NOTE — Assessment & Plan Note (Signed)
Quit smoking 02/2016 - Spirometry 08/02/2016  FEV1 1.25 (33%)  Ratio 45  p advair am of study  - 08/02/2016  After extensive coaching HFA effectiveness =    75% > try bevespi x 4 week samples   Pt is Group B in terms of symptom/risk and laba/lama therefore appropriate rx at this point.   Not sure what he'll be able to afford but presently over using saba and needs to avoid tachyphylaxis/ reviewed  Total time devoted to counseling  > 50 % of initial 40 min initial  office visit:  review case with pt/ discussion of options/alternatives/ personally creating written customized instructions  in presence of pt  then going over those specific  Instructions directly with the pt including how to use all of the meds but in particular covering each new medication in detail and the difference between the maintenance= "automatic" meds and the prns using an action plan format for the latter (If this problem/symptom => do that organization reading Left to right).  Please see AVS from this visit for a full list of these instructions which I personally wrote for this pt and  are unique to this visit.

## 2016-08-02 NOTE — Patient Instructions (Addendum)
Plan A = Automatic = Stop advair and start Bevespi Take 2 puffs first thing in am and then another 2 puffs about 12 hours later  Plan B = Backup Only use your albuterol (Ventolin) as a rescue medication to be used if you can't catch your breath by resting or doing a relaxed purse lip breathing pattern.  - The less you use it, the better it will work when you need it. - Ok to use the inhaler up to 2 puffs  every 4 hours if you must but call for appointment if use goes up over your usual need - Don't leave home without it !!  (think of it like the spare tire for your car)   Plan C = Crisis - only use your albuterol nebulizer if you first try Plan B and it fails to help > ok to use the nebulizer up to every 4 hours but if start needing it regularly call for immediate appointment  Please schedule a follow up office visit in 4 weeks, sooner if needed Add: needs cxr on return

## 2016-08-02 NOTE — Progress Notes (Signed)
Subjective:     Patient ID: Omar Wagner, male   DOB: [birth year],    MRN: [id number]  HPI   34 yowm quit smoking 02/2016 with onset of doe x 2010 with h/o freq bronchitis episodes prior to quit smoking which is some better but the breathing is not despite advair disc x 2016 and neb daily and saba hfa much less but referred to pulmonary clinic 08/02/2016 by EDP at [facility] p flare 02/23/16   08/02/2016 1st  Pulmonary office visit/ Wert  maint advair 250 /daily neb  Chief Complaint  Patient presents with  . Pulmonary Consult    Self referral for COPD. Pt wants to apply for disability for his COPD.  He c/o SOB for the past 3-4 years. He states breathing bothers him when walking up any sort of incline. He has occ cough and wheezing. He is using albuterol neb and albuterol inhaler once daily on average.    doe = MMRC1 = can walk nl pace, flat grade, can't hurry or go uphills or steps s sob  Also carrying lumber > 5 lb anywhere Onset was insidious x 10 y prior to OV  And progressive with only one flare oin 02/2016 for which rx in ER (see notes) and none since quit smoking   No obvious day to day or daytime variability or assoc excess/ purulent sputum or mucus plugs or hemoptysis or cp or chest tightness, subjective wheeze or overt sinus or hb symptoms. No unusual exp hx or h/o childhood pna/ asthma or knowledge of premature birth.  Sleeping ok without nocturnal  or early am exacerbation  of respiratory  c/o's or need for noct saba. Also denies any obvious fluctuation of symptoms with weather or environmental changes or other aggravating or alleviating factors except as outlined above   Current Medications, Allergies, Complete Past Medical History, Past Surgical History, Family History, and Social History were reviewed in [employer] record.  ROS  The following are not active complaints unless bolded sore throat, dysphagia, dental problems, itching, sneezing,  nasal congestion  or excess/ purulent secretions, ear ache,   fever, chills, sweats, unintended wt loss, classically pleuritic or exertional cp,  orthopnea pnd or leg swelling, presyncope, palpitations, abdominal pain, anorexia, nausea, vomiting, diarrhea  or change in bowel or bladder habits, change in stools or urine, dysuria,hematuria,  rash, arthralgias, visual complaints, headache, numbness, weakness or ataxia or problems with walking or coordination,  change in mood/affect or memory.            Review of Systems     Objective:   Physical Exam [place] amb wm   Wt Readings from Last 3 Encounters:  08/02/16 191 lb (86.6 kg)  07/08/16 175 lb (79.4 kg)  02/23/16 170 lb (77.1 kg)    Vital signs reviewed   HEENT: nl  turbinates bilaterally, and oropharynx. Nl external ear canals without cough reflex- poor dentition   NECK :  without JVD/Nodes/TM/ nl carotid upstrokes bilaterally   LUNGS: no acc muscle use, sltly barrel  contour chest with distant bs bilaterally  without cough on insp or exp maneuvers   CV:  RRR  no s3 or murmur or increase in P2, and no edema   ABD:  soft and nontender with pos hoover's mid to late insp   in the supine position. No bruits or organomegaly appreciated, bowel sounds nl  MS:  Nl gait/ ext warm without deformities, calf tenderness, cyanosis or clubbing No obvious joint restrictions  SKIN: warm and dry without lesions    NEURO:  alert, approp, nl sensorium with  no motor or cerebellar deficits apparent.           Assessment:

## 2016-08-17 ENCOUNTER — Telehealth: Payer: Self-pay | Admitting: Internal Medicine

## 2016-08-17 NOTE — Telephone Encounter (Signed)
lmtcb X1 for Crystal at [facility]

## 2016-08-18 NOTE — Telephone Encounter (Signed)
Spoke with Crystal at [facility], wants to clarify pt's inhalers- pt's PCP started pt on Trelegy and has been shipped to [facility] and is ready to be dispensed.  Pt is currently still taking Bevespi samples as prescribed by MW at last OV. Crystal wants to know if it's ok to switch pt from bevespi to Trelegy- it takes approx 8 weeks for meds to be shipped to patient through [facility] does not want to fill Bevespi until we get clarification from [facility]  Aware that MW is off this week.    MW please advise if ok to switch to Trelegy.  Thanks.   08/02/16 AVS- Patient Instructions   Plan A = Automatic = Stop advair and start Bevespi Take 2 puffs first thing in am and then another 2 puffs about 12 hours later   Plan B = Backup Only use your albuterol (Ventolin) as a rescue medication to be used if you can't catch your breath by resting or doing a relaxed purse lip breathing pattern.  - The less you use it, the better it will work when you need it. - Ok to use the inhaler up to 2 puffs  every 4 hours if you must but call for appointment if use goes up over your usual need - Don't leave home without it !!  (think of it like the spare tire for your car)    Plan C = Crisis - only use your albuterol nebulizer if you first try Plan B and it fails to help > ok to use the nebulizer up to every 4 hours but if start needing it regularly call for immediate appointment   Please schedule a follow up office visit in 4 weeks, sooner if needed Add: needs cxr on return

## 2016-08-19 NOTE — Telephone Encounter (Signed)
lmtcb X1 for Crystal at [facility].

## 2016-08-19 NOTE — Telephone Encounter (Signed)
If trelegy is ready to be dispensed fine to have pt change over to it and stop bevespi

## 2016-08-20 NOTE — Telephone Encounter (Signed)
lmtcb X2 for Crystal with [facility].

## 2016-08-23 NOTE — Telephone Encounter (Signed)
Spoke with Crystal with [facility], and informed her of MW's message. She understood and had no further questions. She will reach out to the pt and pt's pcp about pt being on trelegy and not bevespi. Nothing further is needed at this time.

## 2016-09-02 ENCOUNTER — Telehealth: Payer: Self-pay | Admitting: Internal Medicine

## 2016-09-02 DIAGNOSIS — J449 Chronic obstructive pulmonary disease, unspecified: Secondary | ICD-10-CM

## 2016-09-02 NOTE — Telephone Encounter (Signed)
Called patient and informed him Dr. Melvyn Novas wanted an xray prior to appointment. Pt was instructed to be here at 1355 to have xray performed. Pt verbalized understanding. Xray order entered. Nothing further is needed.

## 2016-09-03 ENCOUNTER — Ambulatory Visit (INDEPENDENT_AMBULATORY_CARE_PROVIDER_SITE_OTHER): Payer: No Typology Code available for payment source | Admitting: Internal Medicine

## 2016-09-03 ENCOUNTER — Ambulatory Visit (INDEPENDENT_AMBULATORY_CARE_PROVIDER_SITE_OTHER)
Admission: RE | Admit: 2016-09-03 | Discharge: 2016-09-03 | Disposition: A | Discharge status: Home / Self Care | Payer: No Typology Code available for payment source | Source: Ambulatory Visit | Attending: Internal Medicine | Admitting: Internal Medicine

## 2016-09-03 ENCOUNTER — Encounter: Payer: Self-pay | Admitting: Internal Medicine

## 2016-09-03 VITALS — BP 124/74 | HR 68 | Ht 72.0 in | Wt 193.0 lb

## 2016-09-03 DIAGNOSIS — J449 Chronic obstructive pulmonary disease, unspecified: Secondary | ICD-10-CM

## 2016-09-03 MED ORDER — GLYCOPYRROLATE-FORMOTEROL 9-4.8 MCG/ACT IN AERO: 2.0000 | INHALATION_SPRAY | Freq: Two times a day (BID) | RESPIRATORY_TRACT | 11 refills | Status: DC

## 2016-09-03 NOTE — Patient Instructions (Signed)
Plan A = Automatic = Stop advair and start Bevespi Take 2 puffs first thing in am and then another 2 puffs about 12 hours later  Plan B = Backup Only use your albuterol (Ventolin) as a rescue medication to be used if you can't catch your breath by resting or doing a relaxed purse lip breathing pattern.  - The less you use it, the better it will work when you need it. - Ok to use the inhaler up to 2 puffs  every 4 hours if you must but call for appointment if use goes up over your usual need - Don't leave home without it !!  (think of it like the spare tire for your car)   Plan C = Crisis - only use your albuterol nebulizer if you first try Plan B and it fails to help > ok to use the nebulizer up to every 4 hours but if start needing it regularly call for immediate appointment   Please schedule a follow up visit in 3 months but call sooner if needed

## 2016-09-03 NOTE — Progress Notes (Signed)
Subjective:     Patient ID: Omar Wagner, male   DOB: 06/14/1955,    MRN: [id number]     Brief patient profile:  36 yowm quit smoking 02/2016 with onset of doe x 2010 with h/o freq bronchitis episodes prior to quit smoking which is some better but the breathing is not despite advair disc x 2016 and neb daily and saba hfa much less but referred to pulmonary clinic 08/02/2016 by EDP at [facility] p flare 02/23/16     History of Present Illness  .08/02/2016 1st [facility] office visit/ Omar Wagner  maint advair 250 /daily neb  Chief Complaint  Patient presents with  . Pulmonary Consult    Self referral for COPD. Pt wants to apply for disability for his COPD.  He c/o SOB for the past 3-4 years. He states breathing bothers him when walking up any sort of incline. He has occ cough and wheezing. He is using albuterol neb and albuterol inhaler once daily on average.   doe = MMRC1 = can walk nl pace, flat grade, can't hurry or go uphills or steps s sob  Also carrying lumber > 5 lb anywhere Onset was insidious x 10 y prior to [place]  And progressive with only one flare oin 02/2016 for which rx in ER (see notes) and none since quit smoking  rec Plan A = Automatic = Stop advair and start Bevespi Take 2 puffs first thing in am and then another 2 puffs about 12 hours later Plan B = Backup Only use your albuterol (Ventolin) as a rescue medication  Plan C = Crisis - only use your albuterol nebulizer if you first try Plan B and it fails to help > ok to use the nebulizer up to every 4 hours but if start needing it regularly call for immediate appointment     09/03/2016  f/u ov/Omar Wagner re:   GOLD III copd/ sp smoking cessation [id number]  Chief Complaint  Patient presents with  . Follow-up    Breathing is some better. No new co's today. He is using ventolin HFA 4 x per wk on average and has not needed to use.   copd  III on trelegy liked  bevespi better   Doe still = MMRC1   No obvious day to day or daytime variability  or assoc excess/ purulent sputum or mucus plugs or hemoptysis or cp or chest tightness, subjective wheeze or overt sinus or hb symptoms. No unusual exp hx or h/o childhood pna/ asthma or knowledge of premature birth.  Sleeping ok without nocturnal  or early am exacerbation  of respiratory  c/o's or need for noct saba. Also denies any obvious fluctuation of symptoms with weather or environmental changes or other aggravating or alleviating factors except as outlined above   Current Medications, Allergies, Complete Past Medical History, Past Surgical History, Family History, and Social History were reviewed in [employer] record.  ROS  The following are not active complaints unless bolded sore throat, dysphagia, dental problems, itching, sneezing,  nasal congestion or excess/ purulent secretions, ear ache,   fever, chills, sweats, unintended wt loss, classically pleuritic or exertional cp,  orthopnea pnd or leg swelling, presyncope, palpitations, abdominal pain, anorexia, nausea, vomiting, diarrhea  or change in bowel or bladder habits, change in stools or urine, dysuria,hematuria,  rash, arthralgias, visual complaints, headache, numbness, weakness or ataxia or problems with walking or coordination,  change in mood/affect or memory.  Objective:   Physical Exam  Somber amb wm    09/03/2016        193  08/02/16 191 lb (86.6 kg)  07/08/16 175 lb (79.4 kg)  02/23/16 170 lb (77.1 kg)    Vital signs reviewed  - Note on arrival 02 sats  98% on RA     HEENT: nl  turbinates bilaterally, and oropharynx. Nl external ear canals without cough reflex- poor dentition on bottom and only a few stumps on top    NECK :  without JVD/Nodes/TM/ nl carotid upstrokes bilaterally   LUNGS: no acc muscle use, sltly barrel  contour chest with distant bs bilaterally  With min mid exp rhonchi bilaterally better with plm   CV:  RRR  no s3 or murmur or increase in P2,  and no edema   ABD:  soft and nontender with pos hoover's mid to late insp   in the supine position. No bruits or organomegaly appreciated, bowel sounds nl  MS:  Nl gait/ ext warm without deformities, calf tenderness, cyanosis or clubbing No obvious joint restrictions   SKIN: warm and dry without lesions    NEURO:  alert, approp, nl sensorium with  no motor or cerebellar deficits apparent.      CXR PA and Lateral:   09/03/2016 :    I personally reviewed images and agree with radiology impression as follows:   COPD changes without acute abnormality.      Assessment:

## 2016-09-03 NOTE — Assessment & Plan Note (Signed)
Quit smoking 02/2016 - Spirometry 08/02/2016  FEV1 1.25 (33%)  Ratio 45  p advair am of study  - 08/02/2016    try bevespi x 4 week samples  - 09/03/2016  After extensive coaching HFA effectiveness =   90% > continue bevespi  Pt is Group B in terms of symptom/risk and laba/lama therefore appropriate rx at this point so continue bevespi when runs out of trelgy samples with the equivalent being anoro unless starts having exacerbations in which case he would be a GROUP D and ICS would need to be added   Each maintenance medication was reviewed in detail including most importantly the difference between maintenance and as needed and under what circumstances the prns are to be used.  Please see AVS for specific  Instructions which are unique to this visit and I personally typed out  which were reviewed in detail in writing with the patient and a copy provided.

## 2016-09-06 NOTE — Progress Notes (Signed)
Spoke with pt and notified of results per Dr. Wert. Pt verbalized understanding and denied any questions. 

## 2016-12-03 ENCOUNTER — Encounter: Payer: Self-pay | Admitting: Internal Medicine

## 2016-12-03 ENCOUNTER — Ambulatory Visit (INDEPENDENT_AMBULATORY_CARE_PROVIDER_SITE_OTHER): Payer: No Typology Code available for payment source | Admitting: Internal Medicine

## 2016-12-03 VITALS — BP 118/76 | HR 77 | Ht 72.0 in | Wt 184.0 lb

## 2016-12-03 DIAGNOSIS — J449 Chronic obstructive pulmonary disease, unspecified: Secondary | ICD-10-CM

## 2016-12-03 MED ORDER — FLUTICASONE-UMECLIDIN-VILANT 100-62.5-25 MCG/INH IN AEPB: 1.0000 | INHALATION_SPRAY | Freq: Every day | RESPIRATORY_TRACT | Status: DC

## 2016-12-03 NOTE — Assessment & Plan Note (Signed)
-   Spirometry 08/02/2016  FEV1 1.25 (33%)  Ratio 45  p advair am of study  - 08/02/2016    try bevespi x 4 week samples  - 09/03/2016  After extensive coaching HFA effectiveness =   90% > continue bevespi - 12/03/2016 changed to trelegy by communtiy wellness > pulmonary f/u prn   Doing ok on trelegy though mildly hoarse >> rec rinse and gargle and if worse consider anoro or back to smi products if covered by community wellness (eg stiolto) or hfa (bevespi)  Advised:  I reviewed again the Fletcher curve with the patient that basically indicates  if you quit smoking when your best day FEV1 is still well preserved (as is clearly  the case here)  it is highly unlikely you will progress to severe disease and informed the patient there was  no medication on the market that has proven to alter the curve/ its downward trajectory  or the likelihood of progression of their disease(unlike other chronic medical conditions such as atheroclerosis where we do think we can change the natural hx with risk reducing meds)    Therefore stopping smoking and maintaining abstinence is the most important aspect of care, not choice of inhalers or for that matter, doctors.    Pulmonary f/u can be prn

## 2016-12-03 NOTE — Progress Notes (Signed)
Subjective:     Patient ID: Omar Wagner, male   DOB: 1955-07-16,    MRN: [id number]     Brief patient profile:  74 yowm quit smoking 02/2016 with onset of doe x 2010 with h/o freq bronchitis episodes prior to quit smoking which is some better but the breathing is not despite advair disc x 2016 and neb daily and saba hfa much less but referred to pulmonary clinic 08/02/2016 by EDP at [facility] p flare 02/23/16     History of Present Illness  .08/02/2016 1st [facility] office visit/ Chianne Byrns  maint advair 250 /daily neb  Chief Complaint  Patient presents with  . Pulmonary Consult    Self referral for COPD. Pt wants to apply for disability for his COPD.  He c/o SOB for the past 3-4 years. He states breathing bothers him when walking up any sort of incline. He has occ cough and wheezing. He is using albuterol neb and albuterol inhaler once daily on average.   doe = MMRC1 = can walk nl pace, flat grade, can't hurry or go uphills or steps s sob  Also carrying lumber > 5 lb anywhere Onset was insidious x 10 y prior to [place]  And progressive with only one flare oin 02/2016 for which rx in ER (see notes) and none since quit smoking  rec Plan A = Automatic = Stop advair and start Bevespi Take 2 puffs first thing in am and then another 2 puffs about 12 hours later Plan B = Backup Only use your albuterol (Ventolin) as a rescue medication  Plan C = Crisis - only use your albuterol nebulizer if you first try Plan B and it fails to help > ok to use the nebulizer up to every 4 hours but if start needing it regularly call for immediate appointment     09/03/2016  f/u ov/Yanique Mulvihill re:   GOLD III copd/ sp smoking cessation [id number]  Chief Complaint  Patient presents with  . Follow-up    Breathing is some better. No new co's today. He is using ventolin HFA 4 x per wk on average and has not needed to use.   copd  III on trelegy liked  bevespi better  Doe still = MMRC1  rec Plan A = Automatic = Stop advair and start  Bevespi Take 2 puffs first thing in am and then another 2 puffs about 12 hours later Plan B = Backup Only use your albuterol (Ventolin) as a rescue medication Plan C = Crisis - only use your albuterol nebulizer if you first try Plan B     12/03/2016  f/u ov/Clydine Parkison re:  GOLD III/ still smoking on trelegy plus prn saba up to 3 x daily hfa only ,no neb  Chief Complaint  Patient presents with  . Follow-up    Breathing is overall doing well. He has noticed occ palpitations since started on Trelegy by his PCP approx 2 months ago. He uses his albuterol inhaler 3 x per wk on average and he has not needed neb.    doe still = MMRC1 = can walk nl pace, flat grade, can't hurry or go uphills or steps s sob   Some hoarseness from trelegy he didn't have on bevespi plus palp noted, better on lopressor tid  No obvious day to day or daytime variability or assoc excess/ purulent sputum or mucus plugs or hemoptysis or cp or chest tightness, subjective wheeze or overt sinus or hb symptoms. No unusual exp hx  or h/o childhood pna/ asthma or knowledge of premature birth.  Sleeping ok without nocturnal  or early am exacerbation  of respiratory  c/o's or need for noct saba. Also denies any obvious fluctuation of symptoms with weather or environmental changes or other aggravating or alleviating factors except as outlined above   Current Medications, Allergies, Complete Past Medical History, Past Surgical History, Family History, and Social History were reviewed in [employer] record.  ROS  The following are not active complaints unless bolded sore throat, dysphagia, dental problems, itching, sneezing,  nasal congestion or excess/ purulent secretions, ear ache,   fever, chills, sweats, unintended wt loss, classically pleuritic or exertional cp,  orthopnea pnd or leg swelling, presyncope, palpitations, abdominal pain, anorexia, nausea, vomiting, diarrhea  or change in bowel or bladder habits, change in  stools or urine, dysuria,hematuria,  rash, arthralgias, visual complaints, headache, numbness, weakness or ataxia or problems with walking or coordination,  change in mood/affect or memory.                            Objective:   Physical Exam  Pleasant mod hoarse  amb wm nad    12/03/2016        184  09/03/2016        193  08/02/16 191 lb (86.6 kg)  07/08/16 175 lb (79.4 kg)  02/23/16 170 lb (77.1 kg)    Vital signs reviewed  - Note on arrival 02 sats  97% on RA     HEENT: nl  turbinates bilaterally, and oropharynx. Nl external ear canals without cough reflex- poor dentition on bottom and only a few stumps on top    NECK :  without JVD/Nodes/TM/ nl carotid upstrokes bilaterally   LUNGS: no acc muscle use, slt barrel  contour chest with distant bs bilaterally  With min mid exp rhonchi bilaterally improve  with plm   CV:  RRR  no s3 or murmur or increase in P2, and no edema   ABD:  soft and nontender with pos hoover's mid to late insp   in the supine position. No bruits or organomegaly appreciated, bowel sounds nl  MS:  Nl gait/ ext warm without deformities, calf tenderness, cyanosis or clubbing No obvious joint restrictions   SKIN: warm and dry without lesions    NEURO:  alert, approp, nl sensorium with  no motor or cerebellar deficits apparent.      CXR PA and Lateral:   09/03/2016 :    I personally reviewed images and agree with radiology impression as follows:   COPD changes without acute abnormality.      Assessment:       Outpatient Encounter Prescriptions as of 12/03/2016  Medication Sig  . albuterol (ACCUNEB) 0.63 MG/3ML nebulizer solution 1 vial in nebulizer every 6 hours as needed  . albuterol (PROVENTIL HFA;VENTOLIN HFA) 108 (90 Base) MCG/ACT inhaler Inhale 2 puffs into the lungs every 4 (four) hours as needed for wheezing or shortness of breath.  . Ascorbic Acid (VITAMIN C) 1000 MG tablet Take 1,000 mg by mouth daily.   . metoprolol tartrate  (LOPRESSOR) 25 MG tablet Take 1 tablet (25 mg total) by mouth 2 (two) times daily. (Patient taking differently: Take 25 mg by mouth 3 (three) times daily. )  . montelukast (SINGULAIR) 10 MG tablet Take 10 mg by mouth at bedtime.  . Multiple Vitamin (MULITIVITAMIN WITH MINERALS) TABS Take 1 tablet by mouth daily.  Marland Kitchen  Potassium (POTASSIMIN PO) Take 1 tablet by mouth daily as needed.  . Fluticasone-Umeclidin-Vilant (TRELEGY ELLIPTA) 100-62.5-25 MCG/INH AEPB Inhale 1 puff into the lungs daily.  . [DISCONTINUED] Glycopyrrolate-Formoterol (BEVESPI AEROSPHERE) 9-4.8 MCG/ACT AERO Inhale 2 puffs into the lungs 2 (two) times daily.   No facility-administered encounter medications on file as of 12/03/2016.

## 2016-12-03 NOTE — Patient Instructions (Signed)
Plan A = Automatic =  Trelegy one click each am then rinse and gargle  Plan B = Backup Only use your albuterol (Ventolin) as a rescue medication to be used if you can't catch your breath by resting or doing a relaxed purse lip breathing pattern.  - The less you use it, the better it will work when you need it. - Ok to use the inhaler up to 2 puffs  every 4 hours if you must but call for appointment if use goes up over your usual need - Don't leave home without it !!  (think of it like the spare tire for your car)   Plan C = Crisis - only use your albuterol nebulizer if you first try Plan B and it fails to help > ok to use the nebulizer up to every 4 hours but if start needing it regularly call for immediate appointment    If you are satisfied with your treatment plan,  let your doctor know and he/she can either refill your medications or you can return here when your prescription runs out.     If in any way you are not 100% satisfied,  please tell us.  If 100% better, tell your friends!  Pulmonary follow up is as needed

## 2017-10-04 ENCOUNTER — Telehealth: Payer: Self-pay | Admitting: Internal Medicine

## 2017-10-04 NOTE — Telephone Encounter (Signed)
Called and spoke to pt.  Pt is requesting refills Trelegy, Metoprolol, singulair, & ibuprofen 800. Pt was last seen 12/03/16, and was instructed to follow as needed.  I have advised pt to contact PCP, as pt denied having any resp symptoms that need to be address by MW.   Pt voiced his understanding and had no further questions.  Nothing further is needed.

## 2017-12-06 ENCOUNTER — Telehealth: Payer: Self-pay | Admitting: Internal Medicine

## 2017-12-06 NOTE — Telephone Encounter (Signed)
Called and spoke with Patient.  Last OV was 12/03/16, with Dr. Melvyn Novas.  Patient is requesting Trelegy and Metoprolol refills. Explained that he needed to have follow up appointment. Patient stated understanding.  Patient is scheduled with Geraldo Pitter, NP at 1130, 12/07/17.  Nothing further at this time.

## 2017-12-07 ENCOUNTER — Other Ambulatory Visit: Payer: Self-pay | Admitting: Primary Care

## 2017-12-07 ENCOUNTER — Encounter: Payer: Self-pay | Admitting: Primary Care

## 2017-12-07 ENCOUNTER — Ambulatory Visit (INDEPENDENT_AMBULATORY_CARE_PROVIDER_SITE_OTHER): Payer: Medicaid Other | Admitting: Primary Care

## 2017-12-07 VITALS — BP 126/68 | HR 83 | Ht 72.0 in | Wt 189.2 lb

## 2017-12-07 DIAGNOSIS — L03119 Cellulitis of unspecified part of limb: Secondary | ICD-10-CM

## 2017-12-07 DIAGNOSIS — R600 Localized edema: Secondary | ICD-10-CM | POA: Diagnosis not present

## 2017-12-07 DIAGNOSIS — J449 Chronic obstructive pulmonary disease, unspecified: Secondary | ICD-10-CM | POA: Diagnosis not present

## 2017-12-07 DIAGNOSIS — R030 Elevated blood-pressure reading, without diagnosis of hypertension: Secondary | ICD-10-CM

## 2017-12-07 DIAGNOSIS — Z0000 Encounter for general adult medical examination without abnormal findings: Secondary | ICD-10-CM

## 2017-12-07 DIAGNOSIS — Z8679 Personal history of other diseases of the circulatory system: Secondary | ICD-10-CM | POA: Insufficient documentation

## 2017-12-07 MED ORDER — METOPROLOL TARTRATE 25 MG PO TABS
25.0000 mg | ORAL_TABLET | Freq: Two times a day (BID) | ORAL | 0 refills | Status: DC
Start: 2017-12-07 — End: 2017-12-07

## 2017-12-07 MED ORDER — FLUTICASONE-UMECLIDIN-VILANT 100-62.5-25 MCG/INH IN AEPB: 1.0000 | INHALATION_SPRAY | Freq: Every day | RESPIRATORY_TRACT | 0 refills | Status: DC

## 2017-12-07 MED ORDER — METOPROLOL TARTRATE 25 MG PO TABS: 25.0000 mg | ORAL_TABLET | Freq: Two times a day (BID) | ORAL | 0 refills | Status: AC

## 2017-12-07 NOTE — Progress Notes (Signed)
Chart and office note reviewed in detail  > agree with a/p as outlined    

## 2017-12-07 NOTE — Patient Instructions (Signed)
**   Need to establish primary care physician, needs apt in next 2-4 weeks (if unable to get an apt please return to see [place] here)  Refilled metoprolol and Trelegy (sample given)  Please ace wrap both legs (from toe to knee), firm but not cutting off circulation Elevate legs as much as possible  Does not appear infected, no open areas or wheeping

## 2017-12-07 NOTE — Assessment & Plan Note (Addendum)
-   Stable; continues Trelegy (sample given and refill sent) - No recent flares of symptoms  - Pulmonary fu as needed

## 2017-12-07 NOTE — Assessment & Plan Note (Signed)
-   BLE with +2-3 edema (not new). Hx cellulitis  - Mod erythema. No warmth, open areas or weeping. Abx not indicated. (if swelling continues with ace wrap would check labs and consider lasix) - Encourage strict leg elevation and compression.  - Needs to see primary care, if unable to get an apt in 2-4 weeks please return for further care/management

## 2017-12-07 NOTE — Assessment & Plan Note (Signed)
-   Hx elevated blood pressure, 154/101 in 2017 - Taking metoprolol 25mg  BID - Will send RX with no refills. Patient needs to fu with primary care fr management

## 2017-12-07 NOTE — Progress Notes (Signed)
@Patient  ID: Omar Wagner, male    DOB: 02-Apr-1956, 62 y.o.   MRN: [id number]  Chief Complaint  Patient presents with  . Follow-up     last OV 11/2016 w/ MW, needs refills trelegy,metoprolol, breathing good as long as on trelegy    Referring provider: Marliss Coots, NP  HPI: 62 year old male, former smoker (quit 2017). Patient of Dr. Melvyn Novas, last seen 12/03/16. Patient previously had freq bronchitis and dyspnea prior to quitting smoking. He experienced sob x 3-4 years with occ wheeze and cough. He was placed on bevespi which was later changed to Trelegy per the community wellness. Pulmonary as needed.   12/07/2017 Presents today for follow-up visit, patient needs med refills. Breathing is fine as long has he continues using Trelegy. Currently living in an apartment by himself. Acute complaints of bilateral lower extremities swelling, states that they have been like this for the past year. No fever, wheeping or open areas. He reports past care with [facility], has not been seen recently. Overdue for colonoscopy and regular health maintenance. Occ alcohol use, 1-2 beers. Denies tobacco use or other substances.    No Known Allergies  Immunization History  Administered Date(s) Administered  . Pneumococcal Polysaccharide-23 01/12/2013    Past Medical History:  Diagnosis Date  . Basal cell carcinoma   . COPD (chronic obstructive pulmonary disease) ([place])   . Depression     Tobacco History: Social History   Tobacco Use  Smoking Status Former Smoker  . Packs/day: 1.00  . Years: 30.00  . Pack years: 30.00  . Types: Cigarettes  . Last attempt to quit: 02/15/2016  . Years since quitting: 1.8  Smokeless Tobacco Never Used   Counseling given: Not Answered   Outpatient Medications Prior to Visit  Medication Sig Dispense Refill  . albuterol (ACCUNEB) 0.63 MG/3ML nebulizer solution 1 vial in nebulizer every 6 hours as needed    . albuterol (PROVENTIL HFA;VENTOLIN HFA) 108 (90 Base)  MCG/ACT inhaler Inhale 2 puffs into the lungs every 4 (four) hours as needed for wheezing or shortness of breath. 1 Inhaler 3  . Ascorbic Acid (VITAMIN C) 1000 MG tablet Take 1,000 mg by mouth daily.     . Cyanocobalamin (VITAMIN B-12 PO) Take by mouth daily.    . Fluticasone-Umeclidin-Vilant (TRELEGY ELLIPTA) 100-62.5-25 MCG/INH AEPB Inhale 1 puff into the lungs daily.    . Multiple Vitamin (MULITIVITAMIN WITH MINERALS) TABS Take 1 tablet by mouth daily.    . metoprolol tartrate (LOPRESSOR) 25 MG tablet Take 1 tablet (25 mg total) by mouth 2 (two) times daily. 60 tablet 0  . montelukast (SINGULAIR) 10 MG tablet Take 10 mg by mouth at bedtime.    . Potassium (POTASSIMIN PO) Take 1 tablet by mouth daily as needed.     No facility-administered medications prior to visit.       Review of Systems  Review of Systems  Constitutional: Negative.   HENT: Negative.   Respiratory: Negative for cough, shortness of breath and wheezing.   Cardiovascular: Positive for leg swelling.  Neurological: Negative.      Physical Exam  BP 126/68 (BP Location: Left Arm, Cuff Size: Normal)   Pulse 83   Ht 6' (1.829 m)   Wt 189 lb 3.2 oz (85.8 kg)   SpO2 92%   BMI 25.66 kg/m  Physical Exam  Constitutional: He is oriented to person, place, and time. He appears well-nourished.  Slight disheveled appearance, no acute distress.   HENT:  Head: Normocephalic and atraumatic.  Eyes: Pupils are equal, round, and reactive to light. EOM are normal.  Neck: Normal range of motion. Neck supple.  Cardiovascular: Normal rate.  Pulmonary/Chest: Effort normal. He has wheezes.  Musculoskeletal: Normal range of motion.  Neurological: He is alert and oriented to person, place, and time.  Skin: Skin is warm and dry.  Psychiatric: He has a normal mood and affect. His behavior is normal.  Disorganized thinking      Lab Results:  CBC    Component Value Date/Time   WBC 8.6 12/16/2014 2036   RBC 4.55 12/16/2014  2036   HGB 15.2 12/16/2014 2036   HCT 45.4 12/16/2014 2036   PLT 204 12/16/2014 2036   MCV 99.8 12/16/2014 2036   MCH 33.4 12/16/2014 2036   MCHC 33.5 12/16/2014 2036   RDW 12.3 12/16/2014 2036   LYMPHSABS 1.8 12/16/2014 2036   MONOABS 0.5 12/16/2014 2036   EOSABS 0.1 12/16/2014 2036   BASOSABS 0.0 12/16/2014 2036    BMET    Component Value Date/Time   NA 142 12/16/2014 2036   K 3.7 12/16/2014 2036   CL 109 12/16/2014 2036   CO2 23 12/16/2014 2036   GLUCOSE 91 12/16/2014 2036   BUN 10 12/16/2014 2036   CREATININE 0.88 12/16/2014 2036   CALCIUM 9.3 12/16/2014 2036   GFRNONAA >60 12/16/2014 2036   GFRAA >60 12/16/2014 2036    BNP No results found for: BNP  ProBNP No results found for: PROBNP  Imaging: No results found.   Assessment & Plan:   Bilateral lower extremity edema - BLE with +2-3 edema (not new). Hx cellulitis  - Mod erythema. No warmth, open areas or weeping. Abx not indicated. (if swelling continues with ace wrap would check labs and consider lasix) - Encourage strict leg elevation and compression.  - Needs to see primary care, if unable to get an apt in 2-4 weeks please return for further care/management   COPD GOLD III  - Stable; continues Trelegy (sample given and refill sent) - No recent flares of symptoms  - Pulmonary fu as needed   History of elevated blood pressure while in hospital - Hx elevated blood pressure, 154/101 in 2017 - Taking metoprolol 25mg  BID - Will send RX with no refills. Patient needs to fu with primary care fr management     Martyn Ehrich, NP 12/07/2017

## 2017-12-26 ENCOUNTER — Telehealth: Payer: Self-pay | Admitting: Internal Medicine

## 2017-12-26 MED ORDER — FLUTICASONE-UMECLIDIN-VILANT 100-62.5-25 MCG/INH IN AEPB: 1.0000 | INHALATION_SPRAY | Freq: Every day | RESPIRATORY_TRACT | 5 refills | Status: DC

## 2017-12-26 NOTE — Telephone Encounter (Signed)
Spoke with patient. He stated that he needed a refill on his Trelegy to be called into [facility] on [address]. Advised patient that I would call in medication for him. He verbalized understanding. Nothing further needed at time of call.

## 2017-12-29 ENCOUNTER — Telehealth: Payer: Self-pay | Admitting: Internal Medicine

## 2017-12-29 MED ORDER — FLUTICASONE-UMECLIDIN-VILANT 100-62.5-25 MCG/INH IN AEPB: 1.0000 | INHALATION_SPRAY | Freq: Every day | RESPIRATORY_TRACT | 0 refills | Status: DC

## 2017-12-29 NOTE — Telephone Encounter (Signed)
Spoke with the pt  I advised he needs to get drug formulary before next ov to discuss  We can try for pt assistance for trelegy  I left him a sample of this as well as [place] forms  Pt states nothing further needed

## 2018-02-13 ENCOUNTER — Telehealth: Payer: Self-pay | Admitting: Internal Medicine

## 2018-02-13 MED ORDER — FLUTICASONE-UMECLIDIN-VILANT 100-62.5-25 MCG/INH IN AEPB: 1.0000 | INHALATION_SPRAY | Freq: Every day | RESPIRATORY_TRACT | 5 refills | Status: DC

## 2018-02-13 NOTE — Telephone Encounter (Signed)
Spoke with pt. He is requesting a prescription for Trelegy. At his last OV, he was given a sample and this has been working well for him. Rx has been sent in. Nothing further was needed.

## 2018-02-14 ENCOUNTER — Telehealth: Payer: Self-pay | Admitting: Internal Medicine

## 2018-02-14 NOTE — Telephone Encounter (Signed)
Spoke with pt in the lobby. He is needing a [employer] patient assistance form for Trelegy. This has been provided to the pt. Nothing further was needed.

## 2018-02-17 ENCOUNTER — Telehealth: Payer: Self-pay | Admitting: Internal Medicine

## 2018-02-17 ENCOUNTER — Telehealth: Payer: Self-pay

## 2018-02-17 NOTE — Telephone Encounter (Signed)
Paper work ([place] patient assistance) obtained and faxed to [employer]. Will place in MW cubby to hold until we hear back from [place].

## 2018-02-20 NOTE — Telephone Encounter (Signed)
Nothing further is needed at this time.

## 2018-03-02 ENCOUNTER — Telehealth: Payer: Self-pay

## 2018-03-02 NOTE — Telephone Encounter (Signed)
Called and spoke to [place] rep to get an update regarding his [place] patient assistance for Trelegy, per rep we are not listed as an advocate on patient application so they are not allowed to give [place] any information. It was explained that we are the prescribing doctor's office, and rep still refused to give any update. She stated any information that was needed they would contact the patient. Left message with patient to get an update. Nothing further is needed at this time.

## 2018-05-02 ENCOUNTER — Telehealth: Payer: Self-pay | Admitting: Internal Medicine

## 2018-05-02 NOTE — Telephone Encounter (Signed)
Called pt and advised message from the provider. Pt understood and verbalized understanding. Nothing further is needed.   Pt made an appt for Monday. He declined Prednisone at this timer and stated he would have enough of his inhaler until Monday. Nothing further is needed.

## 2018-05-02 NOTE — Telephone Encounter (Signed)
Give sample of anoro and ov with formulary in 2 weeks   If breathing worse in interim ok to have on hand  Prednisone 10 mg take  4 each am x 2 days,   2 each am x 2 days,  1 each am x 2 days and stop

## 2018-05-02 NOTE — Telephone Encounter (Signed)
Unfortunately we do not have any Trelegy samples in the office Went to speak with patient in the lobby  Patient asked for update on his patient assistance from October 2019 for Trelegy Advised pt will check Called [employer] @ [phone] and spoke with rep Rachel Bo who needed to speak with patient in order to verify his account Unfortunately patient does NOT qualify for patient assistance because of his Medicaid insurance  Patient is asking if MW can prescribe another medication in the place of Trelegy.  [name] out of pocket cost is >$600  Dr Melvyn Novas please advise, thank you.

## 2018-05-08 ENCOUNTER — Ambulatory Visit (INDEPENDENT_AMBULATORY_CARE_PROVIDER_SITE_OTHER): Payer: Medicaid Other | Admitting: Internal Medicine

## 2018-05-08 ENCOUNTER — Encounter: Payer: Self-pay | Admitting: Internal Medicine

## 2018-05-08 DIAGNOSIS — J449 Chronic obstructive pulmonary disease, unspecified: Secondary | ICD-10-CM

## 2018-05-08 DIAGNOSIS — F17210 Nicotine dependence, cigarettes, uncomplicated: Secondary | ICD-10-CM | POA: Diagnosis not present

## 2018-05-08 MED ORDER — GLYCOPYRROLATE-FORMOTEROL 9-4.8 MCG/ACT IN AERO: 2.0000 | INHALATION_SPRAY | Freq: Two times a day (BID) | RESPIRATORY_TRACT | 11 refills | Status: DC

## 2018-05-08 MED ORDER — GLYCOPYRROLATE-FORMOTEROL 9-4.8 MCG/ACT IN AERO: 2.0000 | INHALATION_SPRAY | Freq: Two times a day (BID) | RESPIRATORY_TRACT | 0 refills | Status: DC

## 2018-05-08 NOTE — Patient Instructions (Signed)
Plan A = Automatic = Bevespi Take 2 puffs first thing in am and then another 2 puffs about 12 hours later.   Work on inhaler technique:  relax and gently blow all the way out then take a nice smooth deep breath back in, triggering the inhaler at same time you start breathing in.  Hold for up to 5 seconds if you can. Blow out thru nose. Rinse and gargle with water when done      Plan B = Backup Only use your albuterol (ventolin)  inhaler as a rescue medication to be used if you can't catch your breath by resting or doing a relaxed purse lip breathing pattern.  - The less you use it, the better it will work when you need it. - Ok to use the inhaler up to 2 puffs  every 4 hours if you must but call for appointment if use goes up over your usual need - Don't leave home without it !!  (think of it like the spare tire for your car)   Plan C = Crisis - only use your albuterol nebulizer if you first try Plan B and it fails to help > ok to use the nebulizer up to every 4 hours but if start needing it regularly call for immediate appointment    Please schedule a follow up office visit in 4 weeks, sooner if needed  with all medications /inhalers/ solutions in hand so we can verify exactly what you are taking. This includes all medications from all doctors and over the counters  And your formulary list for alternatives

## 2018-05-08 NOTE — Progress Notes (Signed)
Subjective:     Patient ID: Omar Wagner, male   DOB: 04-12-1956,    MRN: [id number]     Brief patient profile:  41 yowm quit smoking 02/2016 with onset of doe x 2010 with h/o freq bronchitis episodes prior to quit smoking which is some better but the breathing is not despite advair disc x 2016 and neb daily and saba hfa much less but referred to pulmonary clinic 08/02/2016 by EDP at [facility] p flare 02/23/16     History of Present Illness  .08/02/2016 1st [facility] office visit/ Wert  maint advair 250 /daily neb  Chief Complaint  Patient presents with  . Pulmonary Consult    Self referral for COPD. Pt wants to apply for disability for his COPD.  He c/o SOB for the past 3-4 years. He states breathing bothers him when walking up any sort of incline. He has occ cough and wheezing. He is using albuterol neb and albuterol inhaler once daily on average.   doe = MMRC1 = can walk nl pace, flat grade, can't hurry or go uphills or steps s sob  Also carrying lumber > 5 lb anywhere Onset was insidious x 10 y prior to [place]  And progressive with only one flare oin 02/2016 for which rx in ER (see notes) and none since quit smoking  rec Plan A = Automatic = Stop advair and start Bevespi Take 2 puffs first thing in am and then another 2 puffs about 12 hours later Plan B = Backup Only use your albuterol (Ventolin) as a rescue medication  Plan C = Crisis - only use your albuterol nebulizer if you first try Plan B and it fails to help > ok to use the nebulizer up to every 4 hours but if start needing it regularly call for immediate appointment     09/03/2016  f/u ov/Wert re:   GOLD III copd/ sp smoking cessation [id number]  Chief Complaint  Patient presents with  . Follow-up    Breathing is some better. No new co's today. He is using ventolin HFA 4 x per wk on average and has not needed to use.   copd  III on trelegy liked  bevespi better  Doe still = MMRC1  rec Plan A = Automatic = Stop advair and start  Bevespi Take 2 puffs first thing in am and then another 2 puffs about 12 hours later Plan B = Backup Only use your albuterol (Ventolin) as a rescue medication Plan C = Crisis - only use your albuterol nebulizer if you first try Plan B     12/03/2016  f/u ov/Wert re:  GOLD III/ still smoking on trelegy plus prn saba up to 3 x daily hfa only ,no neb  Chief Complaint  Patient presents with  . Follow-up    Breathing is overall doing well. He has noticed occ palpitations since started on Trelegy by his PCP approx 2 months ago. He uses his albuterol inhaler 3 x per wk on average and he has not needed neb.    doe still = MMRC1 = can walk nl pace, flat grade, can't hurry or go uphills or steps s sob   Some hoarseness from trelegy he didn't have on bevespi plus palp noted, better on lopressor tid rec Plan A = Automatic =  Trelegy one click each am then rinse and gargle Plan B = Backup Only use your albuterol (Ventolin) as a rescue medication Plan C = Crisis - only use  your albuterol nebulizer if you first try Plan B     05/08/2018  f/u ov/Wert re:  GOLD III still smoking - maint rx with trelegy Laurine Blazer - trelegy too expensive under his plan but did not provide list of alternatives  Chief Complaint  Patient presents with  . Follow-up    Breathing is doing well. He is using his albuterol inhaler 2-3 x per wk.   Dyspnea:  MMRC1 = can walk nl pace, flat grade, can't hurry or go uphills or steps s sob   Cough: rattling in am worse Sleeping: sits up due sob SABA use: a few times a week 02: none     No obvious day to day or daytime variability or assoc excess/ purulent sputum or mucus plugs or hemoptysis or cp or chest tightness, subjective wheeze or overt sinus or hb symptoms.     Also denies any obvious fluctuation of symptoms with weather or environmental changes or other aggravating or alleviating factors except as outlined above   No unusual exposure hx or h/o childhood pna/ asthma or  knowledge of premature birth.  Current Allergies, Complete Past Medical History, Past Surgical History, Family History, and Social History were reviewed in [employer] record.  ROS  The following are not active complaints unless bolded Hoarseness, sore throat, dysphagia, dental problems, itching, sneezing,  nasal congestion or discharge of excess mucus or purulent secretions, ear ache,   fever, chills, sweats, unintended wt loss or wt gain, classically pleuritic or exertional cp,  orthopnea pnd or arm/hand swelling  or leg swelling, presyncope, palpitations, abdominal pain, anorexia, nausea, vomiting, diarrhea  or change in bowel habits or change in bladder habits, change in stools or change in urine, dysuria, hematuria,  rash, arthralgias, visual complaints, headache, numbness, weakness or ataxia or problems with walking or coordination,  change in mood or  memory.        Current Meds - also on trelegy/ - NOTE:   Unable to verify as accurately reflecting what pt takes     Medication Sig  . albuterol (ACCUNEB) 0.63 MG/3ML nebulizer solution 1 vial in nebulizer every 6 hours as needed  . albuterol (PROVENTIL HFA;VENTOLIN HFA) 108 (90 Base) MCG/ACT inhaler Inhale 2 puffs into the lungs every 4 (four) hours as needed for wheezing or shortness of breath.  . Cyanocobalamin (VITAMIN B-12 PO) Take by mouth daily.  . Multiple Vitamin (MULITIVITAMIN WITH MINERALS) TABS Take 1 tablet by mouth daily.                     Objective:   Physical Exam  Amb hoarse wm nad     05/08/2018      211 12/03/2016        184  09/03/2016        193  08/02/16 191 lb (86.6 kg)  07/08/16 175 lb (79.4 kg)  02/23/16 170 lb (77.1 kg)    Vital signs reviewed - Note on arrival 02 sats  91% on RA     HEENT:   Poor dentition / nl  oropharynx. Nl external ear canals without cough reflex -  Mild bilateral non-specific turbinate edema     NECK :  without JVD/Nodes/TM/ nl carotid upstrokes  bilaterally   LUNGS: no acc muscle use,  Mod barrel  contour chest wall with bilateral  Insp/ exp coarse rhonchi   without cough on insp or exp maneuver and mod  Hyperresonant  to  percussion bilaterally  CV:  RRR  no s3 or murmur or increase in P2, and no edema   ABD:  soft and nontender with pos mid  insp Hoover's  in the supine position. No bruits or organomegaly appreciated, bowel sounds nl  MS:   Nl gait/  ext warm without deformities, calf tenderness, cyanosis or clubbing No obvious joint restrictions   SKIN: warm and dry without lesions    NEURO:  alert, approp, nl sensorium with  no motor or cerebellar deficits apparent.             Assessment:

## 2018-05-10 ENCOUNTER — Encounter: Payer: Self-pay | Admitting: Internal Medicine

## 2018-05-10 DIAGNOSIS — F17210 Nicotine dependence, cigarettes, uncomplicated: Secondary | ICD-10-CM | POA: Insufficient documentation

## 2018-05-10 NOTE — Assessment & Plan Note (Signed)
-   Spirometry 08/02/2016  FEV1 1.25 (33%)  Ratio 45  p advair am of study  - 08/02/2016    try bevespi x 4 week samples  - 09/03/2016  After extensive coaching HFA effectiveness =   90% > continue bevespi - 12/03/2016 changed to trelegy by [facility] > pulmonary f/u prn   - 05/08/2018  After extensive coaching inhaler device,  effectiveness =    75% (short Ti) try bevespi 2 bid x 4 week samples and return with formulary in 4 weeks   Pt is Group B in terms of symptom/risk and laba/lama therefore appropriate rx at this point and running into cost of trelegy which he probably doesn't need anyway asn is this laba/labma/ics and we have plenty of samples of Bevespi to try for now  Formulary restrictions will be an ongoing challenge for the forseable future and I would be happy to pick an alternative if the pt will first  provide me a list of them but pt  will need to return here for training for any new device that is required eg dpi vs hfa vs respimat.    In meantime we can always provide samples so the patient never runs out of any needed respiratory medications.

## 2018-05-10 NOTE — Assessment & Plan Note (Addendum)
4-5 min discussion re active cigarette smoking in addition to office E&M  Ask about tobacco use:   ongoing Advise quitting   I emphasized that although we never turn away smokers from the pulmonary clinic, we do ask that they understand that the recommendations that we make  won't work nearly as well in the presence of continued cigarette exposure. In fact, we may very well  reach a point where we can't promise to help the patient if he/she can't quit smoking. (We can and will promise to try to help, we just can't promise what we recommend will really work)  Assess willingness:  Not committed at this point Assist in quit attempt:  Per PCP when ready Arrange follow up:   Follow up per Primary Care planned  For smoking cessation classes call [phone]        I had an extended discussion with the patient reviewing all relevant studies completed to date and  lasting 15 to 20 minutes of a 25 minute visit    See device teaching which extended face to face time for this visit.  Each maintenance medication was reviewed in detail including emphasizing most importantly the difference between maintenance and prns and under what circumstances the prns are to be triggered using an action plan format that is not reflected in the computer generated alphabetically organized AVS which I have not found useful in most complex patients, especially with respiratory illnesses  Please see AVS for specific instructions unique to this visit that I personally wrote and verbalized to the the pt in detail and then reviewed with pt  by my nurse highlighting any  changes in therapy recommended at today's visit to their plan of care.

## 2018-08-02 ENCOUNTER — Ambulatory Visit: Payer: Self-pay | Admitting: Cardiology

## 2018-08-21 ENCOUNTER — Ambulatory Visit: Payer: Self-pay | Admitting: Cardiology

## 2018-08-21 NOTE — Progress Notes (Deleted)
Patient referred by Marliss Coots, NP for ***  Subjective:   Omar Wagner, male    DOB: [birth year], 63 y.o.   MRN: [id number]   No chief complaint on file.   *** HPI  63 y.o. Caucasian male with hypertension, tobacco abuse, COPD, referred for evaluation of palpitations.  *** Past Medical History:  Diagnosis Date  . Basal cell carcinoma   . COPD (chronic obstructive pulmonary disease) ([place])   . Depression     *** Past Surgical History:  Procedure Laterality Date  . HERNIA REPAIR      *** Social History   Socioeconomic History  . Marital status: Widowed    Spouse name: Not on file  . Number of children: Not on file  . Years of education: Not on file  . Highest education level: Not on file  Occupational History  . Not on file  Social Needs  . Financial resource strain: Not on file  . Food insecurity:    Worry: Not on file    Inability: Not on file  . Transportation needs:    Medical: Not on file    Non-medical: Not on file  Tobacco Use  . Smoking status: Current Every Day Smoker    Packs/day: 1.00    Years: 30.00    Pack years: 30.00    Types: Cigarettes  . Smokeless tobacco: Never Used  Substance and Sexual Activity  . Alcohol use: Yes    Comment: social  . 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 Sexual activity: Never    Comment: opiate abuse  Lifestyle  . Physical activity:    Days per week: Not on file    Minutes per session: Not on file  . Stress: Not on file  Relationships  . Social connections:    Talks on phone: Not on file    Gets together: Not on file    Attends religious service: Not on file    Active member of club or organization: Not on file    Attends meetings of clubs or organizations: Not on file    Relationship status: Not on file  . Intimate partner violence:    Fear of current or ex partner: Not on file    Emotionally abused: Not on file    Physically abused: Not on file    Forced  sexual activity: Not on file  Other Topics Concern  . Not on file  Social History Narrative  . Not on file    *** Current Outpatient Medications on File Prior to Visit  Medication Sig Dispense Refill  . albuterol (ACCUNEB) 0.63 MG/3ML nebulizer solution 1 vial in nebulizer every 6 hours as needed    . albuterol (PROVENTIL HFA;VENTOLIN HFA) 108 (90 Base) MCG/ACT inhaler Inhale 2 puffs into the lungs every 4 (four) hours as needed for wheezing or shortness of breath. 1 Inhaler 3  . Cyanocobalamin (VITAMIN B-12 PO) Take by mouth daily.    . Glycopyrrolate-Formoterol (BEVESPI AEROSPHERE) 9-4.8 MCG/ACT AERO Inhale 2 puffs into the lungs 2 (two) times daily. 1 Inhaler 11  . metoprolol tartrate (LOPRESSOR) 25 MG tablet Take 1 tablet (25 mg total) by mouth 2 (two) times daily. (Patient not taking: Reported on 05/08/2018) 60 tablet 0  . montelukast (SINGULAIR) 10 MG tablet Take 10 mg by mouth at bedtime.    . Multiple Vitamin (MULITIVITAMIN WITH MINERALS) TABS Take 1 tablet by mouth daily.  No current facility-administered medications on file prior to visit.     Cardiovascular studies:  ***  Echocardiogram 2016: - Left ventricle: The cavity size was normal. Wall thickness was normal. Systolic function was normal. The estimated ejection fraction was in the range of 55% to 60%. Wall motion was normal; there were no regional wall motion abnormalities. Left ventricular diastolic function parameters were normal. - Right ventricle: The cavity size was mildly dilated. - Right atrium: The atrium was mildly dilated.  Impressions:  - Normal LV function; mild RAE/RVE; trace MR and TR.  Recent labs: 06/15/2018: Glucose 88. BUN/Cr 13/0.93. eGFR 86. Na/K 138/4.6.  AST 84 (0-40), ALT 66 (0-44) H/H 14/43. MCV 106. Platelets 213   Review of Systems  Constitution: Negative for decreased appetite, malaise/fatigue, weight gain and weight loss.  HENT: Negative for congestion.   Eyes:  Negative for visual disturbance.  Cardiovascular: Positive for palpitations. Negative for chest pain, dyspnea on exertion, leg swelling and syncope.  Respiratory: Negative for shortness of breath.   Endocrine: Negative for cold intolerance.  Hematologic/Lymphatic: Does not bruise/bleed easily.  Skin: Negative for itching and rash.  Musculoskeletal: Negative for myalgias.  Gastrointestinal: Negative for abdominal pain, nausea and vomiting.  Genitourinary: Negative for dysuria.  Neurological: Negative for dizziness and weakness.  Psychiatric/Behavioral: The patient is not nervous/anxious.   All other systems reviewed and are negative.       *** There were no vitals filed for this visit.  Objective:   Physical Exam        Assessment & Recommendations:   ***  ***   Thank you for referring the patient to [place]. Please feel free to contact with any questions.  Nigel Mormon, MD [facility] Pager: [phone] Office: [phone] If no answer Cell [phone]

## 2018-10-27 ENCOUNTER — Other Ambulatory Visit: Payer: Self-pay

## 2018-10-27 ENCOUNTER — Emergency Department (HOSPITAL_COMMUNITY)
Admission: EM | Admit: 2018-10-27 | Discharge: 2018-10-27 | Disposition: A | Discharge status: Home / Self Care | Payer: Medicare Other | Attending: Emergency Medicine | Admitting: Emergency Medicine

## 2018-10-27 ENCOUNTER — Encounter (HOSPITAL_COMMUNITY): Payer: Self-pay

## 2018-10-27 ENCOUNTER — Emergency Department (HOSPITAL_COMMUNITY): Payer: Medicare Other

## 2018-10-27 ENCOUNTER — Telehealth: Payer: Self-pay | Admitting: Internal Medicine

## 2018-10-27 DIAGNOSIS — J449 Chronic obstructive pulmonary disease, unspecified: Secondary | ICD-10-CM | POA: Insufficient documentation

## 2018-10-27 DIAGNOSIS — Z85828 Personal history of other malignant neoplasm of skin: Secondary | ICD-10-CM | POA: Insufficient documentation

## 2018-10-27 DIAGNOSIS — E871 Hypo-osmolality and hyponatremia: Secondary | ICD-10-CM | POA: Diagnosis not present

## 2018-10-27 DIAGNOSIS — E875 Hyperkalemia: Secondary | ICD-10-CM | POA: Insufficient documentation

## 2018-10-27 DIAGNOSIS — F17210 Nicotine dependence, cigarettes, uncomplicated: Secondary | ICD-10-CM | POA: Diagnosis not present

## 2018-10-27 DIAGNOSIS — R609 Edema, unspecified: Secondary | ICD-10-CM

## 2018-10-27 DIAGNOSIS — R600 Localized edema: Secondary | ICD-10-CM | POA: Insufficient documentation

## 2018-10-27 LAB — CBC
HCT: 46.6 % (ref 39.0–52.0)
Hemoglobin: 14.4 g/dL (ref 13.0–17.0)
MCH: 33.7 pg (ref 26.0–34.0)
MCHC: 30.9 g/dL (ref 30.0–36.0)
MCV: 109.1 fL — ABNORMAL HIGH (ref 80.0–100.0)
Platelets: 204 10*3/uL (ref 150–400)
RBC: 4.27 MIL/uL (ref 4.22–5.81)
RDW: 14.2 % (ref 11.5–15.5)
WBC: 7.5 10*3/uL (ref 4.0–10.5)
nRBC: 0 % (ref 0.0–0.2)

## 2018-10-27 LAB — COMPREHENSIVE METABOLIC PANEL
ALT: 14 U/L (ref 0–44)
AST: 24 U/L (ref 15–41)
Albumin: 2.9 g/dL — ABNORMAL LOW (ref 3.5–5.0)
Alkaline Phosphatase: 106 U/L (ref 38–126)
Anion gap: 11 (ref 5–15)
BUN: 14 mg/dL (ref 8–23)
CO2: 19 mmol/L — ABNORMAL LOW (ref 22–32)
Calcium: 8.2 mg/dL — ABNORMAL LOW (ref 8.9–10.3)
Chloride: 103 mmol/L (ref 98–111)
Creatinine, Ser: 0.83 mg/dL (ref 0.61–1.24)
GFR calc Af Amer: >60 mL/min (ref >60)
GFR calc non Af Amer: >60 mL/min (ref >60)
Glucose, Bld: 98 mg/dL (ref 70–99)
Potassium: 5.4 mmol/L — ABNORMAL HIGH (ref 3.5–5.1)
Sodium: 133 mmol/L — ABNORMAL LOW (ref 135–145)
Total Bilirubin: 0.4 mg/dL (ref 0.3–1.2)
Total Protein: 7.5 g/dL (ref 6.5–8.1)

## 2018-10-27 LAB — BRAIN NATRIURETIC PEPTIDE: B Natriuretic Peptide: 51.6 pg/mL (ref 0.0–100.0)

## 2018-10-27 MED ORDER — BEVESPI AEROSPHERE 9-4.8 MCG/ACT IN AERO: 2.0000 | INHALATION_SPRAY | Freq: Two times a day (BID) | RESPIRATORY_TRACT | 0 refills | Status: DC

## 2018-10-27 MED ORDER — SODIUM CHLORIDE 0.9 % IV BOLUS: 1000.0000 mL | Freq: Once | INTRAVENOUS | Status: DC

## 2018-10-27 NOTE — Telephone Encounter (Signed)
Patient came to the office with 3 requests:  Patient assistance forms be printed Schedule appt with Dr Melvyn Novas for 6 month follow up (no complaints, just to "touch base") Obtain inhaler sample  Magda Paganini printed off patient assistance forms Appt scheduled with MW for Monday 6.15.2020 @ 4pm >> COVID screening negative Bevespi sample #1 obtained for patient and documented per protocol  Nothing further needed; will sign off

## 2018-10-27 NOTE — ED Notes (Signed)
PO fluids encouraged per MD order. water provided.

## 2018-10-27 NOTE — ED Notes (Signed)
Pt d/c home per MD order. Discharge summary reviewed, pt uninterested. Ambulatory off unit.

## 2018-10-27 NOTE — ED Notes (Signed)
EDP at bedside  

## 2018-10-27 NOTE — Discharge Instructions (Addendum)
You were evaluated in the Emergency Department and after careful evaluation, we did not find any emergent condition requiring admission or further testing in the hospital.  Your symptoms today seem to be due to lymphedema.  Please call one of the the wound care clinics listed below to set up an appointment.    [facility] [address] [place], [place] [address] [phone]   [facility]  [place], [place] [address] [address], [place] [address] [phone]  Please return to the Emergency Department if you experience any worsening of your condition.  We encourage you to follow up with a primary care provider.  Thank you for allowing [place] to be a part of your care.

## 2018-10-27 NOTE — ED Notes (Addendum)
This nurse and MD to pt room to apply bilat leg dressings. Abd pads placed on pt leg, wrapped with kerlex, then wrapped/secured with coban. Pt tolerated well.

## 2018-10-27 NOTE — ED Provider Notes (Signed)
[facility] Emergency Department Provider Note MRN:  [id number]  Arrival date & time: 10/27/18     Chief Complaint   bilateral leg swelling   History of Present Illness   Omar Wagner is a 63 y.o. year-old male with a history of COPD presenting to the ED with chief complaint of leg swelling.  Several months of leg swelling, redness, leg weeping.  Patient states that he was diagnosed with lymphedema, was referred to a [facility] specialty care center, but never received the phone call.  Here because he does not know what else to do, the legs are still bothering him quite a bit.  No significant pain, no fever, no chest pain.  Patient has chronic cough and shortness of breath that is at baseline related to his COPD.  Denies abdominal pain.  Explains that he cannot wear pants because he saturates his pants with fluid leaking from his legs.  Review of Systems  A complete 10 system review of systems was obtained and all systems are negative except as noted in the HPI and PMH.   [name] Health History    Past Medical History:  Diagnosis Date  . Basal cell carcinoma   . COPD (chronic obstructive pulmonary disease) ([place])   . Depression     Past Surgical History:  Procedure Laterality Date  . HERNIA REPAIR      Family History  Problem Relation Age of Onset  . Cancer Other   . Breast cancer Mother   . Colon cancer Father   . Breast cancer Sister   . Liver cancer Brother     Social History   Socioeconomic History  . Marital status: Widowed    Spouse name: Not on file  . Number of children: Not on file  . Years of education: Not on file  . Highest education level: Not on file  Occupational History  . Not on file  Social Needs  . Financial resource strain: Not on file  . Food insecurity    Worry: Not on file    Inability: Not on file  . Transportation needs    Medical: Not on file    Non-medical: Not on file  Tobacco Use  . Smoking status: Current  Every Day Smoker    Packs/day: 1.00    Years: 30.00    Pack years: 30.00    Types: Cigarettes  . Smokeless tobacco: Never Used  Substance and Sexual Activity  . Alcohol use: Yes    Comment: social  . 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 Sexual activity: Never    Comment: opiate abuse  Lifestyle  . Physical activity    Days per week: Not on file    Minutes per session: Not on file  . Stress: Not on file  Relationships  . Social Herbalist on phone: Not on file    Gets together: Not on file    Attends religious service: Not on file    Active member of club or organization: Not on file    Attends meetings of clubs or organizations: Not on file    Relationship status: Not on file  . Intimate partner violence    Fear of current or ex partner: Not on file    Emotionally abused: Not on file    Physically abused: Not on file    Forced sexual activity: Not on file  Other  Topics Concern  . Not on file  Social History Narrative  . Not on file     Physical Exam  Vital Signs and Nursing Notes reviewed Vitals:   10/27/18 0745 10/27/18 0900  BP:  (!) 122/92  Pulse: (!) 117 (!) 131  Resp: (!) 25 15  Temp:    SpO2: 99% 99%    CONSTITUTIONAL: Chronically ill-appearing, NAD NEURO:  Alert and oriented x 3, no focal deficits EYES:  eyes equal and reactive ENT/NECK:  no LAD, no JVD CARDIO: Tachycardic rate, well-perfused, normal S1 and S2 PULM:  CTAB no wheezing or rhonchi GI/GU:  normal bowel sounds, non-distended, non-tender MSK/SPINE:  No gross deformities SKIN: Bilateral lower extremity edema, erythema, weeping skin PSYCH:  Appropriate speech and behavior  Diagnostic and Interventional Summary    EKG Interpretation  Date/Time:  Friday October 27 2018 08:09:10 EDT Ventricular Rate:  110 PR Interval:    QRS Duration: 98 QT Interval:  338 QTC Calculation: 409 R Axis:   70 Text Interpretation:  Sinus tachycardia Right atrial  enlargement Baseline wander in lead(s) V6 Confirmed by Gerlene Fee [phone]) on 10/27/2018 10:20:14 AM      Labs Reviewed  COMPREHENSIVE METABOLIC PANEL - Abnormal; Notable for the following components:      Result Value   Sodium 133 (*)    Potassium 5.4 (*)    CO2 19 (*)    Calcium 8.2 (*)    Albumin 2.9 (*)    All other components within normal limits  CBC - Abnormal; Notable for the following components:   MCV 109.1 (*)    All other components within normal limits  BRAIN NATRIURETIC PEPTIDE    DG Chest 2 View  Final Result      Medications  sodium chloride 0.9 % bolus 1,000 mL (has no administration in time range)     Procedures Critical Care  ED Course and Medical Decision Making  I have reviewed the triage vital signs and the nursing notes.  Pertinent labs & imaging results that were available during my care of the patient were reviewed by me and considered in my medical decision making (see below for details).  Chronic lymphedema with weeping skin of the bilateral lower extremities in a 63 year old male with history of COPD.  Denies chest pain or shortness of breath, no other symptoms.  Legs appear chronically swollen without evidence signs of infection, though at his current state he is at high risk for developing cellulitis.  Patient does not use compression or elevation techniques, likely just needs better wound care.  Will obtain screening labs and chest x-ray to exclude cardiac, hepatic, renal etiology of swelling, will attempt to coordinate a wound care follow-up.  Labs reveal mild hyponatremia and hyperkalemia.  Patient explains that he has been taking potassium supplemental tablets to help with cramps.  Patient had mild tachycardia during his ED stay, we encouraged p.o. hydration and there was improvement down to 109 on my last exam.  Patient not interested in IV fluids here in the emergency department, wishing to go home.  Strongly advised continued p.o. hydration at  home and stopping the potassium pills.  Patient also advised to return to the emergency department in 2 or 3 days for reassessment of blood work.  Provided with follow-up information for wound care.  No leukocytosis, no fever, nothing to suggest infection today.  After the discussed management above, the patient was determined to be safe for discharge.  The patient was in agreement with  this plan and all questions regarding their care were answered.  ED return precautions were discussed and the patient will return to the ED with any significant worsening of condition.  Barth Kirks. Sedonia Small, MD [place] [email or web address]  Final Clinical Impressions(s) / ED Diagnoses     ICD-10-CM   1. Edema  R60.9 DG Chest 2 View    DG Chest 2 View    ED Discharge Orders    None         Maudie Flakes, MD 10/27/18 1022

## 2018-10-27 NOTE — ED Triage Notes (Signed)
Pt complains of bilateral leg swelling, they are red and weepy Pt states that he was seen at [facility] and sent to [facility] but he thinks the COVID messed up his referral Pt states they look better than 4 months ago but still doesn't have a diagnosis

## 2018-10-27 NOTE — Consult Note (Addendum)
[place] nurse paged, requested outpatient referral for wound care  Patient can be seen in wound care center of his choice.  Clinical staff in ED can call to make appointment. [place] nurse inpatient not affiliated with outpatient services.   [place] [address] [place], [place] [id number] [phone]  [facility]  [place], [place] [address] [place] [place],  [address] [address] [place], [place], [place]

## 2018-10-27 NOTE — ED Notes (Signed)
Patient transported to CT 

## 2018-10-27 NOTE — ED Notes (Signed)
Pt becoming agitated, requesting to be discharged, this nurse notified EDP- EDP at bedside,

## 2018-10-30 ENCOUNTER — Ambulatory Visit (INDEPENDENT_AMBULATORY_CARE_PROVIDER_SITE_OTHER): Payer: Medicare Other | Admitting: Internal Medicine

## 2018-10-30 ENCOUNTER — Other Ambulatory Visit: Payer: Self-pay

## 2018-10-30 ENCOUNTER — Encounter: Payer: Self-pay | Admitting: Internal Medicine

## 2018-10-30 DIAGNOSIS — J449 Chronic obstructive pulmonary disease, unspecified: Secondary | ICD-10-CM

## 2018-10-30 DIAGNOSIS — F17210 Nicotine dependence, cigarettes, uncomplicated: Secondary | ICD-10-CM | POA: Diagnosis not present

## 2018-10-30 DIAGNOSIS — R911 Solitary pulmonary nodule: Secondary | ICD-10-CM | POA: Diagnosis not present

## 2018-10-30 MED ORDER — TRELEGY ELLIPTA 100-62.5-25 MCG/INH IN AEPB: 1.0000 | INHALATION_SPRAY | Freq: Every day | RESPIRATORY_TRACT | 0 refills | Status: DC

## 2018-10-30 MED ORDER — TRELEGY ELLIPTA 100-62.5-25 MCG/INH IN AEPB: 1.0000 | INHALATION_SPRAY | Freq: Every day | RESPIRATORY_TRACT | 11 refills | Status: DC

## 2018-10-30 NOTE — Patient Instructions (Addendum)
Plan A = Automatic = Trelegy one click each am - take two good drags off of one click    Plan B = Backup Only use your albuterol inhaler as a rescue medication to be used if you can't catch your breath by resting or doing a relaxed purse lip breathing pattern.  - The less you use it, the better it will work when you need it. - Ok to use the inhaler up to 2 puffs  every 4 hours if you must but call for appointment if use goes up over your usual need - Don't leave home without it !!  (think of it like the spare tire for your car)    Plan C = Crisis - only use your albuterol nebulizer if you first try Plan B and it fails to help > ok to use the nebulizer up to every 4 hours but if start needing it regularly call for immediate appointment   Please schedule a follow up visit in 3 months but call sooner if needed  with all medications /inhalers/ solutions in hand so we can verify exactly what you are taking. This includes all medications from all doctors and over the counters

## 2018-10-30 NOTE — Progress Notes (Signed)
Subjective:     Patient ID: Omar Wagner, male   DOB: 1955-11-28,    MRN: [id number]     Brief patient profile:  64 yowm recurrent smoker with onset of doe x 2010 with h/o freq bronchitis episodes prior to quit smoking which is some better but the breathing is not despite advair disc x 2016 and neb daily and saba hfa much less but referred to pulmonary clinic 08/02/2016 by EDP at [facility] p flare 02/23/16     History of Present Illness  .08/02/2016 1st [facility] office visit/ [facility]  maint advair 250 /daily neb  Chief Complaint  Patient presents with  . Pulmonary Consult    Self referral for COPD. Pt wants to apply for disability for his COPD.  He c/o SOB for the past 3-4 years. He states breathing bothers him when walking up any sort of incline. He has occ cough and wheezing. He is using albuterol neb and albuterol inhaler once daily on average.   doe = MMRC1 = can walk nl pace, flat grade, can't hurry or go uphills or steps s sob  Also carrying lumber > 5 lb anywhere Onset was insidious x 10 y prior to [place]  And progressive with only one flare oin 02/2016 for which rx in ER (see notes) and none since quit smoking  rec Plan A = Automatic = Stop advair and start Bevespi Take 2 puffs first thing in am and then another 2 puffs about 12 hours later Plan B = Backup Only use your albuterol (Ventolin) as a rescue medication  Plan C = Crisis - only use your albuterol nebulizer if you first try Plan B and it fails to help > ok to use the nebulizer up to every 4 hours but if start needing it regularly call for immediate appointment     09/03/2016  f/u ov/Wert re:   GOLD III copd/ sp smoking cessation [id number]  Chief Complaint  Patient presents with  . Follow-up    Breathing is some better. No new co's today. He is using ventolin HFA 4 x per wk on average and has not needed to use.   copd  III on trelegy liked  bevespi better  Doe still = MMRC1  rec Plan A = Automatic = Stop advair and start  Bevespi Take 2 puffs first thing in am and then another 2 puffs about 12 hours later Plan B = Backup Only use your albuterol (Ventolin) as a rescue medication Plan C = Crisis - only use your albuterol nebulizer if you first try Plan B     12/03/2016  f/u ov/Wert re:  GOLD III/ still smoking on trelegy plus prn saba up to 3 x daily hfa only ,no neb  Chief Complaint  Patient presents with  . Follow-up    Breathing is overall doing well. He has noticed occ palpitations since started on Trelegy by his PCP approx 2 months ago. He uses his albuterol inhaler 3 x per wk on average and he has not needed neb.    doe still = MMRC1 = can walk nl pace, flat grade, can't hurry or go uphills or steps s sob   Some hoarseness from trelegy he didn't have on bevespi plus palp noted, better on lopressor tid rec Plan A = Automatic =  Trelegy one click each am then rinse and gargle Plan B = Backup Only use your albuterol (Ventolin) as a rescue medication Plan C = Crisis - only use your  albuterol nebulizer if you first try Plan B     05/08/2018  f/u ov/Wert re:  GOLD III still smoking - maint rx with trelegy Laurine Blazer - trelegy too expensive under his plan but did not provide list of alternatives  Chief Complaint  Patient presents with  . Follow-up    Breathing is doing well. He is using his albuterol inhaler 2-3 x per wk.   Dyspnea:  MMRC1 = can walk nl pace, flat grade, can't hurry or go uphills or steps s sob   Cough: rattling in am worse Sleeping: sits up due sob SABA use: a few times a week 02: none   rec Plan A = Automatic = Bevespi Take 2 puffs first thing in am and then another 2 puffs about 12 hours later.  Work on inhaler technique:   Plan B = Backup Only use your albuterol (ventolin)  inhaler as a rescue medication  Plan C = Crisis - only use your albuterol nebulizer if you first try Plan B Please schedule a follow up office visit in 4 weeks, sooner if needed  with all medications  /inhalers/ solutions in hand so we can verify exactly what you are taking. This includes all medications from all doctors and over the counters  And your formulary list for alternatives       10/30/2018  f/u ov/Wert re: GOLD III/ can't afford maint rx  Chief Complaint  Patient presents with  . Follow-up    Breathing is about the same.  He is using his albuterol inhaler 12 x per wk on average and neb 1-2 x per wk.   Dyspnea:  Riding around neighborhood x half an hour on bike is his only activity but doe still = MMRC2 = can't walk a nl pace on a flat grade s sob but does fine slow and flat  Cough: none  Sleeping: at 45 degrees in recliner  SABA use: as above - way too much when off maint rx / trelegy has worked the best when he can afford it. 02: none    No obvious day to day or daytime variability or assoc excess/ purulent sputum or mucus plugs or hemoptysis or cp or chest tightness, subjective wheeze or overt sinus or hb symptoms.   Sleeping  without nocturnal  or early am exacerbation  of respiratory  c/o's or need for noct saba. Also denies any obvious fluctuation of symptoms with weather or environmental changes or other aggravating or alleviating factors except as outlined above   No unusual exposure hx or h/o childhood pna/ asthma or knowledge of premature birth.  Current Allergies, Complete Past Medical History, Past Surgical History, Family History, and Social History were reviewed in [employer] record.  ROS  The following are not active complaints unless bolded Hoarseness, sore throat, dysphagia, dental problems, itching, sneezing,  nasal congestion or discharge of excess mucus or purulent secretions, ear ache,   fever, chills, sweats, unintended wt loss or wt gain, classically pleuritic or exertional cp,  orthopnea pnd or arm/hand swelling  or leg swelling, presyncope, palpitations, abdominal pain, anorexia, nausea, vomiting, diarrhea  or change in bowel  habits or change in bladder habits, change in stools or change in urine, dysuria, hematuria,  rash, arthralgias, visual complaints, headache, numbness, weakness or ataxia or problems with walking or coordination,  change in mood or  memory.        Current Meds  Medication Sig  . albuterol (ACCUNEB) 0.63 MG/3ML nebulizer solution  Take 1 ampule by nebulization every 6 (six) hours as needed for wheezing or shortness of breath.   Marland Kitchen albuterol (PROVENTIL HFA;VENTOLIN HFA) 108 (90 Base) MCG/ACT inhaler Inhale 2 puffs into the lungs every 4 (four) hours as needed for wheezing or shortness of breath.  . Cyanocobalamin (VITAMIN B-12 PO) Take 1 tablet by mouth daily.   .     . ibuprofen (ADVIL) 200 MG tablet Take 800 mg by mouth every 6 (six) hours as needed for mild pain.  . metoprolol tartrate (LOPRESSOR) 25 MG tablet Take 1 tablet (25 mg total) by mouth 2 (two) times daily.  . Multiple Vitamin (MULITIVITAMIN WITH MINERALS) TABS Take 1 tablet by mouth daily.                           Objective:   Physical Exam  amb wm mildly hoarse  10/30/2018        186  05/08/2018      211 12/03/2016        184  09/03/2016        193  08/02/16 191 lb (86.6 kg)  07/08/16 175 lb (79.4 kg)  02/23/16 170 lb (77.1 kg)    Vital signs reviewed - Note on arrival 02 sats  95% on RA       HEENT:poor  dentition / oropharynx. Nl external ear canals without cough reflex -  Mild bilateral non-specific turbinate edema     NECK :  without JVD/Nodes/TM/ nl carotid upstrokes bilaterally   LUNGS: no acc muscle use,  Mild/mod barrel  contour chest wall with bilateral  Distant bs s audible wheeze and  without cough on insp or exp maneuver and mild/mod  Hyperresonant  to  percussion bilaterally     CV:  RRR  no s3 or murmur or increase in P2, and no edema   ABD:  soft and nontender with pos mid/late insp Hoover's  in the supine position. No bruits or organomegaly appreciated, bowel sounds nl  MS:   Nl gait/   ext warm without deformities, calf tenderness, cyanosis or clubbing No obvious joint restrictions   SKIN: warm and dry without lesions    NEURO:  alert, approp, nl sensorium with  no motor or cerebellar deficits apparent.     I personally reviewed images and agree with radiology impression as follows:  CXR:   10/27/2018  Emphysematous changes in the upper lungs left more than right. Focal density in the left mid upper lung that could represent infiltrate or mass lesion. Follow-up suggested.  No pulmonary edema or definite congestive heart failure.          Assessment:

## 2018-10-31 ENCOUNTER — Telehealth: Payer: Self-pay | Admitting: *Deleted

## 2018-10-31 ENCOUNTER — Encounter: Payer: Self-pay | Admitting: Internal Medicine

## 2018-10-31 DIAGNOSIS — R911 Solitary pulmonary nodule: Secondary | ICD-10-CM | POA: Insufficient documentation

## 2018-10-31 NOTE — Telephone Encounter (Signed)
-----   Message from Tanda Rockers, MD sent at 10/31/2018  5:37 AM EDT ----- Let him know I reviewed his images p ov and find he has a new area of concern in LUL and needs CT s contrast f/u nodule done between now and next ov, no rush.

## 2018-10-31 NOTE — Assessment & Plan Note (Signed)
Incidental finding on cxr 10/27/18 > CT s contrast rec 10/30/2018   Not well seen on lateral view but clearly new since prior study in 2018 and no signs of infection so ct next step   Discussed in detail all the  indications, usual  risks and alternatives  relative to the benefits with patient who agrees to proceed with w/u as outlined.      I had an extended discussion with the patient reviewing all relevant studies completed to date and  lasting 15 to 20 minutes of a 25 minute visit    See device teaching which extended face to face time for this visit.  Each maintenance medication was reviewed in detail including emphasizing most importantly the difference between maintenance and prns and under what circumstances the prns are to be triggered using an action plan format that is not reflected in the computer generated alphabetically organized AVS which I have not found useful in most complex patients, especially with respiratory illnesses  Please see AVS for specific instructions unique to this visit that I personally wrote and verbalized to the the pt in detail and then reviewed with pt  by my nurse highlighting any  changes in therapy recommended at today's visit to their plan of care.

## 2018-10-31 NOTE — Assessment & Plan Note (Signed)
Active smoker  - Spirometry 08/02/2016  FEV1 1.25 (33%)  Ratio 45  p advair am of study  - 08/02/2016    try bevespi x 4 week samples  - 09/03/2016  After extensive coaching HFA effectiveness =   90% > continue bevespi - 12/03/2016 changed to trelegy by communtiy wellness > pulmonary f/u prn  - 05/08/2018  After extensive coaching inhaler device,  effectiveness =    75% (short Ti) try bevespi 2 bid x 4 week samples and return with formulary in 4 weeks  - 10/30/2018  After extensive coaching inhaler device,  effectiveness =    75% hfa/ 90% dpi    Group D in terms of symptom/risk and laba/lama/ICS  therefore appropriate rx at this point >>>  Resume trelegy x 4 weeks samples given, paperwork in progress to see if eligible for free rx from [place]:  formulary restrictions will be an ongoing challenge for the forseable future and I would be happy to pick an alternative if the pt will first  provide me a list of them -  pt  will need to return here for training for any new device that is required eg dpi vs hfa vs respimat.    In the meantime we can always provide samples so that the patient never runs out of any needed respiratory medications.

## 2018-10-31 NOTE — Assessment & Plan Note (Addendum)
4-5 min discussion re active cigarette smoking in addition to office E&M  Ask about tobacco use:   ongoing Advise quitting   Clearly cannot afford to be smoking at this point - advised on the direct and indirect cost of his choice to continue smoking against medical advice  Assess willingness:  Not committed at this point Assist in quit attempt:  Per PCP when ready Arrange follow up:   Follow up per Primary Care planned  For smoking cessation classes call [phone]

## 2018-10-31 NOTE — Telephone Encounter (Signed)
Spoke with the pt and notified of recs per MW  Pt verbalized understanding  Order sent to [facility]

## 2018-11-10 ENCOUNTER — Other Ambulatory Visit: Payer: Self-pay

## 2018-11-10 ENCOUNTER — Encounter (HOSPITAL_BASED_OUTPATIENT_CLINIC_OR_DEPARTMENT_OTHER): Payer: Medicare Other | Attending: Internal Medicine

## 2018-11-10 DIAGNOSIS — L97822 Non-pressure chronic ulcer of other part of left lower leg with fat layer exposed: Secondary | ICD-10-CM | POA: Insufficient documentation

## 2018-11-10 DIAGNOSIS — Z85828 Personal history of other malignant neoplasm of skin: Secondary | ICD-10-CM | POA: Insufficient documentation

## 2018-11-10 DIAGNOSIS — F17200 Nicotine dependence, unspecified, uncomplicated: Secondary | ICD-10-CM | POA: Insufficient documentation

## 2018-11-10 DIAGNOSIS — I890 Lymphedema, not elsewhere classified: Secondary | ICD-10-CM | POA: Diagnosis not present

## 2018-11-10 DIAGNOSIS — J449 Chronic obstructive pulmonary disease, unspecified: Secondary | ICD-10-CM | POA: Insufficient documentation

## 2018-11-10 DIAGNOSIS — I87333 Chronic venous hypertension (idiopathic) with ulcer and inflammation of bilateral lower extremity: Secondary | ICD-10-CM | POA: Diagnosis not present

## 2018-11-10 DIAGNOSIS — L97812 Non-pressure chronic ulcer of other part of right lower leg with fat layer exposed: Secondary | ICD-10-CM | POA: Diagnosis not present

## 2018-11-14 DIAGNOSIS — I87333 Chronic venous hypertension (idiopathic) with ulcer and inflammation of bilateral lower extremity: Secondary | ICD-10-CM | POA: Diagnosis not present

## 2018-11-20 ENCOUNTER — Encounter (HOSPITAL_BASED_OUTPATIENT_CLINIC_OR_DEPARTMENT_OTHER): Payer: Medicare Other | Attending: Internal Medicine

## 2018-11-20 DIAGNOSIS — I87333 Chronic venous hypertension (idiopathic) with ulcer and inflammation of bilateral lower extremity: Secondary | ICD-10-CM | POA: Insufficient documentation

## 2018-11-20 DIAGNOSIS — L97822 Non-pressure chronic ulcer of other part of left lower leg with fat layer exposed: Secondary | ICD-10-CM | POA: Insufficient documentation

## 2018-11-20 DIAGNOSIS — L97812 Non-pressure chronic ulcer of other part of right lower leg with fat layer exposed: Secondary | ICD-10-CM | POA: Diagnosis not present

## 2018-11-20 DIAGNOSIS — I890 Lymphedema, not elsewhere classified: Secondary | ICD-10-CM | POA: Insufficient documentation

## 2018-11-20 DIAGNOSIS — J449 Chronic obstructive pulmonary disease, unspecified: Secondary | ICD-10-CM | POA: Insufficient documentation

## 2018-11-28 DIAGNOSIS — I87333 Chronic venous hypertension (idiopathic) with ulcer and inflammation of bilateral lower extremity: Secondary | ICD-10-CM | POA: Diagnosis not present

## 2018-12-04 DIAGNOSIS — I87333 Chronic venous hypertension (idiopathic) with ulcer and inflammation of bilateral lower extremity: Secondary | ICD-10-CM | POA: Diagnosis not present

## 2018-12-12 DIAGNOSIS — I87333 Chronic venous hypertension (idiopathic) with ulcer and inflammation of bilateral lower extremity: Secondary | ICD-10-CM | POA: Diagnosis not present

## 2018-12-19 ENCOUNTER — Encounter (HOSPITAL_BASED_OUTPATIENT_CLINIC_OR_DEPARTMENT_OTHER): Payer: Medicare Other | Attending: Internal Medicine

## 2018-12-19 DIAGNOSIS — F17200 Nicotine dependence, unspecified, uncomplicated: Secondary | ICD-10-CM | POA: Diagnosis not present

## 2018-12-19 DIAGNOSIS — L97212 Non-pressure chronic ulcer of right calf with fat layer exposed: Secondary | ICD-10-CM | POA: Insufficient documentation

## 2018-12-19 DIAGNOSIS — I87333 Chronic venous hypertension (idiopathic) with ulcer and inflammation of bilateral lower extremity: Secondary | ICD-10-CM | POA: Insufficient documentation

## 2018-12-19 DIAGNOSIS — J449 Chronic obstructive pulmonary disease, unspecified: Secondary | ICD-10-CM | POA: Insufficient documentation

## 2018-12-19 DIAGNOSIS — L97822 Non-pressure chronic ulcer of other part of left lower leg with fat layer exposed: Secondary | ICD-10-CM | POA: Diagnosis present

## 2018-12-26 DIAGNOSIS — I87333 Chronic venous hypertension (idiopathic) with ulcer and inflammation of bilateral lower extremity: Secondary | ICD-10-CM | POA: Diagnosis not present

## 2019-01-03 DIAGNOSIS — I87333 Chronic venous hypertension (idiopathic) with ulcer and inflammation of bilateral lower extremity: Secondary | ICD-10-CM | POA: Diagnosis not present

## 2019-01-08 ENCOUNTER — Other Ambulatory Visit: Payer: Self-pay | Admitting: Internal Medicine

## 2019-01-08 ENCOUNTER — Ambulatory Visit (INDEPENDENT_AMBULATORY_CARE_PROVIDER_SITE_OTHER)
Admission: RE | Admit: 2019-01-08 | Discharge: 2019-01-08 | Disposition: A | Discharge status: Home / Self Care | Payer: Medicare Other | Source: Ambulatory Visit | Attending: Internal Medicine | Admitting: Internal Medicine

## 2019-01-08 ENCOUNTER — Encounter (INDEPENDENT_AMBULATORY_CARE_PROVIDER_SITE_OTHER): Payer: Self-pay

## 2019-01-08 ENCOUNTER — Other Ambulatory Visit: Payer: Self-pay

## 2019-01-08 ENCOUNTER — Telehealth: Payer: Self-pay | Admitting: Internal Medicine

## 2019-01-08 DIAGNOSIS — R911 Solitary pulmonary nodule: Secondary | ICD-10-CM | POA: Diagnosis not present

## 2019-01-08 NOTE — Progress Notes (Signed)
Ordered STAT

## 2019-01-08 NOTE — Telephone Encounter (Signed)
LMTCB

## 2019-01-11 NOTE — Telephone Encounter (Signed)
Called and spoke to pt. Pt is requesting pt assistance for Trelegy. This has been printed out and placed in outgoing mail for pt, address was verified. Pt also states he is unable to keep his PET scan tomorrow and needs this rescheduled.   PCCs can you reschedule his PET scan please. Thanks.

## 2019-01-11 NOTE — Telephone Encounter (Signed)
Called pt and gave him phone # for [employer] so he can call them to reschedule.  Nothing further needed.

## 2019-01-12 ENCOUNTER — Ambulatory Visit (HOSPITAL_COMMUNITY): Payer: Medicaid Other

## 2019-01-15 DIAGNOSIS — I87333 Chronic venous hypertension (idiopathic) with ulcer and inflammation of bilateral lower extremity: Secondary | ICD-10-CM | POA: Diagnosis not present

## 2019-01-19 ENCOUNTER — Encounter (HOSPITAL_BASED_OUTPATIENT_CLINIC_OR_DEPARTMENT_OTHER): Payer: Medicare Other | Attending: Internal Medicine

## 2019-01-19 DIAGNOSIS — J449 Chronic obstructive pulmonary disease, unspecified: Secondary | ICD-10-CM | POA: Diagnosis not present

## 2019-01-19 DIAGNOSIS — L309 Dermatitis, unspecified: Secondary | ICD-10-CM | POA: Insufficient documentation

## 2019-01-23 ENCOUNTER — Encounter (HOSPITAL_BASED_OUTPATIENT_CLINIC_OR_DEPARTMENT_OTHER): Payer: Medicare Other

## 2019-01-23 DIAGNOSIS — L309 Dermatitis, unspecified: Secondary | ICD-10-CM | POA: Diagnosis not present

## 2019-01-30 ENCOUNTER — Ambulatory Visit: Payer: Medicare Other | Admitting: Internal Medicine

## 2019-02-01 DIAGNOSIS — L309 Dermatitis, unspecified: Secondary | ICD-10-CM | POA: Diagnosis not present

## 2019-02-20 ENCOUNTER — Encounter (HOSPITAL_BASED_OUTPATIENT_CLINIC_OR_DEPARTMENT_OTHER): Payer: Medicare Other | Attending: Internal Medicine | Admitting: Internal Medicine

## 2019-02-22 ENCOUNTER — Ambulatory Visit (HOSPITAL_COMMUNITY): Payer: Medicare Other

## 2019-02-27 ENCOUNTER — Other Ambulatory Visit: Payer: Self-pay

## 2019-02-27 ENCOUNTER — Encounter (HOSPITAL_BASED_OUTPATIENT_CLINIC_OR_DEPARTMENT_OTHER): Payer: Medicare Other | Admitting: Internal Medicine

## 2019-02-27 DIAGNOSIS — I890 Lymphedema, not elsewhere classified: Secondary | ICD-10-CM | POA: Diagnosis not present

## 2019-02-27 DIAGNOSIS — L97812 Non-pressure chronic ulcer of other part of right lower leg with fat layer exposed: Secondary | ICD-10-CM | POA: Insufficient documentation

## 2019-02-27 DIAGNOSIS — I87333 Chronic venous hypertension (idiopathic) with ulcer and inflammation of bilateral lower extremity: Secondary | ICD-10-CM | POA: Insufficient documentation

## 2019-02-27 DIAGNOSIS — L97822 Non-pressure chronic ulcer of other part of left lower leg with fat layer exposed: Secondary | ICD-10-CM | POA: Insufficient documentation

## 2019-02-27 DIAGNOSIS — I872 Venous insufficiency (chronic) (peripheral): Secondary | ICD-10-CM | POA: Insufficient documentation

## 2019-03-01 ENCOUNTER — Encounter (HOSPITAL_COMMUNITY)
Admission: RE | Admit: 2019-03-01 | Discharge: 2019-03-01 | Disposition: A | Discharge status: Home / Self Care | Payer: Medicare Other | Source: Ambulatory Visit | Attending: Internal Medicine | Admitting: Internal Medicine

## 2019-03-01 ENCOUNTER — Other Ambulatory Visit: Payer: Self-pay

## 2019-03-01 ENCOUNTER — Encounter (HOSPITAL_BASED_OUTPATIENT_CLINIC_OR_DEPARTMENT_OTHER): Payer: Medicare Other | Admitting: Internal Medicine

## 2019-03-01 ENCOUNTER — Encounter (HOSPITAL_COMMUNITY): Admission: RE | Admit: 2019-03-01 | Payer: Medicare Other | Source: Ambulatory Visit

## 2019-03-01 DIAGNOSIS — C7989 Secondary malignant neoplasm of other specified sites: Secondary | ICD-10-CM | POA: Diagnosis not present

## 2019-03-01 DIAGNOSIS — C7951 Secondary malignant neoplasm of bone: Secondary | ICD-10-CM | POA: Diagnosis not present

## 2019-03-01 DIAGNOSIS — I700 Atherosclerosis of aorta: Secondary | ICD-10-CM | POA: Diagnosis not present

## 2019-03-01 DIAGNOSIS — R911 Solitary pulmonary nodule: Secondary | ICD-10-CM | POA: Diagnosis not present

## 2019-03-01 DIAGNOSIS — Z79899 Other long term (current) drug therapy: Secondary | ICD-10-CM | POA: Insufficient documentation

## 2019-03-01 DIAGNOSIS — J449 Chronic obstructive pulmonary disease, unspecified: Secondary | ICD-10-CM | POA: Insufficient documentation

## 2019-03-01 LAB — GLUCOSE, CAPILLARY: Glucose-Capillary: 77 mg/dL (ref 70–99)

## 2019-03-01 MED ORDER — FLUDEOXYGLUCOSE F - 18 (FDG) INJECTION
8.2800 | Freq: Once | INTRAVENOUS | Status: AC
  Administered 2019-03-01: 8.28 via INTRAVENOUS

## 2019-03-06 ENCOUNTER — Telehealth: Payer: Self-pay | Admitting: Internal Medicine

## 2019-03-06 ENCOUNTER — Other Ambulatory Visit: Payer: Self-pay

## 2019-03-06 ENCOUNTER — Encounter (HOSPITAL_BASED_OUTPATIENT_CLINIC_OR_DEPARTMENT_OTHER): Payer: Medicare Other | Attending: Internal Medicine | Admitting: Internal Medicine

## 2019-03-06 DIAGNOSIS — I87333 Chronic venous hypertension (idiopathic) with ulcer and inflammation of bilateral lower extremity: Secondary | ICD-10-CM | POA: Diagnosis not present

## 2019-03-06 NOTE — Telephone Encounter (Signed)
PCCs, can you help with this? Thank you.  

## 2019-03-06 NOTE — Telephone Encounter (Signed)
LM for patient to schedule COVID test.

## 2019-03-07 NOTE — Telephone Encounter (Signed)
COVID test scheduled for 10/26 @ 1:10 w/ pt.  Nothing further needed at this time.

## 2019-03-08 ENCOUNTER — Telehealth: Payer: Self-pay | Admitting: Internal Medicine

## 2019-03-08 MED ORDER — TRELEGY ELLIPTA 100-62.5-25 MCG/INH IN AEPB: 1.0000 | INHALATION_SPRAY | Freq: Every day | RESPIRATORY_TRACT | 0 refills | Status: DC

## 2019-03-08 NOTE — Telephone Encounter (Signed)
Pt calling back for samples of Trelogy.  Having a hard time breathing.  Used nebulizer twice today. Please advise.

## 2019-03-08 NOTE — Telephone Encounter (Signed)
Will address on 03/09/19

## 2019-03-08 NOTE — Telephone Encounter (Signed)
Called and spoke to pt and pt's friend, [name]. Pt is needing trelegy sample. Pt states he just recently completed the application for pt assistance and will drop it off while picking up samples, two samples have been left up front for pick up. Pt also states he is feeling more SOB, fatigued, and having a prod cough. Pt wasn't able to finish full sentences while on the phone. I asked pt if he feels his SOB is bad enough to go to ED, pt refused. Televisit appt has been made with MW on 10/23 at 0900. Pt verbalized understanding and denied any further questions or concerns at this time.

## 2019-03-09 ENCOUNTER — Ambulatory Visit (INDEPENDENT_AMBULATORY_CARE_PROVIDER_SITE_OTHER): Payer: Medicare Other | Admitting: Internal Medicine

## 2019-03-09 ENCOUNTER — Encounter: Payer: Self-pay | Admitting: Internal Medicine

## 2019-03-09 DIAGNOSIS — R911 Solitary pulmonary nodule: Secondary | ICD-10-CM | POA: Diagnosis not present

## 2019-03-09 DIAGNOSIS — J449 Chronic obstructive pulmonary disease, unspecified: Secondary | ICD-10-CM | POA: Diagnosis not present

## 2019-03-09 MED ORDER — TRELEGY ELLIPTA 100-62.5-25 MCG/INH IN AEPB: 1.0000 | INHALATION_SPRAY | Freq: Every day | RESPIRATORY_TRACT | 11 refills | Status: DC

## 2019-03-09 NOTE — Assessment & Plan Note (Signed)
Quit smoking 01/2019 - Spirometry 08/02/2016  FEV1 1.25 (33%)  Ratio 45  p advair am of study  - 08/02/2016    try bevespi x 4 week samples  - 09/03/2016  After extensive coaching HFA effectiveness =   90% > continue bevespi - 12/03/2016 changed to trelegy by communtiy wellness > pulmonary f/u prn  - 05/08/2018  After extensive coaching inhaler device,  effectiveness =    75% (short Ti) try bevespi 2 bid x 4 week samples and return with formulary in 4 weeks  - 10/30/2018  After extensive coaching inhaler device,  effectiveness =    75% hfa/ 90% dpi    Group D in terms of symptom/risk and laba/lama/ICS  therefore appropriate rx at this point >>>  Better while on Trelegy > continue one click daily plus prn saba/ working on access per [employer] for free Trelegy

## 2019-03-09 NOTE — Telephone Encounter (Signed)
Waiting for rx for trelegy to be signed by MW and will fax the forms

## 2019-03-09 NOTE — Assessment & Plan Note (Signed)
Incidental finding on cxr 10/27/18 > CT s contrast rec 10/30/2018 > not done until 01/08/2019 : 5.9 cm mass in the posterior left upper lobe, as described above,suspicious for primary bronchogenic neoplasm. Possible lymphangitic spread versus post structure opacity in the left upper lobe. Small mediastinal lymph nodes, including a 9 mm short axis prevascular node, indeterminate but suspicious   - PET 02/20/2019  1. 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 2. Hypermetabolic ipsilateral mediastinal lymph node metastases in the AP window and left paraesophageal chains. 3. Hypermetabolic distant nodal metastasis in the left external iliac chain. 4. Hypermetabolic posterior left chest wall metastasis between left sixth and seventh ribs. 5. Hypermetabolic osseous metastases to the left sixth rib. 6. PET-CT stage IVB (T4 N2 M1c). 7. New 5 mm right upper lobe pulmonary nodule and slight growth of 4 mm right middle lobe pulmonary nodule, below PET resolution, cannot exclude pulmonary metastase >  For fob 03/15/2019   Discussed in detail all the  indications, usual  risks and alternatives  relative to the benefits with patient who agrees to proceed with bronchoscopy with biopsy.

## 2019-03-09 NOTE — Progress Notes (Signed)
Subjective:     Patient ID: Omar Wagner, male   DOB: 1955-10-28,    MRN: [id number]     Brief patient profile:  59 yowm recurrent smoker with onset of doe x 2010 with h/o freq bronchitis episodes prior to quit smoking which is some better but the breathing is not despite advair disc x 2016 and neb daily and saba hfa much less but referred to pulmonary clinic 08/02/2016 by EDP at [facility] p flare 02/23/16     History of Present Illness  .08/02/2016 1st [facility] office visit/ Eleanore Junio  maint advair 250 /daily neb  Chief Complaint  Patient presents with  . Pulmonary Consult    Self referral for COPD. Pt wants to apply for disability for his COPD.  He c/o SOB for the past 3-4 years. He states breathing bothers him when walking up any sort of incline. He has occ cough and wheezing. He is using albuterol neb and albuterol inhaler once daily on average.   doe = MMRC1 = can walk nl pace, flat grade, can't hurry or go uphills or steps s sob  Also carrying lumber > 5 lb anywhere Onset was insidious x 10 y prior to [place]  And progressive with only one flare oin 02/2016 for which rx in ER (see notes) and none since quit smoking  rec Plan A = Automatic = Stop advair and start Bevespi Take 2 puffs first thing in am and then another 2 puffs about 12 hours later Plan B = Backup Only use your albuterol (Ventolin) as a rescue medication  Plan C = Crisis - only use your albuterol nebulizer if you first try Plan B and it fails to help > ok to use the nebulizer up to every 4 hours but if start needing it regularly call for immediate appointment     09/03/2016  f/u ov/[facility] Lust re:   GOLD III copd/ sp smoking cessation [id number]  Chief Complaint  Patient presents with  . Follow-up    Breathing is some better. No new co's today. He is using ventolin HFA 4 x per wk on average and has not needed to use.   copd  III on trelegy liked  bevespi better  Doe still = MMRC1  rec Plan A = Automatic = Stop advair and start  Bevespi Take 2 puffs first thing in am and then another 2 puffs about 12 hours later Plan B = Backup Only use your albuterol (Ventolin) as a rescue medication Plan C = Crisis - only use your albuterol nebulizer if you first try Plan B     12/03/2016  f/u ov/Sahej Hauswirth re:  GOLD III/ still smoking on trelegy plus prn saba up to 3 x daily hfa only ,no neb  Chief Complaint  Patient presents with  . Follow-up    Breathing is overall doing well. He has noticed occ palpitations since started on Trelegy by his PCP approx 2 months ago. He uses his albuterol inhaler 3 x per wk on average and he has not needed neb.    doe still = MMRC1 = can walk nl pace, flat grade, can't hurry or go uphills or steps s sob   Some hoarseness from trelegy he didn't have on bevespi plus palp noted, better on lopressor tid rec Plan A = Automatic =  Trelegy one click each am then rinse and gargle Plan B = Backup Only use your albuterol (Ventolin) as a rescue medication Plan C = Crisis - only use your  albuterol nebulizer if you first try Plan B     05/08/2018  f/u ov/Bell Cai re:  GOLD III still smoking - maint rx with trelegy Laurine Blazer - trelegy too expensive under his plan but did not provide list of alternatives  Chief Complaint  Patient presents with  . Follow-up    Breathing is doing well. He is using his albuterol inhaler 2-3 x per wk.   Dyspnea:  MMRC1 = can walk nl pace, flat grade, can't hurry or go uphills or steps s sob   Cough: rattling in am worse Sleeping: sits up due sob SABA use: a few times a week 02: none   rec Plan A = Automatic = Bevespi Take 2 puffs first thing in am and then another 2 puffs about 12 hours later.  Work on inhaler technique:   Plan B = Backup Only use your albuterol (ventolin)  inhaler as a rescue medication  Plan C = Crisis - only use your albuterol nebulizer if you first try Plan B Please schedule a follow up office visit in 4 weeks, sooner if needed  with all medications  /inhalers/ solutions in hand so we can verify exactly what you are taking. This includes all medications from all doctors and over the counters  And your formulary list for alternatives       10/30/2018  f/u ov/Fabiha Rougeau re: GOLD III/ can't afford maint rx  Chief Complaint  Patient presents with  . Follow-up    Breathing is about the same.  He is using his albuterol inhaler 12 x per wk on average and neb 1-2 x per wk.   Dyspnea:  Riding around neighborhood x half an hour on bike is his only activity but doe still = MMRC2 = can't walk a nl pace on a flat grade s sob but does fine slow and flat  Cough: none  Sleeping: at 45 degrees in recliner  SABA use: as above - way too much when off maint rx / trelegy has worked the best when he can afford it. 02: none  rec Plan A = Automatic = Trelegy one click each am - take two good drags off of one click  Plan B = Backup Only use your albuterol inhaler as a rescue medication Plan C = Crisis - only use your albuterol nebulizer if you first try Plan B  Please schedule a follow up visit in 3 months but call sooner if needed  with all medications /inhalers/ solutions in hand so we can verify exactly what you are taking. This includes all medications from all doctors and over the counters    Virtual Visit via Telephone Note 03/09/2019   I connected with Omar Wagner on 03/09/19 at  9:00 AM EDT by telephone and verified that I am speaking with the correct person using two identifiers.   I discussed the limitations, risks, security and privacy concerns of performing an evaluation and management service by telephone and the availability of in person appointments. I also discussed with the patient that there may be a patient responsible charge related to this service. The patient expressed understanding and agreed to proceed.   History of Present Illness: GOLD III s/p smoking cessation 01/2019 Dyspnea:  Room to room Cough: better since quit smoking   Sleeping: in recliner 45 degrees  SABA use: much less while on trelegy 02: none    No obvious day to day or daytime variability or assoc excess/ purulent sputum or mucus plugs or hemoptysis  or cp or chest tightness, subjective wheeze or overt sinus or hb symptoms.    Also denies any obvious fluctuation of symptoms with weather or environmental changes or other aggravating or alleviating factors except as outlined above.   Meds reviewed/ med reconciliation completed         Observations/Objective: slt hoarse, able to speak full sentences   Assessment and Plan: See problem list for active a/p's   Follow Up Instructions: See avs for instructions unique to this ov which includes revised/ updated med list     I discussed the assessment and treatment plan with the patient. The patient was provided an opportunity to ask questions and all were answered. The patient agreed with the plan and demonstrated an understanding of the instructions.   The patient was advised to call back or seek an in-person evaluation if the symptoms worsen or if the condition fails to improve as anticipated.  I provided 25 minutes of non-face-to-face time during this encounter.   Christinia Gully, MD

## 2019-03-09 NOTE — Telephone Encounter (Signed)
Rx signed and forms faxed

## 2019-03-12 ENCOUNTER — Inpatient Hospital Stay (HOSPITAL_COMMUNITY): Admission: RE | Admit: 2019-03-12 | Payer: Medicare Other | Source: Ambulatory Visit

## 2019-03-13 ENCOUNTER — Other Ambulatory Visit (HOSPITAL_COMMUNITY)
Admission: RE | Admit: 2019-03-13 | Discharge: 2019-03-13 | Disposition: A | Discharge status: Home / Self Care | Payer: Medicare Other | Source: Ambulatory Visit | Attending: Internal Medicine | Admitting: Internal Medicine

## 2019-03-13 ENCOUNTER — Ambulatory Visit (HOSPITAL_BASED_OUTPATIENT_CLINIC_OR_DEPARTMENT_OTHER): Payer: Medicare Other | Admitting: Internal Medicine

## 2019-03-14 ENCOUNTER — Telehealth: Payer: Self-pay | Admitting: Internal Medicine

## 2019-03-14 DIAGNOSIS — R911 Solitary pulmonary nodule: Secondary | ICD-10-CM

## 2019-03-14 MED ORDER — ALBUTEROL SULFATE HFA 108 (90 BASE) MCG/ACT IN AERS: 2.0000 | INHALATION_SPRAY | RESPIRATORY_TRACT | 1 refills | Status: AC | PRN

## 2019-03-14 MED ORDER — ALBUTEROL SULFATE 0.63 MG/3ML IN NEBU: 1.0000 | INHALATION_SOLUTION | Freq: Four times a day (QID) | RESPIRATORY_TRACT | 2 refills | Status: AC | PRN

## 2019-03-14 MED ORDER — AZITHROMYCIN 250 MG PO TABS: 250.0000 mg | ORAL_TABLET | ORAL | 0 refills | Status: DC

## 2019-03-14 MED ORDER — PREDNISONE 10 MG PO TABS: ORAL_TABLET | ORAL | 0 refills | Status: DC

## 2019-03-14 NOTE — Telephone Encounter (Signed)
Spoke with Valetta Fuller to verify the msg  She states that the pt wanted to cancel the bronch for tomorrow bc he was not feeling well and felt like he is having a COPD flare  Called and spoke with the pt  He states coughing up sand colored sputum- minimal amount, wheezing, increased DOE and chest tightness  He states gets winded walking across the room  No f/c/s I mentioned to him that per MW we should change his bronch to a CT guided Bx and he was okay with this but had many questions regarding procedure and wanted to discuss first before scheduling this  Dr Melvyn Novas can you please give him a call?

## 2019-03-14 NOTE — Progress Notes (Signed)
Pt no show covid testing appt 03/13/19. Attempted to call pt to reschedule. Left voicemail for pt to return call

## 2019-03-14 NOTE — Telephone Encounter (Signed)
Rxs were sent   Pt notified  Forwarding to myself and will route to MW to call him tomorrow PM

## 2019-03-14 NOTE — Telephone Encounter (Signed)
Prednisone 10 mg take  4 each am x 2 days,   2 each am x 2 days,  1 each am x 2 days and stop  zpak  I will call him later tomorrow pm to see how he's responding to above and see if can get the bx scheduled - won't be till next week at the earliest anyway.

## 2019-03-15 ENCOUNTER — Inpatient Hospital Stay (HOSPITAL_COMMUNITY): Admission: RE | Admit: 2019-03-15 | Payer: Medicare Other | Source: Ambulatory Visit

## 2019-03-15 ENCOUNTER — Encounter (HOSPITAL_COMMUNITY): Payer: Medicare Other

## 2019-03-15 ENCOUNTER — Encounter (HOSPITAL_COMMUNITY): Admission: RE | Payer: Self-pay | Source: Ambulatory Visit

## 2019-03-15 ENCOUNTER — Ambulatory Visit (HOSPITAL_COMMUNITY): Admission: RE | Admit: 2019-03-15 | Payer: Medicare Other | Source: Ambulatory Visit | Admitting: Internal Medicine

## 2019-03-15 SURGERY — BRONCHOSCOPY, WITH FLUOROSCOPY
Anesthesia: Moderate Sedation | Laterality: Bilateral

## 2019-03-15 NOTE — Telephone Encounter (Signed)
Refill albuterol neb x 3 m Proceed with ct bx lung mass by ir    Discussed in detail all the  indications, usual  risks and alternatives  relative to the benefits with patient who agrees to proceed with w/u as outlined.

## 2019-03-15 NOTE — Telephone Encounter (Signed)
Please call pt to decide on bx thanks

## 2019-03-15 NOTE — Telephone Encounter (Signed)
CT Biopsy was ordered and albuterol already refilled yesterday

## 2019-03-19 ENCOUNTER — Encounter (HOSPITAL_COMMUNITY): Payer: Self-pay

## 2019-03-19 NOTE — Progress Notes (Signed)
[name] Male, 63 y.o., 17-Feb-1956 MRN:  [id number] Phone:  [phone] Jerilynn Mages) PCP:  Marliss Coots, NP Primary Cvg:  Medicare/Medicare Part B Next Appt With Wound Care 03/21/2019 at 12:30 PM  RE: Biopsy Received: 3 days ago Message Contents  Arne Cleveland, MD  Lenore Cordia  Ok   CT core post LUL lung lesion @ chest wall   DDH   Previous Messages  ----- Message -----  From: Lenore Cordia  Sent: 03/16/2019 11:21 AM EDT  To: Ir Procedure Requests  Subject: Biopsy                      Procedure Requested: CT Biopsy    Reason for Procedure: Lung Mass    Provider Requesting: Tanda Rockers, MD  Provider Telephone: [phone]    Other Info: Rad exam in Epic

## 2019-03-21 ENCOUNTER — Other Ambulatory Visit: Payer: Self-pay

## 2019-03-21 ENCOUNTER — Encounter (HOSPITAL_BASED_OUTPATIENT_CLINIC_OR_DEPARTMENT_OTHER): Payer: Medicare Other | Attending: Physician Assistant | Admitting: Physician Assistant

## 2019-03-21 DIAGNOSIS — L97821 Non-pressure chronic ulcer of other part of left lower leg limited to breakdown of skin: Secondary | ICD-10-CM | POA: Diagnosis not present

## 2019-03-21 DIAGNOSIS — J449 Chronic obstructive pulmonary disease, unspecified: Secondary | ICD-10-CM | POA: Insufficient documentation

## 2019-03-21 DIAGNOSIS — L97811 Non-pressure chronic ulcer of other part of right lower leg limited to breakdown of skin: Secondary | ICD-10-CM | POA: Diagnosis not present

## 2019-03-21 DIAGNOSIS — L97819 Non-pressure chronic ulcer of other part of right lower leg with unspecified severity: Secondary | ICD-10-CM | POA: Diagnosis present

## 2019-03-21 DIAGNOSIS — I890 Lymphedema, not elsewhere classified: Secondary | ICD-10-CM | POA: Insufficient documentation

## 2019-03-21 DIAGNOSIS — I87333 Chronic venous hypertension (idiopathic) with ulcer and inflammation of bilateral lower extremity: Secondary | ICD-10-CM | POA: Insufficient documentation

## 2019-03-21 DIAGNOSIS — F17200 Nicotine dependence, unspecified, uncomplicated: Secondary | ICD-10-CM | POA: Diagnosis not present

## 2019-03-21 NOTE — Progress Notes (Signed)
Omar, Wagner ([id number]) Visit Report for 03/21/2019 Chief Complaint Document Details Patient Name: Date of Service: Omar Wagner, Omar Wagner 03/21/2019 12:30 PM Medical Record [id number] Patient Account Number: [id number] Date of Birth/Sex: Treating RN: 06/13/1955 (63 y.o. Omar Wagner Primary Care Provider: Jack Wagner Other Clinician: Referring Provider: Treating Provider/Extender:Omar Wagner: 3 Information Obtained from: Patient Chief Complaint 11/10/2018; patient is here for review of wounds on his bilateral lower extremities Electronic Signature(s) Signed: 03/21/2019 6:48:11 PM By: Worthy Keeler PA-C Entered By: Worthy Keeler on 03/21/2019 13:35:10 -------------------------------------------------------------------------------- HPI Details Patient Name: Date of Service: Omar Wagner. 03/21/2019 12:30 PM Medical Record [id number] Patient Account Number: [id number] Date of Birth/Sex: Treating RN: 09/14/1955 (63 y.o. Omar Wagner Primary Care Provider: Jack Wagner Other Clinician: Referring Provider: Treating Provider/Extender:Omar Wagner: 3 History of Present Illness HPI Description: ADMISSION 11/10/2018 This is a 63 year old man who is not a diabetic. Primary chronic diseases golds 3 COPD. He tells me that he has had swelling in his legs for the last 3 years although I note that he had a DVT rule out study in epic in 2014 in any case he started to develop erythema and skin loss in January of this year. He tells me he had some difficulty getting into our clinic, I really do not know what the issue is. He was in the ER on 10/27/2018 noted that he had bilateral leg swelling and weeping. He was diagnosed with lymphedema and referred here. He may have had more recent venous ultrasounds through his primary care but I do not see that in our records. He has had compression stockings ordered  in the past but has not picked these up. He is simply been putting drying pads on his legs. He cannot wear long pants because of the weeping edema fluid. Past medical history; Gold 3 COPD, cataracts, PSVT, history of squamous cell CA of the skin, seborrheic keratosis, history of basal cell CA the skin, 2016 admit with cellulitis and abscess of the right lower extremity. Substance abuse He has not had arterial studies and because of the degree of skin loss in his lower extremities we were not able to do ABIs today 7/6- Patient is back after his first week with 3layer compression, swelling is a bit better, has been dressed with silver alginate 7/14; I think things are a little better he still has far too much edema in his lower extremities chronic venous insufficiency and lymphedema. Large areas bilaterally 7/20; he continues to have very significant improvement in his lower extremity edema which is likely secondary to chronic venous insufficiency and secondary lymphedema. He still has a large area on the left lateral calf and a smaller area on the right lateral calf. He also has systemic fluid volume overload a lot of edema in his thighs also his hands. This was present on admission so I do not think all of this is pushing fluid from his lower legs up into his system 7/28; he continues to have good improvement. He only has a small area left on the right lateral calf. Somewhat more substantial area on the left anterior tibia. Mostly everything looks better here 8/4-Patient returns at 1 week, the area on the right lateral calf seems to have healed, left anterior tibial wound slightly better, left lateral calf 2 small areas appear to be better. We are using Prisma on these wounds under 4 layer compression 8/11-Patient returns at 1  week with small new area on the right lower leg where he says he picked at a loose piece of skin, this does not seem to be much more than small abrasion His left  anterior leg wound appears to be about the same where using Silver alginate with 3 layer compression 01/03/2019 on evaluation today patient actually appears to be doing quite well with regard to his bilateral lower extremities. In fact his left lower extremity is the only area where I see an open wound still remaining the right seems to be completely healed. We have been using the silver alginate dressing with a 3 layer compression. 02/01/2019. The patient's wounds on his bilateral lower extremities are healed. He has dry flaking chronic eczematous skin. Thick patches and scales on predominantly the left leg left heel greater than right. He has been resistant to getting compression stockings although we have emphasized this needs to be done to maintain his skin integrity. He has not been seen by a doctor here in less than a month but he has been in for nurse visits 02/27/2019; patient returns to clinic with substantial bilateral circumferential epithelial loss. He tells me this happened directly after he left the clinic. He states he did get stockings from elastic therapy. He has not been systemically unwell 10/20; the patient was readmitted to the clinic last week. He had bilateral circumferential epithelial loss secondary to severe edema and marked stasis dermatitis right greater than left. I did not give him antibiotics. We put him back in 4 layer compression with TCA. We had him back for a nurse visit. He comes back in clinic and both legs look a lot better. He still has opened areas with loss of surface epithelium Wounds on the bilateral lower extremities. He mainly has a couple areas open on the right lower extremity 1 on the toe wound and the leg11/08/2018 on evaluation today patient actually appears to be doing fairly well with regard to his. He really does not have anything open significantly on the left although he did pick it a couple of the dry skin areas which made them bleed just a  little bit but again these do not really wounds. Fortunately there is no evidence of active infection at this time. No fevers, chills, nausea, vomiting, or diarrhea. Electronic Signature(s) Signed: 03/21/2019 6:48:11 PM By: Worthy Keeler PA-C Entered By: Worthy Keeler on 03/21/2019 14:13:49 -------------------------------------------------------------------------------- Physical Exam Details Patient Name: Date of Service: Omar Wagner, Omar Wagner 03/21/2019 12:30 PM Medical Record [id number] Patient Account Number: [id number] Date of Birth/Sex: Treating RN: [birth year] (63 y.o. Omar Wagner Primary Care Provider: Other Clinician: Jack Wagner Referring Provider: Treating Provider/Extender:Omar Wagner: 3 Constitutional Well-nourished and well-hydrated in no acute distress. Respiratory normal breathing without difficulty. clear to auscultation bilaterally. Cardiovascular regular rate and rhythm with normal S1, S2. Psychiatric this patient is able to make decisions and demonstrates good insight into disease process. Alert and Oriented x 3. pleasant and cooperative. Notes [name] wound bed currently showed signs of good granulation at this time fortunately there is no evidence of again active infection at this point which is excellent news. I do not see any need for sharp debridement today which is great news. Electronic Signature(s) Signed: 03/21/2019 6:48:11 PM By: Worthy Keeler PA-C Entered By: Worthy Keeler on 03/21/2019 14:15:00 -------------------------------------------------------------------------------- Physician Orders Details Patient Name: Date of Service: Omar Wagner. 03/21/2019 12:30 PM Medical Record [id number] Patient Account Number: [id number] Date of  Birth/Sex: Treating RN: 1955-11-13 (63 y.o. Omar Wagner Primary Care Provider: Jack Wagner Other Clinician: Referring Provider: Treating  Provider/Extender:Omar Wagner: 3 Verbal / Phone Orders: No Diagnosis Coding ICD-10 Coding Code Description [phone] Chronic venous hypertension (idiopathic) with ulcer and inflammation of bilateral lower extremity L97.811 Non-pressure chronic ulcer of other part of right lower leg limited to breakdown of skin L97.821 Non-pressure chronic ulcer of other part of left lower leg limited to breakdown of skin Follow-up Appointments Return Appointment in 1 week. Dressing Change Frequency Do not change entire dressing for one week. - both legs Skin Barriers/Peri-Wound Care TCA Cream or Ointment - cream mixed with lotion liberally to both legs Wound Cleansing May shower with protection. - both legs Primary Wound Dressing Wound #4 Right,Posterior Lower Leg Calcium Alginate with Silver - and any weeping areas Wound #6 Right Toe Great Calcium Alginate with Silver - and any weeping areas Secondary Dressing Wound #4 Right,Posterior Lower Leg Dry Gauze Wound #6 Right Toe Great Kerlix/Rolled Gauze Dry Gauze Edema Control 4 layer compression - Bilateral Avoid standing for long periods of time Elevate legs to the level of the heart or above for 30 minutes daily and/or when sitting, a frequency of: - throughout day. Exercise regularly Support Garment 20-30 mm/Hg pressure to: - pt to purchase and bring stockings to appointment next week Electronic Signature(s) Signed: 03/21/2019 6:42:58 PM By: Baruch Gouty RN, BSN Signed: 03/21/2019 6:48:11 PM By: Worthy Keeler PA-C Entered By: Baruch Gouty on 03/21/2019 14:13:46 -------------------------------------------------------------------------------- Problem List Details Patient Name: Date of Service: Omar Wagner. 03/21/2019 12:30 PM Medical Record [id number] Patient Account Number: [id number] Date of Birth/Sex: Treating RN: 04-Dec-1955 (64 y.o. Omar Wagner Primary Care Provider: Jack Wagner Other Clinician: Referring Provider: Treating Provider/Extender:Omar Wagner: 3 Active Problems ICD-10 Evaluated Encounter Code Description Active Date Today Diagnosis I87.333 Chronic venous hypertension (idiopathic) with ulcer 02/27/2019 No Yes and inflammation of bilateral lower extremity L97.811 Non-pressure chronic ulcer of other part of right lower 02/27/2019 No Yes leg limited to breakdown of skin L97.821 Non-pressure chronic ulcer of other part of left lower 02/27/2019 No Yes leg limited to breakdown of skin Inactive Problems Resolved Problems Electronic Signature(s) Signed: 03/21/2019 6:48:11 PM By: Worthy Keeler PA-C Entered By: Worthy Keeler on 03/21/2019 13:35:05 -------------------------------------------------------------------------------- Progress Note Details Patient Name: Date of Service: Omar Wagner. 03/21/2019 12:30 PM Medical Record [id number] Patient Account Number: [id number] Date of Birth/Sex: Treating RN: [birth year] (63 y.o. Omar Wagner Primary Care Provider: Jack Wagner Other Clinician: Referring Provider: Treating Provider/Extender:Omar Wagner: 3 Subjective Chief Complaint Information obtained from Patient 11/10/2018; patient is here for review of wounds on his bilateral lower extremities History of Present Illness (HPI) ADMISSION 11/10/2018 This is a 63 year old man who is not a diabetic. Primary chronic diseases golds 3 COPD. He tells me that he has had swelling in his legs for the last 3 years although I note that he had a DVT rule out study in epic in 2014 in any case he started to develop erythema and skin loss in January of this year. He tells me he had some difficulty getting into our clinic, I really do not know what the issue is. He was in the ER on 10/27/2018 noted that he had bilateral leg swelling and weeping. He was diagnosed with lymphedema  and referred here. He may have had more recent venous ultrasounds through  his primary care but I do not see that in our records. He has had compression stockings ordered in the past but has not picked these up. He is simply been putting drying pads on his legs. He cannot wear long pants because of the weeping edema fluid. Past medical history; Gold 3 COPD, cataracts, PSVT, history of squamous cell CA of the skin, seborrheic keratosis, history of basal cell CA the skin, 2016 admit with cellulitis and abscess of the right lower extremity. Substance abuse He has not had arterial studies and because of the degree of skin loss in his lower extremities we were not able to do ABIs today 7/6- Patient is back after his first week with 3layer compression, swelling is a bit better, has been dressed with silver alginate 7/14; I think things are a little better he still has far too much edema in his lower extremities chronic venous insufficiency and lymphedema. Large areas bilaterally 7/20; he continues to have very significant improvement in his lower extremity edema which is likely secondary to chronic venous insufficiency and secondary lymphedema. He still has a large area on the left lateral calf and a smaller area on the right lateral calf. He also has systemic fluid volume overload a lot of edema in his thighs also his hands. This was present on admission so I do not think all of this is pushing fluid from his lower legs up into his system 7/28; he continues to have good improvement. He only has a small area left on the right lateral calf. Somewhat more substantial area on the left anterior tibia. Mostly everything looks better here 8/4-Patient returns at 1 week, the area on the right lateral calf seems to have healed, left anterior tibial wound slightly better, left lateral calf 2 small areas appear to be better. We are using Prisma on these wounds under 4 layer compression 8/11-Patient returns at 1  week with small new area on the right lower leg where he says he picked at a loose piece of skin, this does not seem to be much more than small abrasion His left anterior leg wound appears to be about the same where using Silver alginate with 3 layer compression 01/03/2019 on evaluation today patient actually appears to be doing quite well with regard to his bilateral lower extremities. In fact his left lower extremity is the only area where I see an open wound still remaining the right seems to be completely healed. We have been using the silver alginate dressing with a 3 layer compression. 02/01/2019. The patient's wounds on his bilateral lower extremities are healed. He has dry flaking chronic eczematous skin. Thick patches and scales on predominantly the left leg left heel greater than right. He has been resistant to getting compression stockings although we have emphasized this needs to be done to maintain his skin integrity. He has not been seen by a doctor here in less than a month but he has been in for nurse visits 02/27/2019; patient returns to clinic with substantial bilateral circumferential epithelial loss. He tells me this happened directly after he left the clinic. He states he did get stockings from elastic therapy. He has not been systemically unwell 10/20; the patient was readmitted to the clinic last week. He had bilateral circumferential epithelial loss secondary to severe edema and marked stasis dermatitis right greater than left. I did not give him antibiotics. We put him back in 4 layer compression with TCA. We had him back for a nurse visit.  He comes back in clinic and both legs look a lot better. He still has opened areas with loss of surface epithelium Wounds on the bilateral lower extremities. He mainly has a couple areas open on the right lower extremity 1 on the toe wound and the leg11/08/2018 on evaluation today patient actually appears to be doing fairly well with regard  to his. He really does not have anything open significantly on the left although he did pick it a couple of the dry skin areas which made them bleed just a little bit but again these do not really wounds. Fortunately there is no evidence of active infection at this time. No fevers, chills, nausea, vomiting, or diarrhea. Patient History Information obtained from Patient. Family History Cancer - Father,Mother,Siblings, No family history of Diabetes, Heart Disease, Hereditary Spherocytosis, Hypertension, Kidney Disease, Lung Disease, Seizures, Stroke, Thyroid Problems, Tuberculosis. Social History Current every day smoker, Marital Status - Widowed, Alcohol Use - Moderate, Drug Use - Prior History - pot , heorin. Medical History Eyes Denies history of Cataracts, Glaucoma, Optic Neuritis Ear/Nose/Mouth/Throat Denies history of Chronic sinus problems/congestion, Middle ear problems Hematologic/Lymphatic Patient has history of Lymphedema Denies history of Anemia, Hemophilia, Human Immunodeficiency Virus, Sickle Cell Disease Respiratory Patient has history of Chronic Obstructive Pulmonary Disease (COPD) Denies history of Aspiration, Asthma, Pneumothorax, Sleep Apnea, Tuberculosis Cardiovascular Denies history of Angina, Arrhythmia, Congestive Heart Failure, Coronary Artery Disease, Deep Vein Thrombosis, Hypertension, Hypotension, Myocardial Infarction, Peripheral Arterial Disease, Peripheral Venous Disease, Phlebitis, Vasculitis Gastrointestinal Denies history of Cirrhosis , Colitis, Crohnoos, Hepatitis A, Hepatitis B, Hepatitis C Endocrine Denies history of Type I Diabetes, Type II Diabetes Genitourinary Denies history of End Stage Renal Disease Immunological Denies history of Lupus Erythematosus, Raynaudoos, Scleroderma Integumentary (Skin) Denies history of History of Burn Musculoskeletal Denies history of Gout, Rheumatoid Arthritis, Osteoarthritis,  Osteomyelitis Neurologic Denies history of Dementia, Neuropathy, Quadriplegia, Paraplegia, Seizure Disorder Oncologic Denies history of Received Chemotherapy, Received Radiation Psychiatric Denies history of Anorexia/bulimia, Confinement Anxiety Review of Systems (ROS) Constitutional Symptoms (General Health) Denies complaints or symptoms of Fatigue, Fever, Chills, Marked Weight Change. Respiratory Denies complaints or symptoms of Chronic or frequent coughs, Shortness of Breath. Cardiovascular Denies complaints or symptoms of Chest pain. Psychiatric Denies complaints or symptoms of Claustrophobia, Suicidal. Objective Constitutional Well-nourished and well-hydrated in no acute distress. Vitals Time Taken: 1:14 PM, Height: 72 in, Weight: 185 lbs, BMI: 25.1, Temperature: 98.1 F, Pulse: 120 bpm, Respiratory Rate: 17 breaths/min, Blood Pressure: 138/73 mmHg. Respiratory normal breathing without difficulty. clear to auscultation bilaterally. Cardiovascular regular rate and rhythm with normal S1, S2. Psychiatric this patient is able to make decisions and demonstrates good insight into disease process. Alert and Oriented x 3. pleasant and cooperative. General Notes: [name] wound bed currently showed signs of good granulation at this time fortunately there is no evidence of again active infection at this point which is excellent news. I do not see any need for sharp debridement today which is great news. Integumentary (Hair, Skin) Wound #4 status is Open. Original cause of wound was Gradually Appeared. The wound is located on the Right,Posterior Lower Leg. The wound measures 0.7cm length x 0.5cm width x 0.1cm depth; 0.275cm^2 area and 0.027cm^3 volume. There is Fat Layer (Subcutaneous Tissue) Exposed exposed. There is no tunneling or undermining noted. There is a medium amount of serosanguineous drainage noted. The wound margin is distinct with the outline attached to the wound base.  There is large (67-100%) pink granulation within the wound bed. There is a small (  1-33%) amount of necrotic tissue within the wound bed including Adherent Slough. Wound #5 status is Open. Original cause of wound was Gradually Appeared. The wound is located on the Left,Lateral Lower Leg. The wound measures 0cm length x 0cm width x 0cm depth; 0cm^2 area and 0cm^3 volume. Wound #6 status is Open. Original cause of wound was Gradually Appeared. The wound is located on the Right Toe Great. The wound measures 0.5cm length x 1cm width x 0.1cm depth; 0.393cm^2 area and 0.039cm^3 volume. There is no tunneling or undermining noted. There is a medium amount of serosanguineous drainage noted. The wound margin is distinct with the outline attached to the wound base. There is large (67-100%) red, pink granulation within the wound bed. There is no necrotic tissue within the wound bed. Assessment Active Problems ICD-10 Chronic venous hypertension (idiopathic) with ulcer and inflammation of bilateral lower extremity Non-pressure chronic ulcer of other part of right lower leg limited to breakdown of skin Non-pressure chronic ulcer of other part of left lower leg limited to breakdown of skin Procedures Wound #4 Pre-procedure diagnosis of Wound #4 is a Venous Leg Ulcer located on the Right,Posterior Lower Leg . There was a Four Layer Compression Therapy Procedure by [name], RN. Post procedure Diagnosis Wound #4: Same as Pre-Procedure There was a Four Layer Compression Therapy Procedure by [name], RN. Post procedure Diagnosis Wound #: Same as Pre-Procedure Plan Follow-up Appointments: Return Appointment in 1 week. Dressing Change Frequency: Do not change entire dressing for one week. - both legs Skin Barriers/Peri-Wound Care: TCA Cream or Ointment - cream mixed with lotion liberally to both legs Wound Cleansing: May shower with protection. - both legs Primary Wound Dressing: Wound #4  Right,Posterior Lower Leg: Calcium Alginate with Silver - and any weeping areas Wound #6 Right Toe Great: Calcium Alginate with Silver - and any weeping areas Secondary Dressing: Wound #4 Right,Posterior Lower Leg: Dry Gauze Wound #6 Right Toe Great: Kerlix/Rolled Gauze Dry Gauze Edema Control: 4 layer compression - Bilateral Avoid standing for long periods of time Elevate legs to the level of the heart or above for 30 minutes daily and/or when sitting, a frequency of: - throughout day. Exercise regularly Support Garment 20-30 mm/Hg pressure to: - pt to purchase and bring stockings to appointment next week 1. My suggestion at this point is going to be that we go ahead and continue with the compression wrap at this point since the patient seems to be doing well. 2. I would recommend we continue with the silver alginate at this point as well as that seems to be helping to dry up the areas. His legs are extremely dry in general but again I think that is completely separate from what needs to happen with the wounds. We need them to dry out. 3. I do recommend he continue to elevate his legs as much as possible. We will use a mixture of triamcinolone and lotion to his legs to help with the dry skin. We will see patient back for reevaluation in 1 week here in the clinic. If anything worsens or changes patient will contact our office for additional recommendations. Electronic Signature(s) Signed: 03/21/2019 6:48:11 PM By: Worthy Keeler PA-C Entered By: Worthy Keeler on 03/21/2019 14:16:35 -------------------------------------------------------------------------------- HxROS Details Patient Name: Date of Service: Omar Wagner. 03/21/2019 12:30 PM Medical Record [id number] Patient Account Number: [id number] Date of Birth/Sex: Treating RN: 1956-04-03 (63 y.o. Omar Wagner Primary Care Provider: Jack Wagner Other Clinician: Referring Provider: Treating  Provider/Extender:Omar Wagner: 3 Information Obtained From Patient Constitutional Symptoms (General Health) Complaints and Symptoms: Negative for: Fatigue; Fever; Chills; Marked Weight Change Respiratory Complaints and Symptoms: Negative for: Chronic or frequent coughs; Shortness of Breath Medical History: Positive for: Chronic Obstructive Pulmonary Disease (COPD) Negative for: Aspiration; Asthma; Pneumothorax; Sleep Apnea; Tuberculosis Cardiovascular Complaints and Symptoms: Negative for: Chest pain Medical History: Negative for: Angina; Arrhythmia; Congestive Heart Failure; Coronary Artery Disease; Deep Vein Thrombosis; Hypertension; Hypotension; Myocardial Infarction; Peripheral Arterial Disease; Peripheral Venous Disease; Phlebitis; Vasculitis Psychiatric Complaints and Symptoms: Negative for: Claustrophobia; Suicidal Medical History: Negative for: Anorexia/bulimia; Confinement Anxiety Eyes Medical History: Negative for: Cataracts; Glaucoma; Optic Neuritis Ear/Nose/Mouth/Throat Medical History: Negative for: Chronic sinus problems/congestion; Middle ear problems Hematologic/Lymphatic Medical History: Positive for: Lymphedema Negative for: Anemia; Hemophilia; Human Immunodeficiency Virus; Sickle Cell Disease Gastrointestinal Medical History: Negative for: Cirrhosis ; Colitis; Crohns; Hepatitis A; Hepatitis B; Hepatitis C Endocrine Medical History: Negative for: Type I Diabetes; Type II Diabetes Genitourinary Medical History: Negative for: End Stage Renal Disease Immunological Medical History: Negative for: Lupus Erythematosus; Raynauds; Scleroderma Integumentary (Skin) Medical History: Negative for: History of Burn Musculoskeletal Medical History: Negative for: Gout; Rheumatoid Arthritis; Osteoarthritis; Osteomyelitis Neurologic Medical History: Negative for: Dementia; Neuropathy; Quadriplegia; Paraplegia; Seizure  Disorder Oncologic Medical History: Negative for: Received Chemotherapy; Received Radiation Immunizations Pneumococcal Vaccine: Received Pneumococcal Vaccination: No Implantable Devices None Family and Social History Cancer: Yes - Father,Mother,Siblings; Diabetes: No; Heart Disease: No; Hereditary Spherocytosis: No; Hypertension: No; Kidney Disease: No; Lung Disease: No; Seizures: No; Stroke: No; Thyroid Problems: No; Tuberculosis: No; Current every day smoker; Marital Status - Widowed; Alcohol Use: Moderate; Drug Use: Prior History - pot , heorin; Financial Concerns: No; Food, Clothing or Shelter Needs: No; Support System Lacking: No Physician Affirmation I have reviewed and agree with the above information. Electronic Signature(s) Signed: 03/21/2019 6:42:58 PM By: Baruch Gouty RN, BSN Signed: 03/21/2019 6:48:11 PM By: Worthy Keeler PA-C Entered By: Worthy Keeler on 03/21/2019 14:14:09 -------------------------------------------------------------------------------- SuperBill Details Patient Name: Date of Service: Omar Wagner 03/21/2019 Medical Record [id number] Patient Account Number: [id number] Date of Birth/Sex: Treating RN: Sep 02, 1955 (63 y.o. Omar Wagner Primary Care Provider: Jack Wagner Other Clinician: Referring Provider: Treating Provider/Extender:Omar Wagner: 3 Diagnosis Coding ICD-10 Codes Code Description [phone] Chronic venous hypertension (idiopathic) with ulcer and inflammation of bilateral lower extremity L97.811 Non-pressure chronic ulcer of other part of right lower leg limited to breakdown of skin L97.821 Non-pressure chronic ulcer of other part of left lower leg limited to breakdown of skin Facility Procedures CPT4Tennis Must Code [id number] (b Description: scription Mo 625 BILATERAL: Application of multi-layer venous compression system; leg elow knee), including ankle and foot. Modifier Quantity: difier  Quantity 1 Physician Procedures CPT4: Code [id number] Description: 214 - WC PHYS LEVEL 4 - EST PT ICD-10 Diagnosis Description I87.333 Chronic venous hypertension (idiopathic) with ulcer and i lower extremity L97.811 Non-pressure chronic ulcer of other part of right lower l skin L97.821 Non-pressure chronic  ulcer of other part of left lower le skin Modifier: nflammation of b eg limited to br g limited to bre Quantity: 1 ilateral eakdown of akdown of [employer]) Signed: 03/21/2019 6:48:11 PM By: Worthy Keeler PA-C Entered By: Worthy Keeler on 03/21/2019 14:16:45

## 2019-03-27 ENCOUNTER — Telehealth: Payer: Self-pay | Admitting: Internal Medicine

## 2019-03-27 NOTE — Telephone Encounter (Signed)
LMTCB

## 2019-03-28 ENCOUNTER — Encounter (HOSPITAL_BASED_OUTPATIENT_CLINIC_OR_DEPARTMENT_OTHER): Payer: Medicare Other | Admitting: Physician Assistant

## 2019-03-28 ENCOUNTER — Other Ambulatory Visit: Payer: Self-pay

## 2019-03-28 DIAGNOSIS — I87333 Chronic venous hypertension (idiopathic) with ulcer and inflammation of bilateral lower extremity: Secondary | ICD-10-CM | POA: Diagnosis not present

## 2019-03-28 NOTE — Progress Notes (Signed)
RAKIN, LEMELLE ([id number]) Visit Report for 03/28/2019 Chief Complaint Document Details Patient Name: Date of Service: Omar Wagner, Omar Wagner 03/28/2019 1:00 PM Medical Record [id number] Patient Account Number: [id number] Date of Birth/Sex: Treating RN: 10/29/1955 (63 y.o. Omar Wagner Primary Care Provider: Jack Wagner Other Clinician: Referring Provider: Treating Provider/Extender:Omar Wagner: 4 Information Obtained from: Patient Chief Complaint 11/10/2018; patient is here for review of wounds on his bilateral lower extremities Electronic Signature(s) Signed: 03/28/2019 5:02:14 PM By: Worthy Keeler PA-C Entered By: Worthy Wagner on 03/28/2019 13:16:48 -------------------------------------------------------------------------------- HPI Details Patient Name: Date of Service: Omar Wagner. 03/28/2019 1:00 PM Medical Record [id number] Patient Account Number: [id number] Date of Birth/Sex: Treating RN: 02-08-1956 (63 y.o. Omar Wagner Primary Care Provider: Jack Wagner Other Clinician: Referring Provider: Treating Provider/Extender:Omar Wagner: 4 History of Present Illness HPI Description: ADMISSION 11/10/2018 This is a 63 year old man who is not a diabetic. Primary chronic diseases golds 3 COPD. He tells me that he has had swelling in his legs for the last 3 years although I note that he had a DVT rule out study in epic in 2014 in any case he started to develop erythema and skin loss in January of this year. He tells me he had some difficulty getting into our clinic, I really do not know what the issue is. He was in the ER on 10/27/2018 noted that he had bilateral leg swelling and weeping. He was diagnosed with lymphedema and referred here. He may have had more recent venous ultrasounds through his primary care but I do not see that in our records. He has had compression stockings  ordered in the past but has not picked these up. He is simply been putting drying pads on his legs. He cannot wear long pants because of the weeping edema fluid. Past medical history; Gold 3 COPD, cataracts, PSVT, history of squamous cell CA of the skin, seborrheic keratosis, history of basal cell CA the skin, 2016 admit with cellulitis and abscess of the right lower extremity. Substance abuse He has not had arterial studies and because of the degree of skin loss in his lower extremities we were not able to do ABIs today 7/6- Patient is back after his first week with 3layer compression, swelling is a bit better, has been dressed with silver alginate 7/14; I think things are a little better he still has far too much edema in his lower extremities chronic venous insufficiency and lymphedema. Large areas bilaterally 7/20; he continues to have very significant improvement in his lower extremity edema which is likely secondary to chronic venous insufficiency and secondary lymphedema. He still has a large area on the left lateral calf and a smaller area on the right lateral calf. He also has systemic fluid volume overload a lot of edema in his thighs also his hands. This was present on admission so I do not think all of this is pushing fluid from his lower legs up into his system 7/28; he continues to have good improvement. He only has a small area left on the right lateral calf. Somewhat more substantial area on the left anterior tibia. Mostly everything looks better here 8/4-Patient returns at 1 week, the area on the right lateral calf seems to have healed, left anterior tibial wound slightly better, left lateral calf 2 small areas appear to be better. We are using Prisma on these wounds under 4 layer compression 8/11-Patient returns at 1  week with small new area on the right lower leg where he says he picked at a loose piece of skin, this does not seem to be much more than small abrasion His left  anterior leg wound appears to be about the same where using Silver alginate with 3 layer compression 01/03/2019 on evaluation today patient actually appears to be doing quite well with regard to his bilateral lower extremities. In fact his left lower extremity is the only area where I see an open wound still remaining the right seems to be completely healed. We have been using the silver alginate dressing with a 3 layer compression. 02/01/2019. The patient's wounds on his bilateral lower extremities are healed. He has dry flaking chronic eczematous skin. Thick patches and scales on predominantly the left leg left heel greater than right. He has been resistant to getting compression stockings although we have emphasized this needs to be done to maintain his skin integrity. He has not been seen by a doctor here in less than a month but he has been in for nurse visits 02/27/2019; patient returns to clinic with substantial bilateral circumferential epithelial loss. He tells me this happened directly after he left the clinic. He states he did get stockings from elastic therapy. He has not been systemically unwell 10/20; the patient was readmitted to the clinic last week. He had bilateral circumferential epithelial loss secondary to severe edema and marked stasis dermatitis right greater than left. I did not give him antibiotics. We put him back in 4 layer compression with TCA. We had him back for a nurse visit. He comes back in clinic and both legs look a lot better. He still has opened areas with loss of surface epithelium 03/21/2019 on evaluation today patient actually appears to be doing fairly well with regard to his Wounds on the bilateral lower extremities. He mainly has a couple areas open on the right lower extremity 1 on the toe wound and the leg. He really does not have anything open significantly on the left although he did pick it a couple of the dry skin areas which made them bleed just a  little bit but again these do not really wounds. Fortunately there is no evidence of active infection at this time. No fevers, chills, nausea, vomiting, or diarrhea. 03/28/2019 on evaluation today patient appears to actually be doing quite well with regard to his bilateral lower extremities. He really does not have anything open at this time and seems to be doing quite well. He was supposed to have ordered his compression stockings last week he tells me he actually did it this Monday however. He has not received this from [facility] yet. Hopefully he will get them soon however. Electronic Signature(s) Signed: 03/28/2019 5:02:14 PM By: Worthy Keeler PA-C Entered By: Worthy Wagner on 03/28/2019 13:25:19 -------------------------------------------------------------------------------- Physical Exam Details Patient Name: Date of Service: CUTLER, SUNDAY 03/28/2019 1:00 PM Medical Record [id number] Patient Account Number: [id number] Date of Birth/Sex: Treating RN: November 15, 1955 (63 y.o. Omar Wagner Primary Care Provider: Jack Wagner Other Clinician: Referring Provider: Treating Provider/Extender:Omar Wagner: 4 Constitutional Well-nourished and well-hydrated in no acute distress. Respiratory normal breathing without difficulty. clear to auscultation bilaterally. Cardiovascular regular rate and rhythm with normal S1, S2. Psychiatric this patient is able to make decisions and demonstrates good insight into disease process. Alert and Oriented x 3. pleasant and cooperative. Notes Upon inspection today patient's wound bed again on the bilateral lower extremities  appears to be doing okay there is no signs of open or active infection at this time and there is no evidence of open wounds either. Everything appears to have healed quite nicely his feet do appear to be somewhat wet I am not sure if this is just due to him sweating or if he actually got  them wet. My gut tells me he actually got them wet just based on the appearance today. With that being said I do think that he needs to use some powder on his feet really on a daily basis to keep things under control from moisture standpoint. I also believe that at this point until he gets his compression socks we would just use a Ace wrap for each leg to keep the edema under control. Electronic Signature(s) Signed: 03/28/2019 5:02:14 PM By: Worthy Keeler PA-C Entered By: Worthy Wagner on 03/28/2019 13:26:06 -------------------------------------------------------------------------------- Physician Orders Details Patient Name: Date of Service: Omar Wagner. 03/28/2019 1:00 PM Medical Record [id number] Patient Account Number: [id number] Date of Birth/Sex: Treating RN: 1955/12/29 (63 y.o. Omar Wagner Primary Care Provider: Jack Wagner Other Clinician: Referring Provider: Treating Provider/Extender:Omar Wagner: 4 Verbal / Phone Orders: No Diagnosis Coding ICD-10 Coding Code Description 5590304977 Chronic venous hypertension (idiopathic) with ulcer and inflammation of bilateral lower extremity L97.811 Non-pressure chronic ulcer of other part of right lower leg limited to breakdown of skin L97.821 Non-pressure chronic ulcer of other part of left lower leg limited to breakdown of skin Follow-up Appointments Return Appointment in 1 week. Dressing Change Frequency Do not change entire dressing for one week. - both legs Skin Barriers/Peri-Wound Care Antifungal powder - foot power to feet daily especially toes Moisturizing lotion - lotion to both legs Wound Cleansing May shower with protection. - both legs Edema Control 4 layer compression - Bilateral Avoid standing for long periods of time Elevate legs to the level of the heart or above for 30 minutes daily and/or when sitting, a frequency of: - throughout day. Exercise  regularly Support Garment 20-30 mm/Hg pressure to: - bring compression stockings to appointment next week Electronic Signature(s) Signed: 03/28/2019 5:02:14 PM By: Worthy Keeler PA-C Signed: 03/28/2019 5:49:26 PM By: Baruch Gouty RN, BSN Entered By: Baruch Gouty on 03/28/2019 13:31:08 -------------------------------------------------------------------------------- Problem List Details Patient Name: Date of Service: Omar Wagner. 03/28/2019 1:00 PM Medical Record [id number] Patient Account Number: [id number] Date of Birth/Sex: Treating RN: 29-Aug-1955 (63 y.o. Omar Wagner Primary Care Provider: Jack Wagner Other Clinician: Referring Provider: Treating Provider/Extender:Omar Wagner: 4 Active Problems ICD-10 Evaluated Encounter Code Description Active Date Today Diagnosis I87.333 Chronic venous hypertension (idiopathic) with ulcer 02/27/2019 No Yes and inflammation of bilateral lower extremity L97.811 Non-pressure chronic ulcer of other part of right lower 02/27/2019 No Yes leg limited to breakdown of skin L97.821 Non-pressure chronic ulcer of other part of left lower 02/27/2019 No Yes leg limited to breakdown of skin Inactive Problems Resolved Problems Electronic Signature(s) Signed: 03/28/2019 5:02:14 PM By: Worthy Keeler PA-C Entered By: Worthy Wagner on 03/28/2019 13:16:42 -------------------------------------------------------------------------------- Progress Note Details Patient Name: Date of Service: Omar Wagner. 03/28/2019 1:00 PM Medical Record [id number] Patient Account Number: [id number] Date of Birth/Sex: Treating RN: 1955/12/10 (63 y.o. Omar Wagner Primary Care Provider: Jack Wagner Other Clinician: Referring Provider: Treating Provider/Extender:Omar Wagner: 4 Subjective Chief Complaint Information obtained from Patient 11/10/2018; patient is  here for review of  wounds on his bilateral lower extremities History of Present Illness (HPI) ADMISSION 11/10/2018 This is a 63 year old man who is not a diabetic. Primary chronic diseases golds 3 COPD. He tells me that he has had swelling in his legs for the last 3 years although I note that he had a DVT rule out study in epic in 2014 in any case he started to develop erythema and skin loss in January of this year. He tells me he had some difficulty getting into our clinic, I really do not know what the issue is. He was in the ER on 10/27/2018 noted that he had bilateral leg swelling and weeping. He was diagnosed with lymphedema and referred here. He may have had more recent venous ultrasounds through his primary care but I do not see that in our records. He has had compression stockings ordered in the past but has not picked these up. He is simply been putting drying pads on his legs. He cannot wear long pants because of the weeping edema fluid. Past medical history; Gold 3 COPD, cataracts, PSVT, history of squamous cell CA of the skin, seborrheic keratosis, history of basal cell CA the skin, 2016 admit with cellulitis and abscess of the right lower extremity. Substance abuse He has not had arterial studies and because of the degree of skin loss in his lower extremities we were not able to do ABIs today 7/6- Patient is back after his first week with 3layer compression, swelling is a bit better, has been dressed with silver alginate 7/14; I think things are a little better he still has far too much edema in his lower extremities chronic venous insufficiency and lymphedema. Large areas bilaterally 7/20; he continues to have very significant improvement in his lower extremity edema which is likely secondary to chronic venous insufficiency and secondary lymphedema. He still has a large area on the left lateral calf and a smaller area on the right lateral calf. He also has systemic fluid volume  overload a lot of edema in his thighs also his hands. This was present on admission so I do not think all of this is pushing fluid from his lower legs up into his system 7/28; he continues to have good improvement. He only has a small area left on the right lateral calf. Somewhat more substantial area on the left anterior tibia. Mostly everything looks better here 8/4-Patient returns at 1 week, the area on the right lateral calf seems to have healed, left anterior tibial wound slightly better, left lateral calf 2 small areas appear to be better. We are using Prisma on these wounds under 4 layer compression 8/11-Patient returns at 1 week with small new area on the right lower leg where he says he picked at a loose piece of skin, this does not seem to be much more than small abrasion His left anterior leg wound appears to be about the same where using Silver alginate with 3 layer compression 01/03/2019 on evaluation today patient actually appears to be doing quite well with regard to his bilateral lower extremities. In fact his left lower extremity is the only area where I see an open wound still remaining the right seems to be completely healed. We have been using the silver alginate dressing with a 3 layer compression. 02/01/2019. The patient's wounds on his bilateral lower extremities are healed. He has dry flaking chronic eczematous skin. Thick patches and scales on predominantly the left leg left heel greater than right. He has been resistant  to getting compression stockings although we have emphasized this needs to be done to maintain his skin integrity. He has not been seen by a doctor here in less than a month but he has been in for nurse visits 02/27/2019; patient returns to clinic with substantial bilateral circumferential epithelial loss. He tells me this happened directly after he left the clinic. He states he did get stockings from elastic therapy. He has not been systemically  unwell 10/20; the patient was readmitted to the clinic last week. He had bilateral circumferential epithelial loss secondary to severe edema and marked stasis dermatitis right greater than left. I did not give him antibiotics. We put him back in 4 layer compression with TCA. We had him back for a nurse visit. He comes back in clinic and both legs look a lot better. He still has opened areas with loss of surface epithelium 03/21/2019 on evaluation today patient actually appears to be doing fairly well with regard to his Wounds on the bilateral lower extremities. He mainly has a couple areas open on the right lower extremity 1 on the toe wound and the leg. He really does not have anything open significantly on the left although he did pick it a couple of the dry skin areas which made them bleed just a little bit but again these do not really wounds. Fortunately there is no evidence of active infection at this time. No fevers, chills, nausea, vomiting, or diarrhea. 03/28/2019 on evaluation today patient appears to actually be doing quite well with regard to his bilateral lower extremities. He really does not have anything open at this time and seems to be doing quite well. He was supposed to have ordered his compression stockings last week he tells me he actually did it this Monday however. He has not received this from [facility] yet. Hopefully he will get them soon however. Patient History Information obtained from Patient. Family History Cancer - Father,Mother,Siblings, No family history of Diabetes, Heart Disease, Hereditary Spherocytosis, Hypertension, Kidney Disease, Lung Disease, Seizures, Stroke, Thyroid Problems, Tuberculosis. Social History Current every day smoker, Marital Status - Widowed, Alcohol Use - Moderate, Drug Use - Prior History - pot , heorin. Medical History Eyes Denies history of Cataracts, Glaucoma, Optic Neuritis Ear/Nose/Mouth/Throat Denies history of Chronic sinus  problems/congestion, Middle ear problems Hematologic/Lymphatic Patient has history of Lymphedema Denies history of Anemia, Hemophilia, Human Immunodeficiency Virus, Sickle Cell Disease Respiratory Patient has history of Chronic Obstructive Pulmonary Disease (COPD) Denies history of Aspiration, Asthma, Pneumothorax, Sleep Apnea, Tuberculosis Cardiovascular Denies history of Angina, Arrhythmia, Congestive Heart Failure, Coronary Artery Disease, Deep Vein Thrombosis, Hypertension, Hypotension, Myocardial Infarction, Peripheral Arterial Disease, Peripheral Venous Disease, Phlebitis, Vasculitis Gastrointestinal Denies history of Cirrhosis , Colitis, Crohnoos, Hepatitis A, Hepatitis B, Hepatitis C Endocrine Denies history of Type I Diabetes, Type II Diabetes Genitourinary Denies history of End Stage Renal Disease Immunological Denies history of Lupus Erythematosus, Raynaudoos, Scleroderma Integumentary (Skin) Denies history of History of Burn Musculoskeletal Denies history of Gout, Rheumatoid Arthritis, Osteoarthritis, Osteomyelitis Neurologic Denies history of Dementia, Neuropathy, Quadriplegia, Paraplegia, Seizure Disorder Oncologic Denies history of Received Chemotherapy, Received Radiation Psychiatric Denies history of Anorexia/bulimia, Confinement Anxiety Review of Systems (ROS) Constitutional Symptoms (General Health) Denies complaints or symptoms of Fatigue, Fever, Chills, Marked Weight Change. Respiratory Denies complaints or symptoms of Chronic or frequent coughs, Shortness of Breath. Cardiovascular Denies complaints or symptoms of Chest pain. Psychiatric Denies complaints or symptoms of Claustrophobia, Suicidal. Objective Constitutional Well-nourished and well-hydrated in no acute distress. Vitals Time  Taken: 1:05 PM, Height: 72 in, Weight: 185 lbs, BMI: 25.1, Temperature: 97.7 F, Pulse: 117 bpm, Respiratory Rate: 20 breaths/min, Blood Pressure: 116/71  mmHg. Respiratory normal breathing without difficulty. clear to auscultation bilaterally. Cardiovascular regular rate and rhythm with normal S1, S2. Psychiatric this patient is able to make decisions and demonstrates good insight into disease process. Alert and Oriented x 3. pleasant and cooperative. General Notes: Upon inspection today patient's wound bed again on the bilateral lower extremities appears to be doing okay there is no signs of open or active infection at this time and there is no evidence of open wounds either. Everything appears to have healed quite nicely his feet do appear to be somewhat wet I am not sure if this is just due to him sweating or if he actually got them wet. My gut tells me he actually got them wet just based on the appearance today. With that being said I do think that he needs to use some powder on his feet really on a daily basis to keep things under control from moisture standpoint. I also believe that at this point until he gets his compression socks we would just use a Ace wrap for each leg to keep the edema under control. Integumentary (Hair, Skin) Wound #4 status is Open. Original cause of wound was Gradually Appeared. The wound is located on the Right,Posterior Lower Leg. The wound measures 0cm length x 0cm width x 0cm depth; 0cm^2 area and 0cm^3 volume. Wound #6 status is Open. Original cause of wound was Gradually Appeared. The wound is located on the Right Toe Great. The wound measures 0cm length x 0cm width x 0cm depth; 0cm^2 area and 0cm^3 volume. 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 Post  procedure Diagnosis Wound #: Same as Pre-Procedure There was a Four Layer Compression Therapy Procedure by [name], RN. Post procedure Diagnosis Wound #: Same as Pre-Procedure Plan Follow-up Appointments: Return Appointment in 1 week. Dressing Change Frequency: Do not change entire dressing for one week. - both legs Skin Barriers/Peri-Wound Care: Antifungal powder - foot power to feet daily especially toes Moisturizing lotion - lotion to both legs Wound Cleansing: May shower with protection. - both legs Edema Control: 4 layer compression - Bilateral Avoid standing for long periods of time Elevate legs to the level of the heart or above for 30 minutes daily and/or when sitting, a frequency of: - throughout day. Exercise regularly Support Garment 20-30 mm/Hg pressure to: - bring compression stockings to appointment next week 1. At this time patient appears to be healed but since he does not have his compression stockings we will get a monitor him 1 more week before discharge. Hopefully he should have those by next week but either way I explained that we would be looking at discharge next week. 2. We will continue with a 4 layer compression wrap at this point. 3. Based on the fact that his feet are somewhat wet and on the bottom appear somewhat macerated I am going to use antifungal foot powder to try to help in this regard. 4. I do recommend he continue to elevate his legs much as possible. We will see patient back for  reevaluation in 1 week here in the clinic. If anything worsens or changes patient will contact our office for additional recommendations. Electronic Signature(s) Signed: 03/28/2019 5:02:14 PM By: Worthy Keeler PA-C Entered By: Worthy Wagner on 03/28/2019 13:34:47 -------------------------------------------------------------------------------- HxROS Details Patient Name: Date of Service: Omar Wagner. 03/28/2019 1:00 PM Medical Record [id number]  Patient Account Number: [id number] Date of Birth/Sex: Treating RN: June 26, 1955 (63 y.o. Omar Wagner Primary Care Provider: Jack Wagner Other Clinician: Referring Provider: Treating Provider/Extender:Omar Wagner: 4 Information Obtained From Patient Constitutional Symptoms (General Health) Complaints and Symptoms: Negative for: Fatigue; Fever; Chills; Marked Weight Change Respiratory Complaints and Symptoms: Negative for: Chronic or frequent coughs; Shortness of Breath Medical History: Positive for: Chronic Obstructive Pulmonary Disease (COPD) Negative for: Aspiration; Asthma; Pneumothorax; Sleep Apnea; Tuberculosis Cardiovascular Complaints and Symptoms: Negative for: Chest pain Medical History: Negative for: Angina; Arrhythmia; Congestive Heart Failure; Coronary Artery Disease; Deep Vein Thrombosis; Hypertension; Hypotension; Myocardial Infarction; Peripheral Arterial Disease; Peripheral Venous Disease; Phlebitis; Vasculitis Psychiatric Complaints and Symptoms: Negative for: Claustrophobia; Suicidal Medical History: Negative for: Anorexia/bulimia; Confinement Anxiety Eyes Medical History: Negative for: Cataracts; Glaucoma; Optic Neuritis Ear/Nose/Mouth/Throat Medical History: Negative for: Chronic sinus problems/congestion; Middle ear problems Hematologic/Lymphatic Medical History: Positive for: Lymphedema Negative for: Anemia; Hemophilia; Human Immunodeficiency Virus; Sickle Cell Disease Gastrointestinal Medical History: Negative for: Cirrhosis ; Colitis; Crohns; Hepatitis A; Hepatitis B; Hepatitis C Endocrine Medical History: Negative for: Type I Diabetes; Type II Diabetes Genitourinary Medical History: Negative for: End Stage Renal Disease Immunological Medical History: Negative for: Lupus Erythematosus; Raynauds; Scleroderma Integumentary (Skin) Medical History: Negative for: History of Burn Musculoskeletal Medical  History: Negative for: Gout; Rheumatoid Arthritis; Osteoarthritis; Osteomyelitis Neurologic Medical History: Negative for: Dementia; Neuropathy; Quadriplegia; Paraplegia; Seizure Disorder Oncologic Medical History: Negative for: Received Chemotherapy; Received Radiation Immunizations Pneumococcal Vaccine: Received Pneumococcal Vaccination: No Implantable Devices None Family and Social History Cancer: Yes - Father,Mother,Siblings; Diabetes: No; Heart Disease: No; Hereditary Spherocytosis: No; Hypertension: No; Kidney Disease: No; Lung Disease: No; Seizures: No; Stroke: No; Thyroid Problems: No; Tuberculosis: No; Current every day smoker; Marital Status - Widowed; Alcohol Use: Moderate; Drug Use: Prior History - pot , heorin; Financial Concerns: No; Food, Clothing or Shelter Needs: No; Support System Lacking: No Physician Affirmation I have reviewed and agree with the above information. Electronic Signature(s) Signed: 03/28/2019 5:02:14 PM By: Worthy Keeler PA-C Signed: 03/28/2019 5:49:26 PM By: Baruch Gouty RN, BSN Entered By: Worthy Wagner on 03/28/2019 13:25:34 -------------------------------------------------------------------------------- SuperBill Details Patient Name: Date of Service: Omar Wagner 03/28/2019 Medical Record [id number] Patient Account Number: [id number] Date of Birth/Sex: Treating RN: 1955/10/06 (63 y.o. Omar Wagner Primary Care Provider: Jack Wagner Other Clinician: Referring Provider: Treating Provider/Extender:Omar Wagner: 4 Diagnosis Coding ICD-10 Codes Code Description [phone] Chronic venous hypertension (idiopathic) with ulcer and inflammation of bilateral lower extremity L97.811 Non-pressure chronic ulcer of other part of right lower leg limited to breakdown of skin L97.821 Non-pressure chronic ulcer of other part of left lower leg limited to breakdown of skin Facility Procedures CPT4:  Code [id number] Description: 937 BILATERAL: Application of multi-layer venous compression system; leg (below knee), including ankle and foot. Modifier Quantity: 1 Physician Procedures CPT4: Code [id number] Description: 214 - WC PHYS LEVEL 4 - EST PT ICD-10 Diagnosis Description I87.333 Chronic venous hypertension (idiopathic) with ulcer and in lower extremity L97.811 Non-pressure chronic ulcer of other part of right lower le skin L97.821 Non-pressure  chronic ulcer of other part  of left lower leg skin Modifier: flammation of bil g limited to brea limited to break Quantity: 1 ateral kdown of down of Electronic Signature(s) Signed: 03/28/2019 5:02:14 PM By: Worthy Keeler PA-C Signed: 03/28/2019 5:49:26 PM By: Baruch Gouty RN, BSN Entered By: Baruch Gouty on 03/28/2019 13:32:11

## 2019-03-28 NOTE — Progress Notes (Addendum)
DUNCAN, ALEJANDRO ([id number]) Visit Report for 03/28/2019 Arrival Information Details Patient Name: Date of Service: KIAH, KEAY 03/28/2019 1:00 PM Medical Record [id number] Patient Account Number: [id number] Date of Birth/Sex: Treating RN: January 25, 1956 (63 y.o. Omar Wagner Primary Care Stacie Knutzen: Jack Quarto Other Clinician: Referring Reyah Streeter: Treating Brittane Grudzinski/Extender:Stone III, Bradd Burner, MARY Weeks in Treatment: 4 Visit Information History Since Last Visit Added or deleted any medications: No Patient Arrived: Ambulatory Any new allergies or adverse reactions: No Arrival Time: 13:05 Had a fall or experienced change in No Accompanied By: self activities of daily living that may affect Transfer Assistance: None risk of falls: Patient Identification Verified: Yes Signs or symptoms of abuse/neglect since last No Secondary Verification Process Yes visito Completed: Hospitalized since last visit: No Patient Requires Transmission-Based No Implantable device outside of the clinic excluding No Precautions: cellular tissue based products placed in the center Patient Has Alerts: No since last visit: Has Dressing in Place as Prescribed: Yes Has Compression in Place as Prescribed: Yes Pain Present Now: No Notes per patient ordered compression stockings. Waiting for stockings to come in. Electronic Signature(s) Signed: 03/28/2019 5:42:00 PM By: [name] Entered By: [name] on 03/28/2019 13:15:44 -------------------------------------------------------------------------------- Clinic Level of Care Assessment Details Patient Name: Date of Service: RAFE, MACKOWSKI 03/28/2019 1:00 PM Medical Record [id number] Patient Account Number: [id number] Date of Birth/Sex: Treating RN: 07-15-1955 (63 y.o. [name] Primary Care Brixton Franko: Jack Quarto Other Clinician: Referring Yakelin Grenier: Treating Cai Anfinson/Extender:Stone III, Bradd Burner, MARY Weeks  in Treatment: 4 Clinic Level of Care Assessment Items TOOL 4 Quantity Score []  - Use when only an EandM is performed on FOLLOW-UP visit 0 ASSESSMENTS - Nursing Assessment / Reassessment X - Reassessment of Co-morbidities (includes updates in patient status) 1 10 X - Reassessment of Adherence to Treatment Plan 1 5 ASSESSMENTS - Wound and Skin Assessment / Reassessment []  - Simple Wound Assessment / Reassessment - one wound 0 X - Complex Wound Assessment / Reassessment - multiple wounds 2 5 []  - Dermatologic / Skin Assessment (not related to wound area) 0 ASSESSMENTS - Focused Assessment X - Circumferential Edema Measurements - multi extremities 2 5 []  - Nutritional Assessment / Counseling / Intervention 0 X - Lower Extremity Assessment (monofilament, tuning fork, pulses) 1 5 []  - Peripheral Arterial Disease Assessment (using hand held doppler) 0 ASSESSMENTS - Ostomy and/or Continence Assessment and Care []  - Incontinence Assessment and Management 0 []  - Ostomy Care Assessment and Management (repouching, etc.) 0 PROCESS - Coordination of Care X - Simple Patient / Family Education for ongoing care 1 15 []  - Complex (extensive) Patient / Family Education for ongoing care 0 X - Staff obtains Programmer, systems, Records, Test Results / Process Orders 1 10 []  - Staff telephones HHA, Nursing Homes / Clarify orders / etc 0 []  - Routine Transfer to another Facility (non-emergent condition) 0 []  - Routine Hospital Admission (non-emergent condition) 0 []  - New Admissions / Biomedical engineer / Ordering NPWT, Apligraf, etc. 0 []  - Emergency Hospital Admission (emergent condition) 0 X - Simple Discharge Coordination 1 10 []  - Complex (extensive) Discharge Coordination 0 PROCESS - Special Needs []  - Pediatric / Minor Patient Management 0 []  - Isolation Patient Management 0 []  - Hearing / Language / Visual special needs 0 []  - Assessment of Community assistance (transportation, D/C planning, etc.)  0 []  - Additional assistance / Altered mentation 0 []  - Support Surface(s) Assessment (bed, cushion, seat, etc.) 0 INTERVENTIONS - Wound Cleansing / Measurement []  - Simple  Wound Cleansing - one wound 0 X - Complex Wound Cleansing - multiple wounds 2 5 X - Wound Imaging (photographs - any number of wounds) 1 5 []  - Wound Tracing (instead of photographs) 0 []  - Simple Wound Measurement - one wound 0 X - Complex Wound Measurement - multiple wounds 2 5 INTERVENTIONS - Wound Dressings []  - Small Wound Dressing one or multiple wounds 0 []  - Medium Wound Dressing one or multiple wounds 0 []  - Large Wound Dressing one or multiple wounds 0 []  - Application of Medications - topical 0 []  - Application of Medications - injection 0 INTERVENTIONS - Miscellaneous []  - External ear exam 0 []  - Specimen Collection (cultures, biopsies, blood, body fluids, etc.) 0 []  - Specimen(s) / Culture(s) sent or taken to Lab for analysis 0 []  - Patient Transfer (multiple staff / Civil Service fast streamer / Similar devices) 0 []  - Simple Staple / Suture removal (25 or less) 0 []  - Complex Staple / Suture removal (26 or more) 0 []  - Hypo / Hyperglycemic Management (close monitor of Blood Glucose) 0 []  - Ankle / Brachial Index (ABI) - do not check if billed separately 0 X - Vital Signs 1 5 Has the patient been seen at the hospital within the last three years: Yes Total Score: 105 Level Of Care: New/Established - Level 3 Electronic Signature(s) Signed: 03/28/2019 5:49:26 PM By: Baruch Gouty RN, BSN Entered By: Baruch Gouty on 03/28/2019 13:27:36 -------------------------------------------------------------------------------- Compression Therapy Details Patient Name: Date of Service: [name]. 03/28/2019 1:00 PM Medical Record [id number] Patient Account Number: [id number] Date of Birth/Sex: Treating RN: Jun 28, 1955 (63 y.o. [name] Primary Care Mela Perham: Jack Quarto Other Clinician: Referring  Sherria Riemann: Treating Charrie Mcconnon/Extender:Stone III, Bradd Burner, MARY Weeks in Treatment: 4 Compression Therapy Performed for Wound NonWound Condition Lymphedema - Right Leg Assessment: Performed By: Clinician [name], RN Compression Type: Four Layer Post Procedure Diagnosis Same as Pre-procedure Electronic Signature(s) Signed: 03/28/2019 5:49:26 PM By: Baruch Gouty RN, BSN Entered By: Baruch Gouty on 03/28/2019 13:31:33 -------------------------------------------------------------------------------- Compression Therapy Details Patient Name: Date of Service: [name]. 03/28/2019 1:00 PM Medical Record [id number] Patient Account Number: [id number] Date of Birth/Sex: Treating RN: 22-Feb-1956 (63 y.o. [name] Primary Care Abby Stines: Jack Quarto Other Clinician: Referring Jisella Ashenfelter: Treating Trevon Strothers/Extender:Stone III, Bradd Burner, MARY Weeks in Treatment: 4 Compression Therapy Performed for Wound NonWound Condition Lymphedema - Left Leg Assessment: Performed By: Clinician [name], RN Compression Type: Four Layer Post Procedure Diagnosis Same as Pre-procedure Electronic Signature(s) Signed: 03/28/2019 5:49:26 PM By: Baruch Gouty RN, BSN Entered By: Baruch Gouty on 03/28/2019 13:31:51 -------------------------------------------------------------------------------- Encounter Discharge Information Details Patient Name: Date of Service: [name]. 03/28/2019 1:00 PM Medical Record [id number] Patient Account Number: [id number] Date of Birth/Sex: Treating RN: 1956/01/06 (63 y.o. Omar Wagner Primary Care Lamari Beckles: Jack Quarto Other Clinician: Referring Andreyah Natividad: Treating Laconya Clere/Extender:Stone III, Bradd Burner, MARY Weeks in Treatment: 4 Encounter Discharge Information Items Discharge Condition: Stable Ambulatory Status: Ambulatory Discharge Destination: Home Transportation: Private Auto Accompanied By: self Schedule  Follow-up Appointment: Yes Clinical Summary of Care: Electronic Signature(s) Signed: 03/28/2019 5:42:00 PM By: [name] Entered By: [name] on 03/28/2019 13:53:52 -------------------------------------------------------------------------------- Lower Extremity Assessment Details Patient Name: Date of Service: SHI, BLANKENSHIP. 03/28/2019 1:00 PM Medical Record [id number] Patient Account Number: [id number] Date of Birth/Sex: Treating RN: Apr 28, 1956 (63 y.o. Omar Wagner Primary Care Clark Clowdus: Jack Quarto Other Clinician: Referring Alinda Egolf: Treating Riaz Onorato/Extender:Stone III, Bradd Burner, MARY Weeks in Treatment: 4 Edema Assessment Assessed: [Left: Yes] [  Right: Yes] Edema: [Left: Yes] [Right: Yes] Calf Left: Right: Point of Measurement: 39 cm From Medial Instep 31 cm 33 cm Ankle Left: Right: Point of Measurement: 10 cm From Medial Instep 21 cm 23 cm Electronic Signature(s) Signed: 03/28/2019 5:42:00 PM By: [name] Entered By: [name] on 03/28/2019 13:16:42 -------------------------------------------------------------------------------- Multi-Disciplinary Care Plan Details Patient Name: Date of Service: [name]. 03/28/2019 1:00 PM Medical Record [id number] Patient Account Number: [id number] Date of Birth/Sex: Treating RN: May 25, 1955 (63 y.o. [name] Primary Care Woodward Klem: Jack Quarto Other Clinician: Referring Adali Pennings: Treating Shardee Dieu/Extender:Stone III, Bradd Burner, MARY Weeks in Treatment: 4 Active Inactive Electronic Signature(s) Signed: 03/28/2019 5:49:26 PM By: Baruch Gouty RN, BSN Entered By: Baruch Gouty on 03/28/2019 13:22:03 -------------------------------------------------------------------------------- Pain Assessment Details Patient Name: Date of Service: [name]. 03/28/2019 1:00 PM Medical Record [id number] Patient Account Number: [id number] Date of Birth/Sex: Treating  RN: 28-Oct-1955 (63 y.o. Omar Wagner Primary Care Sadeel Fiddler: Jack Quarto Other Clinician: Referring Anavictoria Wilk: Treating Chloe Baig/Extender:Stone III, Bradd Burner, MARY Weeks in Treatment: 4 Active Problems Location of Pain Severity and Description of Pain Patient Has Paino No Site Locations Rate the pain. Current Pain Level: 0 Pain Management and Medication Current Pain Management: Medication: No Cold Application: No Rest: No Massage: No Activity: No T.E.N.S.: No Heat Application: No Leg drop or elevation: No Is the Current Pain Management Adequate: Adequate How does your wound impact your activities of daily livingo Sleep: No Bathing: No Appetite: No Relationship With Others: No Bladder Continence: No Emotions: No Bowel Continence: No Work: No Toileting: No Drive: No Dressing: No Hobbies: No Notes per patient no pain just itches at times. Electronic Signature(s) Signed: 03/28/2019 5:42:00 PM By: [name] Entered By: [name] on 03/28/2019 13:16:26 -------------------------------------------------------------------------------- Patient/Caregiver Education Details Patient Name: [name] 11/11/2020andnbsp1:00 Date of Service: PM Medical Record [id number] Number: Patient Account Number: [id number] Treating RN: [birth year] (62 y.o. Baruch Gouty Date of Birth/Gender: M) Other Clinician: Primary Care Physician: Jack Quarto Treating Worthy Keeler Referring Physician: Physician/Extender: Conception Oms in Treatment: 4 Education Assessment Education Provided To: Patient Education Topics Provided Venous: Methods: Explain/Verbal Responses: Reinforcements needed, State content correctly Wound/Skin Impairment: Methods: Explain/Verbal Responses: Reinforcements needed, State content correctly Electronic Signature(s) Signed: 03/28/2019 5:49:26 PM By: Baruch Gouty RN, BSN Entered By: Baruch Gouty on 03/28/2019  13:22:28 -------------------------------------------------------------------------------- Wound Assessment Details Patient Name: Date of Service: [name]. 03/28/2019 1:00 PM Medical Record [id number] Patient Account Number: [id number] Date of Birth/Sex: Treating RN: [birth year] (63 y.o. Omar Wagner Primary Care Sharon Rubis: Jack Quarto Other Clinician: Referring Tylyn Stankovich: Treating Brendi Mccarroll/Extender:Stone III, Bradd Burner, MARY Weeks in Treatment: 4 Wound Status Wound Number: 4 Primary Venous Leg Ulcer Etiology: Wound Location: Right Lower Leg - Posterior Wound Healed - Epithelialized Wounding Event: Gradually Appeared Status: Date Acquired: 02/27/2019 Comorbid Lymphedema, Chronic Obstructive Weeks Of Treatment: 4 History: Pulmonary Disease (COPD) Clustered Wound: No Photos Wound Measurements Length: (cm) 0 % Reduct Width: (cm) 0 % Reduct Depth: (cm) 0 Epitheli Area: (cm) 0 Volume: (cm) 0 Wound Description Classification: Full Thickness Without Exposed Support Foul Od Structures Slough/ Wound Distinct, outline attached Margin: Exudate Medium Amount: Exudate Serosanguineous Type: Exudate red, brown Color: Wound Bed Granulation Amount: Large (67-100%) Granulation Quality: Pink Fascia Necrotic Amount: Small (1-33%) Fat Lay Necrotic Quality: Adherent Slough Tendon Muscle Joint E Bone Ex or After Cleansing: No Fibrino Yes Exposed Structure Exposed: No er (Subcutaneous Tissue) Exposed: Yes Exposed: No Exposed: No xposed: No posed: No ion in Area: 100% ion  in Volume: 100% alization: Large (67-100%) Electronic Signature(s) Signed: 03/30/2019 4:02:07 PM By: Mikeal Hawthorne EMT/HBOT Signed: 03/30/2019 5:02:36 PM By: [name] Previous Signature: 03/28/2019 5:42:00 PM Version By: [name] Entered By: Mikeal Hawthorne on 03/30/2019 11:46:42 -------------------------------------------------------------------------------- Wound Assessment  Details Patient Name: Date of Service: [name]. 03/28/2019 1:00 PM Medical Record [id number] Patient Account Number: [id number] Date of Birth/Sex: Treating RN: 1955-10-10 (63 y.o. Omar Wagner Primary Care Gayle Martinez: Jack Quarto Other Clinician: Referring Latiffany Harwick: Treating Keeshia Sanderlin/Extender:Stone III, Bradd Burner, MARY Weeks in Treatment: 4 Wound Status Wound Number: 6 Primary Abrasion Etiology: Wound Location: Right Toe Great Wound Healed - Epithelialized Wounding Event: Gradually Appeared Status: Date Acquired: 03/21/2019 Comorbid Lymphedema, Chronic Obstructive Weeks Of Treatment: 1 History: Pulmonary Disease (COPD) Clustered Wound: No Photos Wound Measurements Length: (cm) 0 % Reduct Width: (cm) 0 % Reduct Depth: (cm) 0 Epitheli Area: (cm) 0 Volume: (cm) 0 Wound Description Full Thickness Without Exposed Support Foul Od Classification: Structures Slough/ Wound Distinct, outline attached Margin: Exudate Medium Amount: Exudate Serosanguineous Type: Exudate red, brown Color: Wound Bed Granulation Amount: Large (67-100%) Granulation Quality: Red, Pink Fascia Exp Necrotic Amount: None Present (0%) Fat Layer Tendon Exp Muscle Exp Joint Expo Bone Expos or After Cleansing: No Fibrino No Exposed Structure osed: No (Subcutaneous Tissue) Exposed: No osed: No osed: No sed: No ed: No ion in Area: 100% ion in Volume: 100% alization: Small (1-33%) Electronic Signature(s) Signed: 03/30/2019 4:02:07 PM By: Mikeal Hawthorne EMT/HBOT Signed: 03/30/2019 5:02:36 PM By: [name] Previous Signature: 03/28/2019 5:42:00 PM Version By: [name] Entered By: Mikeal Hawthorne on 03/30/2019 11:46:18 -------------------------------------------------------------------------------- Vitals Details Patient Name: Date of Service: [name]. 03/28/2019 1:00 PM Medical Record [id number] Patient Account Number: [id number] Date of  Birth/Sex: Treating RN: 07/15/1955 (63 y.o. Omar Wagner Primary Care Vitali Seibert: Jack Quarto Other Clinician: Referring Jordin Dambrosio: Treating Karlita Lichtman/Extender:Stone III, Bradd Burner, MARY Weeks in Treatment: 4 Vital Signs Time Taken: 13:05 Temperature (F): 97.7 Height (in): 72 Pulse (bpm): 117 Weight (lbs): 185 Respiratory Rate (breaths/min): 20 Body Mass Index (BMI): 25.1 Blood Pressure (mmHg): 116/71 Reference Range: 80 - 120 mg / dl Electronic Signature(s) Signed: 03/28/2019 5:42:00 PM By: [name] Entered By: [name] on 03/28/2019 13:16:04

## 2019-03-28 NOTE — Telephone Encounter (Signed)
LMTCB x2 for pt 

## 2019-03-28 NOTE — Telephone Encounter (Signed)
[phone] pt calling back

## 2019-03-28 NOTE — Telephone Encounter (Signed)
Spoke with the pt  He states that he received denial letter from [place] regarding pt assistance for trelegy  He states would like to be prescribed another inhaler  He also states still has not been scheduled for the ct biopsy and wants to set this up but still has additional questions and would like to talk with Dr Melvyn Novas again  Note that I have already ordered the ct biopsy  Thanks

## 2019-03-29 ENCOUNTER — Encounter: Payer: Self-pay | Admitting: Internal Medicine

## 2019-03-29 ENCOUNTER — Ambulatory Visit (INDEPENDENT_AMBULATORY_CARE_PROVIDER_SITE_OTHER): Payer: Medicare Other | Admitting: Internal Medicine

## 2019-03-29 DIAGNOSIS — J449 Chronic obstructive pulmonary disease, unspecified: Secondary | ICD-10-CM

## 2019-03-29 MED ORDER — PREDNISONE 10 MG PO TABS: ORAL_TABLET | ORAL | 0 refills | Status: DC

## 2019-03-29 NOTE — Patient Instructions (Addendum)
Go ahead and schedule the CT biopsy after noon any weekday first available.  Schedule a visit as soon as possible  with all your medications and your drug formulary which gives you the best option to pick a new PLAN A you can afford.  In the meantime     Plan B = Backup   Only use your albuterol inhaler as a rescue medication to be used if you can't catch your breath by resting or doing a relaxed purse lip breathing pattern.  - The less you use it, the better it will work when you need it. - Ok to use the inhaler up to 2 puffs  every 4 hours if you must but call for appointment if use goes up over your usual need - Don't leave home without it !!  (think of it like the spare tire for your car)   Plan C = Crisis (instead of Plan B but only if Plan B stops working) - only use your albuterol nebulizer if you first try Plan B and it fails to help > ok to use the nebulizer up to every 4 hours but if start needing it regularly call for immediate appointment  Plan D = Deltasone Go ahead and use this x 6 days if C not working to your satisfaction

## 2019-03-29 NOTE — Assessment & Plan Note (Signed)
Quit smoking 01/2019 - Spirometry 08/02/2016  FEV1 1.25 (33%)  Ratio 45  p advair am of study  - 08/02/2016    try bevespi x 4 week samples  - 09/03/2016  After extensive coaching HFA effectiveness =   90% > continue bevespi - 12/03/2016 changed to trelegy by [facility] > pulmonary f/u prn  - 05/08/2018  After extensive coaching inhaler device,  effectiveness =    75% (short Ti) try bevespi 2 bid x 4 week samples and return with formulary in 4 weeks  - 10/30/2018  After extensive coaching inhaler device,  effectiveness =    75% hfa/ 90% dpi   Advised:  formulary restrictions will be an ongoing challenge for the forseable future and I would be happy to pick an alternative if the pt will first  provide me a list of them -  pt  will need to return here for training for any new device that is required eg dpi vs hfa vs respimat.    In the meantime we can always provide samples so that the patient never runs out of any needed respiratory medications.   For now since no access to trelegy will go with hfa saba backed up with neb saba backed up with Prednisone - see avs for instructions unique to this ov    Each maintenance medication was reviewed in detail including most importantly the difference between maintenance and as needed and under what circumstances the prns are to be used.  Please see AVS for specific  Instructions which are unique to this visit and I personally typed out  which were reviewed in detail in writing with the patient and a copy provided via mail

## 2019-03-29 NOTE — Telephone Encounter (Signed)
Put him on for a phone visit for me for today - anytime

## 2019-03-29 NOTE — Telephone Encounter (Signed)
Added a 4pm slot to MW schedule -added the patient and contacted him to advise Dr. Melvyn Novas will be calling him - Magda Paganini to contact Dr. Melvyn Novas and advise that patient has been added and expecting his call -pr

## 2019-03-29 NOTE — Progress Notes (Signed)
Subjective:     Patient ID: Omar Wagner, male   DOB: 1955/10/13,    MRN: [id number]     Brief patient profile:  54 yowm recurrent smoker with onset of doe x 2010 with h/o freq bronchitis episodes prior to quit smoking which is some better but the breathing is not despite advair disc x 2016 and neb daily and saba hfa much less but referred to pulmonary clinic 08/02/2016 by EDP at [facility] p flare 02/23/16     History of Present Illness  .08/02/2016 1st [facility] office visit/ Omar Wagner  maint advair 250 /daily neb  Chief Complaint  Patient presents with  . Pulmonary Consult    Self referral for COPD. Pt wants to apply for disability for his COPD.  He c/o SOB for the past 3-4 years. He states breathing bothers him when walking up any sort of incline. He has occ cough and wheezing. He is using albuterol neb and albuterol inhaler once daily on average.   doe = MMRC1 = can walk nl pace, flat grade, can't hurry or go uphills or steps s sob  Also carrying lumber > 5 lb anywhere Onset was insidious x 10 y prior to [place]  And progressive with only one flare oin 02/2016 for which rx in ER (see notes) and none since quit smoking  rec Plan A = Automatic = Stop advair and start Bevespi Take 2 puffs first thing in am and then another 2 puffs about 12 hours later Plan B = Backup Only use your albuterol (Ventolin) as a rescue medication  Plan C = Crisis - only use your albuterol nebulizer if you first try Plan B and it fails to help > ok to use the nebulizer up to every 4 hours but if start needing it regularly call for immediate appointment     09/03/2016  f/u ov/Omar Wagner re:   GOLD III copd/ sp smoking cessation [id number]  Chief Complaint  Patient presents with  . Follow-up    Breathing is some better. No new co's today. He is using ventolin HFA 4 x per wk on average and has not needed to use.   copd  III on trelegy liked  bevespi better  Doe still = MMRC1  rec Plan A = Automatic = Stop advair and start  Bevespi Take 2 puffs first thing in am and then another 2 puffs about 12 hours later Plan B = Backup Only use your albuterol (Ventolin) as a rescue medication Plan C = Crisis - only use your albuterol nebulizer if you first try Plan B     12/03/2016  f/u ov/Omar Wagner re:  GOLD III/ still smoking on trelegy plus prn saba up to 3 x daily hfa only ,no neb  Chief Complaint  Patient presents with  . Follow-up    Breathing is overall doing well. He has noticed occ palpitations since started on Trelegy by his PCP approx 2 months ago. He uses his albuterol inhaler 3 x per wk on average and he has not needed neb.    doe still = MMRC1 = can walk nl pace, flat grade, can't hurry or go uphills or steps s sob   Some hoarseness from trelegy he didn't have on bevespi plus palp noted, better on lopressor tid rec Plan A = Automatic =  Trelegy one click each am then rinse and gargle Plan B = Backup Only use your albuterol (Ventolin) as a rescue medication Plan C = Crisis - only use your  albuterol nebulizer if you first try Plan B     05/08/2018  f/u ov/Omar Wagner re:  GOLD III still smoking - maint rx with trelegy Laurine Blazer - trelegy too expensive under his plan but did not provide list of alternatives  Chief Complaint  Patient presents with  . Follow-up    Breathing is doing well. He is using his albuterol inhaler 2-3 x per wk.   Dyspnea:  MMRC1 = can walk nl pace, flat grade, can't hurry or go uphills or steps s sob   Cough: rattling in am worse Sleeping: sits up due sob SABA use: a few times a week 02: none   rec Plan A = Automatic = Bevespi Take 2 puffs first thing in am and then another 2 puffs about 12 hours later.  Work on inhaler technique:   Plan B = Backup Only use your albuterol (ventolin)  inhaler as a rescue medication  Plan C = Crisis - only use your albuterol nebulizer if you first try Plan B Please schedule a follow up office visit in 4 weeks, sooner if needed  with all medications  /inhalers/ solutions in hand so we can verify exactly what you are taking. This includes all medications from all doctors and over the counters  And your formulary list for alternatives       10/30/2018  f/u ov/Omar Wagner re: GOLD III/ can't afford maint rx  Chief Complaint  Patient presents with  . Follow-up    Breathing is about the same.  He is using his albuterol inhaler 12 x per wk on average and neb 1-2 x per wk.   Dyspnea:  Riding around neighborhood x half an hour on bike is his only activity but doe still = MMRC2 = can't walk a nl pace on a flat grade s sob but does fine slow and flat  Cough: none  Sleeping: at 45 degrees in recliner  SABA use: as above - way too much when off maint rx / trelegy has worked the best when he can afford it. 02: none  rec Plan A = Automatic = Trelegy one click each am - take two good drags off of one click  Plan B = Backup Only use your albuterol inhaler as a rescue medication Plan C = Crisis - only use your albuterol nebulizer if you first try Plan B  Please schedule a follow up visit in 3 months but call sooner if needed  with all medications /inhalers/ solutions in hand so we can verify exactly what you are taking. This includes all medications from all doctors and over the counters    Virtual Visit via Telephone Note 03/09/2019  History of Present Illness: GOLD III s/p smoking cessation 01/2019 Dyspnea:  Room to room Cough: better since quit smoking  Sleeping: in recliner 45 degrees  SABA use: much less while on trelegy 02: none   Observations/Objective: slt hoarse, able to speak full sentences  rec schedule fob > canceled due to aecopd   Virtual Visit via Telephone Note 03/29/2019   I connected with Omar Wagner on 03/29/19 at  4:00 PM EST by telephone and verified that I am speaking with the correct person using two identifiers.   I discussed the limitations, risks, security and privacy concerns of performing an evaluation and  management service by telephone and the availability of in person appointments. I also discussed with the patient that there may be a patient responsible charge related to this service. The patient expressed  understanding and agreed to proceed.   History of Present Illness: Dyspnea:  Off trelegy x 4 days- says just  Cough: minimal white mucus mostly daytime SABA use: up to 3 x daly if active, not needing at rest and not using neb as PLAN C  02: none   No obvious day to day or daytime variability or assoc excess/ purulent sputum or mucus plugs or hemoptysis or cp or chest tightness, subjective wheeze or overt sinus or hb symptoms.    Also denies any obvious fluctuation of symptoms with weather or environmental changes or other aggravating or alleviating factors except as outlined above.   Meds reviewed/ med reconciliation completed          Observations/Objective: slt hoarse but speaking full sentences, no distress   Assessment and Plan: See problem list for active a/p's   Follow Up Instructions: See avs for instructions unique to this ov which includes revised/ updated med list     I discussed the assessment and treatment plan with the patient. The patient was provided an opportunity to ask questions and all were answered. The patient agreed with the plan and demonstrated an understanding of the instructions.   The patient was advised to call back or seek an in-person evaluation if the symptoms worsen or if the condition fails to improve as anticipated.  I provided 25  minutes of non-face-to-face time during this encounter.   Christinia Gully, MD

## 2019-03-29 NOTE — Telephone Encounter (Signed)
Patrice can you help with this please? Dr Gustavus Bryant schedule is currently closed so I can not put anything on it, thanks!

## 2019-04-04 ENCOUNTER — Encounter (HOSPITAL_BASED_OUTPATIENT_CLINIC_OR_DEPARTMENT_OTHER): Payer: Medicare Other | Admitting: Physician Assistant

## 2019-04-04 ENCOUNTER — Other Ambulatory Visit: Payer: Self-pay

## 2019-04-04 DIAGNOSIS — I87333 Chronic venous hypertension (idiopathic) with ulcer and inflammation of bilateral lower extremity: Secondary | ICD-10-CM | POA: Diagnosis not present

## 2019-04-04 NOTE — Progress Notes (Signed)
Omar Wagner, Omar Wagner ([id number]) Visit Report for 04/04/2019 Chief Complaint Document Details Patient Name: Date of Service: Omar Wagner, Omar Wagner 04/04/2019 1:00 PM Medical Record [id number] Patient Account Number: [id number] Date of Birth/Sex: Treating RN: 16-Aug-1955 (63 y.o. Omar Wagner Primary Care Provider: Jack Quarto Other Clinician: Referring Provider: Treating Provider/Extender:Stone III, Bradd Burner, MARY Weeks in Treatment: 5 Information Obtained from: Patient Chief Complaint 11/10/2018; patient is here for review of wounds on his bilateral lower extremities Electronic Signature(s) Signed: 04/04/2019 5:11:42 PM By: Worthy Keeler PA-C Entered By: Worthy Keeler on 04/04/2019 13:44:36 -------------------------------------------------------------------------------- HPI Details Patient Name: Date of Service: Omar Wagner. 04/04/2019 1:00 PM Medical Record [id number] Patient Account Number: [id number] Date of Birth/Sex: Treating RN: [birth year] (63 y.o. Omar Wagner Primary Care Provider: Jack Quarto Other Clinician: Referring Provider: Treating Provider/Extender:Stone III, Bradd Burner, MARY Weeks in Treatment: 5 History of Present Illness HPI Description: ADMISSION 11/10/2018 This is a 63 year old man who is not a diabetic. Primary chronic diseases golds 3 COPD. He tells me that he has had swelling in his legs for the last 3 years although I note that he had a DVT rule out study in epic in 2014 in any case he started to develop erythema and skin loss in January of this year. He tells me he had some difficulty getting into our clinic, I really do not know what the issue is. He was in the ER on 10/27/2018 noted that he had bilateral leg swelling and weeping. He was diagnosed with lymphedema and referred here. He may have had more recent venous ultrasounds through his primary care but I do not see that in our records. He has had compression stockings  ordered in the past but has not picked these up. He is simply been putting drying pads on his legs. He cannot wear long pants because of the weeping edema fluid. Past medical history; Gold 3 COPD, cataracts, PSVT, history of squamous cell CA of the skin, seborrheic keratosis, history of basal cell CA the skin, 2016 admit with cellulitis and abscess of the right lower extremity. Substance abuse He has not had arterial studies and because of the degree of skin loss in his lower extremities we were not able to do ABIs today 7/6- Patient is back after his first week with 3layer compression, swelling is a bit better, has been dressed with silver alginate 7/14; I think things are a little better he still has far too much edema in his lower extremities chronic venous insufficiency and lymphedema. Large areas bilaterally 7/20; he continues to have very significant improvement in his lower extremity edema which is likely secondary to chronic venous insufficiency and secondary lymphedema. He still has a large area on the left lateral calf and a smaller area on the right lateral calf. He also has systemic fluid volume overload a lot of edema in his thighs also his hands. This was present on admission so I do not think all of this is pushing fluid from his lower legs up into his system 7/28; he continues to have good improvement. He only has a small area left on the right lateral calf. Somewhat more substantial area on the left anterior tibia. Mostly everything looks better here 8/4-Patient returns at 1 week, the area on the right lateral calf seems to have healed, left anterior tibial wound slightly better, left lateral calf 2 small areas appear to be better. We are using Prisma on these wounds under 4 layer compression 8/11-Patient returns at 1  week with small new area on the right lower leg where he says he picked at a loose piece of skin, this does not seem to be much more than small abrasion His left  anterior leg wound appears to be about the same where using Silver alginate with 3 layer compression 01/03/2019 on evaluation today patient actually appears to be doing quite well with regard to his bilateral lower extremities. In fact his left lower extremity is the only area where I see an open wound still remaining the right seems to be completely healed. We have been using the silver alginate dressing with a 3 layer compression. 02/01/2019. The patient's wounds on his bilateral lower extremities are healed. He has dry flaking chronic eczematous skin. Thick patches and scales on predominantly the left leg left heel greater than right. He has been resistant to getting compression stockings although we have emphasized this needs to be done to maintain his skin integrity. He has not been seen by a doctor here in less than a month but he has been in for nurse visits 02/27/2019; patient returns to clinic with substantial bilateral circumferential epithelial loss. He tells me this happened directly after he left the clinic. He states he did get stockings from [facility]. He has not been systemically unwell 10/20; the patient was readmitted to the clinic last week. He had bilateral circumferential epithelial loss secondary to severe edema and marked stasis dermatitis right greater than left. I did not give him antibiotics. We put him back in 4 layer compression with TCA. We had him back for a nurse visit. He comes back in clinic and both legs look a lot better. He still has opened areas with loss of surface epithelium 03/21/2019 on evaluation today patient actually appears to be doing fairly well with regard to his Wounds on the bilateral lower extremities. He mainly has a couple areas open on the right lower extremity 1 on the toe wound and the leg. He really does not have anything open significantly on the left although he did pick it a couple of the dry skin areas which made them bleed just a  little bit but again these do not really wounds. Fortunately there is no evidence of active infection at this time. No fevers, chills, nausea, vomiting, or diarrhea. 03/28/2019 on evaluation today patient appears to actually be doing quite well with regard to his bilateral lower extremities. He really does not have anything open at this time and seems to be doing quite well. He was supposed to have ordered his compression stockings last week he tells me he actually did it this Monday however. He has not received this from [facility] yet. Hopefully he will get them soon however. 04/04/19 upon evaluation today patient actually appears to be completely healed which is excellent news. He has been tolerating the compression wraps without complication and this has done an excellent job for him. Overall I am extremely happy with how things have gone. He has ordered his compression stockings I did confirm this with [facility] unfortunately they had some computer issues and ended up being delayed in getting these out to the patient. Nonetheless she tells me they were shipped on Monday and should be to him if not tomorrow by at least Friday. They could even show up today as they were in [place] as of 1:30 AM this morning. Electronic Signature(s) Signed: 04/04/2019 5:11:42 PM By: Worthy Keeler PA-C Entered By: Worthy Keeler on 04/04/2019 14:21:37 -------------------------------------------------------------------------------- Physical  Exam Details Patient Name: Date of Service: KAIRI, TUFO 04/04/2019 1:00 PM Medical Record [id number] Patient Account Number: [id number] Date of Birth/Sex: Treating RN: [birth year] (63 y.o. Omar Wagner Primary Care Provider: Jack Quarto Other Clinician: Referring Provider: Treating Provider/Extender:Stone III, Bradd Burner, MARY Weeks in Treatment: 5 Constitutional Well-nourished and well-hydrated in no acute distress. Respiratory normal  breathing without difficulty. Psychiatric this patient is able to make decisions and demonstrates good insight into disease process. Alert and Oriented x 3. pleasant and cooperative. Notes Upon inspection patient's wound bed again showed signs of complete epithelization there is no signs of open wounds on his lower extremities bilaterally overall he is doing quite well as far as I am concerned. Electronic Signature(s) Signed: 04/04/2019 5:11:42 PM By: Worthy Keeler PA-C Entered By: Worthy Keeler on 04/04/2019 14:22:36 -------------------------------------------------------------------------------- Physician Orders Details Patient Name: Date of Service: Omar Wagner. 04/04/2019 1:00 PM Medical Record [id number] Patient Account Number: [id number] Date of Birth/Sex: Treating RN: 1956-01-12 (63 y.o. Omar Wagner Primary Care Provider: Jack Quarto Other Clinician: Referring Provider: Treating Provider/Extender:Stone III, Bradd Burner, MARY Weeks in Treatment: 5 Verbal / Phone Orders: No Diagnosis Coding ICD-10 Coding Code Description [phone] Chronic venous hypertension (idiopathic) with ulcer and inflammation of bilateral lower extremity L97.811 Non-pressure chronic ulcer of other part of right lower leg limited to breakdown of skin L97.821 Non-pressure chronic ulcer of other part of left lower leg limited to breakdown of skin Discharge From [facility] Services Discharge from [facility] Skin Barriers/Peri-Wound Care Moisturizing lotion - both legs daily Wound Cleansing May shower and wash wound with soap and water. Edema Control Avoid standing for long periods of time Elevate legs to the level of the heart or above for 30 minutes daily and/or when sitting, a frequency of: - throughout day. Exercise regularly Support Garment 20-30 mm/Hg pressure to: - compression stockings to both legs daily Other: - ace wraps to both legs wrapped from toes to knee daily until  compression stockings available Electronic Signature(s) Signed: 04/04/2019 5:11:42 PM By: Worthy Keeler PA-C Signed: 04/04/2019 5:42:21 PM By: Baruch Gouty RN, BSN Entered By: Baruch Gouty on 04/04/2019 14:01:23 -------------------------------------------------------------------------------- Problem List Details Patient Name: Date of Service: Omar Wagner. 04/04/2019 1:00 PM Medical Record [id number] Patient Account Number: [id number] Date of Birth/Sex: Treating RN: [birth year] (63 y.o. Omar Wagner Primary Care Provider: Jack Quarto Other Clinician: Referring Provider: Treating Provider/Extender:Stone III, Bradd Burner, MARY Weeks in Treatment: 5 Active Problems ICD-10 Evaluated Encounter Code Description Active Date Today Diagnosis I87.333 Chronic venous hypertension (idiopathic) with ulcer 02/27/2019 No Yes and inflammation of bilateral lower extremity L97.811 Non-pressure chronic ulcer of other part of right lower 02/27/2019 No Yes leg limited to breakdown of skin L97.821 Non-pressure chronic ulcer of other part of left lower 02/27/2019 No Yes leg limited to breakdown of skin Inactive Problems Resolved Problems Electronic Signature(s) Signed: 04/04/2019 5:11:42 PM By: Worthy Keeler PA-C Entered By: Worthy Keeler on 04/04/2019 13:44:31 -------------------------------------------------------------------------------- Progress Note Details Patient Name: Date of Service: Omar Wagner. 04/04/2019 1:00 PM Medical Record [id number] Patient Account Number: [id number] Date of Birth/Sex: Treating RN: December 20, 1955 (63 y.o. Omar Wagner Primary Care Provider: Jack Quarto Other Clinician: Referring Provider: Treating Provider/Extender:Stone III, Bradd Burner, MARY Weeks in Treatment: 5 Subjective Chief Complaint Information obtained from Patient 11/10/2018; patient is here for review of wounds on his bilateral lower extremities History of  Present Illness (HPI) ADMISSION 11/10/2018 This is a 63 year old man who is not a  diabetic. Primary chronic diseases golds 3 COPD. He tells me that he has had swelling in his legs for the last 3 years although I note that he had a DVT rule out study in epic in 2014 in any case he started to develop erythema and skin loss in January of this year. He tells me he had some difficulty getting into our clinic, I really do not know what the issue is. He was in the ER on 10/27/2018 noted that he had bilateral leg swelling and weeping. He was diagnosed with lymphedema and referred here. He may have had more recent venous ultrasounds through his primary care but I do not see that in our records. He has had compression stockings ordered in the past but has not picked these up. He is simply been putting drying pads on his legs. He cannot wear long pants because of the weeping edema fluid. Past medical history; Gold 3 COPD, cataracts, PSVT, history of squamous cell CA of the skin, seborrheic keratosis, history of basal cell CA the skin, 2016 admit with cellulitis and abscess of the right lower extremity. Substance abuse He has not had arterial studies and because of the degree of skin loss in his lower extremities we were not able to do ABIs today 7/6- Patient is back after his first week with 3layer compression, swelling is a bit better, has been dressed with silver alginate 7/14; I think things are a little better he still has far too much edema in his lower extremities chronic venous insufficiency and lymphedema. Large areas bilaterally 7/20; he continues to have very significant improvement in his lower extremity edema which is likely secondary to chronic venous insufficiency and secondary lymphedema. He still has a large area on the left lateral calf and a smaller area on the right lateral calf. He also has systemic fluid volume overload a lot of edema in his thighs also his hands. This was present on  admission so I do not think all of this is pushing fluid from his lower legs up into his system 7/28; he continues to have good improvement. He only has a small area left on the right lateral calf. Somewhat more substantial area on the left anterior tibia. Mostly everything looks better here 8/4-Patient returns at 1 week, the area on the right lateral calf seems to have healed, left anterior tibial wound slightly better, left lateral calf 2 small areas appear to be better. We are using Prisma on these wounds under 4 layer compression 8/11-Patient returns at 1 week with small new area on the right lower leg where he says he picked at a loose piece of skin, this does not seem to be much more than small abrasion His left anterior leg wound appears to be about the same where using Silver alginate with 3 layer compression 01/03/2019 on evaluation today patient actually appears to be doing quite well with regard to his bilateral lower extremities. In fact his left lower extremity is the only area where I see an open wound still remaining the right seems to be completely healed. We have been using the silver alginate dressing with a 3 layer compression. 02/01/2019. The patient's wounds on his bilateral lower extremities are healed. He has dry flaking chronic eczematous skin. Thick patches and scales on predominantly the left leg left heel greater than right. He has been resistant to getting compression stockings although we have emphasized this needs to be done to maintain his skin integrity. He has not been  seen by a doctor here in less than a month but he has been in for nurse visits 02/27/2019; patient returns to clinic with substantial bilateral circumferential epithelial loss. He tells me this happened directly after he left the clinic. He states he did get stockings from [facility]. He has not been systemically unwell 10/20; the patient was readmitted to the clinic last week. He had bilateral  circumferential epithelial loss secondary to severe edema and marked stasis dermatitis right greater than left. I did not give him antibiotics. We put him back in 4 layer compression with TCA. We had him back for a nurse visit. He comes back in clinic and both legs look a lot better. He still has opened areas with loss of surface epithelium 03/21/2019 on evaluation today patient actually appears to be doing fairly well with regard to his Wounds on the bilateral lower extremities. He mainly has a couple areas open on the right lower extremity 1 on the toe wound and the leg. He really does not have anything open significantly on the left although he did pick it a couple of the dry skin areas which made them bleed just a little bit but again these do not really wounds. Fortunately there is no evidence of active infection at this time. No fevers, chills, nausea, vomiting, or diarrhea. 03/28/2019 on evaluation today patient appears to actually be doing quite well with regard to his bilateral lower extremities. He really does not have anything open at this time and seems to be doing quite well. He was supposed to have ordered his compression stockings last week he tells me he actually did it this Monday however. He has not received this from [facility] yet. Hopefully he will get them soon however. 04/04/19 upon evaluation today patient actually appears to be completely healed which is excellent news. He has been tolerating the compression wraps without complication and this has done an excellent job for him. Overall I am extremely happy with how things have gone. He has ordered his compression stockings I did confirm this with elastic therapy unfortunately they had some computer issues and ended up being delayed in getting these out to the patient. Nonetheless she tells me they were shipped on Monday and should be to him if not tomorrow by at least Friday. They could even show up today as they were in  [place] as of 1:30 AM this morning. Patient History Information obtained from Patient. Family History Cancer - Father,Mother,Siblings, No family history of Diabetes, Heart Disease, Hereditary Spherocytosis, Hypertension, Kidney Disease, Lung Disease, Seizures, Stroke, Thyroid Problems, Tuberculosis. Social History Current every day smoker, Marital Status - Widowed, Alcohol Use - Moderate, Drug Use - Prior History - pot , heorin. Medical History Eyes Denies history of Cataracts, Glaucoma, Optic Neuritis Ear/Nose/Mouth/Throat Denies history of Chronic sinus problems/congestion, Middle ear problems Hematologic/Lymphatic Patient has history of Lymphedema Denies history of Anemia, Hemophilia, Human Immunodeficiency Virus, Sickle Cell Disease Respiratory Patient has history of Chronic Obstructive Pulmonary Disease (COPD) Denies history of Aspiration, Asthma, Pneumothorax, Sleep Apnea, Tuberculosis Cardiovascular Denies history of Angina, Arrhythmia, Congestive Heart Failure, Coronary Artery Disease, Deep Vein Thrombosis, Hypertension, Hypotension, Myocardial Infarction, Peripheral Arterial Disease, Peripheral Venous Disease, Phlebitis, Vasculitis Gastrointestinal Denies history of Cirrhosis , Colitis, Crohnoos, Hepatitis A, Hepatitis B, Hepatitis C Endocrine Denies history of Type I Diabetes, Type II Diabetes Genitourinary Denies history of End Stage Renal Disease Immunological Denies history of Lupus Erythematosus, Raynaudoos, Scleroderma Integumentary (Skin) Denies history of History of Burn Musculoskeletal Denies history  of Gout, Rheumatoid Arthritis, Osteoarthritis, Osteomyelitis Neurologic Denies history of Dementia, Neuropathy, Quadriplegia, Paraplegia, Seizure Disorder Oncologic Denies history of Received Chemotherapy, Received Radiation Psychiatric Denies history of Anorexia/bulimia, Confinement Anxiety Review of Systems (ROS) Constitutional Symptoms (General  Health) Denies complaints or symptoms of Fatigue, Fever, Chills, Marked Weight Change. Respiratory Denies complaints or symptoms of Chronic or frequent coughs, Shortness of Breath. Cardiovascular Denies complaints or symptoms of Chest pain. Psychiatric Denies complaints or symptoms of Claustrophobia, Suicidal. Objective Constitutional Well-nourished and well-hydrated in no acute distress. Vitals Time Taken: 1:42 PM, Height: 72 in, Weight: 185 lbs, BMI: 25.1, Temperature: 97.8 F, Pulse: 76 bpm, Respiratory Rate: 18 breaths/min, Blood Pressure: 160/58 mmHg. Respiratory normal breathing without difficulty. Psychiatric this patient is able to make decisions and demonstrates good insight into disease process. Alert and Oriented x 3. pleasant and cooperative. General Notes: Upon inspection patient's wound bed again showed signs of complete epithelization there is no signs of open wounds on his lower extremities bilaterally overall he is doing quite well as far as I am concerned. Assessment Active Problems ICD-10 Chronic venous hypertension (idiopathic) with ulcer and inflammation of bilateral lower extremity Non-pressure chronic ulcer of other part of right lower leg limited to breakdown of skin Non-pressure chronic ulcer of other part of left lower leg limited to breakdown of skin Plan Discharge From [facility] Services: Discharge from [place] [facility] Skin Barriers/Peri-Wound Care: Moisturizing lotion - both legs daily Wound Cleansing: May shower and wash wound with soap and water. Edema Control: Avoid standing for long periods of time Elevate legs to the level of the heart or above for 30 minutes daily and/or when sitting, a frequency of: - throughout day. Exercise regularly Support Garment 20-30 mm/Hg pressure to: - compression stockings to both legs daily Other: - ace wraps to both legs wrapped from toes to knee daily until compression stockings available 1. My suggestion at this  point is good to be that we go ahead and initiate Ace wraps bilaterally for the time being for him to wear until he gets his compression stockings. This should keep his swelling under good control until that point. 2. I also think that once he gets his compression stockings he needs to wear these daily from time he gets up in the morning till he goes to bed at night this will help keep the edema under good control which is what we need. He also needs to elevate his legs on a regular basis to keep the edema under excellent control. We will see the patient back for a follow-up visit as needed. Electronic Signature(s) Signed: 04/04/2019 5:11:42 PM By: Worthy Keeler PA-C Entered By: Worthy Keeler on 04/04/2019 14:22:46 -------------------------------------------------------------------------------- HxROS Details Patient Name: Date of Service: Omar Wagner. 04/04/2019 1:00 PM Medical Record [id number] Patient Account Number: [id number] Date of Birth/Sex: Treating RN: Sep 05, 1955 (63 y.o. Omar Wagner Primary Care Provider: Jack Quarto Other Clinician: Referring Provider: Treating Provider/Extender:Stone III, Bradd Burner, MARY Weeks in Treatment: 5 Information Obtained From Patient Constitutional Symptoms (General Health) Complaints and Symptoms: Negative for: Fatigue; Fever; Chills; Marked Weight Change Respiratory Complaints and Symptoms: Negative for: Chronic or frequent coughs; Shortness of Breath Medical History: Positive for: Chronic Obstructive Pulmonary Disease (COPD) Negative for: Aspiration; Asthma; Pneumothorax; Sleep Apnea; Tuberculosis Cardiovascular Complaints and Symptoms: Negative for: Chest pain Medical History: Negative for: Angina; Arrhythmia; Congestive Heart Failure; Coronary Artery Disease; Deep Vein Thrombosis; Hypertension; Hypotension; Myocardial Infarction; Peripheral Arterial Disease; Peripheral Venous Disease; Phlebitis;  Vasculitis Psychiatric Complaints and  Symptoms: Negative for: Claustrophobia; Suicidal Medical History: Negative for: Anorexia/bulimia; Confinement Anxiety Eyes Medical History: Negative for: Cataracts; Glaucoma; Optic Neuritis Ear/Nose/Mouth/Throat Medical History: Negative for: Chronic sinus problems/congestion; Middle ear problems Hematologic/Lymphatic Medical History: Positive for: Lymphedema Negative for: Anemia; Hemophilia; Human Immunodeficiency Virus; Sickle Cell Disease Gastrointestinal Medical History: Negative for: Cirrhosis ; Colitis; Crohns; Hepatitis A; Hepatitis B; Hepatitis C Endocrine Medical History: Negative for: Type I Diabetes; Type II Diabetes Genitourinary Medical History: Negative for: End Stage Renal Disease Immunological Medical History: Negative for: Lupus Erythematosus; Raynauds; Scleroderma Integumentary (Skin) Medical History: Negative for: History of Burn Musculoskeletal Medical History: Negative for: Gout; Rheumatoid Arthritis; Osteoarthritis; Osteomyelitis Neurologic Medical History: Negative for: Dementia; Neuropathy; Quadriplegia; Paraplegia; Seizure Disorder Oncologic Medical History: Negative for: Received Chemotherapy; Received Radiation Immunizations Pneumococcal Vaccine: Received Pneumococcal Vaccination: No Implantable Devices None Family and Social History Cancer: Yes - Father,Mother,Siblings; Diabetes: No; Heart Disease: No; Hereditary Spherocytosis: No; Hypertension: No; Kidney Disease: No; Lung Disease: No; Seizures: No; Stroke: No; Thyroid Problems: No; Tuberculosis: No; Current every day smoker; Marital Status - Widowed; Alcohol Use: Moderate; Drug Use: Prior History - pot , heorin; Financial Concerns: No; Food, Clothing or Shelter Needs: No; Support System Lacking: No Physician Affirmation I have reviewed and agree with the above information. Electronic Signature(s) Signed: 04/04/2019 5:11:42 PM By: Worthy Keeler  PA-C Signed: 04/04/2019 5:42:21 PM By: Baruch Gouty RN, BSN Entered By: Worthy Keeler on 04/04/2019 14:21:55 -------------------------------------------------------------------------------- SuperBill Details Patient Name: Date of Service: Omar Wagner 04/04/2019 Medical Record [id number] Patient Account Number: [id number] Date of Birth/Sex: Treating RN: 1956/02/05 (63 y.o. Omar Wagner Primary Care Provider: Jack Quarto Other Clinician: Referring Provider: Treating Provider/Extender:Stone III, Bradd Burner, MARY Weeks in Treatment: 5 Diagnosis Coding ICD-10 Codes Code Description [phone] Chronic venous hypertension (idiopathic) with ulcer and inflammation of bilateral lower extremity L97.811 Non-pressure chronic ulcer of other part of right lower leg limited to breakdown of skin L97.821 Non-pressure chronic ulcer of other part of left lower leg limited to breakdown of skin Facility Procedures CPT4 Code: [id number] Description: 90300 - WOUND CARE VISIT-LEV 3 EST PT Modifier: Quantity: 1 Physician Procedures CPT4: Code [id number] Description: 263 - WC PHYS LEVEL 3 - EST PT ICD-10 Diagnosis Description I87.333 Chronic venous hypertension (idiopathic) with ulcer and in lower extremity L97.811 Non-pressure chronic ulcer of other part of right lower le skin L97.821 Non-pressure  chronic ulcer of other part of left lower leg skin Modifier: flammation of bil g limited to brea limited to break Quantity: 1 ateral kdown of down of Electronic Signature(s) Signed: 04/04/2019 5:11:42 PM By: Worthy Keeler PA-C Entered By: Worthy Keeler on 04/04/2019 14:22:57

## 2019-04-12 ENCOUNTER — Emergency Department (HOSPITAL_COMMUNITY): Payer: Medicare Other

## 2019-04-12 ENCOUNTER — Encounter (HOSPITAL_COMMUNITY): Payer: Self-pay | Admitting: Obstetrics and Gynecology

## 2019-04-12 ENCOUNTER — Inpatient Hospital Stay (HOSPITAL_COMMUNITY)
Admission: EM | Admit: 2019-04-12 | Discharge: 2019-05-18 | DRG: 207 | Disposition: E | Payer: Medicare Other | Attending: Internal Medicine | Admitting: Internal Medicine

## 2019-04-12 ENCOUNTER — Other Ambulatory Visit: Payer: Self-pay

## 2019-04-12 DIAGNOSIS — R918 Other nonspecific abnormal finding of lung field: Secondary | ICD-10-CM | POA: Diagnosis not present

## 2019-04-12 DIAGNOSIS — F1111 Opioid abuse, in remission: Secondary | ICD-10-CM | POA: Diagnosis present

## 2019-04-12 DIAGNOSIS — J9819 Other pulmonary collapse: Secondary | ICD-10-CM

## 2019-04-12 DIAGNOSIS — Z85828 Personal history of other malignant neoplasm of skin: Secondary | ICD-10-CM

## 2019-04-12 DIAGNOSIS — J432 Centrilobular emphysema: Secondary | ICD-10-CM | POA: Diagnosis not present

## 2019-04-12 DIAGNOSIS — C3490 Malignant neoplasm of unspecified part of unspecified bronchus or lung: Secondary | ICD-10-CM | POA: Diagnosis not present

## 2019-04-12 DIAGNOSIS — J189 Pneumonia, unspecified organism: Secondary | ICD-10-CM

## 2019-04-12 DIAGNOSIS — Z803 Family history of malignant neoplasm of breast: Secondary | ICD-10-CM

## 2019-04-12 DIAGNOSIS — Z66 Do not resuscitate: Secondary | ICD-10-CM | POA: Diagnosis not present

## 2019-04-12 DIAGNOSIS — F17210 Nicotine dependence, cigarettes, uncomplicated: Secondary | ICD-10-CM | POA: Diagnosis present

## 2019-04-12 DIAGNOSIS — I480 Paroxysmal atrial fibrillation: Secondary | ICD-10-CM | POA: Diagnosis not present

## 2019-04-12 DIAGNOSIS — J9601 Acute respiratory failure with hypoxia: Secondary | ICD-10-CM

## 2019-04-12 DIAGNOSIS — E878 Other disorders of electrolyte and fluid balance, not elsewhere classified: Secondary | ICD-10-CM | POA: Diagnosis present

## 2019-04-12 DIAGNOSIS — Z9911 Dependence on respirator [ventilator] status: Secondary | ICD-10-CM | POA: Diagnosis not present

## 2019-04-12 DIAGNOSIS — J188 Other pneumonia, unspecified organism: Secondary | ICD-10-CM | POA: Diagnosis present

## 2019-04-12 DIAGNOSIS — J9621 Acute and chronic respiratory failure with hypoxia: Secondary | ICD-10-CM | POA: Diagnosis present

## 2019-04-12 DIAGNOSIS — C787 Secondary malignant neoplasm of liver and intrahepatic bile duct: Secondary | ICD-10-CM | POA: Diagnosis present

## 2019-04-12 DIAGNOSIS — Z79899 Other long term (current) drug therapy: Secondary | ICD-10-CM | POA: Diagnosis not present

## 2019-04-12 DIAGNOSIS — J439 Emphysema, unspecified: Secondary | ICD-10-CM | POA: Diagnosis present

## 2019-04-12 DIAGNOSIS — D696 Thrombocytopenia, unspecified: Secondary | ICD-10-CM | POA: Diagnosis not present

## 2019-04-12 DIAGNOSIS — L8990 Pressure ulcer of unspecified site, unspecified stage: Secondary | ICD-10-CM | POA: Insufficient documentation

## 2019-04-12 DIAGNOSIS — Z978 Presence of other specified devices: Secondary | ICD-10-CM

## 2019-04-12 DIAGNOSIS — E559 Vitamin D deficiency, unspecified: Secondary | ICD-10-CM | POA: Diagnosis present

## 2019-04-12 DIAGNOSIS — E669 Obesity, unspecified: Secondary | ICD-10-CM | POA: Diagnosis present

## 2019-04-12 DIAGNOSIS — C7989 Secondary malignant neoplasm of other specified sites: Secondary | ICD-10-CM | POA: Diagnosis present

## 2019-04-12 DIAGNOSIS — Z800 Family history of malignant neoplasm of digestive organs: Secondary | ICD-10-CM

## 2019-04-12 DIAGNOSIS — C3412 Malignant neoplasm of upper lobe, left bronchus or lung: Secondary | ICD-10-CM | POA: Diagnosis present

## 2019-04-12 DIAGNOSIS — J9811 Atelectasis: Secondary | ICD-10-CM | POA: Diagnosis not present

## 2019-04-12 DIAGNOSIS — E872 Acidosis: Secondary | ICD-10-CM | POA: Diagnosis not present

## 2019-04-12 DIAGNOSIS — Z6826 Body mass index (BMI) 26.0-26.9, adult: Secondary | ICD-10-CM

## 2019-04-12 DIAGNOSIS — R410 Disorientation, unspecified: Secondary | ICD-10-CM | POA: Diagnosis not present

## 2019-04-12 DIAGNOSIS — I10 Essential (primary) hypertension: Secondary | ICD-10-CM | POA: Diagnosis not present

## 2019-04-12 DIAGNOSIS — R739 Hyperglycemia, unspecified: Secondary | ICD-10-CM | POA: Diagnosis present

## 2019-04-12 DIAGNOSIS — Z515 Encounter for palliative care: Secondary | ICD-10-CM | POA: Diagnosis not present

## 2019-04-12 DIAGNOSIS — D6959 Other secondary thrombocytopenia: Secondary | ICD-10-CM | POA: Diagnosis not present

## 2019-04-12 DIAGNOSIS — I471 Supraventricular tachycardia: Secondary | ICD-10-CM | POA: Diagnosis not present

## 2019-04-12 DIAGNOSIS — I890 Lymphedema, not elsewhere classified: Secondary | ICD-10-CM | POA: Diagnosis not present

## 2019-04-12 DIAGNOSIS — Z7189 Other specified counseling: Secondary | ICD-10-CM | POA: Diagnosis not present

## 2019-04-12 DIAGNOSIS — R0602 Shortness of breath: Secondary | ICD-10-CM | POA: Diagnosis not present

## 2019-04-12 DIAGNOSIS — G893 Neoplasm related pain (acute) (chronic): Secondary | ICD-10-CM | POA: Diagnosis present

## 2019-04-12 DIAGNOSIS — Z781 Physical restraint status: Secondary | ICD-10-CM

## 2019-04-12 DIAGNOSIS — I4891 Unspecified atrial fibrillation: Secondary | ICD-10-CM | POA: Diagnosis not present

## 2019-04-12 DIAGNOSIS — I872 Venous insufficiency (chronic) (peripheral): Secondary | ICD-10-CM | POA: Diagnosis present

## 2019-04-12 DIAGNOSIS — C771 Secondary and unspecified malignant neoplasm of intrathoracic lymph nodes: Secondary | ICD-10-CM | POA: Diagnosis present

## 2019-04-12 DIAGNOSIS — Z794 Long term (current) use of insulin: Secondary | ICD-10-CM | POA: Diagnosis not present

## 2019-04-12 DIAGNOSIS — Z5111 Encounter for antineoplastic chemotherapy: Secondary | ICD-10-CM | POA: Diagnosis not present

## 2019-04-12 DIAGNOSIS — I878 Other specified disorders of veins: Secondary | ICD-10-CM | POA: Diagnosis present

## 2019-04-12 DIAGNOSIS — E876 Hypokalemia: Secondary | ICD-10-CM | POA: Diagnosis not present

## 2019-04-12 DIAGNOSIS — J9602 Acute respiratory failure with hypercapnia: Secondary | ICD-10-CM | POA: Diagnosis not present

## 2019-04-12 DIAGNOSIS — Z20828 Contact with and (suspected) exposure to other viral communicable diseases: Secondary | ICD-10-CM | POA: Diagnosis present

## 2019-04-12 DIAGNOSIS — J9622 Acute and chronic respiratory failure with hypercapnia: Secondary | ICD-10-CM | POA: Diagnosis present

## 2019-04-12 DIAGNOSIS — S81809A Unspecified open wound, unspecified lower leg, initial encounter: Secondary | ICD-10-CM | POA: Diagnosis not present

## 2019-04-12 DIAGNOSIS — R600 Localized edema: Secondary | ICD-10-CM | POA: Diagnosis not present

## 2019-04-12 DIAGNOSIS — J449 Chronic obstructive pulmonary disease, unspecified: Secondary | ICD-10-CM | POA: Diagnosis present

## 2019-04-12 DIAGNOSIS — R52 Pain, unspecified: Secondary | ICD-10-CM | POA: Diagnosis not present

## 2019-04-12 DIAGNOSIS — R000 Tachycardia, unspecified: Secondary | ICD-10-CM | POA: Diagnosis not present

## 2019-04-12 DIAGNOSIS — J441 Chronic obstructive pulmonary disease with (acute) exacerbation: Secondary | ICD-10-CM | POA: Diagnosis not present

## 2019-04-12 DIAGNOSIS — T451X5A Adverse effect of antineoplastic and immunosuppressive drugs, initial encounter: Secondary | ICD-10-CM | POA: Diagnosis not present

## 2019-04-12 DIAGNOSIS — C7951 Secondary malignant neoplasm of bone: Secondary | ICD-10-CM | POA: Diagnosis present

## 2019-04-12 DIAGNOSIS — Z0189 Encounter for other specified special examinations: Secondary | ICD-10-CM

## 2019-04-12 DIAGNOSIS — I472 Ventricular tachycardia: Secondary | ICD-10-CM | POA: Diagnosis not present

## 2019-04-12 DIAGNOSIS — E162 Hypoglycemia, unspecified: Secondary | ICD-10-CM | POA: Diagnosis not present

## 2019-04-12 DIAGNOSIS — C7931 Secondary malignant neoplasm of brain: Secondary | ICD-10-CM | POA: Diagnosis present

## 2019-04-12 DIAGNOSIS — D6481 Anemia due to antineoplastic chemotherapy: Secondary | ICD-10-CM | POA: Diagnosis not present

## 2019-04-12 DIAGNOSIS — J9600 Acute respiratory failure, unspecified whether with hypoxia or hypercapnia: Secondary | ICD-10-CM

## 2019-04-12 DIAGNOSIS — T17908S Unspecified foreign body in respiratory tract, part unspecified causing other injury, sequela: Secondary | ICD-10-CM

## 2019-04-12 DIAGNOSIS — R0600 Dyspnea, unspecified: Secondary | ICD-10-CM

## 2019-04-12 DIAGNOSIS — E119 Type 2 diabetes mellitus without complications: Secondary | ICD-10-CM | POA: Diagnosis not present

## 2019-04-12 DIAGNOSIS — L89156 Pressure-induced deep tissue damage of sacral region: Secondary | ICD-10-CM | POA: Diagnosis not present

## 2019-04-12 DIAGNOSIS — R1310 Dysphagia, unspecified: Secondary | ICD-10-CM | POA: Diagnosis present

## 2019-04-12 DIAGNOSIS — F329 Major depressive disorder, single episode, unspecified: Secondary | ICD-10-CM | POA: Diagnosis present

## 2019-04-12 HISTORY — DX: Opioid use, unspecified, in remission: F11.91

## 2019-04-12 HISTORY — DX: Venous insufficiency (chronic) (peripheral): I87.2

## 2019-04-12 HISTORY — DX: Personal history of other specified conditions: Z87.898

## 2019-04-12 HISTORY — DX: Lymphedema, not elsewhere classified: I89.0

## 2019-04-12 HISTORY — DX: Supraventricular tachycardia, unspecified: I47.10

## 2019-04-12 HISTORY — DX: Supraventricular tachycardia: I47.1

## 2019-04-12 HISTORY — DX: Other nonspecific abnormal finding of lung field: R91.8

## 2019-04-12 LAB — COMPREHENSIVE METABOLIC PANEL
ALT: 16 U/L (ref 0–44)
AST: 35 U/L (ref 15–41)
Albumin: 3.6 g/dL (ref 3.5–5.0)
Alkaline Phosphatase: 81 U/L (ref 38–126)
Anion gap: 10 (ref 5–15)
BUN: 20 mg/dL (ref 8–23)
CO2: 29 mmol/L (ref 22–32)
Calcium: 14.1 mg/dL (ref 8.9–10.3)
Chloride: 104 mmol/L (ref 98–111)
Creatinine, Ser: 1.02 mg/dL (ref 0.61–1.24)
GFR calc Af Amer: >60 mL/min (ref >60)
GFR calc non Af Amer: >60 mL/min (ref >60)
Glucose, Bld: 104 mg/dL — ABNORMAL HIGH (ref 70–99)
Potassium: 3.7 mmol/L (ref 3.5–5.1)
Sodium: 143 mmol/L (ref 135–145)
Total Bilirubin: 1.1 mg/dL (ref 0.3–1.2)
Total Protein: 7.5 g/dL (ref 6.5–8.1)

## 2019-04-12 LAB — CBC WITH DIFFERENTIAL/PLATELET
Abs Immature Granulocytes: 0.02 10*3/uL (ref 0.00–0.07)
Basophils Absolute: 0 10*3/uL (ref 0.0–0.1)
Basophils Relative: 0 %
Eosinophils Absolute: 0 10*3/uL (ref 0.0–0.5)
Eosinophils Relative: 0 %
HCT: 45.6 % (ref 39.0–52.0)
Hemoglobin: 14.7 g/dL (ref 13.0–17.0)
Immature Granulocytes: 0 %
Lymphocytes Relative: 9 %
Lymphs Abs: 0.7 10*3/uL (ref 0.7–4.0)
MCH: 35.9 pg — ABNORMAL HIGH (ref 26.0–34.0)
MCHC: 32.2 g/dL (ref 30.0–36.0)
MCV: 111.5 fL — ABNORMAL HIGH (ref 80.0–100.0)
Monocytes Absolute: 0.5 10*3/uL (ref 0.1–1.0)
Monocytes Relative: 6 %
Neutro Abs: 6.8 10*3/uL (ref 1.7–7.7)
Neutrophils Relative %: 85 %
Platelets: 153 10*3/uL (ref 150–400)
RBC: 4.09 MIL/uL — ABNORMAL LOW (ref 4.22–5.81)
RDW: 12.3 % (ref 11.5–15.5)
WBC: 8.1 10*3/uL (ref 4.0–10.5)
nRBC: 0 % (ref 0.0–0.2)

## 2019-04-12 LAB — BLOOD GAS, VENOUS
Acid-Base Excess: 6.7 mmol/L — ABNORMAL HIGH (ref 0.0–2.0)
Bicarbonate: 31.9 mmol/L — ABNORMAL HIGH (ref 20.0–28.0)
O2 Saturation: 69.2 %
Patient temperature: 98.6
pCO2, Ven: 48.8 mmHg (ref 44.0–60.0)
pH, Ven: 7.4 (ref 7.250–7.430)
pO2, Ven: 38.3 mmHg (ref 32.0–45.0)

## 2019-04-12 LAB — BRAIN NATRIURETIC PEPTIDE: B Natriuretic Peptide: 84.3 pg/mL (ref 0.0–100.0)

## 2019-04-12 LAB — LACTIC ACID, PLASMA: Lactic Acid, Venous: 2.2 mmol/L (ref 0.5–1.9)

## 2019-04-12 LAB — POC SARS CORONAVIRUS 2 AG -  ED: SARS Coronavirus 2 Ag: NEGATIVE

## 2019-04-12 LAB — SARS CORONAVIRUS 2 (TAT 6-24 HRS): SARS Coronavirus 2: NEGATIVE

## 2019-04-12 LAB — MRSA PCR SCREENING: MRSA by PCR: NEGATIVE

## 2019-04-12 MED ORDER — MONTELUKAST SODIUM 10 MG PO TABS
10.0000 mg | ORAL_TABLET | Freq: Every day | ORAL | Status: DC
  Administered 2019-04-12 – 2019-04-14 (×3): 10 mg via ORAL
  Filled 2019-04-12 (×4): qty 1

## 2019-04-12 MED ORDER — SODIUM CHLORIDE 0.9% FLUSH: 3.0000 mL | INTRAVENOUS | Status: DC | PRN

## 2019-04-12 MED ORDER — ONDANSETRON HCL 4 MG PO TABS: 4.0000 mg | ORAL_TABLET | Freq: Four times a day (QID) | ORAL | Status: DC | PRN

## 2019-04-12 MED ORDER — CHLORHEXIDINE GLUCONATE CLOTH 2 % EX PADS
6.0000 | MEDICATED_PAD | Freq: Every day | CUTANEOUS | Status: DC
  Administered 2019-04-12 – 2019-04-28 (×14): 6 via TOPICAL

## 2019-04-12 MED ORDER — METOPROLOL TARTRATE 25 MG PO TABS
25.0000 mg | ORAL_TABLET | Freq: Two times a day (BID) | ORAL | Status: DC
  Administered 2019-04-12 – 2019-04-14 (×4): 25 mg via ORAL
  Filled 2019-04-12 (×4): qty 1

## 2019-04-12 MED ORDER — SODIUM CHLORIDE 0.9 % IV SOLN: 250.0000 mL | INTRAVENOUS | Status: AC

## 2019-04-12 MED ORDER — ALBUTEROL SULFATE (2.5 MG/3ML) 0.083% IN NEBU: 3.0000 mL | INHALATION_SOLUTION | RESPIRATORY_TRACT | Status: DC | PRN

## 2019-04-12 MED ORDER — PIPERACILLIN-TAZOBACTAM 3.375 G IVPB
3.3750 g | Freq: Three times a day (TID) | INTRAVENOUS | Status: AC
  Administered 2019-04-12 – 2019-04-19 (×22): 3.375 g via INTRAVENOUS
  Filled 2019-04-12 (×22): qty 50

## 2019-04-12 MED ORDER — MOMETASONE FURO-FORMOTEROL FUM 100-5 MCG/ACT IN AERO
2.0000 | INHALATION_SPRAY | Freq: Two times a day (BID) | RESPIRATORY_TRACT | Status: DC
  Administered 2019-04-12 – 2019-04-13 (×2): 2 via RESPIRATORY_TRACT
  Filled 2019-04-12: qty 8.8

## 2019-04-12 MED ORDER — ONDANSETRON HCL 4 MG/2ML IJ SOLN: 4.0000 mg | Freq: Four times a day (QID) | INTRAMUSCULAR | Status: DC | PRN

## 2019-04-12 MED ORDER — SODIUM CHLORIDE 0.9 % IV SOLN: 1.0000 g | Freq: Once | INTRAVENOUS | Status: DC

## 2019-04-12 MED ORDER — SODIUM CHLORIDE 0.9 % IV SOLN
2.0000 g | Freq: Once | INTRAVENOUS | Status: AC
  Administered 2019-04-12: 2 g via INTRAVENOUS
  Filled 2019-04-12: qty 2

## 2019-04-12 MED ORDER — CALCITONIN (SALMON) 200 UNIT/ML IJ SOLN
400.0000 [IU] | Freq: Two times a day (BID) | INTRAMUSCULAR | Status: DC
  Administered 2019-04-12: 400 [IU] via SUBCUTANEOUS
  Filled 2019-04-12 (×2): qty 2

## 2019-04-12 MED ORDER — ALBUTEROL SULFATE HFA 108 (90 BASE) MCG/ACT IN AERS
8.0000 | INHALATION_SPRAY | Freq: Once | RESPIRATORY_TRACT | Status: AC
  Administered 2019-04-12: 14:00:00 8 via RESPIRATORY_TRACT
  Filled 2019-04-12: qty 6.7

## 2019-04-12 MED ORDER — IPRATROPIUM BROMIDE HFA 17 MCG/ACT IN AERS
4.0000 | INHALATION_SPRAY | Freq: Once | RESPIRATORY_TRACT | Status: AC
  Administered 2019-04-12: 4 via RESPIRATORY_TRACT
  Filled 2019-04-12: qty 12.9

## 2019-04-12 MED ORDER — SODIUM CHLORIDE 0.9 % IV SOLN
500.0000 mg | Freq: Once | INTRAVENOUS | Status: DC
  Administered 2019-04-12: 500 mg via INTRAVENOUS
  Filled 2019-04-12: qty 500

## 2019-04-12 MED ORDER — ORAL CARE MOUTH RINSE
15.0000 mL | Freq: Two times a day (BID) | OROMUCOSAL | Status: DC
  Administered 2019-04-12 – 2019-04-15 (×7): 15 mL via OROMUCOSAL

## 2019-04-12 MED ORDER — ACETAMINOPHEN 325 MG PO TABS
650.0000 mg | ORAL_TABLET | Freq: Four times a day (QID) | ORAL | Status: DC | PRN
  Administered 2019-04-21 – 2019-04-27 (×3): 650 mg via ORAL
  Filled 2019-04-12 (×3): qty 2

## 2019-04-12 MED ORDER — SODIUM CHLORIDE 0.9 % IV SOLN
1.0000 g | Freq: Once | INTRAVENOUS | Status: DC
  Administered 2019-04-12: 1 g via INTRAVENOUS
  Filled 2019-04-12: qty 10

## 2019-04-12 MED ORDER — MAGNESIUM SULFATE 2 GM/50ML IV SOLN
2.0000 g | Freq: Once | INTRAVENOUS | Status: AC
  Administered 2019-04-12: 16:00:00 2 g via INTRAVENOUS
  Filled 2019-04-12: qty 50

## 2019-04-12 MED ORDER — HYDROCODONE-ACETAMINOPHEN 5-325 MG PO TABS
1.0000 | ORAL_TABLET | ORAL | Status: DC | PRN
  Administered 2019-04-12: 1 via ORAL
  Administered 2019-04-13 – 2019-04-14 (×3): 2 via ORAL
  Administered 2019-04-14: 1 via ORAL
  Filled 2019-04-12 (×2): qty 2
  Filled 2019-04-12 (×2): qty 1
  Filled 2019-04-12: qty 2

## 2019-04-12 MED ORDER — IOHEXOL 300 MG/ML  SOLN
75.0000 mL | Freq: Once | INTRAMUSCULAR | Status: AC | PRN
  Administered 2019-04-12: 75 mL via INTRAVENOUS

## 2019-04-12 MED ORDER — VANCOMYCIN HCL 10 G IV SOLR
1750.0000 mg | Freq: Once | INTRAVENOUS | Status: AC
  Administered 2019-04-12: 1750 mg via INTRAVENOUS
  Filled 2019-04-12: qty 1750

## 2019-04-12 MED ORDER — METHYLPREDNISOLONE SODIUM SUCC 125 MG IJ SOLR
125.0000 mg | Freq: Once | INTRAMUSCULAR | Status: AC
  Administered 2019-04-12: 125 mg via INTRAVENOUS
  Filled 2019-04-12: qty 2

## 2019-04-12 MED ORDER — SODIUM CHLORIDE 0.9% FLUSH
3.0000 mL | Freq: Two times a day (BID) | INTRAVENOUS | Status: DC
  Administered 2019-04-12 – 2019-04-15 (×5): 3 mL via INTRAVENOUS

## 2019-04-12 MED ORDER — SODIUM CHLORIDE (PF) 0.9 % IJ SOLN
INTRAMUSCULAR | Status: AC
  Filled 2019-04-12: qty 50

## 2019-04-12 MED ORDER — ZOLEDRONIC ACID 4 MG/5ML IV CONC
4.0000 mg | Freq: Once | INTRAVENOUS | Status: AC
  Administered 2019-04-12: 23:00:00 4 mg via INTRAVENOUS
  Filled 2019-04-12: qty 5

## 2019-04-12 MED ORDER — ACETAMINOPHEN 650 MG RE SUPP
650.0000 mg | Freq: Four times a day (QID) | RECTAL | Status: DC | PRN
  Administered 2019-04-15: 650 mg via RECTAL
  Filled 2019-04-12: qty 1

## 2019-04-12 MED ORDER — SODIUM CHLORIDE 0.9 % IV SOLN
INTRAVENOUS | Status: DC
  Administered 2019-04-12 – 2019-04-15 (×5): via INTRAVENOUS

## 2019-04-12 NOTE — H&P (Addendum)
[name] TKP:[id number] DOB: 1955/06/27 DOA: 04/08/2019     PCP: Marliss Coots, NP   Outpatient Specialists:     Pulmonary  Dr. Melvyn Novas    Patient arrived to ER on 04/07/2019 at 1331  Patient coming from: home Lives  With girlfriend    Chief Complaint:   Chief Complaint  Patient presents with   Shortness of Breath    HPI: Omar Wagner is a 63 y.o. male with medical history significant of  COPD, lung mass  Bilateral lymphedema PSVT.  History of squamous cell carcinoma of the skin, basal cell carcinoma of the skin.  History of drug abuse in the past  Presented with difficult shortness of breath  he continues to Have cough its been entered productive of sputum but for the past 2 to 3 days has gotten significantly worse short of breath.  No fevers no nausea vomiting. Currently on steroids and has increased his prednisone last week to try to see if it would help with her shortness of breath He continues to use his inhalers but does not seem to help.  Patient with known lung mass seems a CT biopsy was ordered to be done on 29 October but  was canceled because patient was feeling like he is having a COPD flare his been coughing extensively with some wheezing shortness of breath and chest tightness he has been getting more more short of breath just by walking across her room.Marland Kitchen  He has longstanding bilateral lymphedema To be seen at wound care on 4 November thought to have areas of ulceration secondary to chronic venous insufficiency Recommended initiation of Ace wraps bilaterally  Infectious risk factors:  Reports shortness of breath, dry cough, chest pain,    In  ER RAPID COVID TEST NEGATIVE,  RCR in house testing  Pending  No results found for: SARSCOV2NAA    Regarding pertinent Chronic problems:   History of paroxysmal SVT on metoprolol      COPD - followed by pulmonology  Not on baseline oxygen    On chronic inhalers and S ABA and steroids    While in  ER: Nephrogenic tachypneic with accessory muscle use.  We will extremity draining fluid bilaterally and edema below the knee which is per patient chronic. Patient did to be hypoxic satting 97% on room air requiring up to 4 L of oxygen to maintain oxygen saturation low 90s.  Unable to speak in short sentences.  Patient was given albuterol Atrovent Solu-Medrol magnesium initially thought to have COPD exacerbation but CT scan was done Showing mass with obstruction and completely opacified left hemithorax felt secondary to postobstructive pneumonia and lung collapse With evidence of metastatic spread to the liver. Patient was covered IV antibiotics cefepime and Vanco postobstructive pneumonia Rapid Covid test initially negative PCR pending  Lactic acid 2.2     The following Work up has been ordered so far:  Orders Placed This Encounter  Procedures   Blood culture (routine x 2)   SARS CORONAVIRUS 2 (TAT 6-24 HRS) Nasopharyngeal Nasopharyngeal Swab   DG Chest Port 1 View   CT Chest W Contrast   CBC with Differential   Comprehensive metabolic panel   Lactate   CBC with Differential/Platelet   Brain natriuretic peptide   Blood gas, venous (at [facility] and [facility], not at [facility])   Consult to hospitalist  ALL PATIENTS BEING ADMITTED/HAVING PROCEDURES NEED COVID-19 SCREENING   Airborne and Contact precautions   POC SARS Coronavirus 2  Nasal Swab (BD Veritor Kit)  °• EKG 12-Lead  °• EKG  ° °  °Following Medications were ordered in ER: °Medications  °sodium chloride (PF) 0.9 % injection (has no administration in time range)  °ceFEPIme (MAXIPIME) 2 g in sodium chloride 0.9 % 100 mL IVPB (has no administration in time range)  °vancomycin (VANCOCIN) 1,750 mg in sodium chloride 0.9 % 500 mL IVPB (has no administration in time range)  °methylPREDNISolone sodium succinate (SOLU-MEDROL) 125 mg/2 mL injection 125 mg (125 mg Intravenous Given 03/25/2019 1536)  °magnesium sulfate IVPB 2 g 50 mL (0 g  Intravenous Stopped 03/22/2019 1556)  °albuterol (VENTOLIN HFA) 108 (90 Base) MCG/ACT inhaler 8 puff (8 puffs Inhalation Given 04/02/2019 1425)  °ipratropium (ATROVENT HFA) inhaler 4 puff (4 puffs Inhalation Given 04/06/2019 1554)  °iohexol (OMNIPAQUE) 300 MG/ML solution 75 mL (75 mLs Intravenous Contrast Given 03/21/2019 1715)  ° °  ° °  °Consult Orders  °(From admission, onward)  °  ° °  °  Start     Ordered  ° 03/27/2019 1756  Consult to hospitalist  ALL PATIENTS BEING ADMITTED/HAVING PROCEDURES NEED COVID-19 SCREENING  Once    °Comments: ALL PATIENTS BEING ADMITTED/HAVING PROCEDURES NEED COVID-19 SCREENING  °Provider:  (Not yet assigned)  °Question Answer Comment  °Place call to: [facility]   °Reason for Consult Admit   °Diagnosis/Clinical Info for Consult: 1852   °  ° 03/30/2019 1755  °  °  °  °  ° °Significant initial  Findings: °Abnormal Labs Reviewed  °COMPREHENSIVE METABOLIC PANEL - Abnormal; Notable for the following components:  °    Result Value  ° Glucose, Bld 104 (*)   ° Calcium 14.1 (*)   ° All other components within normal limits  °LACTIC ACID, PLASMA - Abnormal; Notable for the following components:  ° Lactic Acid, Venous 2.2 (*)   ° All other components within normal limits  °CBC WITH DIFFERENTIAL/PLATELET - Abnormal; Notable for the following components:  ° RBC 4.09 (*)   ° MCV 111.5 (*)   ° MCH 35.9 (*)   ° All other components within normal limits  °BLOOD GAS, VENOUS - Abnormal; Notable for the following components:  ° Bicarbonate 31.9 (*)   ° Acid-Base Excess 6.7 (*)   ° All other components within normal limits  °  °Otherwise labs showing: ° °  °Recent Labs  °Lab 03/24/2019 °1350  °NA 143  °K 3.7  °CO2 29  °GLUCOSE 104*  °BUN 20  °CREATININE 1.02  °CALCIUM 14.1*  ° ° °Cr  * stable,  Up from baseline see below °Lab Results  °Component Value Date  ° CREATININE 1.02 04/06/2019  ° CREATININE 0.83 10/27/2018  ° CREATININE 0.88 12/16/2014  ° ° °Recent Labs  °Lab 03/19/2019 °1350  °AST 35  °ALT 16  °ALKPHOS  81  °BILITOT 1.1  °PROT 7.5  °ALBUMIN 3.6  ° °Lab Results  °Component Value Date  ° CALCIUM 14.1 (HH) 03/29/2019  ° °   °WBC °  °   °Component Value Date/Time  ° WBC 8.1 03/27/2019 1441  ° °ANC °   °Component Value Date/Time  ° NEUTROABS 6.8 03/29/2019 1441  ° °ALC °No components found for: LYMPHAB °  ° °Plt: °Lab Results  °Component Value Date  ° PLT 153 04/07/2019  ° ° ° °Lactic Acid, Venous °   °Component Value Date/Time  ° LATICACIDVEN 2.2 (HH) 04/13/2019 1350  °   °COVID-19 Labs ° °No results for input(s): DDIMER, FERRITIN,   LDH, CRP in the last 72 hours. ° °No results found for: SARSCOV2NAA ° ° ° Venous  Blood Gas result:  pH 7.40 pCO2 48.8   ° °ABG °   °Component Value Date/Time  ° HCO3 31.9 (H) 04/11/2019 1517  ° TCO2 26.1 01/11/2013 0610  ° O2SAT 69.2 03/20/2019 1517  ° °  ° °HG/HCT  stable,   °   °Component Value Date/Time  ° HGB 14.7 03/30/2019 1441  ° HCT 45.6 04/06/2019 1441  ° °  ECG: Ordered °Personally reviewed by me showing: °HR : 90 °Rhythm:  NSR, multiple PVCs multifocal °  no evidence of ischemic changes °QTC 426 ° ° °BNP (last 3 results) °Recent Labs  °  10/27/18 °0803 04/05/2019 °1458  °BNP 51.6 84.3  ° °    ° UA not ordered °  °Urine analysis: °   °Component Value Date/Time  ° COLORURINE YELLOW 12/16/2014 2132  ° APPEARANCEUR CLEAR 12/16/2014 2132  ° LABSPEC 1.004 (L) 12/16/2014 2132  ° PHURINE 6.0 12/16/2014 2132  ° GLUCOSEU NEGATIVE 12/16/2014 2132  ° HGBUR LARGE (A) 12/16/2014 2132  ° BILIRUBINUR NEGATIVE 12/16/2014 2132  ° KETONESUR NEGATIVE 12/16/2014 2132  ° PROTEINUR NEGATIVE 12/16/2014 2132  ° UROBILINOGEN 1.0 12/16/2014 2132  ° NITRITE NEGATIVE 12/16/2014 2132  ° LEUKOCYTESUR NEGATIVE 12/16/2014 2132  ° °    °Ordered °  °CXR -left upper lobe malignancy with postobstructive °change/atelectasis  °  °CTA chest -New near complete collapse of the left upper lobe °  ° °  °ED Triage Vitals  °Enc Vitals Group  °   BP 04/04/2019 1341 (!) 154/82  °   Pulse Rate 04/13/2019 1341 94  °   Resp 04/11/2019  1341 18  °   Temp 04/10/2019 1341 98 °F (36.7 °C)  °   Temp Source 04/01/2019 1341 Oral  °   SpO2 03/19/2019 1341 (!) 87 %  °   Weight --   °   Height --   °   Head Circumference --   °   Peak Flow --   °   Pain Score 03/20/2019 1342 8  °   Pain Loc --   °   Pain Edu? --   °   Excl. in GC? --   °TMAX(24)@    °  ° °Latest  Blood pressure (!) 151/113, pulse (!) 110, temperature 98 °F (36.7 °C), temperature source Oral, resp. rate 20, SpO2 90 %. °  ° Hospitalist was called for admission for postobstructive PNA ° ° °Review of Systems:  ° ° Pertinent positives include:  shortness of breath at rest.dyspnea on exertion, ° °Constitutional:  °No weight loss, night sweats, Fevers, chills, fatigue, weight loss  °HEENT:  °No headaches, Difficulty swallowing,Tooth/dental problems,Sore throat,  °No sneezing, itching, ear ache, nasal congestion, post nasal drip,  °Cardio-vascular:  °No chest pain, Orthopnea, PND, anasarca, dizziness, palpitations.no Bilateral lower extremity swelling  °GI:  °No heartburn, indigestion, abdominal pain, nausea, vomiting, diarrhea, change in bowel habits, loss of appetite, melena, blood in stool, hematemesis °Resp:  °  No excess mucus, no productive cough, No non-productive cough, No coughing up of blood.No change in color of mucus.No wheezing. °Skin:  °no rash or lesions. No jaundice °GU:  °no dysuria, change in color of urine, no urgency or frequency. No straining to urinate.  °No flank pain.  °Musculoskeletal:  °No joint pain or no joint swelling. No decreased range of motion. No back pain.  °Psych:  °No change in mood or   mood or affect. No depression or anxiety. No memory loss.  Neuro: no localizing neurological complaints, no tingling, no weakness, no double vision, no gait abnormality, no slurred speech, no confusion  All systems reviewed and apart from [place] all are negative  Past Medical History:   Past Medical History:  Diagnosis Date   Basal cell carcinoma    COPD (chronic obstructive pulmonary  disease) (HCC)    Depression      Past Surgical History:  Procedure Laterality Date   HERNIA REPAIR      Social History:  Ambulatory  independently    reports that he has been smoking cigarettes. He has a 30.00 pack-year smoking history. He has never used smokeless tobacco. He reports current alcohol use. He reports current drug use. Drugs: Heroin and IV. Family History:  Family History  Problem Relation Age of Onset   Cancer Other    Breast cancer Mother    Colon cancer Father    Breast cancer Sister    Liver cancer Brother     Allergies: No Known Allergies   Prior to Admission medications   Medication Sig Start Date End Date Taking? Authorizing Provider  albuterol (ACCUNEB) 0.63 MG/3ML nebulizer solution Take 3 mLs (0.63 mg total) by nebulization every 6 (six) hours as needed for wheezing or shortness of breath. 03/14/19  Yes Tanda Rockers, MD  albuterol (VENTOLIN HFA) 108 (90 Base) MCG/ACT inhaler Inhale 2 puffs into the lungs every 4 (four) hours as needed for wheezing or shortness of breath. 03/14/19  Yes Tanda Rockers, MD  metoprolol tartrate (LOPRESSOR) 25 MG tablet Take 1 tablet (25 mg total) by mouth 2 (two) times daily. 12/07/17  Yes Martyn Ehrich, NP  montelukast (SINGULAIR) 10 MG tablet Take 10 mg by mouth daily. 03/04/19  Yes [provider]  Multiple Vitamin (MULITIVITAMIN WITH MINERALS) TABS Take 1 tablet by mouth daily.    [provider]  predniSONE (DELTASONE) 10 MG tablet Take  4 each am x 2 days,   2 each am x 2 days,  1 each am x 2 days and stop Patient not taking: Reported on 04/11/2019 03/29/19   Tanda Rockers, MD   Physical Exam: Blood pressure (!) 151/113, pulse (!) 110, temperature 98 F (36.7 C), temperature source Oral, resp. rate 20, SpO2 90 %. 1. General:  in Acute distress increased work of breathing   Chronically ill -appearing 2. Psychological: Alert and   Oriented 3. Head/ENT:  Dry Mucous Membranes                           Head Non traumatic, neck supple                           Poor Dentition 4. SKIN:  decreased Skin turgor,  Skin clean Dry scaling wounds in the lower extremities bilaterally with some open wounds weeping 5. Heart: Regular rate and rhythm no  Murmur, no Rub or gallop 6. Lungs: some  wheezes or crackles   7. Abdomen: Soft,  non-tender  distended  bowel sounds present 8. Lower extremities: no clubbing, cyanosis,  Trace edema 9. Neurologically Grossly intact, moving all 4 extremities equally   10. MSK: Normal range of motion   All other LABS:     Recent Labs  Lab 04/07/2019 1441  WBC 8.1  NEUTROABS 6.8  HGB 14.7  HCT 45.6  MCV 111.5*  Recent Labs  °Lab 04/02/2019 °1350  °NA 143  °K 3.7  °CL 104  °CO2 29  °GLUCOSE 104*  °BUN 20  °CREATININE 1.02  °CALCIUM 14.1*  ° °  °Recent Labs  °Lab 03/30/2019 °1350  °AST 35  °ALT 16  °ALKPHOS 81  °BILITOT 1.1  °PROT 7.5  °ALBUMIN 3.6  ° ° °   °Cultures: °   °Component Value Date/Time  ° SDES LEG RIGHT CHECK FOR MRSA 08/11/2014 1747  ° SPECREQUEST NONE 08/11/2014 1747  ° CULT  08/11/2014 1747  °  MULTIPLE ORGANISMS PRESENT, NONE PREDOMINANT °Note: NO STAPHYLOCOCCUS AUREUS ISOLATED NO GROUP A STREP (S.PYOGENES) ISOLATED °Performed at [facility] °  ° REPTSTATUS 08/14/2014 FINAL 08/11/2014 1747  ° °  °Radiological Exams on Admission: °Ct Chest W Contrast ° °Result Date: 04/06/2019 °CLINICAL DATA:  Shortness of breath and chest pain. History of basal cell carcinoma. EXAM: CT CHEST WITH CONTRAST TECHNIQUE: Multidetector CT imaging of the chest was performed during intravenous contrast administration. CONTRAST:  75mL OMNIPAQUE IOHEXOL 300 MG/ML  SOLN COMPARISON:  01/08/2019.  PET-CT dated 03/01/2019 FINDINGS: Cardiovascular: The heart size is stable from prior study. The main pulmonary artery is dilated measuring approximately 3.6 cm in diameter. There is no aortic aneurysm. No thoracic aortic dissection. Mediastinum/Nodes:  --there are new pathologically enlarged mediastinal lymph nodes measuring up to approximately 2.3 cm in the short axis. There are pathologically enlarged left hilar lymph nodes. --No axillary lymphadenopathy. --No supraclavicular lymphadenopathy. --Normal thyroid gland. --The esophagus is unremarkable Lungs/Pleura: There is new near complete collapse of the left upper lobe. There is consolidation at the left lung apex which may represent postobstructive pneumonia. The left upper lobe bronchus appears to be occluded. There is significant narrowing of the left lower lobe bronchus. There is some consolidation involving the left lower lobe with a small left-sided pleural effusion. The previously demonstrated left upper lobe lung mass appears to have substantially increased in size but is difficult to distinguish from the collapsed lung. An estimate is that the mass mass measures at least 9.2 x 7.6 cm. There are mild emphysematous changes in the right lung field. There is a growing spiculated 1 cm pulmonary nodule in the right upper lobe (axial series 5, image 54). There is a significant amount of debris within the trachea and right mainstem bronchus. There is a new pulmonary opacity in the anterior right upper lobe measuring approximately 1.7 cm (axial series 5, image 62). There is some atelectasis versus consolidation involving the right lower lobe. Upper Abdomen: There are new hypoattenuating masses in the liver measuring 1.9 cm in hepatic segment 4A and 1.7 cm in hepatic segment 4 B. Musculoskeletal: There is new destruction of the posterior fifth and sixth ribs on the left consistent with osseous involvement of the known lung mass. IMPRESSION: 1. New near complete collapse of the left upper lobe felt to be secondary to progression of the patient's known left upper lobe lung mass, compressing the left upper lobe bronchus. 2. Findings consistent with significant interval progression of disease as evidenced by new  pathologically enlarged mediastinal lymph nodes as well as multiple new low-attenuation masses in the liver concerning for hepatic metastatic disease. In addition, there are new pulmonary opacities in the right upper and right lower lobes raising concern for additional foci of metastatic disease. 3. Consolidation involving the left lung apex concerning for postobstructive pneumonia. 4. Moderate amount of debris within the trachea and bronchi bilaterally. 5. Significant narrowing of the left lower   lobe bronchus. There is consolidation involving the left lower lobe with a small left-sided pleural effusion. 6. New osseous involvement involving the posterior fifth and sixth ribs on the left. Electronically Signed   By: Christopher  Green M.D.   On: 04/16/2019 17:44  ° °Dg Chest Port 1 View ° °Result Date: 04/13/2019 °CLINICAL DATA:  Patient reports to the ED with complaint of SOB and chest pain. Patient has a hx of lymphoma and reports he has been receiving care. [name] legs have an odor and his skin is dry and flaky EXAM: PORTABLE CHEST 1 VIEW COMPARISON:  10/27/2018.  PET-CT, 03/01/2019. FINDINGS: Near complete opacification of the left hemithorax. This has developed since prior chest radiograph, and since the more recent prior PET-CT. The opacification is consistent combination of the posterior left upper lobe hypermetabolic mass noted on the prior PET-CT in combination atelectasis or postobstructive consolidation. Pleural fluid is also suspected. Right lung is hyperexpanded, but otherwise clear. No right pleural effusion. No pneumothorax. IMPRESSION: 1. Significant interval worsening left lung aeration. Left hemithorax is now mostly opacified consistent with a combination of the presumed left upper lobe malignancy with postobstructive change/atelectasis and probable pleural fluid. Electronically Signed   By: David  Ormond M.D.   On: 03/20/2019 14:25  ° ° °Chart has been reviewed ° ° ° °Assessment/Plan ° ° 63 y.o.  male with medical history significant of  COPD, lung mass  °Bilateral lymphedema PSVT.  History of squamous cell carcinoma of the skin, basal cell carcinoma of the skin.  History of drug abuse in the past Admitted for obstructive pneumonia likely secondary to mass ° °Present on Admission: °• Postobstructive pneumonia - broad spectrum Antibiotic coverage with zosyn for now °Obtain sputum cultures °Given hypoxia  ° °• COPD GOLD III - chronic continue home medications, wheezing currently most likely secondary to postobstructive pneumonia.  Patient is unsure when was his last dose of steroids will defer to pulmonology if they think that use of steroids would be beneficial in his situation. ° °• Bilateral lower extremity edema - chronic due to venous stasis changes °Wound care consult ordered ° °• Lung mass - discussed with pulmonology will need biopsy of the mass Ct guided or bronchoscopy if able to tolerate °Appreciate pulmonology consult ° °• Multiple open wounds of lower leg - wound consult ordered ° °• Hypercalcemia -  - we will initiate IV fluids for rehydration.  °Administer a dose of zoledronic acid °Given   Ca level  >14 will add calcitonin for the 4 total doses or less °Continue to follow serial calcium levels and renal function °Once patient rehydrated if calcium still elevated can attempt to use diuretics °  Check PTH and PTH related peptide level °likely hypercalcemia of malignancy ° ° °Other plan as per orders. ° °DVT prophylaxis:  SCD     ° °Code Status:  FULL CODE  as per patient  °I had personally discussed CODE STATUS with patient   ° °Family Communication:   Family not at  Bedside °  °Disposition Plan:    To home once workup is complete and patient is stable  ° °                   Would benefit from PT/OT eval prior to DC  Ordered °                  Wound consult °                   °  Consults called:  PULMONOLOGY aware spoke to care link request consult in AM ° ° °Admission status:  °ED Disposition   °  ED Disposition Condition Comment  ° Admit  The patient appears reasonably stabilized for admission considering the current resources, flow, and capabilities available in the ED at this time, and I doubt any other EMC requiring further screening and/or treatment in the ED prior to admission is  present. °  °  ° °   inpatient    ° °Expect 2 midnight stay secondary to severity of patient's current illness including  ° hemodynamic instability despite optimal treatment (tachycardia  tachypnea  hypoxia,  ) ° Severe lab/radiological/exam abnormalities including:   ° left lung postobstructive pneumonia with a mass present ° °and extensive comorbidities including: °  COPD/asthma malignancy, °  °That are currently affecting medical management. ° ° °I expect  patient to be hospitalized for 2 midnights requiring inpatient medical care. ° °Patient is at high risk for adverse outcome (such as loss of life or disability) if not treated. ° °Indication for inpatient stay as follows:  °  °Hemodynamic instability despite maximal medical therapy,  °  °Need for operative/procedural  intervention °New or worsening hypoxia ° °Need for IV antibiotics, IV fluids  °  ° ° Level of care         SDU tele indefinitely please discontinue once patient no longer qualifies ° Precautions:  °Airborne and Contact precautions ° °PPE: Used by the provider:  ° P100  °eye Goggles,  °Gloves °  °  ° °  °04/13/2019, 10:30 PM   ° °[facility]  °  ° °after 2 AM please page floor coverage PA °If 7AM-7PM, please contact the day team taking care of the patient using °[email or web address]  °  ° °

## 2019-04-12 NOTE — Progress Notes (Signed)
eLink Physician-Brief Progress Note Patient Name: Omar Wagner DOB: 1956/01/11 MRN: [id number]   Date of Service  04/15/2019  HPI/Events of Note  Pt admitted to [facility] for lung mass. [facility] would like Pulmonologist to see patient in a.m. for guidance with further diagnosis and management of his lung mass. Pt is a known patient of PCCM.  eICU Interventions  I added Pt to the list, and left a handoff note for  The a.m. pulmonologist.        Frederik Pear 04/09/2019, 10:35 PM

## 2019-04-12 NOTE — ED Notes (Signed)
Date and time results received: 04/09/2019 3:08 PM  (use smartphrase ".now" to insert current time)  Test: Lactic Acid Critical Value: 2.2  Name of Provider Notified: Dr. Maryan Rued  Orders Received? Or Actions Taken?:

## 2019-04-12 NOTE — ED Notes (Signed)
ED TO INPATIENT HANDOFF REPORT  ED Nurse Name and Phone #:   S Name/Age/Gender Omar Wagner 63 y.o. male Room/Bed: WA04/WA04  Code Status   Code Status: Prior  Home/SNF/Other Home Patient oriented to: self, place, time and situation Is this baseline? Yes   Triage Complete: Triage complete  Chief Complaint shortness of breath  Triage Note Patient reports to the ED with complaint of SOB and chest pain. Patient has a hx of lymphoma and reports he has been receiving care. [name] legs have an odor and his skin is dry and flaky.    Allergies No Known Allergies  Level of Care/Admitting Diagnosis ED Disposition    ED Disposition Condition Comment   Admit  Hospital Area: [facility] [[id number]]  Level of Care: Stepdown [14]  Admit to SDU based on following criteria: Respiratory Distress:  Frequent assessment and/or intervention to maintain adequate ventilation/respiration, pulmonary toilet, and respiratory treatment.  Covid Evaluation: Symptomatic Person Under Investigation (PUI)  Diagnosis: Postobstructive pneumonia [620355]  Admitting Physician: Toy Baker [[id number]]  Attending Physician: Toy Baker [[id number]]  Estimated length of stay: 3 - 4 days  Certification:: I certify this patient will need inpatient services for at least 2 midnights  PT Class (Do Not Modify): Inpatient [101]  PT Acc Code (Do Not Modify): Private [1]       B Medical/Surgery History Past Medical History:  Diagnosis Date  . Basal cell carcinoma   . COPD (chronic obstructive pulmonary disease) ([place])   . Depression    Past Surgical History:  Procedure Laterality Date  . HERNIA REPAIR       A IV Location/Drains/Wounds Patient Lines/Drains/Airways Status   Active Line/Drains/Airways    Name:   Placement date:   Placement time:   Site:   Days:   Peripheral IV 03/24/2019 Left Arm   04/06/2019    -    Arm   less than 1   Wound / Incision (Open or Dehisced) 08/10/14  Other (Comment) Leg Right;Posterior red pimple   08/10/14    2043    Leg   1706          Intake/Output Last 24 hours No intake or output data in the 24 hours ending 04/13/2019 2100  Labs/Imaging Results for orders placed or performed during the hospital encounter of 03/26/2019 (from the past 48 hour(s))  Comprehensive metabolic panel     Status: Abnormal   Collection Time: 04/11/2019  1:50 PM  Result Value Ref Range   Sodium 143 135 - 145 mmol/L   Potassium 3.7 3.5 - 5.1 mmol/L   Chloride 104 98 - 111 mmol/L   CO2 29 22 - 32 mmol/L   Glucose, Bld 104 (H) 70 - 99 mg/dL   BUN 20 8 - 23 mg/dL   Creatinine, Ser 1.02 0.61 - 1.24 mg/dL   Calcium 14.1 (HH) 8.9 - 10.3 mg/dL    Comment: CRITICAL RESULT CALLED TO, READ BACK BY AND VERIFIED WITH: T.SMITH,RN [id number] _0  BY V.WILKINS    Total Protein 7.5 6.5 - 8.1 g/dL   Albumin 3.6 3.5 - 5.0 g/dL   AST 35 15 - 41 U/L   ALT 16 0 - 44 U/L   Alkaline Phosphatase 81 38 - 126 U/L   Total Bilirubin 1.1 0.3 - 1.2 mg/dL   GFR calc non Af Amer >60 >60 mL/min   GFR calc Af Amer >60 >60 mL/min   Anion gap 10 5 - 15    Comment: Performed at  [facility], [place] [address] [place], [place] [address]  Lactate     Status: Abnormal   Collection Time: 04/15/2019  1:50 PM  Result Value Ref Range   Lactic Acid, Venous 2.2 (HH) 0.5 - 1.9 mmol/L    Comment: T.SMITH,RN [id number] _0  BY V.WILKINS Performed at [facility] [address], [place], [place] [address]   CBC with Differential/Platelet     Status: Abnormal   Collection Time: 03/18/2019  2:41 PM  Result Value Ref Range   WBC 8.1 4.0 - 10.5 K/uL   RBC 4.09 (L) 4.22 - 5.81 MIL/uL   Hemoglobin 14.7 13.0 - 17.0 g/dL   HCT 45.6 39.0 - 52.0 %   MCV 111.5 (H) 80.0 - 100.0 fL   MCH 35.9 (H) 26.0 - 34.0 pg   MCHC 32.2 30.0 - 36.0 g/dL   RDW 12.3 11.5 - 15.5 %   Platelets 153 150 - 400 K/uL   nRBC 0.0 0.0 - 0.2 %   Neutrophils Relative % 85 %   Neutro Abs 6.8 1.7 - 7.7  K/uL   Lymphocytes Relative 9 %   Lymphs Abs 0.7 0.7 - 4.0 K/uL   Monocytes Relative 6 %   Monocytes Absolute 0.5 0.1 - 1.0 K/uL   Eosinophils Relative 0 %   Eosinophils Absolute 0.0 0.0 - 0.5 K/uL   Basophils Relative 0 %   Basophils Absolute 0.0 0.0 - 0.1 K/uL   Immature Granulocytes 0 %   Abs Immature Granulocytes 0.02 0.00 - 0.07 K/uL    Comment: Performed at [facility], [place] [address], [place], [place] [address]  POC SARS Coronavirus 2 Ag-ED - Nasal Swab (BD Veritor Kit)     Status: None   Collection Time: 04/08/2019  2:48 PM  Result Value Ref Range   SARS Coronavirus 2 Ag NEGATIVE NEGATIVE    Comment: (NOTE) SARS-CoV-2 antigen NOT DETECTED.  Negative results are presumptive.  Negative results do not preclude SARS-CoV-2 infection and should not be used as the sole basis for treatment or other patient management decisions, including infection  control decisions, particularly in the presence of clinical signs and  symptoms consistent with COVID-19, or in those who have been in contact with the virus.  Negative results must be combined with clinical observations, patient history, and epidemiological information. The expected result is Negative. Fact Sheet for Patients: PodPark.tn Fact Sheet for Healthcare Providers: GiftContent.is This test is not yet approved or cleared by the [place] FDA and  has been authorized for detection and/or diagnosis of SARS-CoV-2 by FDA under an Emergency Use Authorization (EUA).  This EUA will remain in effect (meaning this test can be used) for the duration of  the COVID-19 de claration under Section 564(b)(1) of the Act, 21 U.S.C. section 360bbb-3(b)(1), unless the authorization is terminated or revoked sooner.   Brain natriuretic peptide     Status: None   Collection Time: 04/15/2019  2:58 PM  Result Value Ref Range   B Natriuretic Peptide 84.3 0.0 - 100.0 pg/mL     Comment: Performed at [facility], [place] [address], [place], [place] [address]  Blood gas, venous (at [facility] and [facility], not at [facility])     Status: Abnormal   Collection Time: 04/15/2019  3:17 PM  Result Value Ref Range   pH, Ven 7.4 7.250 - 7.430   pCO2, Ven 48.8 44.0 - 60.0 mmHg   pO2, Ven 38.3 32.0 - 45.0 mmHg  Bicarbonate 31.9 (H) 20.0 - 28.0 mmol/L   Acid-Base Excess 6.7 (H) 0.0 - 2.0 mmol/L   O2 Saturation 69.2 %   Patient temperature 98.6     Comment: Performed at [facility], [place] [address], [place], [place] [address]   Ct Chest W Contrast  Result Date: 03/18/2019 CLINICAL DATA:  Shortness of breath and chest pain. History of basal cell carcinoma. EXAM: CT CHEST WITH CONTRAST TECHNIQUE: Multidetector CT imaging of the chest was performed during intravenous contrast administration. CONTRAST:  30m OMNIPAQUE IOHEXOL 300 MG/ML  SOLN COMPARISON:  01/08/2019.  PET-CT dated 03/01/2019 FINDINGS: Cardiovascular: The heart size is stable from prior study. The main pulmonary artery is dilated measuring approximately 3.6 cm in diameter. There is no aortic aneurysm. No thoracic aortic dissection. Mediastinum/Nodes: --there are new pathologically enlarged mediastinal lymph nodes measuring up to approximately 2.3 cm in the short axis. There are pathologically enlarged left hilar lymph nodes. --No axillary lymphadenopathy. --No supraclavicular lymphadenopathy. --Normal thyroid gland. --The esophagus is unremarkable Lungs/Pleura: There is new near complete collapse of the left upper lobe. There is consolidation at the left lung apex which may represent postobstructive pneumonia. The left upper lobe bronchus appears to be occluded. There is significant narrowing of the left lower lobe bronchus. There is some consolidation involving the left lower lobe with a small left-sided pleural effusion. The previously demonstrated left upper lobe lung mass appears to have substantially  increased in size but is difficult to distinguish from the collapsed lung. An estimate is that the mass mass measures at least 9.2 x 7.6 cm. There are mild emphysematous changes in the right lung field. There is a growing spiculated 1 cm pulmonary nodule in the right upper lobe (axial series 5, image 54). There is a significant amount of debris within the trachea and right mainstem bronchus. There is a new pulmonary opacity in the anterior right upper lobe measuring approximately 1.7 cm (axial series 5, image 62). There is some atelectasis versus consolidation involving the right lower lobe. Upper Abdomen: There are new hypoattenuating masses in the liver measuring 1.9 cm in hepatic segment 4A and 1.7 cm in hepatic segment 4 B. Musculoskeletal: There is new destruction of the posterior fifth and sixth ribs on the left consistent with osseous involvement of the known lung mass. IMPRESSION: 1. New near complete collapse of the left upper lobe felt to be secondary to progression of the patient's known left upper lobe lung mass, compressing the left upper lobe bronchus. 2. Findings consistent with significant interval progression of disease as evidenced by new pathologically enlarged mediastinal lymph nodes as well as multiple new low-attenuation masses in the liver concerning for hepatic metastatic disease. In addition, there are new pulmonary opacities in the right upper and right lower lobes raising concern for additional foci of metastatic disease. 3. Consolidation involving the left lung apex concerning for postobstructive pneumonia. 4. Moderate amount of debris within the trachea and bronchi bilaterally. 5. Significant narrowing of the left lower lobe bronchus. There is consolidation involving the left lower lobe with a small left-sided pleural effusion. 6. New osseous involvement involving the posterior fifth and sixth ribs on the left. Electronically Signed   By: CConstance HolsterM.D.   On: 04/16/2019 17:44    Dg Chest Port 1 View  Result Date: 03/24/2019 CLINICAL DATA:  Patient reports to the ED with complaint of SOB and chest pain. Patient has a hx of lymphoma and reports he has been receiving care. [name] legs  have an odor and his skin is dry and flaky EXAM: PORTABLE CHEST 1 VIEW COMPARISON:  10/27/2018.  PET-CT, 03/01/2019. FINDINGS: Near complete opacification of the left hemithorax. This has developed since prior chest radiograph, and since the more recent prior PET-CT. The opacification is consistent combination of the posterior left upper lobe hypermetabolic mass noted on the prior PET-CT in combination atelectasis or postobstructive consolidation. Pleural fluid is also suspected. Right lung is hyperexpanded, but otherwise clear. No right pleural effusion. No pneumothorax. IMPRESSION: 1. Significant interval worsening left lung aeration. Left hemithorax is now mostly opacified consistent with a combination of the presumed left upper lobe malignancy with postobstructive change/atelectasis and probable pleural fluid. Electronically Signed   By: Lajean Manes M.D.   On: 03/22/2019 14:25    Pending Labs Unresulted Labs (From admission, onward)    Start     Ordered   03/28/2019 2005  Parathyroid hormone, intact (no Ca)  Add-on,   AD     03/18/2019 2004   04/01/2019 2005  PTH-related peptide  Add-on,   AD     03/27/2019 2004   04/03/2019 1506  SARS CORONAVIRUS 2 (TAT 6-24 HRS) Nasopharyngeal Nasopharyngeal Swab  (Asymptomatic/Tier 3)  Once,   STAT    Question Answer Comment  Is this test for diagnosis or screening Screening   Symptomatic for COVID-19 as defined by CDC No   Hospitalized for COVID-19 No   Admitted to ICU for COVID-19 No   Previously tested for COVID-19 Yes   Resident in a congregate (group) care setting No   Employed in healthcare setting No      04/07/2019 1505   04/05/2019 1417  Blood culture (routine x 2)  BLOOD CULTURE X 2,   STAT     03/26/2019 1416   03/18/2019 1350  CBC with  Differential  Once,   STAT     03/29/2019 1351   Signed and Held  HIV Antibody (routine testing w rflx)  (HIV Antibody (Routine testing w reflex) panel)  Tomorrow morning,   R     Signed and Held   Signed and Held  Magnesium  Tomorrow morning,   R    Comments: Call MD if <1.5    Signed and Held   Signed and Held  Phosphorus  Tomorrow morning,   R     Signed and Held   Signed and Held  TSH  Once,   R    Comments: Cancel if already done within 1 month and notify MD    Signed and Held   Signed and Held  Comprehensive metabolic panel  Once,   R    Comments: Cal MD for K<3.5 or >5.0    Signed and Held   Signed and Held  CBC  Once,   R    Comments: Call for hg <8.0    Signed and Held   Signed and Held  Strep pneumoniae urinary antigen  Once,   R     Signed and Held          Vitals/Pain Today's Vitals   03/23/2019 1921 04/07/2019 1930 04/11/2019 2000 03/28/2019 2030  BP:  (!) 153/96 (!) 156/133 (!) 156/89  Pulse: (!) 111 (!) 115 (!) 115 (!) 112  Resp: (!) 24 (!) 28 (!) 25 (!) 24  Temp:      TempSrc:      SpO2: 96% 96% 96% 97%  PainSc:        Isolation Precautions Airborne and Contact precautions  Medications Medications  sodium chloride (PF) 0.9 % injection (has no administration in time range)  vancomycin (VANCOCIN) 1,750 mg in sodium chloride 0.9 % 500 mL IVPB (1,750 mg Intravenous New Bag/Given 04/08/2019 1939)  0.9 %  sodium chloride infusion (has no administration in time range)  calcitonin (MIACALCIN) injection 400 Units (has no administration in time range)  zolendronic acid (ZOMETA) 4 mg in sodium chloride 0.9 % 100 mL IVPB (has no administration in time range)  0.9 %  sodium chloride infusion (has no administration in time range)  piperacillin-tazobactam (ZOSYN) IVPB 3.375 g (has no administration in time range)  methylPREDNISolone sodium succinate (SOLU-MEDROL) 125 mg/2 mL injection 125 mg (125 mg Intravenous Given 04/11/2019 1536)  magnesium sulfate IVPB 2 g 50 mL (0 g  Intravenous Stopped 03/22/2019 1556)  albuterol (VENTOLIN HFA) 108 (90 Base) MCG/ACT inhaler 8 puff (8 puffs Inhalation Given 03/29/2019 1425)  ipratropium (ATROVENT HFA) inhaler 4 puff (4 puffs Inhalation Given 03/27/2019 1554)  iohexol (OMNIPAQUE) 300 MG/ML solution 75 mL (75 mLs Intravenous Contrast Given 04/03/2019 1715)  ceFEPIme (MAXIPIME) 2 g in sodium chloride 0.9 % 100 mL IVPB (0 g Intravenous Stopped 04/11/2019 1940)    Mobility    Focused Assessments    R Recommendations: See Admitting Provider Note  Report given to:   Additional Notes:

## 2019-04-12 NOTE — ED Provider Notes (Signed)
[facility] DEPT Provider Note   CSN: [id number] Arrival date & time: 03/23/2019  1331     History   Chief Complaint Chief Complaint  Patient presents with  . Shortness of Breath    HPI Omar Wagner is a 63 y.o. male.     Patient is a 63 year old male with a history of COPD, substance abuse including heroin, hypokalemia, lymphedema and lower extremity cellulitis who is presenting today with shortness of breath.  He said for the last week he has had a cough that is been intermittently productive of sputum but over the last 2 to 3 days he started feeling very short of breath.  He denies any fever, chest pain, abdominal pain, nausea or vomiting.  No orthopnea but does have dyspnea with exertion.  He does not use oxygen at home and states that he has stopped smoking.  However with the last pulmonology met note within the last month they were still discussing smoke cessation.  Patient has increased his prednisone in the last week due to shortness of breath and continues to use his inhalers at home which do help some but he continues to get worse.  He states the chronic swelling in his lower legs is about the same as usual.  He denies history of cardiac disease or diabetes.  No known Covid exposure.  The history is provided by the patient.  Shortness of Breath Severity:  Severe Onset quality:  Gradual Duration:  3 days Timing:  Constant Progression:  Worsening Chronicity:  Recurrent   Past Medical History:  Diagnosis Date  . Basal cell carcinoma   . COPD (chronic obstructive pulmonary disease) ([place])   . Depression     Patient Active Problem List   Diagnosis Date Noted  . Pulmonary nodule, left 10/31/2018  . Cigarette smoker 05/10/2018  . Bilateral lower extremity edema 12/07/2017  . Elevated blood pressure reading 12/07/2017  . History of elevated blood pressure while in hospital 12/07/2017  . COPD GOLD III  08/02/2016  . Cellulitis and abscess  of leg 08/10/2014  . Bacteremia 01/17/2013  . Heroin withdrawal ([place]) 01/14/2013  . Acute diarrhea 01/13/2013  . Hypokalemia 01/13/2013  . Aspiration into airway 01/12/2013  . Substance abuse ([place]) 01/12/2013  . Acute encephalopathy 01/11/2013  . Cellulitis of multiple sites of lower extremity 01/11/2013    Past Surgical History:  Procedure Laterality Date  . HERNIA REPAIR          Home Medications    Prior to Admission medications   Medication Sig Start Date End Date Taking? Authorizing Provider  albuterol (ACCUNEB) 0.63 MG/3ML nebulizer solution Take 3 mLs (0.63 mg total) by nebulization every 6 (six) hours as needed for wheezing or shortness of breath. 03/14/19   Tanda Rockers, MD  albuterol (VENTOLIN HFA) 108 (90 Base) MCG/ACT inhaler Inhale 2 puffs into the lungs every 4 (four) hours as needed for wheezing or shortness of breath. 03/14/19   Tanda Rockers, MD  Cyanocobalamin (VITAMIN B-12 PO) Take 1 tablet by mouth daily.     [provider]  ibuprofen (ADVIL) 200 MG tablet Take 800 mg by mouth every 6 (six) hours as needed for mild pain.    [provider]  metoprolol tartrate (LOPRESSOR) 25 MG tablet Take 1 tablet (25 mg total) by mouth 2 (two) times daily. 12/07/17   Martyn Ehrich, NP  Multiple Vitamin (MULITIVITAMIN WITH MINERALS) TABS Take 1 tablet by mouth daily.    [provider]  predniSONE (DELTASONE) 10 MG tablet Take  4 each am x 2 days,   2 each am x 2 days,  1 each am x 2 days and stop 03/29/19   Tanda Rockers, MD    Family History Family History  Problem Relation Age of Onset  . Cancer Other   . Breast cancer Mother   . Colon cancer Father   . Breast cancer Sister   . Liver cancer Brother     Social History Social History   Tobacco Use  . Smoking status: Current Some Day Smoker    Packs/day: 1.00    Years: 30.00    Pack years: 30.00    Types: Cigarettes  . Smokeless tobacco: Never Used  Substance Use Topics   . Alcohol use: Yes    Comment: social  . Drug use: Yes    Types: Heroin, IV    Comment: heroin  last year     Allergies   Patient has no known allergies.   Review of Systems Review of Systems  Respiratory: Positive for shortness of breath.   All other systems reviewed and are negative.    Physical Exam Updated Vital Signs BP (!) 154/82 (BP Location: Left Arm)   Pulse 94   Temp 98 F (36.7 C) (Oral)   Resp 18   SpO2 92%   Physical Exam Vitals signs and nursing note reviewed.  Constitutional:      Appearance: He is well-developed. He is obese.     Comments: Disheveled  HENT:     Head: Normocephalic and atraumatic.     Mouth/Throat:     Mouth: Mucous membranes are dry.  Eyes:     Conjunctiva/sclera: Conjunctivae normal.     Pupils: Pupils are equal, round, and reactive to light.  Neck:     Musculoskeletal: Normal range of motion and neck supple.  Cardiovascular:     Rate and Rhythm: Regular rhythm. Tachycardia present.     Pulses: Normal pulses.     Heart sounds: No murmur.  Pulmonary:     Effort: Tachypnea and accessory muscle usage present. No respiratory distress.     Breath sounds: Wheezing and rhonchi present. No rales.  Abdominal:     General: There is distension.     Palpations: Abdomen is soft.     Tenderness: There is no abdominal tenderness. There is no guarding or rebound.  Musculoskeletal: Normal range of motion.        General: No tenderness.     Right lower leg: Edema present.     Left lower leg: Edema present.     Comments: Feet with mild draining wounds bilaterally and significant lower extremity edema below the knee.  Significant edema present where his Ace wrapped bandages stop.  Erythema noted but not warm or tender  Skin:    General: Skin is warm and dry.     Capillary Refill: Capillary refill takes less than 2 seconds.     Findings: No erythema or rash.  Neurological:     General: No focal deficit present.     Mental Status: He is  alert and oriented to person, place, and time. Mental status is at baseline.  Psychiatric:        Mood and Affect: Mood normal.        Behavior: Behavior normal.        Thought Content: Thought content normal.      ED Treatments / Results  Labs (all labs ordered  are listed, but only abnormal results are displayed) Labs Reviewed  COMPREHENSIVE METABOLIC PANEL - Abnormal; Notable for the following components:      Result Value   Glucose, Bld 104 (*)    Calcium 14.1 (*)    All other components within normal limits  LACTIC ACID, PLASMA - Abnormal; Notable for the following components:   Lactic Acid, Venous 2.2 (*)    All other components within normal limits  CBC WITH DIFFERENTIAL/PLATELET - Abnormal; Notable for the following components:   RBC 4.09 (*)    MCV 111.5 (*)    MCH 35.9 (*)    All other components within normal limits  BLOOD GAS, VENOUS - Abnormal; Notable for the following components:   Bicarbonate 31.9 (*)    Acid-Base Excess 6.7 (*)    All other components within normal limits  CULTURE, BLOOD (ROUTINE X 2)  CULTURE, BLOOD (ROUTINE X 2)  SARS CORONAVIRUS 2 (TAT 6-24 HRS)  BRAIN NATRIURETIC PEPTIDE  CBC WITH DIFFERENTIAL/PLATELET  POC SARS CORONAVIRUS 2 AG -  ED    EKG EKG Interpretation  Date/Time:  Thursday April 12 2019 13:50:13 EST Ventricular Rate:  90 PR Interval:    QRS Duration: 82 QT Interval:  376 QTC Calculation: 426 R Axis:   81 Text Interpretation: Sinus rhythm Multiform ventricular premature complexes Borderline right axis deviation Borderline repolarization abnormality No significant change since last tracing Confirmed by Blanchie Dessert ([id number]) on 04/04/2019 1:53:33 PM   Radiology Ct Chest W Contrast  Result Date: 04/08/2019 CLINICAL DATA:  Shortness of breath and chest pain. History of basal cell carcinoma. EXAM: CT CHEST WITH CONTRAST TECHNIQUE: Multidetector CT imaging of the chest was performed during intravenous contrast  administration. CONTRAST:  36m OMNIPAQUE IOHEXOL 300 MG/ML  SOLN COMPARISON:  01/08/2019.  PET-CT dated 03/01/2019 FINDINGS: Cardiovascular: The heart size is stable from prior study. The main pulmonary artery is dilated measuring approximately 3.6 cm in diameter. There is no aortic aneurysm. No thoracic aortic dissection. Mediastinum/Nodes: --there are new pathologically enlarged mediastinal lymph nodes measuring up to approximately 2.3 cm in the short axis. There are pathologically enlarged left hilar lymph nodes. --No axillary lymphadenopathy. --No supraclavicular lymphadenopathy. --Normal thyroid gland. --The esophagus is unremarkable Lungs/Pleura: There is new near complete collapse of the left upper lobe. There is consolidation at the left lung apex which may represent postobstructive pneumonia. The left upper lobe bronchus appears to be occluded. There is significant narrowing of the left lower lobe bronchus. There is some consolidation involving the left lower lobe with a small left-sided pleural effusion. The previously demonstrated left upper lobe lung mass appears to have substantially increased in size but is difficult to distinguish from the collapsed lung. An estimate is that the mass mass measures at least 9.2 x 7.6 cm. There are mild emphysematous changes in the right lung field. There is a growing spiculated 1 cm pulmonary nodule in the right upper lobe (axial series 5, image 54). There is a significant amount of debris within the trachea and right mainstem bronchus. There is a new pulmonary opacity in the anterior right upper lobe measuring approximately 1.7 cm (axial series 5, image 62). There is some atelectasis versus consolidation involving the right lower lobe. Upper Abdomen: There are new hypoattenuating masses in the liver measuring 1.9 cm in hepatic segment 4A and 1.7 cm in hepatic segment 4 B. Musculoskeletal: There is new destruction of the posterior fifth and sixth ribs on the left  consistent with osseous involvement of the  known lung mass. IMPRESSION: 1. New near complete collapse of the left upper lobe felt to be secondary to progression of the patient's known left upper lobe lung mass, compressing the left upper lobe bronchus. 2. Findings consistent with significant interval progression of disease as evidenced by new pathologically enlarged mediastinal lymph nodes as well as multiple new low-attenuation masses in the liver concerning for hepatic metastatic disease. In addition, there are new pulmonary opacities in the right upper and right lower lobes raising concern for additional foci of metastatic disease. 3. Consolidation involving the left lung apex concerning for postobstructive pneumonia. 4. Moderate amount of debris within the trachea and bronchi bilaterally. 5. Significant narrowing of the left lower lobe bronchus. There is consolidation involving the left lower lobe with a small left-sided pleural effusion. 6. New osseous involvement involving the posterior fifth and sixth ribs on the left. Electronically Signed   By: Constance Holster M.D.   On: 03/20/2019 17:44   Dg Chest Port 1 View  Result Date: 04/10/2019 CLINICAL DATA:  Patient reports to the ED with complaint of SOB and chest pain. Patient has a hx of lymphoma and reports he has been receiving care. [name] legs have an odor and his skin is dry and flaky EXAM: PORTABLE CHEST 1 VIEW COMPARISON:  10/27/2018.  PET-CT, 03/01/2019. FINDINGS: Near complete opacification of the left hemithorax. This has developed since prior chest radiograph, and since the more recent prior PET-CT. The opacification is consistent combination of the posterior left upper lobe hypermetabolic mass noted on the prior PET-CT in combination atelectasis or postobstructive consolidation. Pleural fluid is also suspected. Right lung is hyperexpanded, but otherwise clear. No right pleural effusion. No pneumothorax. IMPRESSION: 1. Significant interval  worsening left lung aeration. Left hemithorax is now mostly opacified consistent with a combination of the presumed left upper lobe malignancy with postobstructive change/atelectasis and probable pleural fluid. Electronically Signed   By: Lajean Manes M.D.   On: 03/18/2019 14:25    Procedures Procedures (including critical care time)  Medications Ordered in ED Medications  methylPREDNISolone sodium succinate (SOLU-MEDROL) 125 mg/2 mL injection 125 mg (has no administration in time range)  magnesium sulfate IVPB 2 g 50 mL (has no administration in time range)  albuterol (VENTOLIN HFA) 108 (90 Base) MCG/ACT inhaler 8 puff (has no administration in time range)  ipratropium (ATROVENT HFA) inhaler 4 puff (has no administration in time range)     Initial Impression / Assessment and Plan / ED Course  I have reviewed the triage vital signs and the nursing notes.  Pertinent labs & imaging results that were available during my care of the patient were reviewed by me and considered in my medical decision making (see chart for details).        Elderly male with multiple medical problems presenting today with shortness of breath.  Symptoms could be related to COPD exacerbation versus Covid versus pneumonia versus CHF.  Patient does have significant bilateral lymphedema but low suspicion for cellulitis today as there is no warmth to the redness in his legs or tenderness.  He states they look like this most of the time and he has been seeing wound care  regularly.  He has had new productive cough but denies fever.  He is satting 87% on room air requiring 4 L of oxygen to maintain sats in the low 90s.  He is able to speak in short sentences and is awake and alert.  He denies any chest pain or abdominal pain.  Labs and x-ray are pending.  Patient given albuterol, Atrovent, Solu-Medrol and magnesium.  3:36 PM [name] rapid Covid was negative and CMP shows hypercalcemia of 14 but otherwise all the rest is  normal.  Lactic acid of 2.2.  Chest x-ray with significant interval worsening in the left lung aeration left hemithorax is now mostly opacified consistent with a combination of presumed left upper lobe malignancy with postobstructive changes versus pleural fluid.  CBC is still pending.  We will do CT to further evaluate.  Patient remains on 4 L of oxygen with oxygen saturation in the low 90s.  Work of breathing has improved.  5:48 PM Patient remains 90 to 93% on 4 L.  CT of the chest shows new nearly complete collapse of the left upper lobe felt to be secondary to progression of patient's mass compressing the left upper bronchus.  There is new pathologic enlarged lymph nodes multiple new low-attenuation masses in the liver concerning for metastatic disease as well as consolidation in the left lung apex concerning for postobstructive pneumonia.  Patient was covered with cefepime and vanc.  He will need admission to the hospital.  He will need bronchoscopy.  Will discuss with hospitalist.  Longer Covid is pending.  CRITICAL CARE Performed by: Cavan Bearden Total critical care time: 30 minutes Critical care time was exclusive of separately billable procedures and treating other patients. Critical care was necessary to treat or prevent imminent or life-threatening deterioration. Critical care was time spent personally by me on the following activities: development of treatment plan with patient and/or surrogate as well as nursing, discussions with consultants, evaluation of patient's response to treatment, examination of patient, obtaining history from patient or surrogate, ordering and performing treatments and interventions, ordering and review of laboratory studies, ordering and review of radiographic studies, pulse oximetry and re-evaluation of patient's condition.   Final Clinical Impressions(s) / ED Diagnoses   Final diagnoses:  Mass of left lung  Post-obstructive pneumonia due to foreign body  aspiration    ED Discharge Orders    None       Blanchie Dessert, MD 04/01/2019 1840

## 2019-04-12 NOTE — Progress Notes (Signed)
A consult was received from an ED physician for vancomycin per pharmacy dosing.  The patient's profile has been reviewed for ht/wt/allergies/indication/available labs.   A one time order has been placed for vancomycin 1750 mg and cefepime 2 gm.   Further antibiotics/pharmacy consults should be ordered by admitting physician if indicated.                       Thank you,  Eudelia Bunch, Pharm.D [phone] 04/14/2019 6:34 PM

## 2019-04-12 NOTE — ED Triage Notes (Signed)
Patient reports to the ED with complaint of SOB and chest pain. Patient has a hx of lymphoma and reports he has been receiving care. [name] legs have an odor and his skin is dry and flaky.

## 2019-04-12 NOTE — Progress Notes (Signed)
Pharmacy Antibiotic Note  Omar Wagner is a 63 y.o. male admitted on 04/02/2019 with pneumonia.  Pharmacy has been consulted for zosyn dosing.  Plan: Zosyn 3.375g IV q8h (4 hour infusion).  Pharmacy to sign off    Temp (24hrs), Avg:98 F (36.7 C), Min:98 F (36.7 C), Max:98 F (36.7 C)  Recent Labs  Lab 03/25/2019 1350 04/06/2019 1441  WBC  --  8.1  CREATININE 1.02  --   LATICACIDVEN 2.2*  --     CrCl cannot be calculated (Unknown ideal weight.).    No Known Allergies  Antimicrobials this admission: 11/26 CTX x 1 11/26 azith x 1 11/26 vanc x 1 11/26 cefepime x 1 11/25 zosyn>> Dose adjustments this admission:  Microbiology results: 11/26 Covid: sent 11/26 BCx2: sent  Thank you for allowing pharmacy to be a part of this patient's care.  Eudelia Bunch, Pharm.D [phone] 03/21/2019 8:31 PM

## 2019-04-13 ENCOUNTER — Other Ambulatory Visit: Payer: Self-pay

## 2019-04-13 ENCOUNTER — Encounter (HOSPITAL_COMMUNITY): Payer: Self-pay

## 2019-04-13 ENCOUNTER — Inpatient Hospital Stay (HOSPITAL_COMMUNITY): Payer: Medicare Other

## 2019-04-13 DIAGNOSIS — J9811 Atelectasis: Secondary | ICD-10-CM

## 2019-04-13 DIAGNOSIS — J189 Pneumonia, unspecified organism: Secondary | ICD-10-CM | POA: Diagnosis not present

## 2019-04-13 DIAGNOSIS — R918 Other nonspecific abnormal finding of lung field: Secondary | ICD-10-CM

## 2019-04-13 DIAGNOSIS — J441 Chronic obstructive pulmonary disease with (acute) exacerbation: Secondary | ICD-10-CM | POA: Diagnosis not present

## 2019-04-13 DIAGNOSIS — J9601 Acute respiratory failure with hypoxia: Secondary | ICD-10-CM

## 2019-04-13 LAB — PROTIME-INR
INR: 1.1 (ref 0.8–1.2)
Prothrombin Time: 14.4 seconds (ref 11.4–15.2)

## 2019-04-13 LAB — COMPREHENSIVE METABOLIC PANEL
ALT: 13 U/L (ref 0–44)
AST: 43 U/L — ABNORMAL HIGH (ref 15–41)
Albumin: 3.2 g/dL — ABNORMAL LOW (ref 3.5–5.0)
Alkaline Phosphatase: 68 U/L (ref 38–126)
Anion gap: 12 (ref 5–15)
BUN: 18 mg/dL (ref 8–23)
CO2: 24 mmol/L (ref 22–32)
Calcium: 12.3 mg/dL — ABNORMAL HIGH (ref 8.9–10.3)
Chloride: 104 mmol/L (ref 98–111)
Creatinine, Ser: 1.07 mg/dL (ref 0.61–1.24)
GFR calc Af Amer: >60 mL/min (ref >60)
GFR calc non Af Amer: >60 mL/min (ref >60)
Glucose, Bld: 167 mg/dL — ABNORMAL HIGH (ref 70–99)
Potassium: 4.4 mmol/L (ref 3.5–5.1)
Sodium: 140 mmol/L (ref 135–145)
Total Bilirubin: 1.5 mg/dL — ABNORMAL HIGH (ref 0.3–1.2)
Total Protein: 6.9 g/dL (ref 6.5–8.1)

## 2019-04-13 LAB — CBC
HCT: 47.8 % (ref 39.0–52.0)
Hemoglobin: 15.7 g/dL (ref 13.0–17.0)
MCH: 36.8 pg — ABNORMAL HIGH (ref 26.0–34.0)
MCHC: 32.8 g/dL (ref 30.0–36.0)
MCV: 111.9 fL — ABNORMAL HIGH (ref 80.0–100.0)
Platelets: 146 10*3/uL — ABNORMAL LOW (ref 150–400)
RBC: 4.27 MIL/uL (ref 4.22–5.81)
RDW: 12.5 % (ref 11.5–15.5)
WBC: 7 10*3/uL (ref 4.0–10.5)
nRBC: 0 % (ref 0.0–0.2)

## 2019-04-13 LAB — LACTIC ACID, PLASMA: Lactic Acid, Venous: 2.9 mmol/L (ref 0.5–1.9)

## 2019-04-13 LAB — CALCIUM
Calcium: 12 mg/dL — ABNORMAL HIGH (ref 8.9–10.3)
Calcium: 11.2 mg/dL — ABNORMAL HIGH (ref 8.9–10.3)

## 2019-04-13 LAB — HIV ANTIBODY (ROUTINE TESTING W REFLEX): HIV Screen 4th Generation wRfx: NONREACTIVE

## 2019-04-13 LAB — GLUCOSE, CAPILLARY: Glucose-Capillary: 128 mg/dL — ABNORMAL HIGH (ref 70–99)

## 2019-04-13 LAB — MAGNESIUM: Magnesium: 1.7 mg/dL (ref 1.7–2.4)

## 2019-04-13 LAB — TSH: TSH: 0.179 u[IU]/mL — ABNORMAL LOW (ref 0.350–4.500)

## 2019-04-13 LAB — PHOSPHORUS: Phosphorus: 2.7 mg/dL (ref 2.5–4.6)

## 2019-04-13 LAB — STREP PNEUMONIAE URINARY ANTIGEN: Strep Pneumo Urinary Antigen: NEGATIVE

## 2019-04-13 MED ORDER — FENTANYL CITRATE (PF) 100 MCG/2ML IJ SOLN
INTRAMUSCULAR | Status: AC | PRN
  Administered 2019-04-13 (×2): 50 ug via INTRAVENOUS

## 2019-04-13 MED ORDER — IPRATROPIUM-ALBUTEROL 0.5-2.5 (3) MG/3ML IN SOLN
3.0000 mL | RESPIRATORY_TRACT | Status: DC
  Administered 2019-04-13 (×3): 3 mL via RESPIRATORY_TRACT
  Filled 2019-04-13 (×2): qty 3

## 2019-04-13 MED ORDER — LORAZEPAM 2 MG/ML IJ SOLN: 0.5000 mg | Freq: Four times a day (QID) | INTRAMUSCULAR | Status: DC | PRN

## 2019-04-13 MED ORDER — MOMETASONE FURO-FORMOTEROL FUM 200-5 MCG/ACT IN AERO
2.0000 | INHALATION_SPRAY | Freq: Two times a day (BID) | RESPIRATORY_TRACT | Status: DC
  Administered 2019-04-14 – 2019-04-15 (×2): 2 via RESPIRATORY_TRACT
  Filled 2019-04-13: qty 8.8

## 2019-04-13 MED ORDER — LIDOCAINE HCL (PF) 1 % IJ SOLN
INTRAMUSCULAR | Status: AC | PRN
  Administered 2019-04-13: 5 mL

## 2019-04-13 MED ORDER — MIDAZOLAM HCL 2 MG/2ML IJ SOLN
INTRAMUSCULAR | Status: AC
  Filled 2019-04-13: qty 6

## 2019-04-13 MED ORDER — DILTIAZEM HCL 25 MG/5ML IV SOLN
10.0000 mg | INTRAVENOUS | Status: DC | PRN
  Administered 2019-04-14: 10 mg via INTRAVENOUS
  Filled 2019-04-13 (×3): qty 5

## 2019-04-13 MED ORDER — PREDNISONE 20 MG PO TABS
40.0000 mg | ORAL_TABLET | Freq: Every day | ORAL | Status: DC
  Administered 2019-04-13 – 2019-04-14 (×2): 40 mg via ORAL
  Filled 2019-04-13 (×3): qty 2

## 2019-04-13 MED ORDER — MIDAZOLAM HCL 2 MG/2ML IJ SOLN
INTRAMUSCULAR | Status: AC | PRN
  Administered 2019-04-13 (×2): 1 mg via INTRAVENOUS

## 2019-04-13 MED ORDER — FENTANYL CITRATE (PF) 100 MCG/2ML IJ SOLN
INTRAMUSCULAR | Status: AC
  Filled 2019-04-13: qty 4

## 2019-04-13 MED ORDER — IPRATROPIUM-ALBUTEROL 0.5-2.5 (3) MG/3ML IN SOLN
3.0000 mL | Freq: Four times a day (QID) | RESPIRATORY_TRACT | Status: DC
  Administered 2019-04-13 – 2019-04-14 (×4): 3 mL via RESPIRATORY_TRACT
  Filled 2019-04-13 (×5): qty 3

## 2019-04-13 NOTE — Progress Notes (Signed)
IV access loss after IR biopsy procedure, attempted per 2 nurses on unit without success, IV team consult placed, paged ortho tech for unna boots order

## 2019-04-13 NOTE — Progress Notes (Signed)
PROGRESS NOTE                                                                                                                                                                                                             Patient Demographics:    Omar Wagner, is a 63 y.o. male, DOB - [birth year], XTG:[id number]  Admit date - 04/07/2019   Admitting Physician Toy Baker, MD  Outpatient Primary MD for the patient is [name], [name], NP  LOS - 1  Outpatient Specialists: Dr. Melvyn Novas (pulmonary)  Chief Complaint  Patient presents with   Shortness of Breath       Brief Narrative 63 year old male with severe COPD, active tobacco use, chronic lymphedema, and known left lung mass who had a PET scan done 1 month back showing enlarging posterior lung mass with invasion into the ribs and thoracic spine.  He was scheduled for CT biopsy of the mass on 10/29 but got canceled as patient felt like having a COPD exacerbation. He now presents to the ED with progressive shortness of breath and cough.  In the ED he was hypoxemic in the 80s requiring 4 L via nasal cannula.  In the ED he was tachypneic, afebrile, O2 sat improved to 92% on 4 L.  He had a CT of the chest with contrast done showing left lung mass with obstruction and complete opacification of the left hemithorax suspected secondary to postobstructive pneumonia with lung collapse.  Also showed metastatic spread to the liver. Placed on empiric antibiotics and admitted to stepdown unit.  COVID-19 was tested negative.    Subjective:   Patient still feeling short of breath.  Noted for episodes of heart rate going to 170s.  Overnight was restless pulling out his blood pressure cuff and leads.   Assessment  & Plan :   Principal problem Acute respiratory failure with hypoxemia (HCC) Secondary to postoperative pneumonia due to rapidly enlarging left lung mass along with mild COPD  exacerbation. Continue stepdown monitoring.  O2 sats in low 90s on 4 L via nasal cannula.  Continue empiric IV Zosyn.  Follow blood cultures.  Oral prednisone and scheduled DuoNeb.  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  Needs tissue diagnosis.  IR consulted who is not convinced about liver mets given  there were no lesions on recent PET scan.  Plans on attempting posterior lung biopsy.  If lung biopsy not successful next option would be obtaining MRI of the abdomen to evaluate for liver lesions and get liver biopsy or endobronchial ultrasound with biopsy. Radiation oncology consulted who will evaluate. Pulmonary consult on board.  Hypercalcemia of malignancy Given a dose of zoledronate on admission.  Continue IV hydration.  PTH and PTH related peptide level ordered. Since corrected calcium level is less than 14 does not need calcitonin.  Sinus tachycardia/ SVTs Will add as needed Cardizem.  Check magnesium  Chronic lymphedema with multiple lower leg open wounds Has erythema over the legs.  Wound care consulted.  Check Doppler lower extremity to rule out DVT.  COPD Gold 3 Has low FEV1 (33%) as per last PFT 2 years ago).  Active tobacco use.  Ordered nicotine patch.  Treatment as above.      Code Status : Full code  Family Communication  : None  Disposition Plan  : Pending hospital course  Barriers For Discharge : Active symptoms  Consults  : Pulmonary, IR, radiation oncology  Procedures  : CT chest  DVT Prophylaxis  :  Lovenox -  Lab Results  Component Value Date   PLT 146 (L) 04/13/2019    Antibiotics  :    Anti-infectives (From admission, onward)   Start     Dose/Rate Route Frequency Ordered Stop   03/31/2019 2200  piperacillin-tazobactam (ZOSYN) IVPB 3.375 g     3.375 g 12.5 mL/hr over 240 Minutes Intravenous Every 8 hours 03/31/2019 2033     04/15/2019 1900  vancomycin (VANCOCIN) 1,750 mg in sodium chloride 0.9  % 500 mL IVPB     1,750 mg 250 mL/hr over 120 Minutes Intravenous  Once 03/29/2019 1830 03/30/2019 2200   03/28/2019 1830  ceFEPIme (MAXIPIME) 1 g in sodium chloride 0.9 % 100 mL IVPB  Status:  Discontinued     1 g 200 mL/hr over 30 Minutes Intravenous  Once 04/04/2019 1828 04/16/2019 1829   03/29/2019 1830  ceFEPIme (MAXIPIME) 2 g in sodium chloride 0.9 % 100 mL IVPB     2 g 200 mL/hr over 30 Minutes Intravenous  Once 03/31/2019 1829 03/26/2019 1940   04/06/2019 1800  cefTRIAXone (ROCEPHIN) 1 g in sodium chloride 0.9 % 100 mL IVPB  Status:  Discontinued     1 g 200 mL/hr over 30 Minutes Intravenous  Once 04/07/2019 1748 04/08/2019 1842   04/13/2019 1800  azithromycin (ZITHROMAX) 500 mg in sodium chloride 0.9 % 250 mL IVPB  Status:  Discontinued     500 mg 250 mL/hr over 60 Minutes Intravenous  Once 03/23/2019 1748 03/26/2019 1940        Objective:   Vitals:   04/13/19 0817 04/13/19 0838 04/13/19 1001 04/13/19 1025  BP:    (!) 156/98  Pulse:   93 88  Resp:   (!) 27 (!) 22  Temp:      TempSrc:      SpO2: 90% 90% 90%   Weight:      Height:        Wt Readings from Last 3 Encounters:  04/13/19 80.6 kg  10/30/18 84.4 kg  10/27/18 83.9 kg     Intake/Output Summary (Last 24 hours) at 04/13/2019 1039 Last data filed at 04/13/2019 4481 Gross per 24 hour  Intake 1194.9 ml  Output 2275 ml  Net -1080.1 ml     Physical Exam General: Elderly male lying  in bed in some respiratory distress HEENT: no pallor, moist mucosa, supple neck Chest: Diminished left-sided breath sounds with scattered rhonchi CVS: S1 and S2 tachycardic, no murmurs GI: soft, NT, ND, BS+ Musculoskeletal: warm, erythema over bilateral lower leg (R >L) with chronic lymphedema and multiple lower extremity wounds     Data Review:    CBC Recent Labs  Lab 03/31/2019 1441 04/13/19 0129  WBC 8.1 7.0  HGB 14.7 15.7  HCT 45.6 47.8  PLT 153 146*  MCV 111.5* 111.9*  MCH 35.9* 36.8*  MCHC 32.2 32.8  RDW 12.3 12.5  LYMPHSABS 0.7  --    MONOABS 0.5  --   EOSABS 0.0  --   BASOSABS 0.0  --     Chemistries  Recent Labs  Lab 03/27/2019 1350 04/13/19 0129 04/13/19 0830  NA 143 140  --   K 3.7 4.4  --   CL 104 104  --   CO2 29 24  --   GLUCOSE 104* 167*  --   BUN 20 18  --   CREATININE 1.02 1.07  --   CALCIUM 14.1* 12.3* 12.0*  MG  --  1.7  --   AST 35 43*  --   ALT 16 13  --   ALKPHOS 81 68  --   BILITOT 1.1 1.5*  --    ------------------------------------------------------------------------------------------------------------------ No results for input(s): CHOL, HDL, LDLCALC, TRIG, CHOLHDL, LDLDIRECT in the last 72 hours.  No results found for: HGBA1C ------------------------------------------------------------------------------------------------------------------ Recent Labs    04/13/19 0129  TSH 0.179*   ------------------------------------------------------------------------------------------------------------------ No results for input(s): VITAMINB12, FOLATE, FERRITIN, TIBC, IRON, RETICCTPCT in the last 72 hours.  Coagulation profile No results for input(s): INR, PROTIME in the last 168 hours.  No results for input(s): DDIMER in the last 72 hours.  Cardiac Enzymes No results for input(s): CKMB, TROPONINI, MYOGLOBIN in the last 168 hours.  Invalid input(s): CK ------------------------------------------------------------------------------------------------------------------    Component Value Date/Time   BNP 84.3 04/05/2019 1458    Inpatient Medications  Scheduled Meds:  Chlorhexidine Gluconate Cloth  6 each Topical Daily   ipratropium-albuterol  3 mL Nebulization Q4H   mouth rinse  15 mL Mouth Rinse BID   metoprolol tartrate  25 mg Oral BID   mometasone-formoterol  2 puff Inhalation BID   montelukast  10 mg Oral Daily   predniSONE  40 mg Oral Q breakfast   sodium chloride flush  3 mL Intravenous Q12H   Continuous Infusions:  sodium chloride 125 mL/hr at 04/13/19 0859    piperacillin-tazobactam (ZOSYN)  IV Stopped (04/13/19 0958)   PRN Meds:.acetaminophen **OR** acetaminophen, albuterol, HYDROcodone-acetaminophen, ondansetron **OR** ondansetron (ZOFRAN) IV, sodium chloride flush  Micro Results Recent Results (from the past 240 hour(s))  Blood culture (routine x 2)     Status: None (Preliminary result)   Collection Time: 03/25/2019  2:17 PM   Specimen: BLOOD  Result Value Ref Range Status   Specimen Description   Final    BLOOD LEFT ANTECUBITAL Performed at [facility], [place] [address], [place], [place] [address]    Special Requests   Final    BOTTLES DRAWN AEROBIC AND ANAEROBIC Blood Culture adequate volume Performed at [place] [address] [place], [place] [address]    Culture PENDING  Incomplete   Report Status PENDING  Incomplete  SARS CORONAVIRUS 2 (TAT 6-24 HRS) Nasopharyngeal Nasopharyngeal Swab     Status: None   Collection Time: 04/11/2019  3:37 PM   Specimen:  Nasopharyngeal Swab  Result Value Ref Range Status   SARS Coronavirus 2 NEGATIVE NEGATIVE Final    Comment: (NOTE) SARS-CoV-2 target nucleic acids are NOT DETECTED. The SARS-CoV-2 RNA is generally detectable in upper and lower respiratory specimens during the acute phase of infection. Negative results do not preclude SARS-CoV-2 infection, do not rule out co-infections with other pathogens, and should not be used as the sole basis for treatment or other patient management decisions. Negative results must be combined with clinical observations, patient history, and epidemiological information. The expected result is Negative. Fact Sheet for Patients: SugarRoll.be Fact Sheet for Healthcare Providers: https://www.woods-mathews.com/ This test is not yet approved or cleared by the [place] FDA and  has been authorized for detection and/or diagnosis of SARS-CoV-2 by FDA under an Emergency Use Authorization  (EUA). This EUA will remain  in effect (meaning this test can be used) for the duration of the COVID-19 declaration under Section 56 4(b)(1) of the Act, 21 U.S.C. section 360bbb-3(b)(1), unless the authorization is terminated or revoked sooner. Performed at [facility], [place] [address] [place], [place] [address]   MRSA PCR Screening     Status: None   Collection Time: 04/05/2019  9:30 PM   Specimen: Nasal Mucosa; Nasopharyngeal  Result Value Ref Range Status   MRSA by PCR NEGATIVE NEGATIVE Final    Comment:        The GeneXpert MRSA Assay (FDA approved for NASAL specimens only), is one component of a comprehensive MRSA colonization surveillance program. It is not intended to diagnose MRSA infection nor to guide or monitor treatment for MRSA infections. Performed at [facility], [place] [address] [place], [place] [address]     Radiology Reports Ct Chest W Contrast  Result Date: 03/26/2019 CLINICAL DATA:  Shortness of breath and chest pain. History of basal cell carcinoma. EXAM: CT CHEST WITH CONTRAST TECHNIQUE: Multidetector CT imaging of the chest was performed during intravenous contrast administration. CONTRAST:  61mL OMNIPAQUE IOHEXOL 300 MG/ML  SOLN COMPARISON:  01/08/2019.  PET-CT dated 03/01/2019 FINDINGS: Cardiovascular: The heart size is stable from prior study. The main pulmonary artery is dilated measuring approximately 3.6 cm in diameter. There is no aortic aneurysm. No thoracic aortic dissection. Mediastinum/Nodes: --there are new pathologically enlarged mediastinal lymph nodes measuring up to approximately 2.3 cm in the short axis. There are pathologically enlarged left hilar lymph nodes. --No axillary lymphadenopathy. --No supraclavicular lymphadenopathy. --Normal thyroid gland. --The esophagus is unremarkable Lungs/Pleura: There is new near complete collapse of the left upper lobe. There is consolidation at the left lung apex which may represent  postobstructive pneumonia. The left upper lobe bronchus appears to be occluded. There is significant narrowing of the left lower lobe bronchus. There is some consolidation involving the left lower lobe with a small left-sided pleural effusion. The previously demonstrated left upper lobe lung mass appears to have substantially increased in size but is difficult to distinguish from the collapsed lung. An estimate is that the mass mass measures at least 9.2 x 7.6 cm. There are mild emphysematous changes in the right lung field. There is a growing spiculated 1 cm pulmonary nodule in the right upper lobe (axial series 5, image 54). There is a significant amount of debris within the trachea and right mainstem bronchus. There is a new pulmonary opacity in the anterior right upper lobe measuring approximately 1.7 cm (axial series 5, image 62). There is some atelectasis versus consolidation involving the right lower lobe. Upper Abdomen: There  are new hypoattenuating masses in the liver measuring 1.9 cm in hepatic segment 4A and 1.7 cm in hepatic segment 4 B. Musculoskeletal: There is new destruction of the posterior fifth and sixth ribs on the left consistent with osseous involvement of the known lung mass. IMPRESSION: 1. New near complete collapse of the left upper lobe felt to be secondary to progression of the patient's known left upper lobe lung mass, compressing the left upper lobe bronchus. 2. Findings consistent with significant interval progression of disease as evidenced by new pathologically enlarged mediastinal lymph nodes as well as multiple new low-attenuation masses in the liver concerning for hepatic metastatic disease. In addition, there are new pulmonary opacities in the right upper and right lower lobes raising concern for additional foci of metastatic disease. 3. Consolidation involving the left lung apex concerning for postobstructive pneumonia. 4. Moderate amount of debris within the trachea and bronchi  bilaterally. 5. Significant narrowing of the left lower lobe bronchus. There is consolidation involving the left lower lobe with a small left-sided pleural effusion. 6. New osseous involvement involving the posterior fifth and sixth ribs on the left. Electronically Signed   By: Constance Holster M.D.   On: 03/27/2019 17:44   Dg Chest Port 1 View  Result Date: 03/21/2019 CLINICAL DATA:  Patient reports to the ED with complaint of SOB and chest pain. Patient has a hx of lymphoma and reports he has been receiving care. [name] legs have an odor and his skin is dry and flaky EXAM: PORTABLE CHEST 1 VIEW COMPARISON:  10/27/2018.  PET-CT, 03/01/2019. FINDINGS: Near complete opacification of the left hemithorax. This has developed since prior chest radiograph, and since the more recent prior PET-CT. The opacification is consistent combination of the posterior left upper lobe hypermetabolic mass noted on the prior PET-CT in combination atelectasis or postobstructive consolidation. Pleural fluid is also suspected. Right lung is hyperexpanded, but otherwise clear. No right pleural effusion. No pneumothorax. IMPRESSION: 1. Significant interval worsening left lung aeration. Left hemithorax is now mostly opacified consistent with a combination of the presumed left upper lobe malignancy with postobstructive change/atelectasis and probable pleural fluid. Electronically Signed   By: Lajean Manes M.D.   On: 03/19/2019 14:25    Time Spent in minutes 35   Pearline Yerby M.D on 04/13/2019 at 10:39 AM  Between 7am to 7pm - Pager - [phone]  After 7pm go to [email or web address] - password [facility]  [facility] -  Office  [phone]

## 2019-04-13 NOTE — Progress Notes (Signed)
OT Cancellation Note  Patient Details Name: WINFRED IIAMS MRN: [id number] DOB: 12-17-1955   Cancelled Treatment:    Reason Eval/Treat Not Completed: Other (comment) OT order received and chart reviewed. However, RN deferred OT evaluation this date stating pt not appropriate at this time-RN note cites pt up fidgeting with lines over night and episodes of noncompliance. Will f/u as pt becomes more appropriate for OT evaluation. Thank you.  Jake Church Ruta Capece  Pager [phone] 04/13/2019, 11:13 AM

## 2019-04-13 NOTE — Consult Note (Addendum)
NAME:  Omar Wagner, MRN:  [id number], DOB:  Apr 23, 1956, LOS: 1 ADMISSION DATE:  03/24/2019, CONSULTATION DATE:  04/13/19 REFERRING MD: Louellen Molder, MD CHIEF COMPLAINT:  Lung mass with mets  Brief History   63 year old male with very severe COPD (FEV1 33%) who presents with acute hypoxemic respiratory failure secondary to post-obstructive pneumonia in setting of LUL compression/collapse from left lung mass  History of present illness   Omar Wagner is a 63 year old male active smoker with very severe COPD (FEV1 33%) and known lung mass who presents for COPD exacerbation secondary to postobstructive pneumonia.  He reports that for the last 2 weeks he has not had any maintenance inhalers due to cost.  He was previously on Trelegy after running out, he developed worsening shortness of breath, productive cough with yellow sputum wheezing.  He is unable to walk to the bathroom without needing to stop and rest.  He reports needing his albuterol inhaler more than 10 times a day for symptoms.  Also endorses fevers and chills in the last 2 days.  His dyspnea has worsened in the last week and has shortness of breath at rest.  Conservative management review of imaging he has had worsening, since 01/08/2019 patient has had known posterior lung or upper lobe mass concerning for bronchogenic neoplasm. PET scan on 03/01/19 demonstrated enlarging and intensely hypermetabolic posterior lung mass measuring 8.5 x4.8 cm with direct invasion between the lateral left 5th and 6th ribs and direct paraspinal invasion at the T4 level (previously measured 3.9 x 5.9 cm mass abutting the left posterior chest without invasion). His pulmonologist Dr. Melvyn Novas had planned for IR biopsy for tissue diagnosis however this has not yet been done.  In the ED, he was hypoxemic to 87% and required 3-4L O2 with improvement to 92%. He was started on antibiotics, scheduled bronchodilators and steroids and admitted to [facility] for COPD  exacerbation. PCCM was consulted regarding lung mass.  Past Medical History  Very severe COPD (FEV1 33%) in 2018 Depression Hx basal cell carcinoma  Significant Hospital Events   11/26 Admitted  Consults:  [facility] IR PCCM  Procedures:    Significant Diagnostic Tests:  PET 03/01/19 1. Intensely hypermetabolic (max SUV 40.9) large poorly marginated 8.5 x 4.8 cm posterior left upper lobe lung mass with direct peripheral left chest wall invasion between the lateral left fifth and sixth ribs and direct left paraspinal invasion at the T4 level, significantly increased in size since 01/08/2019 chest CT, compatible with aggressive locally advanced primary bronchogenic carcinoma. 2. Hypermetabolic ipsilateral mediastinal lymph node metastases in the AP window and left paraesophageal chains. 3. Hypermetabolic distant nodal metastasis in the left external iliac chain. 4. Hypermetabolic posterior left chest wall metastasis between left sixth and seventh ribs. 5. Hypermetabolic osseous metastases to the left sixth rib. 6. PET-CT stage IVB (T4 N2 M1c). 7. New 5 mm right upper lobe pulmonary nodule and slight growth of 4 mm right middle lobe pulmonary nodule, below PET resolution, cannot exclude pulmonary metastases. 8. Aortic Atherosclerosis (ICD10-I70.0) and Emphysema (ICD10-J43.9).  CT Chest W Contrast 04/11/2019 1. New near complete collapse of the left upper lobe felt to be secondary to progression of the patient's known left upper lobe lung mass, compressing the left upper lobe bronchus. 2. Findings consistent with significant interval progression of disease as evidenced by new pathologically enlarged mediastinal lymph nodes as well as multiple new low-attenuation masses in the liver concerning for hepatic metastatic disease. In addition, there are new  pulmonary opacities in the right upper and right lower lobes raising concern for additional foci of metastatic disease. 3.  Consolidation involving the left lung apex concerning for postobstructive pneumonia. 4. Moderate amount of debris within the trachea and bronchi bilaterally. 5. Significant narrowing of the left lower lobe bronchus. There is consolidation involving the left lower lobe with a small left-sided pleural effusion. 6. New osseous involvement involving the posterior fifth and sixth ribs on the left.  Micro Data:  BCx 03/31/2019 Sputum Cx 04/13/19  Antimicrobials:  Vanc 11/26> Cefepime 11/26> Ceftriaxone 11/26> Azithro 11/26>  Interim history/subjective:    Objective   Blood pressure (!) 156/98, pulse (!) 53, temperature 98.4 F (36.9 C), temperature source Oral, resp. rate (!) 24, height 6\' 1"  (1.854 m), weight 80.6 kg, SpO2 90 %.        Intake/Output Summary (Last 24 hours) at 04/13/2019 0826 Last data filed at 04/13/2019 0809 Gross per 24 hour  Intake 1142 ml  Output 2175 ml  Net -1033 ml   Filed Weights   04/02/2019 2130 04/13/19 0540  Weight: 81.9 kg 80.6 kg   Physical Exam: General: Well-appearing, no acute distress HENT: [place], AT, OP clear, MMM Eyes: EOMI, no scleral icterus Respiratory: Diminished breath sounds bilaterally. Absent LUL breath sounds. No crackles, wheezing or rales Cardiovascular: RRR, -M/R/G, no JVD GI: BS+, soft, nontender Extremities: Lower extremity, lymphedema, flaky, sloughing skin Neuro: AAO x4, CNII-XII grossly intact  Psych: Normal mood, normal affect  Resolved Hospital Problem list   N/A  Assessment & Plan:  63 year old male with very severe COPD (FEV1 33%) admitted for COPD exacerbation secondary to postobstructive pneumonia.  Recent PET and CT imaging reviewed personally and interpreted by me which demonstrates progressive left upper lobe lung mass, now with chest wall invasion and left upper lobe collapse and suspected extrinsic compression of the airways.  Also concern for interval development of hepatic metastatic lesions.  I discussed  case with IR and hospitalist attending.  Bronchoscopy would be feasible however patient would be at risk for post-procedure complications including respiratory failure with prolonged mechanical ventilation in the setting of his very poor baseline lung function.  After IR reviewed CT chest, unclear if hepatic lesions actually represent metastatic disease with preference to biopsy more high yield lesions.  After discussion, IR agreeable to pursue biopsy of chest wall lesions versus posterior lung mass for tissue diagnosis.  Left upper lobe lung mass with collapse extrinsic compression of airways Patient currently NPO Scheduled with IR for tissue biopsy Recommend consulting Rad Onc Chest PT/Flutter valve  COPD exacerbation Acute hypoxemic respiratory failure Postobstructive pneumonia Recommend de-escalating antibiotics for community-acquired pneumonia coverage x 7 days Follow-up sputum culture Prednisone x5 days Increase Dulera to 200-5 mcg 2 puff BID Duonebs q4h   Pulmonary will continue to follow  Best practice:  Diet: NPO Pain/Anxiety/Delirium protocol (if indicated): N/A VAP protocol (if indicated): N/A DVT prophylaxis: Hold anticoagulation in setting of procedure GI prophylaxis: Per primary Glucose control: Per primary Mobility: Mobility as tolerate Code Status: Full Family Communication: Updated patient on 11/27 Disposition: Per Primary Team  Labs   CBC: Recent Labs  Lab 03/29/2019 1441 04/13/19 0129  WBC 8.1 7.0  NEUTROABS 6.8  --   HGB 14.7 15.7  HCT 45.6 47.8  MCV 111.5* 111.9*  PLT 153 146*    Basic Metabolic Panel: Recent Labs  Lab 03/25/2019 1350 04/13/19 0129  NA 143 140  K 3.7 4.4  CL 104 104  CO2 29 24  GLUCOSE  104* 167*  BUN 20 18  CREATININE 1.02 1.07  CALCIUM 14.1* 12.3*  MG  --  1.7  PHOS  --  2.7   GFR: Estimated Creatinine Clearance: 77.8 mL/min (by C-G formula based on SCr of 1.07 mg/dL). Recent Labs  Lab 04/04/2019 1350 04/06/2019 1441  04/13/19 0129  WBC  --  8.1 7.0  LATICACIDVEN 2.2*  --  2.9*    Liver Function Tests: Recent Labs  Lab 04/15/2019 1350 04/13/19 0129  AST 35 43*  ALT 16 13  ALKPHOS 81 68  BILITOT 1.1 1.5*  PROT 7.5 6.9  ALBUMIN 3.6 3.2*   No results for input(s): LIPASE, AMYLASE in the last 168 hours. No results for input(s): AMMONIA in the last 168 hours.  ABG    Component Value Date/Time   HCO3 31.9 (H) 04/03/2019 1517   TCO2 26.1 01/11/2013 0610   O2SAT 69.2 03/22/2019 1517     Coagulation Profile: No results for input(s): INR, PROTIME in the last 168 hours.  Cardiac Enzymes: No results for input(s): CKTOTAL, CKMB, CKMBINDEX, TROPONINI in the last 168 hours.  HbA1C: No results found for: HGBA1C  CBG: No results for input(s): GLUCAP in the last 168 hours.  Review of Systems:   Review of Systems  Constitutional: Positive for chills, fever and malaise/fatigue. Negative for diaphoresis and weight loss.  HENT: Positive for congestion. Negative for ear pain and sore throat.   Respiratory: Positive for cough, sputum production and shortness of breath. Negative for hemoptysis and wheezing.   Cardiovascular: Negative for chest pain, palpitations and leg swelling.  Gastrointestinal: Negative for abdominal pain, heartburn and nausea.  Genitourinary: Negative for frequency.  Musculoskeletal: Negative for joint pain and myalgias.  Skin: Negative for itching and rash.  Neurological: Negative for dizziness, weakness and headaches.  Endo/Heme/Allergies: Does not bruise/bleed easily.  Psychiatric/Behavioral: Negative for depression. The patient is not nervous/anxious.      Past Medical History  He,  has a past medical history of Basal cell carcinoma, COPD (chronic obstructive pulmonary disease) ([place]), and Depression.   Surgical History    Past Surgical History:  Procedure Laterality Date   HERNIA REPAIR       Social History   reports that he has been smoking cigarettes. He has a  30.00 pack-year smoking history. He has never used smokeless tobacco. He reports current alcohol use. He reports current drug use. Drugs: Heroin and IV.   Family History   His family history includes Breast cancer in his mother and sister; Cancer in an other family member; Colon cancer in his father; Liver cancer in his brother.   Allergies No Known Allergies   Home Medications  Prior to Admission medications   Medication Sig Start Date End Date Taking? Authorizing Provider  albuterol (ACCUNEB) 0.63 MG/3ML nebulizer solution Take 3 mLs (0.63 mg total) by nebulization every 6 (six) hours as needed for wheezing or shortness of breath. 03/14/19  Yes Tanda Rockers, MD  albuterol (VENTOLIN HFA) 108 (90 Base) MCG/ACT inhaler Inhale 2 puffs into the lungs every 4 (four) hours as needed for wheezing or shortness of breath. 03/14/19  Yes Tanda Rockers, MD  metoprolol tartrate (LOPRESSOR) 25 MG tablet Take 1 tablet (25 mg total) by mouth 2 (two) times daily. 12/07/17  Yes Martyn Ehrich, NP  montelukast (SINGULAIR) 10 MG tablet Take 10 mg by mouth daily. 03/04/19  Yes [provider]  Multiple Vitamin (MULITIVITAMIN WITH MINERALS) TABS Take 1 tablet by mouth daily.  [provider]  predniSONE (DELTASONE) 10 MG tablet Take  4 each am x 2 days,   2 each am x 2 days,  1 each am x 2 days and stop Patient not taking: Reported on 04/07/2019 03/29/19   Tanda Rockers, MD    Rodman Pickle, M.D. [facility] Pulmonary/Critical Care Medicine 04/13/2019 8:26 AM

## 2019-04-13 NOTE — Procedures (Signed)
Pre procedural Dx: Concern for metastatic lung cancer, now with hypermetabolic soft tissue nodule within the posterior left chest wall. Post procedural Dx: Same  Technically successful CT guided biopsy of soft tissue nodule within the left posterior chest wall.   EBL: None.  Complications: None immediate.   Ronny Bacon, MD Pager #: [phone]

## 2019-04-13 NOTE — Progress Notes (Signed)
Patient has been noncompliant with most treatment throughout the night. He has continued to constantly remove his BP cuff, EKG leads, and spo2 monitor. I have medicated him several times throughout the night for pain. Patient is alert and oriented but does not seem to comprehend the severity of his illness. Wound consult placed- lower extremities need to be assessed asap. I have not placed SCDs on the lower extremities because the skin is sloughing off in large flakes. The patient had what was described by the ED to be a pimple on the back of his right leg. When patient arrived his ace wrap bandages were hanging from his ankles and the right bandage was saturated with blood. The entire back of that leg is a wound, we cleansed his legs, removed the ace wraps and elevated his legs on a pillow. We placed abd pads under the legs to provide some protection from friction of him moving and we placed heel protectors on his feet. Patient could benefit from medication to help him relax. Patient has began to fidget more over the last hour and pain medication has not helped him to relax.

## 2019-04-13 NOTE — Consult Note (Signed)
Radiation Oncology         [phone] ________________________________  Name: Omar Wagner MRN: [id number]  Date: 03/28/2019  DOB: Sep 16, 1955  UV:OZDGUY, [name], NP      REFERRING PHYSICIAN: Nishant Dhungel   DIAGNOSIS: Presumed stage IV lung cancer  HISTORY OF PRESENT ILLNESS::Omar Wagner is a 63 y.o. male who is seen for an initial consultation visit regarding the patient's diagnosis of a lung mass which appears consistent with stage IV lung cancer.  The patient has a known history of a left lung mass which has been in the process of being worked up.  The patient CT images have demonstrated a suspicious left lung mass consistent with a lung primary.  PET scan subsequently revealed hypermetabolic activity consistent with a lung primary.  Recent CT imaging as an inpatient demonstrates further progression of the tumor with consolidation of the left lung.  The patient has been admitted for shortness of breath and for further work-up of this lung mass.  His images are consistent with stage IV disease.  The patient underwent a CT-guided biopsy today with no previous biopsy available.  Given these findings and the impact of the central left lung tumor, I have been asked to consider the patient for palliative radiation treatment to this area.    PREVIOUS RADIATION THERAPY: No   PAST MEDICAL HISTORY:  has a past medical history of Basal cell carcinoma, COPD (chronic obstructive pulmonary disease) ([place]), and Depression.     PAST SURGICAL HISTORY: Past Surgical History:  Procedure Laterality Date   HERNIA REPAIR       FAMILY HISTORY: family history includes Breast cancer in his mother and sister; Cancer in an other family member; Colon cancer in his father; Liver cancer in his brother.   SOCIAL HISTORY:  reports that he has been smoking cigarettes. He has a 30.00 pack-year smoking history. He has never used smokeless tobacco. He reports current alcohol use. He reports current drug  use. Drugs: Heroin and IV.   ALLERGIES: Patient has no known allergies.   MEDICATIONS:  Current Facility-Administered Medications  Medication Dose Route Frequency Provider Last Rate Last Dose   0.9 %  sodium chloride infusion   Intravenous Continuous Dhungel, Nishant, MD 125 mL/hr at 04/13/19 1553     acetaminophen (TYLENOL) tablet 650 mg  650 mg Oral Q6H PRN Toy Baker, MD       Or   acetaminophen (TYLENOL) suppository 650 mg  650 mg Rectal Q6H PRN Doutova, Anastassia, MD       albuterol (PROVENTIL) (2.5 MG/3ML) 0.083% nebulizer solution 3 mL  3 mL Inhalation Q4H PRN Doutova, Anastassia, MD       Chlorhexidine Gluconate Cloth 2 % PADS 6 each  6 each Topical Daily Doutova, Anastassia, MD   6 each at 04/13/19 1058   diltiazem (CARDIZEM) injection 10 mg  10 mg Intravenous Q4H PRN Dhungel, Nishant, MD       fentaNYL (SUBLIMAZE) 100 MCG/2ML injection            HYDROcodone-acetaminophen (NORCO/VICODIN) 5-325 MG per tablet 1-2 tablet  1-2 tablet Oral Q4H PRN Doutova, Anastassia, MD   2 tablet at 04/13/19 0900   ipratropium-albuterol (DUONEB) 0.5-2.5 (3) MG/3ML nebulizer solution 3 mL  3 mL Nebulization Q4H Dhungel, Nishant, MD   3 mL at 04/13/19 1607   LORazepam (ATIVAN) injection 0.5 mg  0.5 mg Intravenous Q6H PRN Dhungel, Nishant, MD       MEDLINE mouth rinse  15 mL Mouth  Rinse BID Toy Baker, MD   15 mL at 04/13/19 0900   metoprolol tartrate (LOPRESSOR) tablet 25 mg  25 mg Oral BID Toy Baker, MD   25 mg at 04/13/19 0900   midazolam (VERSED) 2 MG/2ML injection            mometasone-formoterol (DULERA) 200-5 MCG/ACT inhaler 2 puff  2 puff Inhalation BID Margaretha Seeds, MD       montelukast (SINGULAIR) tablet 10 mg  10 mg Oral Daily Doutova, Nyoka Lint, MD   10 mg at 04/13/19 0900   ondansetron (ZOFRAN) tablet 4 mg  4 mg Oral Q6H PRN Toy Baker, MD       Or   ondansetron (ZOFRAN) injection 4 mg  4 mg Intravenous Q6H PRN Doutova,  Anastassia, MD       piperacillin-tazobactam (ZOSYN) IVPB 3.375 g  3.375 g Intravenous Q8H Doutova, Anastassia, MD 12.5 mL/hr at 04/13/19 1557 3.375 g at 04/13/19 1557   predniSONE (DELTASONE) tablet 40 mg  40 mg Oral Q breakfast Margaretha Seeds, MD   40 mg at 04/13/19 0900   sodium chloride flush (NS) 0.9 % injection 3 mL  3 mL Intravenous Q12H Doutova, Anastassia, MD   3 mL at 04/13/19 0901   sodium chloride flush (NS) 0.9 % injection 3 mL  3 mL Intravenous PRN Toy Baker, MD         REVIEW OF SYSTEMS:  A 15 point review of systems is documented in the electronic medical record. This was obtained by the nursing staff. However, I reviewed this with the patient to discuss relevant findings and make appropriate changes.  Pertinent items are noted in HPI.    PHYSICAL EXAM:  height is 6\' 1"  (1.854 m) and weight is 177 lb 11.1 oz (80.6 kg). His oral temperature is 98.3 F (36.8 C). His blood pressure is 147/90 (abnormal) and his pulse is 77. His respiration is 32 (abnormal) and oxygen saturation is 93%.   ECOG = 2  0 - Asymptomatic (Fully active, able to carry on all predisease activities without restriction)  1 - Symptomatic but completely ambulatory (Restricted in physically strenuous activity but ambulatory and able to carry out work of a light or sedentary nature. For example, light housework, office work)  2 - Symptomatic, <50% in bed during the day (Ambulatory and capable of all self care but unable to carry out any work activities. Up and about more than 50% of waking hours)  3 - Symptomatic, >50% in bed, but not bedbound (Capable of only limited self-care, confined to bed or chair 50% or more of waking hours)  4 - Bedbound (Completely disabled. Cannot carry on any self-care. Totally confined to bed or chair)  5 - Death   Eustace Pen MM, Creech RH, Tormey DC, et al. [phone]). "Toxicity and response criteria of the [facility]". [place] Oncol. 5 (6):  649-55   LABORATORY DATA:  Lab Results  Component Value Date   WBC 7.0 04/13/2019   HGB 15.7 04/13/2019   HCT 47.8 04/13/2019   MCV 111.9 (H) 04/13/2019   PLT 146 (L) 04/13/2019   Lab Results  Component Value Date   NA 140 04/13/2019   K 4.4 04/13/2019   CL 104 04/13/2019   CO2 24 04/13/2019   Lab Results  Component Value Date   ALT 13 04/13/2019   AST 43 (H) 04/13/2019   ALKPHOS 68 04/13/2019   BILITOT 1.5 (H) 04/13/2019  RADIOGRAPHY: Ct Chest W Contrast  Result Date: 04/04/2019 CLINICAL DATA:  Shortness of breath and chest pain. History of basal cell carcinoma. EXAM: CT CHEST WITH CONTRAST TECHNIQUE: Multidetector CT imaging of the chest was performed during intravenous contrast administration. CONTRAST:  65mL OMNIPAQUE IOHEXOL 300 MG/ML  SOLN COMPARISON:  01/08/2019.  PET-CT dated 03/01/2019 FINDINGS: Cardiovascular: The heart size is stable from prior study. The main pulmonary artery is dilated measuring approximately 3.6 cm in diameter. There is no aortic aneurysm. No thoracic aortic dissection. Mediastinum/Nodes: --there are new pathologically enlarged mediastinal lymph nodes measuring up to approximately 2.3 cm in the short axis. There are pathologically enlarged left hilar lymph nodes. --No axillary lymphadenopathy. --No supraclavicular lymphadenopathy. --Normal thyroid gland. --The esophagus is unremarkable Lungs/Pleura: There is new near complete collapse of the left upper lobe. There is consolidation at the left lung apex which may represent postobstructive pneumonia. The left upper lobe bronchus appears to be occluded. There is significant narrowing of the left lower lobe bronchus. There is some consolidation involving the left lower lobe with a small left-sided pleural effusion. The previously demonstrated left upper lobe lung mass appears to have substantially increased in size but is difficult to distinguish from the collapsed lung. An estimate is that the mass mass  measures at least 9.2 x 7.6 cm. There are mild emphysematous changes in the right lung field. There is a growing spiculated 1 cm pulmonary nodule in the right upper lobe (axial series 5, image 54). There is a significant amount of debris within the trachea and right mainstem bronchus. There is a new pulmonary opacity in the anterior right upper lobe measuring approximately 1.7 cm (axial series 5, image 62). There is some atelectasis versus consolidation involving the right lower lobe. Upper Abdomen: There are new hypoattenuating masses in the liver measuring 1.9 cm in hepatic segment 4A and 1.7 cm in hepatic segment 4 B. Musculoskeletal: There is new destruction of the posterior fifth and sixth ribs on the left consistent with osseous involvement of the known lung mass. IMPRESSION: 1. New near complete collapse of the left upper lobe felt to be secondary to progression of the patient's known left upper lobe lung mass, compressing the left upper lobe bronchus. 2. Findings consistent with significant interval progression of disease as evidenced by new pathologically enlarged mediastinal lymph nodes as well as multiple new low-attenuation masses in the liver concerning for hepatic metastatic disease. In addition, there are new pulmonary opacities in the right upper and right lower lobes raising concern for additional foci of metastatic disease. 3. Consolidation involving the left lung apex concerning for postobstructive pneumonia. 4. Moderate amount of debris within the trachea and bronchi bilaterally. 5. Significant narrowing of the left lower lobe bronchus. There is consolidation involving the left lower lobe with a small left-sided pleural effusion. 6. New osseous involvement involving the posterior fifth and sixth ribs on the left. Electronically Signed   By: Constance Holster M.D.   On: 04/09/2019 17:44   Ct Biopsy  Result Date: 04/13/2019 INDICATION: Concern for metastatic lung cancer. Please perform  CT-guided biopsy of enlarging hypermetabolic soft tissue between the posterior aspect of left sixth and seventh ribs for tissue diagnostic purposes. EXAM: CT-GUIDED BIOPSY OF HYPERMETABOLIC SOFT TISSUE BETWEEN THE POSTERIOR ASPECTS OF THE LEFT SIXTH AND SEVENTH RIBS. COMPARISON:  Chest CT-04/08/2019; 01/08/2019; PET-CT-03/01/2019 MEDICATIONS: None. ANESTHESIA/SEDATION: Fentanyl 100 mcg IV; Versed 2 mg IV Sedation time: 14 minutes; The patient was continuously monitored during the procedure by the interventional radiology  nurse under my direct supervision. CONTRAST:  None. COMPLICATIONS: None immediate. PROCEDURE: Informed consent was obtained from the patient following an explanation of the procedure, risks, benefits and alternatives. A time out was performed prior to the initiation of the procedure. The patient was positioned prone on the CT table and a limited CT was performed for procedural planning demonstrating unchanged size and appearance of the ill-defined infiltrative soft tissue involving the left posterior chest wall between the left sixth and seventh ribs with dominant soft tissue nodule measuring approximately 2.3 x 1.6 cm (image 17, series 2), previously found to be hypermetabolic on preceding PET-CT. The procedure was planned. The operative site was prepped and draped in the usual sterile fashion. Appropriate trajectory was confirmed with a 22 gauge spinal needle after the adjacent tissues were anesthetized with 1% Lidocaine with epinephrine. Under intermittent CT guidance, a 17 gauge coaxial needle was advanced into the peripheral aspect of the mass. Appropriate positioning was confirmed and 5 core needle biopsy samples were obtained with an 18 gauge core needle biopsy device. The co-axial needle was removed and hemostasis was achieved with manual compression. A limited postprocedural CT was negative for pneumothorax, hemorrhage or additional complication. A dressing was placed. The patient tolerated  the procedure well without immediate postprocedural complication. IMPRESSION: Technically successful CT guided core needle biopsy of infiltrative hypermetabolic soft tissue nodule between the posterior aspects of the left sixth and seventh ribs. Electronically Signed   By: Sandi Mariscal M.D.   On: 04/13/2019 13:53   Dg Chest Port 1 View  Result Date: 03/25/2019 CLINICAL DATA:  Patient reports to the ED with complaint of SOB and chest pain. Patient has a hx of lymphoma and reports he has been receiving care. [name] legs have an odor and his skin is dry and flaky EXAM: PORTABLE CHEST 1 VIEW COMPARISON:  10/27/2018.  PET-CT, 03/01/2019. FINDINGS: Near complete opacification of the left hemithorax. This has developed since prior chest radiograph, and since the more recent prior PET-CT. The opacification is consistent combination of the posterior left upper lobe hypermetabolic mass noted on the prior PET-CT in combination atelectasis or postobstructive consolidation. Pleural fluid is also suspected. Right lung is hyperexpanded, but otherwise clear. No right pleural effusion. No pneumothorax. IMPRESSION: 1. Significant interval worsening left lung aeration. Left hemithorax is now mostly opacified consistent with a combination of the presumed left upper lobe malignancy with postobstructive change/atelectasis and probable pleural fluid. Electronically Signed   By: Lajean Manes M.D.   On: 03/29/2019 14:25       IMPRESSION:  The patient has a left lung tumor which is consistent with a primary lung tumor, with the patient's imaging consistent with stage IV disease.  The patient's tumor extends to the left hilum.  He is an appropriate candidate for palliative radiation treatment for 2 to 3 weeks after pathologic confirmation of his tumor.  The goal of this treatment will be to shrink the central left lung tumor and hopefully help to open up the patient's left lung.  Extension of the tumor to the adjacent bone is  also apparent and this treatment will also hopefully help further development of worsening pain or extension into the spinal canal.   PLAN: The CT-guided biopsy is pending and hopefully we will have a result on Monday.  I am going to ask that the patient tentatively be scheduled for simulation/treatment planning in our department for Monday, 05/16/2019.  If we have pathology back in time, we may be able to  start his radiation treatment later this same day.  At this time, I would anticipate 2 weeks of radiation treatment although this can be extended if necessary depending on any plans for systemic treatment as well.  Medical oncology consultation will also be important although I do not believe at this time this is an urgent issue given the lack of pathologic results currently.    ________________________________   Jodelle Gross, MD, PhD   **Disclaimer: This note was dictated with voice recognition software. Similar sounding words can inadvertently be transcribed and this note may contain transcription errors which may not have been corrected upon publication of note.**

## 2019-04-13 NOTE — Progress Notes (Signed)
PT Cancellation Note  Patient Details Name: Omar Wagner MRN: [id number] DOB: April 18, 1956   Cancelled Treatment:     PT order received but eval deferred at request of RN - pt not appropriate for therapy this date.  Will follow.Debe Coder PT Acute Rehabilitation Services Pager [phone] Office [phone]    Briah Nary 04/13/2019, 10:02 AM

## 2019-04-13 NOTE — Consult Note (Signed)
Chief Complaint: Patient was seen in consultation today for CT-guided biopsy of left lung mass and/ or soft tissue lesion between the left sixth and seventh ribs Chief Complaint  Patient presents with  . Shortness of Breath   Referring Physician(s): Ellison,C/Doutova,A  Supervising Physician: Sandi Mariscal  Patient Status: [facility] - In-pt  History of Present Illness: Omar Wagner is a 63 y.o. male smoker with history of alcohol and IV drug abuse as well and severe COPD/known left lung mass who presented to [facility] 11/26 with COPD exacerbation secondary to postobstructive pneumonia.  He has had worsening shortness of breath along with intermittent fevers and chills.  He was COVID-19 negative.  Also with prior history of basal cell skin cancer, lower extremity edema/chronic venous stasis, prior PSVT and depression.  Work-up for left lung mass was done back in October but biopsy canceled because of COPD exacerbation.  PET scan at that time revealed: 1. Intensely hypermetabolic (max SUV 01.7) large poorly marginated 8.5 x 4.8 cm posterior left upper lobe lung mass with direct peripheral left chest wall invasion between the lateral left fifth and sixth ribs and direct left paraspinal invasion at the T4 level, significantly increased in size since 01/08/2019 chest CT, compatible with aggressive locally advanced primary bronchogenic carcinoma. 2. Hypermetabolic ipsilateral mediastinal lymph node metastases in the AP window and left paraesophageal chains. 3. Hypermetabolic distant nodal metastasis in the left external iliac chain. 4. Hypermetabolic posterior left chest wall metastasis between left sixth and seventh ribs. 5. Hypermetabolic osseous metastases to the left sixth rib. 6. PET-CT stage IVB (T4 N2 M1c). 7. New 5 mm right upper lobe pulmonary nodule and slight growth of 4 mm right middle lobe pulmonary nodule, below PET resolution, cannot exclude pulmonary metastases. 8. Aortic  Atherosclerosis (ICD10-I70.0) and Emphysema (ICD10-J43.9).  CT chest done yesterday revealed: 1. New near complete collapse of the left upper lobe felt to be secondary to progression of the patient's known left upper lobe lung mass, compressing the left upper lobe bronchus. 2. Findings consistent with significant interval progression of disease as evidenced by new pathologically enlarged mediastinal lymph nodes as well as multiple new low-attenuation masses in the liver concerning for hepatic metastatic disease. In addition, there are new pulmonary opacities in the right upper and right lower lobes raising concern for additional foci of metastatic disease. 3. Consolidation involving the left lung apex concerning for postobstructive pneumonia. 4. Moderate amount of debris within the trachea and bronchi bilaterally. 5. Significant narrowing of the left lower lobe bronchus. There is consolidation involving the left lower lobe with a small left-sided pleural effusion. 6. New osseous involvement involving the posterior fifth and sixth ribs on the left.  He is currently afebrile, WBC normal, platelets 146K, creatinine normal, lactic acid 2.9, PT/INR pending.  Request now received for CT-guided biopsy of left lung mass versus soft tissue lesion between the left sixth and seventh ribs.  Past Medical History:  Diagnosis Date  . Basal cell carcinoma   . COPD (chronic obstructive pulmonary disease) ([place])   . Depression     Past Surgical History:  Procedure Laterality Date  . HERNIA REPAIR      Allergies: Patient has no known allergies.  Medications: Prior to Admission medications   Medication Sig Start Date End Date Taking? Authorizing Provider  albuterol (ACCUNEB) 0.63 MG/3ML nebulizer solution Take 3 mLs (0.63 mg total) by nebulization every 6 (six) hours as needed for wheezing or shortness of breath. 03/14/19  Yes Tanda Rockers,  MD  albuterol (VENTOLIN HFA) 108 (90 Base) MCG/ACT  inhaler Inhale 2 puffs into the lungs every 4 (four) hours as needed for wheezing or shortness of breath. 03/14/19  Yes Tanda Rockers, MD  metoprolol tartrate (LOPRESSOR) 25 MG tablet Take 1 tablet (25 mg total) by mouth 2 (two) times daily. 12/07/17  Yes Martyn Ehrich, NP  montelukast (SINGULAIR) 10 MG tablet Take 10 mg by mouth daily. 03/04/19  Yes [provider]  Multiple Vitamin (MULITIVITAMIN WITH MINERALS) TABS Take 1 tablet by mouth daily.    [provider]  predniSONE (DELTASONE) 10 MG tablet Take  4 each am x 2 days,   2 each am x 2 days,  1 each am x 2 days and stop Patient not taking: Reported on 04/13/2019 03/29/19   Tanda Rockers, MD     Family History  Problem Relation Age of Onset  . Cancer Other   . Breast cancer Mother   . Colon cancer Father   . Breast cancer Sister   . Liver cancer Brother     Social History   Socioeconomic History  . Marital status: Widowed    Spouse name: Not on file  . Number of children: Not on file  . Years of education: Not on file  . Highest education level: Not on file  Occupational History  . Not on file  Social Needs  . Financial resource strain: Not on file  . Food insecurity    Worry: Not on file    Inability: Not on file  . Transportation needs    Medical: Not on file    Non-medical: Not on file  Tobacco Use  . Smoking status: Current Some Day Smoker    Packs/day: 1.00    Years: 30.00    Pack years: 30.00    Types: Cigarettes  . Smokeless tobacco: Never Used  Substance and Sexual Activity  . Alcohol use: Yes    Comment: social  . 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 Sexual activity: Never    Comment: opiate abuse  Lifestyle  . Physical activity    Days per week: Not on file    Minutes per session: Not on file  . Stress: Not on file  Relationships  . Social Herbalist on phone: Not on file    Gets together: Not on file    Attends religious  service: Not on file    Active member of club or organization: Not on file    Attends meetings of clubs or organizations: Not on file    Relationship status: Not on file  Other Topics Concern  . Not on file  Social History Narrative  . Not on file      Review of Systems he currently denies fever, headache, chest pain, abdominal/back pain, nausea, vomiting or bleeding.  He does continue to have dyspnea and cough.  Vital Signs: BP (!) 156/98   Pulse (!) 53   Temp 98.4 F (36.9 C) (Oral)   Resp (!) 24   Ht 6\' 1"  (1.854 m)   Wt 177 lb 11.1 oz (80.6 kg)   SpO2 90%   BMI 23.44 kg/m   Physical Exam eyes closed but patient responding appropriately to questions.  Chest with diminished breath sounds throughout left lung and right base.  Heart with slightly tachycardic rate, some occasional ectopy.  Abdomen soft, positive bowel sounds, nontender.  Chronic venous stasis changes/lymphedema of lower extremities with skin sloughing  Imaging: Ct Chest W Contrast  Result Date: 04/15/2019 CLINICAL DATA:  Shortness of breath and chest pain. History of basal cell carcinoma. EXAM: CT CHEST WITH CONTRAST TECHNIQUE: Multidetector CT imaging of the chest was performed during intravenous contrast administration. CONTRAST:  79mL OMNIPAQUE IOHEXOL 300 MG/ML  SOLN COMPARISON:  01/08/2019.  PET-CT dated 03/01/2019 FINDINGS: Cardiovascular: The heart size is stable from prior study. The main pulmonary artery is dilated measuring approximately 3.6 cm in diameter. There is no aortic aneurysm. No thoracic aortic dissection. Mediastinum/Nodes: --there are new pathologically enlarged mediastinal lymph nodes measuring up to approximately 2.3 cm in the short axis. There are pathologically enlarged left hilar lymph nodes. --No axillary lymphadenopathy. --No supraclavicular lymphadenopathy. --Normal thyroid gland. --The esophagus is unremarkable Lungs/Pleura: There is new near complete collapse of the left upper lobe.  There is consolidation at the left lung apex which may represent postobstructive pneumonia. The left upper lobe bronchus appears to be occluded. There is significant narrowing of the left lower lobe bronchus. There is some consolidation involving the left lower lobe with a small left-sided pleural effusion. The previously demonstrated left upper lobe lung mass appears to have substantially increased in size but is difficult to distinguish from the collapsed lung. An estimate is that the mass mass measures at least 9.2 x 7.6 cm. There are mild emphysematous changes in the right lung field. There is a growing spiculated 1 cm pulmonary nodule in the right upper lobe (axial series 5, image 54). There is a significant amount of debris within the trachea and right mainstem bronchus. There is a new pulmonary opacity in the anterior right upper lobe measuring approximately 1.7 cm (axial series 5, image 62). There is some atelectasis versus consolidation involving the right lower lobe. Upper Abdomen: There are new hypoattenuating masses in the liver measuring 1.9 cm in hepatic segment 4A and 1.7 cm in hepatic segment 4 B. Musculoskeletal: There is new destruction of the posterior fifth and sixth ribs on the left consistent with osseous involvement of the known lung mass. IMPRESSION: 1. New near complete collapse of the left upper lobe felt to be secondary to progression of the patient's known left upper lobe lung mass, compressing the left upper lobe bronchus. 2. Findings consistent with significant interval progression of disease as evidenced by new pathologically enlarged mediastinal lymph nodes as well as multiple new low-attenuation masses in the liver concerning for hepatic metastatic disease. In addition, there are new pulmonary opacities in the right upper and right lower lobes raising concern for additional foci of metastatic disease. 3. Consolidation involving the left lung apex concerning for postobstructive  pneumonia. 4. Moderate amount of debris within the trachea and bronchi bilaterally. 5. Significant narrowing of the left lower lobe bronchus. There is consolidation involving the left lower lobe with a small left-sided pleural effusion. 6. New osseous involvement involving the posterior fifth and sixth ribs on the left. Electronically Signed   By: Constance Holster M.D.   On: 04/16/2019 17:44   Dg Chest Port 1 View  Result Date: 04/15/2019 CLINICAL DATA:  Patient reports to the ED with complaint of SOB and chest pain. Patient has a hx of lymphoma and reports he has been receiving care. [name] legs have an odor and his skin is dry and flaky EXAM: PORTABLE CHEST 1 VIEW COMPARISON:  10/27/2018.  PET-CT, 03/01/2019. FINDINGS: Near complete opacification of the left hemithorax. This has developed since prior chest radiograph,  and since the more recent prior PET-CT. The opacification is consistent combination of the posterior left upper lobe hypermetabolic mass noted on the prior PET-CT in combination atelectasis or postobstructive consolidation. Pleural fluid is also suspected. Right lung is hyperexpanded, but otherwise clear. No right pleural effusion. No pneumothorax. IMPRESSION: 1. Significant interval worsening left lung aeration. Left hemithorax is now mostly opacified consistent with a combination of the presumed left upper lobe malignancy with postobstructive change/atelectasis and probable pleural fluid. Electronically Signed   By: Lajean Manes M.D.   On: 03/26/2019 14:25    Labs:  CBC: Recent Labs    10/27/18 0803 04/05/2019 1441 04/13/19 0129  WBC 7.5 8.1 7.0  HGB 14.4 14.7 15.7  HCT 46.6 45.6 47.8  PLT 204 153 146*    COAGS: No results for input(s): INR, APTT in the last 8760 hours.  BMP: Recent Labs    10/27/18 0803 04/03/2019 1350 04/13/19 0129 04/13/19 0830  NA 133* 143 140  --   K 5.4* 3.7 4.4  --   CL 103 104 104  --   CO2 19* 29 24  --   GLUCOSE 98 104* 167*  --    BUN 14 20 18   --   CALCIUM 8.2* 14.1* 12.3* 12.0*  CREATININE 0.83 1.02 1.07  --   GFRNONAA >60 >60 >60  --   GFRAA >60 >60 >60  --     LIVER FUNCTION TESTS: Recent Labs    10/27/18 0803 04/10/2019 1350 04/13/19 0129  BILITOT 0.4 1.1 1.5*  AST 24 35 43*  ALT 14 16 13   ALKPHOS 106 81 68  PROT 7.5 7.5 6.9  ALBUMIN 2.9* 3.6 3.2*    TUMOR MARKERS: No results for input(s): AFPTM, CEA, CA199, CHROMGRNA in the last 8760 hours.  Assessment and Plan: 63 y.o. male smoker with history of alcohol and IV drug abuse as well and severe COPD/known left lung mass who presented to [facility] 11/26 with COPD exacerbation secondary to postobstructive pneumonia.  He has had worsening shortness of breath along with intermittent fevers and chills.  He was COVID-19 negative.  Also with prior history of basal cell skin cancer, lower extremity edema/chronic venous stasis, prior PSVT and depression.  Work-up for left lung mass was done back in October but biopsy canceled because of COPD exacerbation.  PET scan at that time revealed: 1. Intensely hypermetabolic (max SUV 29.9) large poorly marginated 8.5 x 4.8 cm posterior left upper lobe lung mass with direct peripheral left chest wall invasion between the lateral left fifth and sixth ribs and direct left paraspinal invasion at the T4 level, significantly increased in size since 01/08/2019 chest CT, compatible with aggressive locally advanced primary bronchogenic carcinoma. 2. Hypermetabolic ipsilateral mediastinal lymph node metastases in the AP window and left paraesophageal chains. 3. Hypermetabolic distant nodal metastasis in the left external iliac chain. 4. Hypermetabolic posterior left chest wall metastasis between left sixth and seventh ribs. 5. Hypermetabolic osseous metastases to the left sixth rib. 6. PET-CT stage IVB (T4 N2 M1c). 7. New 5 mm right upper lobe pulmonary nodule and slight growth of 4 mm right middle lobe pulmonary nodule, below PET  resolution, cannot exclude pulmonary metastases. 8. Aortic Atherosclerosis (ICD10-I70.0) and Emphysema (ICD10-J43.9).  CT chest done yesterday revealed: 1. New near complete collapse of the left upper lobe felt to be secondary to progression of the patient's known left upper lobe lung mass, compressing the left upper lobe bronchus. 2. Findings consistent with significant interval progression of disease  as evidenced by new pathologically enlarged mediastinal lymph nodes as well as multiple new low-attenuation masses in the liver concerning for hepatic metastatic disease. In addition, there are new pulmonary opacities in the right upper and right lower lobes raising concern for additional foci of metastatic disease. 3. Consolidation involving the left lung apex concerning for postobstructive pneumonia. 4. Moderate amount of debris within the trachea and bronchi bilaterally. 5. Significant narrowing of the left lower lobe bronchus. There is consolidation involving the left lower lobe with a small left-sided pleural effusion. 6. New osseous involvement involving the posterior fifth and sixth ribs on the left.  He is currently afebrile, WBC normal, platelets 146K, creatinine normal, lactic acid 2.9, PT/INR pending.  Request now received for CT-guided biopsy of left lung mass versus soft tissue lesion between the left sixth and seventh ribs.  Imaging studies have been reviewed by Dr. Pascal Lux.  Details/risks of procedure, including but not limited to, internal bleeding, infection, injury to adjacent structures/pneumothorax discussed with patient with his understanding and consent.  Procedure scheduled for today.   Thank you for this interesting consult.  I greatly enjoyed meeting Omar Wagner and look forward to participating in their care.  A copy of this report was sent to the requesting provider on this date.  Electronically Signed: D. Rowe Robert, PA-C 04/13/2019, 9:47 AM   I spent a  total of 25 minutes    in face to face in clinical consultation, greater than 50% of which was counseling/coordinating care for CT-guided left lung mass and/or soft tissue lesion between left sixth and seventh ribs

## 2019-04-13 NOTE — Consult Note (Signed)
[facility] Nurse Consult Note: Reason for Consult:Lymphedema bilateral lower extremities.  Chronic skin changes to bilateral lower legs.  Wounds have healed. Weeping to lower legs, posteriorly present, but improving.  Wound type:  Chronic lymphedema Pressure Injury POA: NA Measurement: Edema and erythema from below knees to toes.  Wound bed: Dry flaky skin Drainage (amount, consistency, odor) moderate weeping  No odor.  Periwound: edema and erythema Dressing procedure/placement/frequency: Cleanse legs with soap and water and pat dry.  Ortho tech to wrap legs with weekly unna boots. Please remove Unna boots prior to discharge.  Patient will continue his removable compression garment at home.  Will not follow at this time.  Please re-consult if needed.  Domenic Moras MSN, RN, FNP-BC CWON Wound, Ostomy, Continence Nurse Pager [phone]

## 2019-04-14 ENCOUNTER — Inpatient Hospital Stay (HOSPITAL_COMMUNITY): Payer: Medicare Other

## 2019-04-14 DIAGNOSIS — R918 Other nonspecific abnormal finding of lung field: Secondary | ICD-10-CM | POA: Diagnosis not present

## 2019-04-14 DIAGNOSIS — E876 Hypokalemia: Secondary | ICD-10-CM

## 2019-04-14 DIAGNOSIS — R000 Tachycardia, unspecified: Secondary | ICD-10-CM

## 2019-04-14 DIAGNOSIS — J441 Chronic obstructive pulmonary disease with (acute) exacerbation: Secondary | ICD-10-CM | POA: Diagnosis not present

## 2019-04-14 DIAGNOSIS — J189 Pneumonia, unspecified organism: Secondary | ICD-10-CM | POA: Diagnosis not present

## 2019-04-14 DIAGNOSIS — T17908S Unspecified foreign body in respiratory tract, part unspecified causing other injury, sequela: Secondary | ICD-10-CM

## 2019-04-14 LAB — BASIC METABOLIC PANEL
Anion gap: 11 (ref 5–15)
BUN: 18 mg/dL (ref 8–23)
CO2: 27 mmol/L (ref 22–32)
Calcium: 10.4 mg/dL — ABNORMAL HIGH (ref 8.9–10.3)
Chloride: 104 mmol/L (ref 98–111)
Creatinine, Ser: 1.13 mg/dL (ref 0.61–1.24)
GFR calc Af Amer: >60 mL/min (ref >60)
GFR calc non Af Amer: >60 mL/min (ref >60)
Glucose, Bld: 99 mg/dL (ref 70–99)
Potassium: 3.4 mmol/L — ABNORMAL LOW (ref 3.5–5.1)
Sodium: 142 mmol/L (ref 135–145)

## 2019-04-14 LAB — POTASSIUM: Potassium: 2.9 mmol/L — ABNORMAL LOW (ref 3.5–5.1)

## 2019-04-14 LAB — CBC
HCT: 44.4 % (ref 39.0–52.0)
Hemoglobin: 14.3 g/dL (ref 13.0–17.0)
MCH: 36.9 pg — ABNORMAL HIGH (ref 26.0–34.0)
MCHC: 32.2 g/dL (ref 30.0–36.0)
MCV: 114.4 fL — ABNORMAL HIGH (ref 80.0–100.0)
Platelets: 156 10*3/uL (ref 150–400)
RBC: 3.88 MIL/uL — ABNORMAL LOW (ref 4.22–5.81)
RDW: 12.5 % (ref 11.5–15.5)
WBC: 13.3 10*3/uL — ABNORMAL HIGH (ref 4.0–10.5)
nRBC: 0 % (ref 0.0–0.2)

## 2019-04-14 LAB — MAGNESIUM
Magnesium: 1.5 mg/dL — ABNORMAL LOW (ref 1.7–2.4)
Magnesium: 2.5 mg/dL — ABNORMAL HIGH (ref 1.7–2.4)

## 2019-04-14 LAB — PARATHYROID HORMONE, INTACT (NO CA): PTH: 12 pg/mL — ABNORMAL LOW (ref 15–65)

## 2019-04-14 LAB — TROPONIN I (HIGH SENSITIVITY): Troponin I (High Sensitivity): 21 ng/L — ABNORMAL HIGH (ref <18)

## 2019-04-14 LAB — CALCIUM
Calcium: 10.4 mg/dL — ABNORMAL HIGH (ref 8.9–10.3)
Calcium: 9.9 mg/dL (ref 8.9–10.3)

## 2019-04-14 MED ORDER — SODIUM CHLORIDE 0.9% FLUSH
10.0000 mL | Freq: Two times a day (BID) | INTRAVENOUS | Status: DC
  Administered 2019-04-14 – 2019-04-19 (×10): 10 mL

## 2019-04-14 MED ORDER — MORPHINE SULFATE (PF) 2 MG/ML IV SOLN
INTRAVENOUS | Status: AC
  Filled 2019-04-14: qty 1

## 2019-04-14 MED ORDER — POTASSIUM CHLORIDE CRYS ER 20 MEQ PO TBCR
40.0000 meq | EXTENDED_RELEASE_TABLET | Freq: Once | ORAL | Status: AC
  Administered 2019-04-14: 40 meq via ORAL
  Filled 2019-04-14: qty 2

## 2019-04-14 MED ORDER — MAGNESIUM SULFATE 4 GM/100ML IV SOLN
4.0000 g | Freq: Once | INTRAVENOUS | Status: AC
  Administered 2019-04-14: 4 g via INTRAVENOUS
  Filled 2019-04-14: qty 100

## 2019-04-14 MED ORDER — MORPHINE SULFATE (PF) 2 MG/ML IV SOLN
2.0000 mg | Freq: Once | INTRAVENOUS | Status: AC
  Administered 2019-04-14: 2 mg via INTRAVENOUS
  Filled 2019-04-14: qty 1

## 2019-04-14 MED ORDER — POTASSIUM CHLORIDE CRYS ER 20 MEQ PO TBCR
40.0000 meq | EXTENDED_RELEASE_TABLET | Freq: Two times a day (BID) | ORAL | Status: DC
  Filled 2019-04-14: qty 2

## 2019-04-14 MED ORDER — POTASSIUM CHLORIDE 10 MEQ/100ML IV SOLN
10.0000 meq | INTRAVENOUS | Status: AC
  Administered 2019-04-14 – 2019-04-15 (×4): 10 meq via INTRAVENOUS
  Filled 2019-04-14 (×4): qty 100

## 2019-04-14 MED ORDER — SODIUM CHLORIDE 0.9% FLUSH: 10.0000 mL | INTRAVENOUS | Status: DC | PRN

## 2019-04-14 MED ORDER — MORPHINE SULFATE (PF) 2 MG/ML IV SOLN
2.0000 mg | Freq: Once | INTRAVENOUS | Status: AC
  Administered 2019-04-14: 20:00:00 2 mg via INTRAVENOUS

## 2019-04-14 MED ORDER — DILTIAZEM HCL 25 MG/5ML IV SOLN
10.0000 mg | INTRAVENOUS | Status: DC | PRN
  Filled 2019-04-14: qty 5

## 2019-04-14 NOTE — Progress Notes (Signed)
OT Cancellation Note  Patient Details Name: Omar Wagner MRN: [id number] DOB: 12-12-1955   Cancelled Treatment:    Reason Eval/Treat Not Completed: Other (comment).  Pt's RR in mid 30s at rest. Will check back.  Avonell Lenig 04/14/2019, 9:19 AM  Lesle Chris, OTR/L Acute Rehabilitation Services [phone] [facility] pager [phone] office 04/14/2019

## 2019-04-14 NOTE — Evaluation (Signed)
Occupational Therapy Evaluation Patient Details Name: Omar Wagner MRN: [id number] DOB: 09-Oct-1955 Today's Date: 04/14/2019    History of Present Illness 63 year old man was admitted for SOB.  PMH:  COPD, chronic lymphadema and known L lung mass, for which he had biopsy done on 11/27   Clinical Impression   Pt was admitted for the above.  Pt got OOB and walked to door; limited by fatique.  He is a vague historian but states that he was independent prior to admission and didn't use AD. Will follow in acute setting with supervision level goals.     Follow Up Recommendations  Supervision/Assistance - 24 hour    Equipment Recommendations  (? 3:1)    Recommendations for Other Services       Precautions / Restrictions Precautions Precautions: Fall Restrictions Weight Bearing Restrictions: No      Mobility Bed Mobility Overal bed mobility: Needs Assistance Bed Mobility: Supine to Sit     Supine to sit: Supervision     General bed mobility comments: for lines  Transfers Overall transfer level: Needs assistance Equipment used: Rolling walker (2 wheeled) Transfers: Sit to/from Stand Sit to Stand: Min assist;+2 safety/equipment         General transfer comment: cues for hand placement    Balance                                           ADL either performed or assessed with clinical judgement   ADL Overall ADL's : Needs assistance/impaired Eating/Feeding: Independent   Grooming: Set up   Upper Body Bathing: Minimal assistance   Lower Body Bathing: Moderate assistance   Upper Body Dressing : Minimal assistance   Lower Body Dressing: Maximal assistance   Toilet Transfer: Minimal assistance;+2 for safety/equipment;Ambulation;RW(chair)   Toileting- Clothing Manipulation and Hygiene: Minimal assistance         General ADL Comments: based on clinical judgment; pt fatiqued easily; had been in bed for several days     Vision          Perception     Praxis      Pertinent Vitals/Pain Pain Assessment: No/denies pain     Hand Dominance     Extremity/Trunk Assessment Upper Extremity Assessment Upper Extremity Assessment: Overall WFL for tasks assessed           Communication Communication Communication: No difficulties   Cognition Arousal/Alertness: Awake/alert Behavior During Therapy: WFL for tasks assessed/performed Overall Cognitive Status: No family/caregiver present to determine baseline cognitive functioning                                 General Comments: extra processing time, repeated cues to initiate and cues for safety when up walking--pt started backing up to get to chair when he was asked to walk forward and let us know when he needed to sit, vague historian   General Comments       Exercises     Shoulder Instructions      Home Living Family/patient expects to be discharged to:: Private residence Living Arrangements: Non-relatives/Friends(girlfriend)     Home Access: Stairs to enter Entrance Stairs-Number of Steps: 2   Home Layout: One level     Bathroom Shower/Tub: Teacher, early years/pre: Standard     Home Equipment: None  Prior Functioning/Environment Level of Independence: Independent with assistive device(s)                 OT Problem List: Decreased strength;Decreased activity tolerance;Impaired balance (sitting and/or standing);Decreased cognition;Decreased safety awareness;Decreased knowledge of use of DME or AE      OT Treatment/Interventions: Self-care/ADL training;Energy conservation;DME and/or AE instruction;Therapeutic activities;Cognitive remediation/compensation;Patient/family education;Balance training    OT Goals(Current goals can be found in the care plan section) Acute Rehab OT Goals Patient Stated Goal: agreeable to OOB OT Goal Formulation: With patient Time For Goal Achievement: 04/28/19 Potential to Achieve  Goals: Good ADL Goals Pt Will Transfer to Toilet: with supervision;ambulating;bedside commode Pt Will Perform Toileting - Clothing Manipulation and hygiene: with supervision;sit to/from stand Additional ADL Goal #1: pt will perform adl with set up supervision, using ae as needed for energy conservation  OT Frequency: Min 2X/week   Barriers to D/C:            Co-evaluation PT/OT/SLP Co-Evaluation/Treatment: Yes Reason for Co-Treatment: For patient/therapist safety PT goals addressed during session: Mobility/safety with mobility OT goals addressed during session: ADL's and self-care      AM-PAC OT "6 Clicks" Daily Activity     Outcome Measure Help from another person eating meals?: None Help from another person taking care of personal grooming?: A Little Help from another person toileting, which includes using toliet, bedpan, or urinal?: A Little Help from another person bathing (including washing, rinsing, drying)?: A Lot Help from another person to put on and taking off regular upper body clothing?: A Little Help from another person to put on and taking off regular lower body clothing?: A Lot 6 Click Score: 17   End of Session    Activity Tolerance: Patient limited by fatigue Patient left: in chair;with call bell/phone within reach;with chair alarm set  OT Visit Diagnosis: Unsteadiness on feet (R26.81)                Time: 3419-6222 OT Time Calculation (min): 22 min Charges:  OT General Charges $OT Visit: 1 Visit OT Evaluation $OT Eval Low Complexity: [place], OTR/L Acute Rehabilitation Services [phone] [facility] pager [phone] office 04/14/2019  [place] 04/14/2019, 12:28 PM

## 2019-04-14 NOTE — Progress Notes (Signed)
Pt HR sustaining 40s. MD notified.

## 2019-04-14 NOTE — Progress Notes (Signed)
Patient HR sustained greater than 200s. [facility] paged and [place] notified. MD ordered stat EKG (in [facility]), 2mg  morphine x1 dose, and troponin. RN administered. Pt placed on venti mask at 40% O2 Sats at 92%. RN will continue to monitor.

## 2019-04-14 NOTE — Consult Note (Signed)
NAME:  Omar Wagner, MRN:  [id number], DOB:  03-25-1956, LOS: 2 ADMISSION DATE:  04/01/2019, CONSULTATION DATE:  04/13/19 REFERRING MD: Louellen Molder, MD CHIEF COMPLAINT:  Lung mass with mets  Brief History   63 year old male with very severe COPD (FEV1 33%) who presents with acute hypoxemic respiratory failure secondary to post-obstructive pneumonia in setting of LUL compression/collapse from left lung mass  History of present illness   [name] is a 63 year old male active smoker with very severe COPD (FEV1 33%) and known lung mass who presents for COPD exacerbation secondary to postobstructive pneumonia.  He reports that for the last 2 weeks he has not had any maintenance inhalers due to cost.  He was previously on Trelegy after running out, he developed worsening shortness of breath, productive cough with yellow sputum wheezing.  He is unable to walk to the bathroom without needing to stop and rest.  He reports needing his albuterol inhaler more than 10 times a day for symptoms.  Also endorses fevers and chills in the last 2 days.  His dyspnea has worsened in the last week and has shortness of breath at rest.  Conservative management review of imaging he has had worsening, since 01/08/2019 patient has had known posterior lung or upper lobe mass concerning for bronchogenic neoplasm. PET scan on 03/01/19 demonstrated enlarging and intensely hypermetabolic posterior lung mass measuring 8.5 x4.8 cm with direct invasion between the lateral left 5th and 6th ribs and direct paraspinal invasion at the T4 level (previously measured 3.9 x 5.9 cm mass abutting the left posterior chest without invasion). His pulmonologist Dr. Melvyn Novas had planned for IR biopsy for tissue diagnosis however this has not yet been done.  In the ED, he was hypoxemic to 87% and required 3-4L O2 with improvement to 92%. He was started on antibiotics, scheduled bronchodilators and steroids and admitted to [facility] for COPD  exacerbation. PCCM was consulted regarding lung mass.  Past Medical History  Very severe COPD (FEV1 33%) in 2018 Depression Hx basal cell [facility] Events   11/26 Admitted 11/27 CT guided biopsy of soft tissue nodule in the left posterior chest wall  Consults:  [facility] IR PCCM Rad/Onc  Procedures:    Significant Diagnostic Tests:  PET 03/01/19 1. 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 2. Hypermetabolic ipsilateral mediastinal lymph node metastases in the AP window and left paraesophageal chains. 3. Hypermetabolic distant nodal metastasis in the left external iliac chain. 4. Hypermetabolic posterior left chest wall metastasis between left sixth and seventh ribs. 5. Hypermetabolic osseous metastases to the left sixth rib. 6. PET-CT stage IVB (T4 N2 M1c). 7. New 5 mm right upper lobe pulmonary nodule and slight growth of 4 mm right middle lobe pulmonary nodule, below PET resolution, cannot exclude pulmonary metastases. 8. Aortic Atherosclerosis (ICD10-I70.0) and Emphysema (ICD10-J43.9).  CT Chest W Contrast 03/27/2019 1. New near complete collapse of the left upper lobe felt to be secondary to progression of the patient's known left upper lobe lung mass, compressing the left upper lobe bronchus. 2. Findings consistent with significant interval progression of disease as evidenced by new pathologically enlarged mediastinal lymph nodes as well as multiple new  low-attenuation masses in the liver concerning for hepatic metastatic disease. In addition, there are new pulmonary opacities in the right upper and  right lower lobes raising concern for additional foci of metastatic disease. 3. Consolidation involving the left lung apex concerning for postobstructive pneumonia. 4. Moderate amount of debris within the trachea and bronchi bilaterally. 5. Significant narrowing of the left lower lobe bronchus. There is consolidation involving the left lower lobe with a small left-sided pleural effusion. 6. New osseous involvement involving the posterior fifth and sixth ribs on the left.  Micro Data:  BCx 04/08/2019 Sputum Cx 04/13/19  Antimicrobials:  Vanc 11/26> Cefepime 11/26> Ceftriaxone 11/26> Azithro 11/26>  Interim history/subjective:  S/p CT soft tissue biopsy yesterday  Objective   Blood pressure (!) 164/102, pulse (!) 103, temperature (!) 97.4 F (36.3 C), temperature source Oral, resp. rate (!) 24, height 6\' 1"  (1.854 m), weight 80.6 kg, SpO2 97 %.        Intake/Output Summary (Last 24 hours) at 04/14/2019 1356 Last data filed at 04/14/2019 0644 Gross per 24 hour  Intake 2240.58 ml  Output 1275 ml  Net 965.58 ml   Filed Weights   04/13/2019 2130 04/13/19 0540  Weight: 81.9 kg 80.6 kg   Physical Exam: General: Chronically ill-appearing, no acute distress HENT: [place], AT, OP clear, MMM Eyes: EOMI, no scleral icterus Respiratory: Diminished breath sounds bilaterally. Absent LUL breath sounds. No crackles, wheezing or rales Cardiovascular: RRR, -M/R/G, no JVD GI: BS+, soft, nontender Extremities: Lower extremity lymphedema, flaky, sloughing skin Neuro: AAO x4, CNII-XII grossly intact Skin: Intact, no rashes or bruising Psych: Normal mood, normal affect  Resolved Hospital Problem list   N/A  Assessment & Plan:  63 year old male with very severe COPD (FEV1 33%) admitted for COPD exacerbation secondary to postobstructive pneumonia. Recent PET and CT imaging reviewed personally and interpreted by me which demonstrates progressive left upper lobe lung mass, now with chest wall  invasion and left upper lobe collapse and suspected extrinsic compression of the airways.  Also concern for interval development of hepatic metastatic lesions. S/p CT-guided biopsy of soft tissue nodule in the left posterior chest wall on 04/13/19.   Left upper lobe lung mass with collapse extrinsic compression of airways Await pathology results Rad Onc planning for palliative radiation once diagnosis is confirmed Chest PT/Flutter valve  COPD exacerbation Acute hypoxemic respiratory failure Postobstructive pneumonia Wean supplemental oxygen for goal SpO2 88-92%. Prior to discharge please perform ambulatory O2 to determine home O2 needs. Recommend community-acquired pneumonia coverage x 7 days Follow-up sputum culture Prednisone x5 days Continue Dulera to 200-5 mcg 2 puff BID Duonebs q6h Aggressive pulmonary toilet with bronchodilators, CPT and flutter valve  Pulmonary will sign off. Call as needed. Please contact team to arrange for follow-up with Dr. Melvyn Novas when patient is discharged. Thank you for involving [place] in the care of your patient.  Rodman Pickle, M.D. [facility] Pulmonary/Critical Care Medicine 04/14/2019 2:25 PM   Best practice:  Diet: Per primary team Pain/Anxiety/Delirium protocol (if indicated): N/A VAP protocol (if indicated): N/A DVT prophylaxis: Hold anticoagulation in setting of procedure GI prophylaxis: Per primary Glucose control: Per primary Mobility: Mobility as tolerate Code Status: Full Family Communication: Updated patient on 11/27 Disposition: Per Primary Team  Labs   CBC: Recent Labs  Lab 03/22/2019 1441 04/13/19 0129 04/14/19 0023  WBC 8.1 7.0 13.3*  NEUTROABS 6.8  --   --   HGB 14.7 15.7 14.3  HCT 45.6 47.8 44.4  MCV 111.5* 111.9* 114.4*  PLT 153 146* 672    Basic Metabolic Panel: Recent Labs  Lab 03/22/2019 1350 04/13/19 0129 04/13/19 0830 04/13/19 1558 04/14/19 0023 04/14/19 0744  NA 143 140  --   --  142  --   K 3.7 4.4  --   --  3.4*   --   CL 104 104  --   --  104  --   CO2 29 24  --   --  27  --   GLUCOSE 104* 167*  --   --  99  --   BUN 20 18  --   --  18  --   CREATININE 1.02 1.07  --   --  1.13  --   CALCIUM 14.1* 12.3* 12.0* 11.2* 10.4* 10.4*  MG  --  1.7  --   --   --  1.5*  PHOS  --  2.7  --   --   --   --    GFR: Estimated Creatinine Clearance: 73.7 mL/min (by C-G formula based on SCr of 1.13 mg/dL). Recent Labs  Lab 03/28/2019 1350 04/01/2019 1441 04/13/19 0129 04/14/19 0023  WBC  --  8.1 7.0 13.3*  LATICACIDVEN 2.2*  --  2.9*  --     Liver Function Tests: Recent Labs  Lab 04/05/2019 1350 04/13/19 0129  AST 35 43*  ALT 16 13  ALKPHOS 81 68  BILITOT 1.1 1.5*  PROT 7.5 6.9  ALBUMIN 3.6 3.2*   No results for input(s): LIPASE, AMYLASE in the last 168 hours. No results for input(s): AMMONIA in the last 168 hours.  ABG    Component Value Date/Time   HCO3 31.9 (H) 04/16/2019 1517   TCO2 26.1 01/11/2013 0610   O2SAT 69.2 04/05/2019 1517     Coagulation Profile: Recent Labs  Lab 04/13/19 0957  INR 1.1    Cardiac Enzymes: No results for input(s): CKTOTAL, CKMB, CKMBINDEX, TROPONINI in the last 168 hours.  HbA1C: No results found for: HGBA1C  CBG: Recent Labs  Lab 04/13/19 [place]     Rodman Pickle, M.D. [facility] Pulmonary/Critical Care Medicine 04/14/2019 1:56 PM

## 2019-04-14 NOTE — Progress Notes (Signed)
PROGRESS NOTE                                                                                                                                                                                                             Patient Demographics:    Omar Wagner, is a 63 y.o. male, DOB - 10/14/1955, RDE:[id number]  Admit date - 04/03/2019   Admitting Physician Toy Baker, MD  Outpatient Primary MD for the patient is [name], [name], NP  LOS - 2  Outpatient Specialists: Dr. Melvyn Novas (pulmonary)  Chief Complaint  Patient presents with  . Shortness of Breath       Brief Narrative 63 year old male with severe COPD, active tobacco use, chronic lymphedema, and known left lung mass who had a PET scan done 1 month back showing enlarging posterior lung mass with invasion into the ribs and thoracic spine.  He was scheduled for CT biopsy of the mass on 10/29 but got canceled as patient felt like having a COPD exacerbation. He now presents to the ED with progressive shortness of breath and cough.  In the ED he was hypoxemic in the 80s requiring 4 L via nasal cannula.  In the ED he was tachypneic, afebrile, O2 sat improved to 92% on 4 L.  He had a CT of the chest with contrast done showing left lung mass with obstruction and complete opacification of the left hemithorax suspected secondary to postobstructive pneumonia with lung collapse.  Also showed metastatic spread to the liver. Placed on empiric antibiotics and admitted to stepdown unit.  COVID-19 was tested negative.    Subjective:   Still hypoxic requiring 5 L via nasal cannula.  Reports symptoms unchanged from yesterday.  Had CT biopsy of the left lung mass yesterday.   Assessment  & Plan :   Principal problem Acute respiratory failure with hypoxemia (HCC) Secondary to postoperative pneumonia due to rapidly enlarging left lung mass along with mild COPD exacerbation. Continue  monitor in stepdown.  O2 sat remains in the low 90s requiring 5 L via nasal cannula.  Empiric Zosyn.  Blood cultures negative so far.  Continue oral prednisone and scheduled nebs.   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  IR consult appreciated.  CT-guided biopsy of the left lung mass done on  11/27. Radiation oncology consult appreciated and plan on simulation/treatment planning on 11/30.  Hypercalcemia of malignancy Given a dose of zoledronate on admission.  Improving with IV hydration.  PTH and PTH related peptide level ordered.   Sinus tachycardia/ SVTs As needed Cardizem.  Replenished low potassium.  Check magnesium.  Chronic lymphedema with multiple lower leg open wounds Has erythema over the legs.  Wound care consulted.    COPD Gold 3 Has low FEV1 (33%) as per last PFT 2 years ago).  Active tobacco use.  Ordered nicotine patch.  Treatment as above.      Code Status : Full code  Family Communication  : None  Disposition Plan  : Pending hospital course.  Condition guarded.  Barriers For Discharge : Active symptoms  Consults  : Pulmonary, IR, radiation oncology  Procedures  : CT chest  DVT Prophylaxis  :  Lovenox -  Lab Results  Component Value Date   PLT 156 04/14/2019    Antibiotics  :    Anti-infectives (From admission, onward)   Start     Dose/Rate Route Frequency Ordered Stop   04/01/2019 2200  piperacillin-tazobactam (ZOSYN) IVPB 3.375 g     3.375 g 12.5 mL/hr over 240 Minutes Intravenous Every 8 hours 03/22/2019 2033     03/22/2019 1900  vancomycin (VANCOCIN) 1,750 mg in sodium chloride 0.9 % 500 mL IVPB     1,750 mg 250 mL/hr over 120 Minutes Intravenous  Once 04/15/2019 1830 04/15/2019 2200   04/02/2019 1830  ceFEPIme (MAXIPIME) 1 g in sodium chloride 0.9 % 100 mL IVPB  Status:  Discontinued     1 g 200 mL/hr over 30 Minutes Intravenous  Once 04/01/2019 1828 03/30/2019 1829   04/11/2019 1830  ceFEPIme  (MAXIPIME) 2 g in sodium chloride 0.9 % 100 mL IVPB     2 g 200 mL/hr over 30 Minutes Intravenous  Once 04/14/2019 1829 04/11/2019 1940   04/03/2019 1800  cefTRIAXone (ROCEPHIN) 1 g in sodium chloride 0.9 % 100 mL IVPB  Status:  Discontinued     1 g 200 mL/hr over 30 Minutes Intravenous  Once 04/10/2019 1748 03/26/2019 1842   04/14/2019 1800  azithromycin (ZITHROMAX) 500 mg in sodium chloride 0.9 % 250 mL IVPB  Status:  Discontinued     500 mg 250 mL/hr over 60 Minutes Intravenous  Once 03/29/2019 1748 04/16/2019 1940        Objective:   Vitals:   04/14/19 0500 04/14/19 0751 04/14/19 0756 04/14/19 0846  BP:  (!) 164/102    Pulse: 100 (!) 103    Resp: (!) 29 (!) 24    Temp:    (!) 97.4 F (36.3 C)  TempSrc:    Oral  SpO2: 95% 94% 97%   Weight:      Height:        Wt Readings from Last 3 Encounters:  04/13/19 80.6 kg  10/30/18 84.4 kg  10/27/18 83.9 kg     Intake/Output Summary (Last 24 hours) at 04/14/2019 1140 Last data filed at 04/14/2019 3295 Gross per 24 hour  Intake 2240.58 ml  Output 1275 ml  Net 965.58 ml   Physical exam Elderly male in some respiratory distress HEENT: Moist mucosa, supple neck Chest: Diminished left-sided breath sounds, scattered rhonchi CVs: Normal S1-S2, no murmurs GI: Soft, nondistended, nontender Musculoskeletal: Warm, bilateral erythema (R >L) with chronic lymphedema and multiple lower extremity wounds       Data Review:    CBC Recent Labs  Lab 04/10/2019 1441 04/13/19 0129 04/14/19 0023  WBC 8.1 7.0 13.3*  HGB 14.7 15.7 14.3  HCT 45.6 47.8 44.4  PLT 153 146* 156  MCV 111.5* 111.9* 114.4*  MCH 35.9* 36.8* 36.9*  MCHC 32.2 32.8 32.2  RDW 12.3 12.5 12.5  LYMPHSABS 0.7  --   --   MONOABS 0.5  --   --   EOSABS 0.0  --   --   BASOSABS 0.0  --   --     Chemistries  Recent Labs  Lab 03/28/2019 1350 04/13/19 0129 04/13/19 0830 04/13/19 1558 04/14/19 0023 04/14/19 0744  NA 143 140  --   --  142  --   K 3.7 4.4  --   --  3.4*  --    CL 104 104  --   --  104  --   CO2 29 24  --   --  27  --   GLUCOSE 104* 167*  --   --  99  --   BUN 20 18  --   --  18  --   CREATININE 1.02 1.07  --   --  1.13  --   CALCIUM 14.1* 12.3* 12.0* 11.2* 10.4* 10.4*  MG  --  1.7  --   --   --   --   AST 35 43*  --   --   --   --   ALT 16 13  --   --   --   --   ALKPHOS 81 68  --   --   --   --   BILITOT 1.1 1.5*  --   --   --   --    ------------------------------------------------------------------------------------------------------------------ No results for input(s): CHOL, HDL, LDLCALC, TRIG, CHOLHDL, LDLDIRECT in the last 72 hours.  No results found for: HGBA1C ------------------------------------------------------------------------------------------------------------------ Recent Labs    04/13/19 0129  TSH 0.179*   ------------------------------------------------------------------------------------------------------------------ No results for input(s): VITAMINB12, FOLATE, FERRITIN, TIBC, IRON, RETICCTPCT in the last 72 hours.  Coagulation profile Recent Labs  Lab 04/13/19 0957  INR 1.1    No results for input(s): DDIMER in the last 72 hours.  Cardiac Enzymes No results for input(s): CKMB, TROPONINI, MYOGLOBIN in the last 168 hours.  Invalid input(s): CK ------------------------------------------------------------------------------------------------------------------    Component Value Date/Time   BNP 84.3 04/05/2019 1458    Inpatient Medications  Scheduled Meds: . Chlorhexidine Gluconate Cloth  6 each Topical Daily  . ipratropium-albuterol  3 mL Nebulization Q6H  . mouth rinse  15 mL Mouth Rinse BID  . metoprolol tartrate  25 mg Oral BID  . mometasone-formoterol  2 puff Inhalation BID  . montelukast  10 mg Oral Daily  . predniSONE  40 mg Oral Q breakfast  . sodium chloride flush  3 mL Intravenous Q12H   Continuous Infusions: . sodium chloride 125 mL/hr at 04/14/19 0742  . piperacillin-tazobactam (ZOSYN)   IV Stopped (04/14/19 0904)   PRN Meds:.acetaminophen **OR** acetaminophen, albuterol, diltiazem, HYDROcodone-acetaminophen, LORazepam, ondansetron **OR** ondansetron (ZOFRAN) IV, sodium chloride flush  Micro Results Recent Results (from the past 240 hour(s))  Blood culture (routine x 2)     Status: None (Preliminary result)   Collection Time: 04/11/2019  2:17 PM   Specimen: BLOOD  Result Value Ref Range Status   Specimen Description   Final    BLOOD LEFT ANTECUBITAL Performed at [facility], [place] [address], [place], [place] [address]    Special Requests   Final  BOTTLES DRAWN AEROBIC AND ANAEROBIC Blood Culture adequate volume Performed at [facility] [address] [place], [place] [address]    Culture   Final    NO GROWTH < 24 HOURS Performed at [facility] [address], [place], [place] [address]    Report Status PENDING  Incomplete  SARS CORONAVIRUS 2 (TAT 6-24 HRS) Nasopharyngeal Nasopharyngeal Swab     Status: None   Collection Time: 03/25/2019  3:37 PM   Specimen: Nasopharyngeal Swab  Result Value Ref Range Status   SARS Coronavirus 2 NEGATIVE NEGATIVE Final    Comment: (NOTE) SARS-CoV-2 target nucleic acids are NOT DETECTED. The SARS-CoV-2 RNA is generally detectable in upper and lower respiratory specimens during the acute phase of infection. Negative results do not preclude SARS-CoV-2 infection, do not rule out co-infections with other pathogens, and should not be used as the sole basis for treatment or other patient management decisions. Negative results must be combined with clinical observations, patient history, and epidemiological information. The expected result is Negative. Fact Sheet for Patients: SugarRoll.be Fact Sheet for Healthcare Providers: https://www.woods-mathews.com/ This test is not yet approved or cleared by the [place] FDA and  has been authorized for detection  and/or diagnosis of SARS-CoV-2 by FDA under an Emergency Use Authorization (EUA). This EUA will remain  in effect (meaning this test can be used) for the duration of the COVID-19 declaration under Section 56 4(b)(1) of the Act, 21 U.S.C. section 360bbb-3(b)(1), unless the authorization is terminated or revoked sooner. Performed at [facility], [place] [address], [place], [place] [address]   MRSA PCR Screening     Status: None   Collection Time: 03/22/2019  9:30 PM   Specimen: Nasal Mucosa; Nasopharyngeal  Result Value Ref Range Status   MRSA by PCR NEGATIVE NEGATIVE Final    Comment:        The GeneXpert MRSA Assay (FDA approved for NASAL specimens only), is one component of a comprehensive MRSA colonization surveillance program. It is not intended to diagnose MRSA infection nor to guide or monitor treatment for MRSA infections. Performed at [facility], [place] [address] [place], [place] [address]   Blood culture (routine x 2)     Status: None (Preliminary result)   Collection Time: 04/10/2019  9:37 PM   Specimen: BLOOD  Result Value Ref Range Status   Specimen Description   Final    BLOOD LEFT WRIST Performed at [facility] [address], [place], [place] [address]    Special Requests   Final    BOTTLES DRAWN AEROBIC AND ANAEROBIC Blood Culture adequate volume Performed at [facility] [address] [place], [place] [address]    Culture   Final    NO GROWTH < 12 HOURS Performed at [facility] [address], [place],  [address]    Report Status PENDING  Incomplete    Radiology Reports Ct Chest W Contrast  Result Date: 04/10/2019 CLINICAL DATA:  Shortness of breath and chest pain. History of basal cell carcinoma. EXAM: CT CHEST WITH CONTRAST TECHNIQUE: Multidetector CT imaging of the chest was performed during intravenous contrast administration. CONTRAST:  62mL OMNIPAQUE IOHEXOL 300 MG/ML   SOLN COMPARISON:  01/08/2019.  PET-CT dated 03/01/2019 FINDINGS: Cardiovascular: The heart size is stable from prior study. The main pulmonary artery is dilated measuring approximately 3.6 cm in diameter. There is no aortic aneurysm. No thoracic aortic dissection. Mediastinum/Nodes: --there are  new pathologically enlarged mediastinal lymph nodes measuring up to approximately 2.3 cm in the short axis. There are pathologically enlarged left hilar lymph nodes. --No axillary lymphadenopathy. --No supraclavicular lymphadenopathy. --Normal thyroid gland. --The esophagus is unremarkable Lungs/Pleura: There is new near complete collapse of the left upper lobe. There is consolidation at the left lung apex which may represent postobstructive pneumonia. The left upper lobe bronchus appears to be occluded. There is significant narrowing of the left lower lobe bronchus. There is some consolidation involving the left lower lobe with a small left-sided pleural effusion. The previously demonstrated left upper lobe lung mass appears to have substantially increased in size but is difficult to distinguish from the collapsed lung. An estimate is that the mass mass measures at least 9.2 x 7.6 cm. There are mild emphysematous changes in the right lung field. There is a growing spiculated 1 cm pulmonary nodule in the right upper lobe (axial series 5, image 54). There is a significant amount of debris within the trachea and right mainstem bronchus. There is a new pulmonary opacity in the anterior right upper lobe measuring approximately 1.7 cm (axial series 5, image 62). There is some atelectasis versus consolidation involving the right lower lobe. Upper Abdomen: There are new hypoattenuating masses in the liver measuring 1.9 cm in hepatic segment 4A and 1.7 cm in hepatic segment 4 B. Musculoskeletal: There is new destruction of the posterior fifth and sixth ribs on the left consistent with osseous involvement of the known lung mass.  IMPRESSION: 1. New near complete collapse of the left upper lobe felt to be secondary to progression of the patient's known left upper lobe lung mass, compressing the left upper lobe bronchus. 2. Findings consistent with significant interval progression of disease as evidenced by new pathologically enlarged mediastinal lymph nodes as well as multiple new low-attenuation masses in the liver concerning for hepatic metastatic disease. In addition, there are new pulmonary opacities in the right upper and right lower lobes raising concern for additional foci of metastatic disease. 3. Consolidation involving the left lung apex concerning for postobstructive pneumonia. 4. Moderate amount of debris within the trachea and bronchi bilaterally. 5. Significant narrowing of the left lower lobe bronchus. There is consolidation involving the left lower lobe with a small left-sided pleural effusion. 6. New osseous involvement involving the posterior fifth and sixth ribs on the left. Electronically Signed   By: Constance Holster M.D.   On: 03/21/2019 17:44   Ct Biopsy  Result Date: 04/13/2019 INDICATION: Concern for metastatic lung cancer. Please perform CT-guided biopsy of enlarging hypermetabolic soft tissue between the posterior aspect of left sixth and seventh ribs for tissue diagnostic purposes. EXAM: CT-GUIDED BIOPSY OF HYPERMETABOLIC SOFT TISSUE BETWEEN THE POSTERIOR ASPECTS OF THE LEFT SIXTH AND SEVENTH RIBS. COMPARISON:  Chest CT-03/30/2019; 01/08/2019; PET-CT-03/01/2019 MEDICATIONS: None. ANESTHESIA/SEDATION: Fentanyl 100 mcg IV; Versed 2 mg IV Sedation time: 14 minutes; The patient was continuously monitored during the procedure by the interventional radiology nurse under my direct supervision. CONTRAST:  None. COMPLICATIONS: None immediate. PROCEDURE: Informed consent was obtained from the patient following an explanation of the procedure, risks, benefits and alternatives. A time out was performed prior to the  initiation of the procedure. The patient was positioned prone on the CT table and a limited CT was performed for procedural planning demonstrating unchanged size and appearance of the ill-defined infiltrative soft tissue involving the left posterior chest wall between the left sixth and seventh ribs with dominant soft tissue nodule measuring approximately 2.3  x 1.6 cm (image 17, series 2), previously found to be hypermetabolic on preceding PET-CT. The procedure was planned. The operative site was prepped and draped in the usual sterile fashion. Appropriate trajectory was confirmed with a 22 gauge spinal needle after the adjacent tissues were anesthetized with 1% Lidocaine with epinephrine. Under intermittent CT guidance, a 17 gauge coaxial needle was advanced into the peripheral aspect of the mass. Appropriate positioning was confirmed and 5 core needle biopsy samples were obtained with an 18 gauge core needle biopsy device. The co-axial needle was removed and hemostasis was achieved with manual compression. A limited postprocedural CT was negative for pneumothorax, hemorrhage or additional complication. A dressing was placed. The patient tolerated the procedure well without immediate postprocedural complication. IMPRESSION: Technically successful CT guided core needle biopsy of infiltrative hypermetabolic soft tissue nodule between the posterior aspects of the left sixth and seventh ribs. Electronically Signed   By: Sandi Mariscal M.D.   On: 04/13/2019 13:53   Dg Chest Port 1 View  Result Date: 03/25/2019 CLINICAL DATA:  Patient reports to the ED with complaint of SOB and chest pain. Patient has a hx of lymphoma and reports he has been receiving care. [name] legs have an odor and his skin is dry and flaky EXAM: PORTABLE CHEST 1 VIEW COMPARISON:  10/27/2018.  PET-CT, 03/01/2019. FINDINGS: Near complete opacification of the left hemithorax. This has developed since prior chest radiograph, and since the more  recent prior PET-CT. The opacification is consistent combination of the posterior left upper lobe hypermetabolic mass noted on the prior PET-CT in combination atelectasis or postobstructive consolidation. Pleural fluid is also suspected. Right lung is hyperexpanded, but otherwise clear. No right pleural effusion. No pneumothorax. IMPRESSION: 1. Significant interval worsening left lung aeration. Left hemithorax is now mostly opacified consistent with a combination of the presumed left upper lobe malignancy with postobstructive change/atelectasis and probable pleural fluid. Electronically Signed   By: Lajean Manes M.D.   On: 04/11/2019 14:25    Time Spent in minutes 35   Rachelle Edwards M.D on 04/14/2019 at 11:40 AM  Between 7am to 7pm - Pager - [phone]  After 7pm go to [email or web address] - password [facility]  [facility] -  Office  [phone]

## 2019-04-14 NOTE — Evaluation (Signed)
Physical Therapy Evaluation Patient Details Name: Omar Wagner MRN: [id number] DOB: [birth year] Today's Date: 04/14/2019   History of Present Illness  63 year old man was admitted for SOB.  PMH:  COPD, chronic lymphadema and known L lung mass, for which he had biopsy done on 11/27  Clinical Impression  Pt admitted as above and presenting with functional mobility limitations 2* poor endurance and balance deficits.  Pt hopes to progress to return to previous living arrangement.    Follow Up Recommendations Home health PT    Equipment Recommendations  Rolling walker with 5" wheels    Recommendations for Other Services       Precautions / Restrictions Precautions Precautions: Fall Restrictions Weight Bearing Restrictions: No      Mobility  Bed Mobility Overal bed mobility: Needs Assistance Bed Mobility: Supine to Sit     Supine to sit: Supervision     General bed mobility comments: for lines  Transfers Overall transfer level: Needs assistance Equipment used: Rolling walker (2 wheeled) Transfers: Sit to/from Stand Sit to Stand: Min assist;+2 safety/equipment         General transfer comment: cues for hand placement  Ambulation/Gait Ambulation/Gait assistance: Min assist;+2 physical assistance;+2 safety/equipment Gait Distance (Feet): 8 Feet Assistive device: Rolling walker (2 wheeled) Gait Pattern/deviations: Step-to pattern;Decreased step length - right;Decreased step length - left;Shuffle;Trunk flexed Gait velocity: decr   General Gait Details: cues for posture and position from RW; distance ltd by fatigue  Stairs            Wheelchair Mobility    Modified Rankin (Stroke Patients Only)       Balance Overall balance assessment: Needs assistance Sitting-balance support: No upper extremity supported;Feet supported Sitting balance-Leahy Scale: Good     Standing balance support: Bilateral upper extremity supported Standing balance-Leahy Scale:  Poor                               Pertinent Vitals/Pain Pain Assessment: No/denies pain    Home Living Family/patient expects to be discharged to:: Private residence Living Arrangements: Non-relatives/Friends Available Help at Discharge: Friend(s);Available PRN/intermittently Type of Home: House Home Access: Stairs to enter   Entrance Stairs-Number of Steps: 2 Home Layout: One level Home Equipment: None Additional Comments: pt vague about dc plan referring to roomates and then friends    Prior Function Level of Independence: Independent with assistive device(s)               Hand Dominance        Extremity/Trunk Assessment   Upper Extremity Assessment Upper Extremity Assessment: Overall WFL for tasks assessed    Lower Extremity Assessment Lower Extremity Assessment: Overall WFL for tasks assessed    Cervical / Trunk Assessment Cervical / Trunk Assessment: Normal  Communication   Communication: No difficulties  Cognition Arousal/Alertness: Awake/alert Behavior During Therapy: WFL for tasks assessed/performed Overall Cognitive Status: No family/caregiver present to determine baseline cognitive functioning                                 General Comments: extra processing time, repeated cues to initiate and cues for safety when up walking--pt started backing up to get to chair when he was asked to walk forward and let us know when he needed to sit, vague historian      General Comments      Exercises  Assessment/Plan    PT Assessment Patient needs continued PT services  PT Problem List Decreased activity tolerance;Decreased balance;Decreased mobility;Decreased knowledge of use of DME       PT Treatment Interventions Gait training;DME instruction;Stair training;Functional mobility training;Therapeutic activities;Therapeutic exercise;Patient/family education    PT Goals (Current goals can be found in the Care Plan section)   Acute Rehab PT Goals Patient Stated Goal: agreeable to OOB PT Goal Formulation: With patient Time For Goal Achievement: 04/28/19 Potential to Achieve Goals: Good    Frequency Min 3X/week   Barriers to discharge Decreased caregiver support Pt unclear on level of assist available from roomates/friends    Co-evaluation PT/OT/SLP Co-Evaluation/Treatment: Yes Reason for Co-Treatment: For patient/therapist safety PT goals addressed during session: Mobility/safety with mobility OT goals addressed during session: ADL's and self-care       AM-PAC PT "6 Clicks" Mobility  Outcome Measure Help needed turning from your back to your side while in a flat bed without using bedrails?: None Help needed moving from lying on your back to sitting on the side of a flat bed without using bedrails?: A Little Help needed moving to and from a bed to a chair (including a wheelchair)?: A Little Help needed standing up from a chair using your arms (e.g., wheelchair or bedside chair)?: A Little Help needed to walk in hospital room?: A Little Help needed climbing 3-5 steps with a railing? : A Lot 6 Click Score: 18    End of Session Equipment Utilized During Treatment: Gait belt;Oxygen Activity Tolerance: Patient limited by fatigue Patient left: in chair;with call bell/phone within reach;with chair alarm set Nurse Communication: Mobility status PT Visit Diagnosis: Difficulty in walking, not elsewhere classified (R26.2)    Time: 1105-1130 PT Time Calculation (min) (ACUTE ONLY): 25 min   Charges:   PT Evaluation $PT Eval Low Complexity: [facility] PT Acute Rehabilitation Services Pager [phone] Office [phone]   Ormond Lazo 04/14/2019, 3:34 PM

## 2019-04-14 NOTE — Progress Notes (Signed)
eLink Physician-Brief Progress Note Patient Name: Omar Wagner DOB: [birth year] MRN: [id number]   Date of Service  04/14/2019  HPI/Events of Note  Camera: for a fib RVR. HR from 190 to < 100 now. MAP fine. Post cardizem 10 mg push.  Triage doc giving more orders.  eICU Interventions  - follow K/Mag level, was low. Replace if low.      Intervention Category Intermediate Interventions: Arrhythmia - evaluation and management  Elmer Sow 04/14/2019, 8:02 PM

## 2019-04-15 ENCOUNTER — Inpatient Hospital Stay (HOSPITAL_COMMUNITY): Payer: Medicare Other

## 2019-04-15 ENCOUNTER — Encounter (HOSPITAL_COMMUNITY): Payer: Self-pay | Admitting: Physician Assistant

## 2019-04-15 DIAGNOSIS — I4891 Unspecified atrial fibrillation: Secondary | ICD-10-CM | POA: Diagnosis not present

## 2019-04-15 DIAGNOSIS — J9601 Acute respiratory failure with hypoxia: Secondary | ICD-10-CM | POA: Diagnosis not present

## 2019-04-15 DIAGNOSIS — J9600 Acute respiratory failure, unspecified whether with hypoxia or hypercapnia: Secondary | ICD-10-CM

## 2019-04-15 DIAGNOSIS — J9819 Other pulmonary collapse: Secondary | ICD-10-CM

## 2019-04-15 DIAGNOSIS — I472 Ventricular tachycardia: Secondary | ICD-10-CM | POA: Diagnosis not present

## 2019-04-15 DIAGNOSIS — J189 Pneumonia, unspecified organism: Secondary | ICD-10-CM | POA: Diagnosis not present

## 2019-04-15 DIAGNOSIS — R918 Other nonspecific abnormal finding of lung field: Secondary | ICD-10-CM | POA: Diagnosis not present

## 2019-04-15 DIAGNOSIS — I471 Supraventricular tachycardia: Secondary | ICD-10-CM

## 2019-04-15 LAB — T4, FREE: Free T4: 0.83 ng/dL (ref 0.61–1.12)

## 2019-04-15 LAB — BASIC METABOLIC PANEL
Anion gap: 8 (ref 5–15)
BUN: 21 mg/dL (ref 8–23)
CO2: 28 mmol/L (ref 22–32)
Calcium: 9.5 mg/dL (ref 8.9–10.3)
Chloride: 109 mmol/L (ref 98–111)
Creatinine, Ser: 1.05 mg/dL (ref 0.61–1.24)
GFR calc Af Amer: >60 mL/min (ref >60)
GFR calc non Af Amer: >60 mL/min (ref >60)
Glucose, Bld: 84 mg/dL (ref 70–99)
Potassium: 3.3 mmol/L — ABNORMAL LOW (ref 3.5–5.1)
Sodium: 145 mmol/L (ref 135–145)

## 2019-04-15 LAB — BLOOD GAS, ARTERIAL
Acid-base deficit: 3.5 mmol/L — ABNORMAL HIGH (ref 0.0–2.0)
Acid-base deficit: 3.2 mmol/L — ABNORMAL HIGH (ref 0.0–2.0)
Acid-base deficit: 2.8 mmol/L — ABNORMAL HIGH (ref 0.0–2.0)
Bicarbonate: 29.2 mmol/L — ABNORMAL HIGH (ref 20.0–28.0)
Bicarbonate: 27.1 mmol/L (ref 20.0–28.0)
Bicarbonate: 24.6 mmol/L (ref 20.0–28.0)
FIO2: 80
FIO2: 80
O2 Saturation: 96.5 %
O2 Saturation: 98.2 %
O2 Saturation: 98 %
PEEP: 8 cmH2O
Patient temperature: 98.6
Patient temperature: 98.6
Patient temperature: 98.6
pCO2 arterial: 91 mmHg (ref 32.0–48.0)
pCO2 arterial: 73 mmHg (ref 32.0–48.0)
pCO2 arterial: 55.6 mmHg — ABNORMAL HIGH (ref 32.0–48.0)
pH, Arterial: 7.133 — CL (ref 7.350–7.450)
pH, Arterial: 7.195 — CL (ref 7.350–7.450)
pH, Arterial: 7.269 — ABNORMAL LOW (ref 7.350–7.450)
pO2, Arterial: 106 mmHg (ref 83.0–108.0)
pO2, Arterial: 136 mmHg — ABNORMAL HIGH (ref 83.0–108.0)
pO2, Arterial: 114 mmHg — ABNORMAL HIGH (ref 83.0–108.0)

## 2019-04-15 LAB — ECHOCARDIOGRAM COMPLETE
Height: 73 in
Weight: 2843.05 oz

## 2019-04-15 LAB — CALCIUM
Calcium: 9.6 mg/dL (ref 8.9–10.3)
Calcium: 9 mg/dL (ref 8.9–10.3)
Calcium: 9.2 mg/dL (ref 8.9–10.3)

## 2019-04-15 MED ORDER — AMIODARONE HCL IN DEXTROSE 360-4.14 MG/200ML-% IV SOLN
60.0000 mg/h | INTRAVENOUS | Status: DC
  Administered 2019-04-15: 60 mg/h via INTRAVENOUS
  Filled 2019-04-15: qty 200

## 2019-04-15 MED ORDER — FENTANYL 2500MCG IN NS 250ML (10MCG/ML) PREMIX INFUSION
0.0000 ug/h | INTRAVENOUS | Status: DC
  Administered 2019-04-15: 21:00:00 25 ug/h via INTRAVENOUS
  Administered 2019-04-17: 175 ug/h via INTRAVENOUS
  Administered 2019-04-17 – 2019-04-18 (×3): 200 ug/h via INTRAVENOUS
  Filled 2019-04-15 (×7): qty 250

## 2019-04-15 MED ORDER — FENTANYL CITRATE (PF) 100 MCG/2ML IJ SOLN
INTRAMUSCULAR | Status: AC
  Administered 2019-04-15: 100 ug
  Filled 2019-04-15: qty 2

## 2019-04-15 MED ORDER — MIDAZOLAM HCL 2 MG/2ML IJ SOLN
1.0000 mg | INTRAMUSCULAR | Status: DC | PRN
  Administered 2019-04-15 – 2019-04-19 (×2): 1 mg via INTRAVENOUS
  Filled 2019-04-15 (×3): qty 2

## 2019-04-15 MED ORDER — DILTIAZEM HCL-DEXTROSE 125-5 MG/125ML-% IV SOLN (PREMIX)
5.0000 mg/h | INTRAVENOUS | Status: DC
  Administered 2019-04-15: 08:00:00 5 mg/h via INTRAVENOUS
  Filled 2019-04-15 (×2): qty 125

## 2019-04-15 MED ORDER — MIDAZOLAM HCL 2 MG/2ML IJ SOLN
INTRAMUSCULAR | Status: AC
  Administered 2019-04-15: 2 mg
  Filled 2019-04-15: qty 2

## 2019-04-15 MED ORDER — IPRATROPIUM BROMIDE 0.02 % IN SOLN
0.5000 mg | Freq: Four times a day (QID) | RESPIRATORY_TRACT | Status: DC
  Administered 2019-04-15 – 2019-04-20 (×21): 0.5 mg via RESPIRATORY_TRACT
  Filled 2019-04-15 (×21): qty 2.5

## 2019-04-15 MED ORDER — FENTANYL CITRATE (PF) 100 MCG/2ML IJ SOLN
25.0000 ug | INTRAMUSCULAR | Status: DC | PRN
  Administered 2019-04-15 (×3): 100 ug via INTRAVENOUS
  Administered 2019-04-15: 17:00:00 50 ug via INTRAVENOUS
  Filled 2019-04-15 (×4): qty 2

## 2019-04-15 MED ORDER — POTASSIUM CHLORIDE CRYS ER 20 MEQ PO TBCR
40.0000 meq | EXTENDED_RELEASE_TABLET | ORAL | Status: DC
  Filled 2019-04-15: qty 2

## 2019-04-15 MED ORDER — MORPHINE SULFATE (PF) 2 MG/ML IV SOLN
0.5000 mg | INTRAVENOUS | Status: DC | PRN
  Administered 2019-04-15: 0.5 mg via INTRAVENOUS
  Filled 2019-04-15 (×2): qty 1

## 2019-04-15 MED ORDER — LEVALBUTEROL HCL 0.63 MG/3ML IN NEBU
INHALATION_SOLUTION | RESPIRATORY_TRACT | Status: AC
  Administered 2019-04-15: 0.63 mg via RESPIRATORY_TRACT
  Filled 2019-04-15: qty 3

## 2019-04-15 MED ORDER — AMIODARONE HCL IN DEXTROSE 360-4.14 MG/200ML-% IV SOLN
30.0000 mg/h | INTRAVENOUS | Status: DC
  Administered 2019-04-15 – 2019-04-21 (×9): 30 mg/h via INTRAVENOUS
  Filled 2019-04-15 (×11): qty 200

## 2019-04-15 MED ORDER — IPRATROPIUM BROMIDE 0.02 % IN SOLN
RESPIRATORY_TRACT | Status: AC
  Administered 2019-04-15: 0.5 mg via RESPIRATORY_TRACT
  Filled 2019-04-15: qty 2.5

## 2019-04-15 MED ORDER — FENTANYL CITRATE (PF) 100 MCG/2ML IJ SOLN
INTRAMUSCULAR | Status: AC
  Administered 2019-04-15: 50 ug
  Filled 2019-04-15: qty 2

## 2019-04-15 MED ORDER — METHYLPREDNISOLONE SODIUM SUCC 40 MG IJ SOLR
40.0000 mg | Freq: Two times a day (BID) | INTRAMUSCULAR | Status: DC
  Administered 2019-04-15 (×2): 40 mg via INTRAVENOUS
  Filled 2019-04-15 (×3): qty 1

## 2019-04-15 MED ORDER — HYDRALAZINE HCL 20 MG/ML IJ SOLN
5.0000 mg | INTRAMUSCULAR | Status: DC | PRN
  Administered 2019-04-15: 5 mg via INTRAVENOUS
  Filled 2019-04-15: qty 1

## 2019-04-15 MED ORDER — AMIODARONE IV BOLUS ONLY 150 MG/100ML
150.0000 mg | Freq: Once | INTRAVENOUS | Status: AC
  Administered 2019-04-15: 150 mg via INTRAVENOUS
  Filled 2019-04-15: qty 100

## 2019-04-15 MED ORDER — LEVALBUTEROL HCL 0.63 MG/3ML IN NEBU
0.6300 mg | INHALATION_SOLUTION | Freq: Four times a day (QID) | RESPIRATORY_TRACT | Status: DC
  Administered 2019-04-15 – 2019-04-20 (×22): 0.63 mg via RESPIRATORY_TRACT
  Filled 2019-04-15 (×21): qty 3

## 2019-04-15 MED ORDER — FENTANYL CITRATE (PF) 100 MCG/2ML IJ SOLN
25.0000 ug | INTRAMUSCULAR | Status: DC | PRN
  Administered 2019-04-15 (×2): 25 ug via INTRAVENOUS

## 2019-04-15 MED ORDER — POTASSIUM CHLORIDE 10 MEQ/100ML IV SOLN: 10.0000 meq | INTRAVENOUS | Status: DC

## 2019-04-15 MED ORDER — POTASSIUM CHLORIDE 10 MEQ/50ML IV SOLN
10.0000 meq | INTRAVENOUS | Status: DC
  Administered 2019-04-15: 10 meq via INTRAVENOUS
  Filled 2019-04-15 (×2): qty 50

## 2019-04-15 MED ORDER — METOPROLOL TARTRATE 5 MG/5ML IV SOLN
2.5000 mg | Freq: Once | INTRAVENOUS | Status: AC
  Administered 2019-04-15: 2.5 mg via INTRAVENOUS
  Filled 2019-04-15: qty 5

## 2019-04-15 MED ORDER — POTASSIUM CHLORIDE 10 MEQ/100ML IV SOLN
10.0000 meq | INTRAVENOUS | Status: AC
  Administered 2019-04-15 (×4): 10 meq via INTRAVENOUS
  Filled 2019-04-15 (×4): qty 100

## 2019-04-15 MED ORDER — MIDAZOLAM HCL 2 MG/2ML IJ SOLN
1.0000 mg | INTRAMUSCULAR | Status: AC | PRN
  Administered 2019-04-15 (×3): 1 mg via INTRAVENOUS
  Filled 2019-04-15 (×2): qty 2

## 2019-04-15 NOTE — Progress Notes (Signed)
Dr.  Radford Pax contacted d/t concerns for brady episodes, informed Dr. Radford Pax that brady episodes appear to to be SB, SR w/ PACs, per Dr. Radford Pax hold amiodarone for now. May turn on if pt has tachy episodes.

## 2019-04-15 NOTE — Progress Notes (Signed)
eLink Physician-Brief Progress Note Patient Name: Omar Wagner DOB: July 15, 1955 MRN: [id number]   Date of Service  04/15/2019  HPI/Events of Note  Episodes of bradycardia - Currently on an Amiodarone IV infusion for episode of SVT vs rapid AFIB last evening. HR currently = 73. BP = 147/71.   eICU Interventions  Will order: 1. BMP and Mg++ level now.  2. Defer decision to continue Amiodarone IV infusion to cardiology consultant.      Intervention Category Major Interventions: Arrhythmia - evaluation and management  Alleen Kehm Eugene 04/15/2019, 10:02 PM

## 2019-04-15 NOTE — Progress Notes (Signed)
Intubation procedure, 100 fentanyl and 2 versed given. 20 etomidate given. O2 sats remained above 99% and heartrate below 120 through procedure.

## 2019-04-15 NOTE — Procedures (Signed)
Intubation Procedure Note HENDRICKS SCHWANDT [id number] Mar 17, 1956  Procedure: Intubation Indications: Respiratory insufficiency  Procedure Details Consent: Risks of procedure as well as the alternatives and risks of each were explained to the (patient/caregiver).  Consent for procedure obtained. Time Out: Verified patient identification, verified procedure, site/side was marked, verified correct patient position, special equipment/implants available, medications/allergies/relevent history reviewed, required imaging and test results available.  Performed  Maximum sterile technique was used including gloves, hand hygiene and mask.  MAC 3  Fentanyl 8mg, Versed 1 mg, Etomidate 235m Grade I view. Size 8.0 ETT visualized passing between cords.   Evaluation Hemodynamic Status: BP stable throughout; O2 sats: stable throughout [name] Current Condition: stable Complications: No apparent complications Patient did tolerate procedure well. Chest X-ray ordered to verify placement.  CXR: pending.   Rane Blitch JaRodman Pickle1/29/2020

## 2019-04-15 NOTE — Progress Notes (Signed)
hyperteeLink Physician-Brief Progress Note Patient Name: Omar Wagner DOB: Mar 04, 1956 MRN: [id number]   Date of Service  04/15/2019  HPI/Events of Note  SBP > 180, Cr ok. Gets brady.  eICU Interventions  Hydralazine prn ordered     Intervention Category Intermediate Interventions: Hypertension - evaluation and management  Elmer Sow 04/15/2019, 5:41 AM

## 2019-04-15 NOTE — Progress Notes (Signed)
  Echocardiogram 2D Echocardiogram has been performed.  Omar Wagner F 04/15/2019, 1:44 PM

## 2019-04-15 NOTE — Progress Notes (Signed)
OT Cancellation Note  Patient Details Name: Omar Wagner MRN: [id number] DOB: 06/23/1955   Cancelled Treatment:    Reason Eval/Treat Not Completed: Medical issues which prohibited therapy   Pt is tachy and now on ventimask  Lawrie Tunks 04/15/2019, 10:53 AM  Lesle Chris, OTR/L Acute Rehabilitation Services [phone] [facility] pager [phone] office 04/15/2019

## 2019-04-15 NOTE — Progress Notes (Signed)
eLink Physician-Brief Progress Note Patient Name: Omar Wagner DOB: 05-22-1955 MRN: [id number]   Date of Service  04/15/2019  HPI/Events of Note  Episode of brady, getting K replacement for low K. S/p Lung bx.  CP across chest. No st changes on tele monitor.   Camera: sats 93%. HR 90. Sinus. MAP ok.  No diaphoresis. Got morphine IV once.  Resting.   eICU Interventions  - getting 1/4 K rider - watch for now - morphine IV prn for pain.  - asp precautions.      Intervention Category Intermediate Interventions: Pain - evaluation and management  Elmer Sow 04/15/2019, 12:39 AM

## 2019-04-15 NOTE — Progress Notes (Addendum)
PROGRESS NOTE    Omar Wagner   QMG:[id number]  DOB: 1955/10/27  DOA: 04/16/2019 PCP: Marliss Coots, NP   Brief Narrative:  Omar Wagner 63 year old male with severe COPD, active tobacco use, chronic lymphedema, and known left lung mass who had a PET scan done 1 month ago showing enlarging posterior lung mass with invasion into the ribs and thoracic spine.  He was scheduled for CT biopsy of the mass on 10/29 but this got canceled as patient felt like having a COPD exacerbation. He now presents to the ED with progressive shortness of breath and cough.  In the ED he was hypoxemic in the 80s requiring 4 L via nasal cannula and tachypneic   He had a CT of the chest with contrast done showing left lung mass with obstruction and complete opacification of the left hemithorax suspected secondary to postobstructive pneumonia with lung collapse.  Also showed metastatic spread to the liver. Placed on empiric antibiotics and admitted to stepdown unit.  COVID-19 was tested negative.   Subjective: The patient was minimally verbal this morning and notably short of breath   Assessment & Plan:   Principal Problem:   Acute respiratory failure- hypoxic and hypercapnic  Lung mass causing obstruction with suspected post obstructive pneumonia -When I evaluated the patient today his respirations were in the 40s and he was on a nonrebreather mask using intercoastal and abdominal muscles to help breath -ABG revealed that he had a pH of 7.133 and a PCO2 of 91 with an O2 saturation of 96.5-I spoke with pulmonary critical care and we decided that it would be best to place him on a BiPAP and continue to follow to see if he would need intubation -I repeated an ABG this afternoon which shows that his pH has improved slightly to 7.95 and PCO2 is now 73- on physical exam, he is lethargic. PCCM has decided to intubate him and thus the patient will be transitioned over to their service today.  Active Problems:  SVT/tachy- brady syndrome -He has been having episodic SVT during his hospital stay being managed with as needed Cardizem-he is now having more pronounced, frequent episodes and was started on a Cardizem infusion this morning by PCCM -The patient was unable to take his oral metoprolol this morning due to respiratory distress and therefore I ordered 2.5 mg of IV metoprolol -I consulted cardiology and Dr. Haroldine Laws ordered an amiodarone infusion however, before this could be given his heart rate slowed down into the 40s -She was subsequently evaluated by Dr. Oval Linsey and she has decided to start an amiodarone infusion since he continues to have episodes of SVT    COPD GOLD III  -The patient was started on Solu-Medrol and weaned down to prednisone however with his increasing respiratory distress today, I have resumed low-dose Solu-Medrol -Continue nebulizer treatments     Hypercalcemia - thought to be due to malignancy - on IVF and given Zoledronic acid on admission - improving  Hypokalemia - replacing  Time spent in minutes: 50 min- care discussed with PCCM and Cardiology on multiple occasions DVT prophylaxis: SCDs Code Status: Full code Consultants:   PCCM  Cardiology  Radiation oncology  IR Procedures:   CT guided biopsy on left lung mass Antimicrobials:  Anti-infectives (From admission, onward)   Start     Dose/Rate Route Frequency Ordered Stop   04/05/2019 2200  piperacillin-tazobactam (ZOSYN) IVPB 3.375 g     3.375 g 12.5 mL/hr over 240 Minutes Intravenous Every 8  hours 03/30/2019 2033     04/02/2019 1900  vancomycin (VANCOCIN) 1,750 mg in sodium chloride 0.9 % 500 mL IVPB     1,750 mg 250 mL/hr over 120 Minutes Intravenous  Once 04/14/2019 1830 03/29/2019 2200   04/05/2019 1830  ceFEPIme (MAXIPIME) 1 g in sodium chloride 0.9 % 100 mL IVPB  Status:  Discontinued     1 g 200 mL/hr over 30 Minutes Intravenous  Once 04/15/2019 1828 03/19/2019 1829   04/09/2019 1830  ceFEPIme (MAXIPIME) 2  g in sodium chloride 0.9 % 100 mL IVPB     2 g 200 mL/hr over 30 Minutes Intravenous  Once 03/30/2019 1829 04/13/2019 1940   04/07/2019 1800  cefTRIAXone (ROCEPHIN) 1 g in sodium chloride 0.9 % 100 mL IVPB  Status:  Discontinued     1 g 200 mL/hr over 30 Minutes Intravenous  Once 03/26/2019 1748 03/30/2019 1842   03/25/2019 1800  azithromycin (ZITHROMAX) 500 mg in sodium chloride 0.9 % 250 mL IVPB  Status:  Discontinued     500 mg 250 mL/hr over 60 Minutes Intravenous  Once 04/08/2019 1748 03/28/2019 1940       Objective: Vitals:   04/15/19 0556 04/15/19 0600 04/15/19 0634 04/15/19 0636  BP: (!) 190/119 (!) 176/118    Pulse:  (!) 37 (!) 51 (!) 53  Resp:  (!) 23 (!) 21 (!) 24  Temp:      TempSrc:      SpO2:  92% 92% 90%  Weight:      Height:        Intake/Output Summary (Last 24 hours) at 04/15/2019 0717 Last data filed at 04/15/2019 3154 Gross per 24 hour  Intake 3373.85 ml  Output 1350 ml  Net 2023.85 ml   Filed Weights   04/11/2019 2130 04/13/19 0540  Weight: 81.9 kg 80.6 kg    Examination: General exam: Appears uncomfortable- short of breath  HEENT: PERRLA, oral mucosa moist, no sclera icterus or thrush Respiratory system: poor breath sounds, RR in 40s, rhonchi Cardiovascular system: S1 & S2 heard, RRR.   Gastrointestinal system: Abdomen soft, non-tender, nondistended. Normal bowel sounds. Central nervous system: Alert and oriented. No focal neurological deficits. Extremities: No cyanosis, clubbing or edema Skin: No rashes or ulcers Psychiatry:  Mood & affect appropriate.   Data Reviewed: I have personally reviewed following labs and imaging studies  CBC: Recent Labs  Lab 04/11/2019 1441 04/13/19 0129 04/14/19 0023  WBC 8.1 7.0 13.3*  NEUTROABS 6.8  --   --   HGB 14.7 15.7 14.3  HCT 45.6 47.8 44.4  MCV 111.5* 111.9* 114.4*  PLT 153 146* 008   Basic Metabolic Panel: Recent Labs  Lab 03/30/2019 1350 04/13/19 0129  04/13/19 1558 04/14/19 0023 04/14/19 0744 04/14/19  1632 04/14/19 2024 04/15/19 0325  NA 143 140  --   --  142  --   --   --  145  K 3.7 4.4  --   --  3.4*  --   --  2.9* 3.3*  CL 104 104  --   --  104  --   --   --  109  CO2 29 24  --   --  27  --   --   --  28  GLUCOSE 104* 167*  --   --  99  --   --   --  84  BUN 20 18  --   --  18  --   --   --  21  CREATININE 1.02 1.07  --   --  1.13  --   --   --  1.05  CALCIUM 14.1* 12.3*   < > 11.2* 10.4* 10.4* 9.9  --  9.5  9.6  MG  --  1.7  --   --   --  1.5*  --  2.5*  --   PHOS  --  2.7  --   --   --   --   --   --   --    < > = values in this interval not displayed.   GFR: Estimated Creatinine Clearance: 79.3 mL/min (by C-G formula based on SCr of 1.05 mg/dL). Liver Function Tests: Recent Labs  Lab 03/31/2019 1350 04/13/19 0129  AST 35 43*  ALT 16 13  ALKPHOS 81 68  BILITOT 1.1 1.5*  PROT 7.5 6.9  ALBUMIN 3.6 3.2*   No results for input(s): LIPASE, AMYLASE in the last 168 hours. No results for input(s): AMMONIA in the last 168 hours. Coagulation Profile: Recent Labs  Lab 04/13/19 0957  INR 1.1   Cardiac Enzymes: No results for input(s): CKTOTAL, CKMB, CKMBINDEX, TROPONINI in the last 168 hours. BNP (last 3 results) No results for input(s): PROBNP in the last 8760 hours. HbA1C: No results for input(s): HGBA1C in the last 72 hours. CBG: Recent Labs  Lab 04/13/19 1705  GLUCAP 128*   Lipid Profile: No results for input(s): CHOL, HDL, LDLCALC, TRIG, CHOLHDL, LDLDIRECT in the last 72 hours. Thyroid Function Tests: Recent Labs    04/13/19 0129  TSH 0.179*   Anemia Panel: No results for input(s): VITAMINB12, FOLATE, FERRITIN, TIBC, IRON, RETICCTPCT in the last 72 hours. Urine analysis:    Component Value Date/Time   COLORURINE YELLOW 12/16/2014 2132   APPEARANCEUR CLEAR 12/16/2014 2132   LABSPEC 1.004 (L) 12/16/2014 2132   PHURINE 6.0 12/16/2014 2132   GLUCOSEU NEGATIVE 12/16/2014 2132   HGBUR LARGE (A) 12/16/2014 2132   BILIRUBINUR NEGATIVE 12/16/2014 2132    KETONESUR NEGATIVE 12/16/2014 2132   PROTEINUR NEGATIVE 12/16/2014 2132   UROBILINOGEN 1.0 12/16/2014 2132   NITRITE NEGATIVE 12/16/2014 2132   LEUKOCYTESUR NEGATIVE 12/16/2014 2132   Sepsis Labs: @LABRCNTIP (procalcitonin:4,lacticidven:4) ) Recent Results (from the past 240 hour(s))  Blood culture (routine x 2)     Status: None (Preliminary result)   Collection Time: 04/05/2019  2:17 PM   Specimen: BLOOD  Result Value Ref Range Status   Specimen Description   Final    BLOOD LEFT ANTECUBITAL Performed at [facility], [place] [address] [place] [address], [place], [place] [address]    Special Requests   Final    BOTTLES DRAWN AEROBIC AND ANAEROBIC Blood Culture adequate volume Performed at [facility] [address], [place], [place] [address]    Culture   Final    NO GROWTH 2 DAYS Performed at [facility] [address] [place], [place] [address]    Report Status PENDING  Incomplete  SARS CORONAVIRUS 2 (TAT 6-24 HRS) Nasopharyngeal Nasopharyngeal Swab     Status: None   Collection Time: 03/21/2019  3:37 PM   Specimen: Nasopharyngeal Swab  Result Value Ref Range Status   SARS Coronavirus 2 NEGATIVE NEGATIVE Final    Comment: (NOTE) SARS-CoV-2 target nucleic acids are NOT DETECTED. The SARS-CoV-2 RNA is generally detectable in upper and lower respiratory specimens during the acute phase of infection. Negative results do not preclude SARS-CoV-2 infection, do not rule out co-infections with other pathogens,  and should not be used as the sole basis for treatment or other patient management decisions. Negative results must be combined with clinical observations, patient history, and epidemiological information. The expected result is Negative. Fact Sheet for Patients: SugarRoll.be Fact Sheet for Healthcare Providers: https://www.woods-mathews.com/ This test is not yet approved or cleared by the [place] FDA and  has been  authorized for detection and/or diagnosis of SARS-CoV-2 by FDA under an Emergency Use Authorization (EUA). This EUA will remain  in effect (meaning this test can be used) for the duration of the COVID-19 declaration under Section 56 4(b)(1) of the Act, 21 U.S.C. section 360bbb-3(b)(1), unless the authorization is terminated or revoked sooner. Performed at [facility], [place] [address] [place], [place] [address]   MRSA PCR Screening     Status: None   Collection Time: 04/08/2019  9:30 PM   Specimen: Nasal Mucosa; Nasopharyngeal  Result Value Ref Range Status   MRSA by PCR NEGATIVE NEGATIVE Final    Comment:        The GeneXpert MRSA Assay (FDA approved for NASAL specimens only), is one component of a comprehensive MRSA colonization surveillance program. It is not intended to diagnose MRSA infection nor to guide or monitor treatment for MRSA infections. Performed at [facility], [place] [address], [place], [place] [address]   Blood culture (routine x 2)     Status: None (Preliminary result)   Collection Time: 03/21/2019  9:37 PM   Specimen: BLOOD  Result Value Ref Range Status   Specimen Description   Final    BLOOD LEFT WRIST Performed at [facility] [address] [place], [place] [address]    Special Requests   Final    BOTTLES DRAWN AEROBIC AND ANAEROBIC Blood Culture adequate volume Performed at [facility] [address], [place], [place] [address]    Culture   Final    NO GROWTH 2 DAYS Performed at [place] [address], [place], [place] [address]    Report Status PENDING  Incomplete         Radiology Studies: Ct Biopsy  Result Date: 05-09-19 INDICATION: Concern for metastatic lung cancer. Please perform CT-guided biopsy of enlarging hypermetabolic soft tissue between the posterior aspect of left sixth and seventh ribs for tissue diagnostic purposes. EXAM: CT-GUIDED BIOPSY OF  HYPERMETABOLIC SOFT TISSUE BETWEEN THE POSTERIOR ASPECTS OF THE LEFT SIXTH AND SEVENTH RIBS. COMPARISON:  Chest CT-04/11/2019; 01/08/2019; PET-CT-03/01/2019 MEDICATIONS: None. ANESTHESIA/SEDATION: Fentanyl 100 mcg IV; Versed 2 mg IV Sedation time: 14 minutes; The patient was continuously monitored during the procedure by the interventional radiology nurse under my direct supervision. CONTRAST:  None. COMPLICATIONS: None immediate. PROCEDURE: Informed consent was obtained from the patient following an explanation of the procedure, risks, benefits and alternatives. A time out was performed prior to the initiation of the procedure. The patient was positioned prone on the CT table and a limited CT was performed for procedural planning demonstrating unchanged size and appearance of the ill-defined infiltrative soft tissue involving the left posterior chest wall between the left sixth and seventh ribs with dominant soft tissue nodule measuring approximately 2.3 x 1.6 cm (image 17, series 2), previously found to be hypermetabolic on preceding PET-CT. The procedure was planned. The operative site was prepped and draped in the usual sterile fashion. Appropriate trajectory was confirmed with a 22 gauge spinal needle after the adjacent tissues were anesthetized with 1% Lidocaine with epinephrine. Under intermittent CT  guidance, a 17 gauge coaxial needle was advanced into the peripheral aspect of the mass. Appropriate positioning was confirmed and 5 core needle biopsy samples were obtained with an 18 gauge core needle biopsy device. The co-axial needle was removed and hemostasis was achieved with manual compression. A limited postprocedural CT was negative for pneumothorax, hemorrhage or additional complication. A dressing was placed. The patient tolerated the procedure well without immediate postprocedural complication. IMPRESSION: Technically successful CT guided core needle biopsy of infiltrative hypermetabolic soft tissue  nodule between the posterior aspects of the left sixth and seventh ribs. Electronically Signed   By: Sandi Mariscal M.D.   On: 04/13/2019 13:53   Dg Chest Port 1 View  Result Date: 04/14/2019 CLINICAL DATA:  Shortness of breath EXAM: PORTABLE CHEST 1 VIEW COMPARISON:  Chest radiograph 03/30/2019 FINDINGS: Monitoring leads overlie the patient. Interval worsening complete opacification left hemithorax. Minimal right basilar atelectasis. Thoracic spine degenerative changes. IMPRESSION: Interval worsening of opacification of the left hemithorax most compatible with known malignancy and associated postobstructive atelectasis. Electronically Signed   By: Lovey Newcomer M.D.   On: 04/14/2019 17:39      Scheduled Meds: . Chlorhexidine Gluconate Cloth  6 each Topical Daily  . ipratropium-albuterol  3 mL Nebulization Q6H  . mouth rinse  15 mL Mouth Rinse BID  . metoprolol tartrate  25 mg Oral BID  . mometasone-formoterol  2 puff Inhalation BID  . montelukast  10 mg Oral Daily  . morphine      . predniSONE  40 mg Oral Q breakfast  . sodium chloride flush  10-40 mL Intracatheter Q12H  . sodium chloride flush  3 mL Intravenous Q12H   Continuous Infusions: . sodium chloride Stopped (04/15/19 0637)  . piperacillin-tazobactam (ZOSYN)  IV 12.5 mL/hr at 04/15/19 0637  . potassium chloride 10 mEq (04/15/19 3762)     LOS: 3 days      Debbe Odea, MD [facility] Pager: [email or web address] Password [email or web address] 04/15/2019, 7:17 AM

## 2019-04-15 NOTE — Progress Notes (Signed)
PCCM Interval Note  Patient became obtunded secondary to acute hypercarbic respiratory failure. Remained hypercarbic despite BiPAP. Friend and sister was contacted per previous noted. Patient intubated without issue. Diagnostic bronchoscopy was significant for endobronchial tumor burden in LUL and extrinsic compression of airways in LLL. No biopsy taken. TRH updated Rad Onc who will consider possible radiation tomorrow with or without pathology diagnosis.  PCCM will take over as primary service.  Rodman Pickle, M.D. [facility] Pulmonary/Critical Care Medicine 04/15/2019 3:20 PM

## 2019-04-15 NOTE — Consult Note (Signed)
NAME:  Omar Wagner, MRN:  [id number], DOB:  [birth year], LOS: 3 ADMISSION DATE:  03/18/2019, CONSULTATION DATE:  04/13/19 REFERRING MD: Louellen Molder, MD CHIEF COMPLAINT:  Lung mass with mets  Brief History   63 year old male with very severe COPD (FEV1 33%) who presents with acute hypoxemic respiratory failure secondary to post-obstructive pneumonia in setting of LUL compression/collapse from left lung mass  History of present illness   Omar Wagner is a 63 year old male active smoker with very severe COPD (FEV1 33%) and known lung mass who presents for COPD exacerbation secondary to postobstructive pneumonia.  He reports that for the last 2 weeks he has not had any maintenance inhalers due to cost.  He was previously on Trelegy after running out, he developed worsening shortness of breath, productive cough with yellow sputum wheezing.  He is unable to walk to the bathroom without needing to stop and rest.  He reports needing his albuterol inhaler more than 10 times a day for symptoms.  Also endorses fevers and chills in the last 2 days.  His dyspnea has worsened in the last week and has shortness of breath at rest.  Conservative management review of imaging he has had worsening, since 01/08/2019 patient has had known posterior lung or upper lobe mass concerning for bronchogenic neoplasm. PET scan on 03/01/19 demonstrated enlarging and intensely hypermetabolic posterior lung mass measuring 8.5 x4.8 cm with direct invasion between the lateral left 5th and 6th ribs and direct paraspinal invasion at the T4 level (previously measured 3.9 x 5.9 cm mass abutting the left posterior chest without invasion). His pulmonologist Dr. Melvyn Novas had planned for IR biopsy for tissue diagnosis however this has not yet been done.  In the ED, he was hypoxemic to 87% and required 3-4L O2 with improvement to 92%. He was started on antibiotics, scheduled bronchodilators and steroids and admitted to [facility] for COPD  exacerbation. PCCM was consulted regarding lung mass.  Past Medical History  Very severe COPD (FEV1 33%) in 2018 Depression Hx basal cell [facility] Events   11/26 Admitted 11/27 CT guided biopsy of soft tissue nodule in the left posterior chest wall  Consults:  TRH IR PCCM Rad/Onc  Procedures:    Significant Diagnostic Tests:  PET 03/01/19 1. 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 2. Hypermetabolic ipsilateral mediastinal lymph node metastases in the AP window and left paraesophageal chains. 3. Hypermetabolic distant nodal metastasis in the left external iliac chain. 4. Hypermetabolic posterior left chest wall metastasis between left sixth and seventh ribs. 5. Hypermetabolic osseous metastases to the left sixth rib. 6. PET-CT stage IVB (T4 N2 M1c). 7. New 5 mm right upper lobe pulmonary nodule and slight growth of 4 mm right middle lobe pulmonary nodule, below PET resolution, cannot exclude pulmonary metastases. 8. Aortic Atherosclerosis (ICD10-I70.0) and Emphysema (ICD10-J43.9).  CT Chest W Contrast 04/08/2019 1. New near complete collapse of the left upper lobe felt to be secondary to progression of the patient's known left upper lobe lung mass, compressing the left upper lobe bronchus. 2. Findings consistent with significant interval progression of disease as evidenced by new pathologically enlarged mediastinal lymph nodes as well as multiple new  low-attenuation masses in the liver concerning for hepatic metastatic disease. In addition, there are new pulmonary opacities in the right upper and  right lower lobes raising concern for additional foci of metastatic disease. 3. Consolidation involving the left lung apex concerning for postobstructive pneumonia. 4. Moderate amount of debris within the trachea and bronchi bilaterally. 5. Significant narrowing of the left lower lobe bronchus. There is consolidation involving the left lower lobe with a small left-sided pleural effusion. 6. New osseous involvement involving the posterior fifth and sixth ribs on the left.  Micro Data:  BCx 03/23/2019 Sputum Cx 04/13/19  Antimicrobials:  Vanc 11/26> Cefepime 11/26> Ceftriaxone 11/26> Azithro 11/26>  Interim history/subjective:  Overnight, patient with episodes of paroxysmal AFRVR and SVT on telemetry from 150s to 200s that lasts for 1-2 minutes before spontaneously resolving. Patient hypertensive and wearing facemask. PCCM called to bedside by nursing.  Objective   Blood pressure (!) 168/89, pulse (!) 51, temperature 97.8 F (36.6 C), temperature source Oral, resp. rate (!) 38, height 6\' 1"  (1.854 m), weight 80.6 kg, SpO2 90 %.    FiO2 (%):  [40 %-55 %] 55 %   Intake/Output Summary (Last 24 hours) at 04/15/2019 0740 Last data filed at 04/15/2019 0700 Gross per 24 hour  Intake 3397.33 ml  Output 1350 ml  Net 2047.33 ml   Filed Weights   03/29/2019 2130 04/13/19 0540  Weight: 81.9 kg 80.6 kg   Physical Exam: General: Chronically ill-appearing, mild distress HENT: [place], AT, OP clear, MMM Eyes: EOMI, no scleral icterus Respiratory: Diminished breath sounds bilaterally with absent LUL breath sounds. No crackles, wheezing or rales Cardiovascular: Irregularly irregular rate and rhythm -M/R/G, no JVD GI: BS+, soft, nontender Extremities: Lower extremity lymphedema, flaky, sloughing skin. Neuro: AAO x4, CNII-XII grossly intact Skin: Intact, no rashes or bruising Psych: Anxious mood, normal affect  Resolved Hospital Problem list   N/A  Assessment & Plan:  63 year old male with  very severe COPD (FEV1 33%) admitted for COPD exacerbation secondary to postobstructive pneumonia. Recent PET and CT imaging reviewed personally and interpreted by me which demonstrates progressive left upper lobe lung mass, now with chest wall invasion and left upper lobe collapse and suspected extrinsic compression of the airways.  Also concern for interval development of hepatic metastatic lesions. S/p CT-guided biopsy of soft tissue nodule in the left posterior chest wall on 04/13/19.   On 11/29, patient developed AFRVR. At baseline for this hospitalization he has been bradycardic.    AFRVR, previously bradycardic Start diltiazem gtt Echocardiogram Recommend Cardiology consult Goal K and Mg, 4 and 2 respectively  Left upper lobe lung mass with collapse extrinsic compression of airways Await pathology results Rad Onc planning for palliative radiation once diagnosis is confirmed Chest PT/Flutter valve  COPD exacerbation Acute hypoxemic respiratory failure Postobstructive pneumonia Wean supplemental oxygen for goal SpO2 88-92%. Prior to discharge please perform ambulatory O2 to determine home O2 needs. Recommend community-acquired pneumonia coverage x 7 days Follow-up sputum culture Prednisone x5 days Continue Dulera to 200-5 mcg 2 puff BID Duonebs q6h Aggressive pulmonary toilet with bronchodilators, CPT and flutter valve  Best practice:  Diet: Per primary team Pain/Anxiety/Delirium protocol (if indicated): N/A VAP protocol (if indicated): N/A DVT prophylaxis: Hold anticoagulation in setting of procedure GI prophylaxis: Per primary Glucose control: Per primary Mobility: Mobility as tolerate Code Status: Full Family Communication: Updated patient on 11/27 Disposition: Per Primary Team  Labs   CBC: Recent Labs  Lab 03/20/2019 1441 04/13/19 0129 04/14/19 0023  WBC  8.1 7.0 13.3*  NEUTROABS 6.8  --   --   HGB 14.7 15.7 14.3  HCT 45.6 47.8 44.4  MCV 111.5* 111.9* 114.4*   PLT 153 146* 009    Basic Metabolic Panel: Recent Labs  Lab 04/03/2019 1350 04/13/19 0129  04/13/19 1558 04/14/19 0023 04/14/19 0744 04/14/19 1632 04/14/19 2024 04/15/19 0325  NA 143 140  --   --  142  --   --   --  145  K 3.7 4.4  --   --  3.4*  --   --  2.9* 3.3*  CL 104 104  --   --  104  --   --   --  109  CO2 29 24  --   --  27  --   --   --  28  GLUCOSE 104* 167*  --   --  99  --   --   --  84  BUN 20 18  --   --  18  --   --   --  21  CREATININE 1.02 1.07  --   --  1.13  --   --   --  1.05  CALCIUM 14.1* 12.3*   < > 11.2* 10.4* 10.4* 9.9  --  9.5  9.6  MG  --  1.7  --   --   --  1.5*  --  2.5*  --   PHOS  --  2.7  --   --   --   --   --   --   --    < > = values in this interval not displayed.   GFR: Estimated Creatinine Clearance: 79.3 mL/min (by C-G formula based on SCr of 1.05 mg/dL). Recent Labs  Lab 04/03/2019 1350 03/24/2019 1441 04/13/19 0129 04/14/19 0023  WBC  --  8.1 7.0 13.3*  LATICACIDVEN 2.2*  --  2.9*  --     Liver Function Tests: Recent Labs  Lab 04/03/2019 1350 04/13/19 0129  AST 35 43*  ALT 16 13  ALKPHOS 81 68  BILITOT 1.1 1.5*  PROT 7.5 6.9  ALBUMIN 3.6 3.2*   No results for input(s): LIPASE, AMYLASE in the last 168 hours. No results for input(s): AMMONIA in the last 168 hours.  ABG    Component Value Date/Time   HCO3 31.9 (H) 03/20/2019 1517   TCO2 26.1 01/11/2013 0610   O2SAT 69.2 03/27/2019 1517     Coagulation Profile: Recent Labs  Lab 04/13/19 0957  INR 1.1    Cardiac Enzymes: No results for input(s): CKTOTAL, CKMB, CKMBINDEX, TROPONINI in the last 168 hours.  HbA1C: No results found for: HGBA1C  CBG: Recent Labs  Lab 04/13/19 1705  GLUCAP 128*     The patient is critically ill with multiple organ systems failure and requires high complexity decision making for assessment and support, frequent evaluation and titration of therapies, application of advanced monitoring technologies and extensive interpretation of  multiple databases.   Critical Care Time devoted to patient care services described in this note is 36 Minutes.  Rodman Pickle, M.D. [facility] Pulmonary/Critical Care Medicine 04/15/2019 7:40 AM   Please see Amion for pager number to reach on-call Pulmonary and Critical Care Team.

## 2019-04-15 NOTE — Progress Notes (Signed)
PCCM Interval Note  I spoke with Stormy Card, [phone], his friend who he has designated as Economist, to update her on his critical status and mental status change. [name] does not have any formal paperwork but she has been a long time friend of the patient and has recently discussed his health together. Patient does have a sister, Homero Fellers, who lives in [place]. She is ok with friend being involved with decisions.   Both parties understand that patient will be intubated for respiratory failure. Will contact [name] for decisions per patient's wishes. If sister wishes to revoke this, she can update the team at any time. For now, she is ok with [name] being involved and making decisions on patient's behalf.  Rodman Pickle, M.D. [facility] Pulmonary/Critical Care Medicine 04/15/2019 2:31 PM

## 2019-04-15 NOTE — Progress Notes (Signed)
PT Cancellation Note  Patient Details Name: Omar Wagner MRN: [id number] DOB: [birth year]   Cancelled Treatment:     PT deferred this date 2* decline in pt condition - BP 191/103 and pt on non-rebreather @15L /min.   Will follow   Maris Abascal 04/15/2019, 8:28 AM

## 2019-04-15 NOTE — Procedures (Signed)
Bronchoscopy Procedure Note DERIUS GHOSH [id number] 1955-06-07  Procedure: Bronchoscopy Indications: Diagnostic evaluation of the airways  Procedure Details Consent: Unable to obtain consent because of emergent medical necessity. Time Out: Verified patient identification, verified procedure, site/side was marked, verified correct patient position, special equipment/implants available, medications/allergies/relevent history reviewed, required imaging and test results available.  Performed  In preparation for procedure, patient was given 100% FiO2 and bronchoscope lubricated. Sedation: Etomidate, Fentanyl and Versed  Airway entered and the following bronchi were examined: RUL, RML and RLL.   Left side of the lung visualized:  Top: LUL occlusion with tumor Bottom: LLL with extrinsic compression but patent  Procedures performed: None  Evaluation Hemodynamic Status: BP stable throughout; O2 sats: stable throughout [name] Current Condition: stable Specimens:  None Complications: No apparent complications Patient did tolerate procedure well.  Awesome Jared Rodman Pickle 04/15/2019

## 2019-04-15 NOTE — Progress Notes (Signed)
eLink Physician-Brief Progress Note Patient Name: Omar Wagner DOB: 03/27/1956 MRN: [id number]   Date of Service  04/15/2019  HPI/Events of Note  Agitated/Pain - Request for Fentanyl IV infusion.   eICU Interventions  Will order: 1. Low dose Fentanyl IV infusion (0-200 mcg/hour). Titrate to RASS = 0.      Intervention Category Major Interventions: Delirium, psychosis, severe agitation - evaluation and management  [name] 04/15/2019, 8:28 PM

## 2019-04-15 NOTE — Consult Note (Addendum)
Cardiology Consultation:   Patient ID: Omar Wagner; [id number]; [birth year]   Admit date: 04/06/2019 Date of Consult: 04/15/2019  Primary Care Provider: Marliss Coots, NP Primary Cardiologist: New to [place] Primary Electrophysiologist:  None  Chief Complaint: shortness of breath  Patient Profile:   Omar Wagner is a 63 y.o. male with a hx of severe COPD, longstanding bilateral lymphedema, chronic venous insufficiency (was pending OP wound care for ulcerations), paroxysmal SVT, depression, prior h/o IV heroin use, tobacco abuse who is being seen today for the evaluation of tachycardia and bradycardia at the request of Dr. Loanne Drilling.   History of Present Illness:   Per chart review, patient has h/o PSVT during admission in 2016 for which metoprolol was started. 2D echo at that time showed EF 55-60%, mildly dilated RV, mild RAE, normal diastolic function. I do not see he has required outpatient cardiology management.  Since 01/08/2019 patient has had known lung mass concerning for bronchogenic neoplasm. PET scan on 03/01/19 demonstrated enlarging and intensely hypermetabolic posterior lung mass measuring 8.5 x4.8 cm with direct invasion between the lateral left 5th and 6th ribs and direct paraspinal invasion at the T4 level (previously measured 3.9 x 5.9 cm mass abutting the left posterior chest without invasion). His pulmonologist Dr. Melvyn Novas had planned for IR biopsy for tissue diagnosis but this had not been completed yet. He presented to the hospital 04/08/2019 with worsening SOB, cough, wheezing, chest tightness and acute hypoxemic respiratory failure with hypoxia. He was found to have postobstructive PNA related to lung mass and hypercalcemia felt related to malignancy. CT chest showed new near complete collapse of the left upper lobe felt to be secondary to progression of the patient's known left upper lobe lung mass, compressing the left upper lobe bronchus, as well as significant  interval progression of disease as evidenced by new pathologically enlarged mediastinal lymph nodes as well as multiple new low-attenuation masses in the liver concerning for hepatic metastatic disease, new pulmonary opacities in the right upper and right lower lobes raising concern for additional foci of metastatic disease, significant narrowing of the left lower lobe bronchus, and new osseous involvement involving the posterior fifth and sixth ribs on the left. He was started on antibiotics, bronchodilators and steroids and admitted for further evaluation. He underwent CT guided biopsy on 11/27, path pending. IM and PCCM are on board. Per notes, radiation onc is planning for palliative radiation once diagnosis is confirmed.  Cardiology asked to see for evidence of tachy-brady.  He has had episodic tachycardia with tracings concerning for SVT vs atrial fib yesterday and today as well as sinus bradycardia down into the 40s this morning after receiving IV metoprolol 2.5mg  and starting diltiazem drip.  The drip was discontinued at 9:18am and his HR remains in the 40s four hours later. IV amiodarone was ordered but not started due to bradycardia.  He takes oral metoprolol at home.  BP has been stable to hypertensive.  EKG yesterday PM was read as atrial fib but appears to be SVT initiated by PACs.  He is currently unarousable on BiPAP which was started today.  Per the nurse his mental status has declined throughout the day.   Past Medical History:  Diagnosis Date   Basal cell carcinoma    Chronic venous insufficiency    COPD (chronic obstructive pulmonary disease) (HCC)    Depression    History of heroin use    Lung mass    Lymphedema    Paroxysmal  SVT (supraventricular tachycardia) (HCC)     Past Surgical History:  Procedure Laterality Date   HERNIA REPAIR       Inpatient Medications: Scheduled Meds:  Chlorhexidine Gluconate Cloth  6 each Topical Daily   ipratropium  0.5 mg  Nebulization Q6H   levalbuterol  0.63 mg Nebulization Q6H   mouth rinse  15 mL Mouth Rinse BID   methylPREDNISolone (SOLU-MEDROL) injection  40 mg Intravenous Q12H   mometasone-formoterol  2 puff Inhalation BID   montelukast  10 mg Oral Daily   sodium chloride flush  10-40 mL Intracatheter Q12H   sodium chloride flush  3 mL Intravenous Q12H   Continuous Infusions:  sodium chloride 125 mL/hr at 04/15/19 0900   amiodarone Stopped (04/15/19 0945)   Followed by   amiodarone     amiodarone Stopped (04/15/19 0945)   diltiazem (CARDIZEM) infusion 10 mg/hr (04/15/19 0900)   piperacillin-tazobactam (ZOSYN)  IV Stopped (04/15/19 0915)   potassium chloride 10 mEq (04/15/19 1054)   PRN Meds: acetaminophen **OR** acetaminophen, diltiazem, HYDROcodone-acetaminophen, LORazepam, morphine injection, ondansetron **OR** ondansetron (ZOFRAN) IV, sodium chloride flush, sodium chloride flush  Home Meds: Prior to Admission medications   Medication Sig Start Date End Date Taking? Authorizing Provider  albuterol (ACCUNEB) 0.63 MG/3ML nebulizer solution Take 3 mLs (0.63 mg total) by nebulization every 6 (six) hours as needed for wheezing or shortness of breath. 03/14/19  Yes Tanda Rockers, MD  albuterol (VENTOLIN HFA) 108 (90 Base) MCG/ACT inhaler Inhale 2 puffs into the lungs every 4 (four) hours as needed for wheezing or shortness of breath. 03/14/19  Yes Tanda Rockers, MD  metoprolol tartrate (LOPRESSOR) 25 MG tablet Take 1 tablet (25 mg total) by mouth 2 (two) times daily. 12/07/17  Yes Martyn Ehrich, NP  montelukast (SINGULAIR) 10 MG tablet Take 10 mg by mouth daily. 03/04/19  Yes [provider]  Multiple Vitamin (MULITIVITAMIN WITH MINERALS) TABS Take 1 tablet by mouth daily.    [provider]  predniSONE (DELTASONE) 10 MG tablet Take  4 each am x 2 days,   2 each am x 2 days,  1 each am x 2 days and stop Patient not taking: Reported on 03/20/2019 03/29/19   Tanda Rockers, MD    Allergies:   No Known Allergies  Social History:   Social History   Socioeconomic History   Marital status: Widowed    Spouse name: Not on file   Number of children: Not on file   Years of education: Not on file   Highest education level: Not on file  Occupational History   Not on file  Social Needs   Financial resource strain: Not on file   Food insecurity    Worry: Not on file    Inability: Not on file   Transportation needs    Medical: Not on file    Non-medical: Not on file  Tobacco Use   Smoking status: Current Some Day Smoker    Packs/day: 1.00    Years: 30.00    Pack years: 30.00    Types: Cigarettes   Smokeless tobacco: Never Used  Substance and Sexual Activity   Alcohol use: Yes    Comment: social   Drug use: Yes    Types: Heroin, IV    Comment: heroin  last year   Sexual activity: Never    Comment: opiate abuse  Lifestyle   Physical activity    Days per week: Not on file  Minutes per session: Not on file   Stress: Not on file  Relationships   Social connections    Talks on phone: Not on file    Gets together: Not on file    Attends religious service: Not on file    Active member of club or organization: Not on file    Attends meetings of clubs or organizations: Not on file    Relationship status: Not on file   Intimate partner violence    Fear of current or ex partner: Not on file    Emotionally abused: Not on file    Physically abused: Not on file    Forced sexual activity: Not on file  Other Topics Concern   Not on file  Social History Narrative   Not on file     Family History:   Family History  Problem Relation Age of Onset   Cancer Other    Breast cancer Mother    Colon cancer Father    Breast cancer Sister    Liver cancer Brother       ROS:  Please see the history of present illness.  + LE wounds. All other ROS reviewed and negative.     Physical Exam/Data:   Vitals:   04/15/19  0945 04/15/19 1000 04/15/19 1015 04/15/19 1037  BP: (!) 94/53 100/65 129/67   Pulse: (!) 45 (!) 44 (!) 46 76  Resp: (!) 26 (!) 28 (!) 25 (!) 37  Temp:      TempSrc:      SpO2: 96% 96% 96% 96%  Weight:      Height:        Intake/Output Summary (Last 24 hours) at 04/15/2019 1115 Last data filed at 04/15/2019 0900 Gross per 24 hour  Intake 3631.17 ml  Output 1350 ml  Net 2281.17 ml   Last 3 Weights 04/13/2019 03/20/2019 10/30/2018  Weight (lbs) 177 lb 11.1 oz 180 lb 8.9 oz 186 lb  Weight (kg) 80.6 kg 81.9 kg 84.369 kg     Body mass index is 23.44 kg/m.  Exam pending MD review, will be reported below  EKG:  There are 11 EKGs for review this admission, reviewed each individual one personally, but summary as noted above.  Initial tracing showed NSR 90bpm with PACs/PVCs, baseline artifact EKG yesterday at [facility] read as atrial fib but appears to show NSR with PACs then possible SVT with nonspecific STT changes Last EKG this morning so far shows Sinus bradycardia 43bpm - possible LAE, low voltage QRS, nonspecific STT changes 04/14/19: 19:55- SVT initiated by [facility]  Telemetry:  As above. Pending formal review upon MD arrival to [facility].  Relevant CV Studies: 2D echo 07/2014 Study Conclusions  - Left ventricle: The cavity size was normal. Wall thickness was  normal. Systolic function was normal. The estimated ejection  fraction was in the range of 55% to 60%. Wall motion was normal;  there were no regional wall motion abnormalities. Left  ventricular diastolic function parameters were normal.  - Right ventricle: The cavity size was mildly dilated.  - Right atrium: The atrium was mildly dilated.  Impressions:  - Normal LV function; mild RAE/RVE; trace MR and TR.   Laboratory Data:  High Sensitivity Troponin:   Recent Labs  Lab 04/14/19 2024  TROPONINIHS 21*     Cardiac EnzymesNo results for input(s): TROPONINI in the last 168 hours. No results for input(s): TROPIPOC in the  last 168 hours.  Chemistry Recent Labs  Lab 04/13/19 [phone]  04/14/19 0023 04/14/19 0744 04/14/19 1632 04/14/19 2024 04/15/19 0325  NA 140  --  142  --   --   --  145  K 4.4  --  3.4*  --   --  2.9* 3.3*  CL 104  --  104  --   --   --  109  CO2 24  --  27  --   --   --  28  GLUCOSE 167*  --  99  --   --   --  84  BUN 18  --  18  --   --   --  21  CREATININE 1.07  --  1.13  --   --   --  1.05  CALCIUM 12.3*   < > 10.4* 10.4* 9.9  --  9.5   9.6  GFRNONAA >60  --  >60  --   --   --  >60  GFRAA >60  --  >60  --   --   --  >60  ANIONGAP 12  --  11  --   --   --  8   < > = values in this interval not displayed.    Recent Labs  Lab 04/02/2019 1350 04/13/19 0129  PROT 7.5 6.9  ALBUMIN 3.6 3.2*  AST 35 43*  ALT 16 13  ALKPHOS 81 68  BILITOT 1.1 1.5*   Hematology Recent Labs  Lab 03/30/2019 1441 04/13/19 0129 04/14/19 0023  WBC 8.1 7.0 13.3*  RBC 4.09* 4.27 3.88*  HGB 14.7 15.7 14.3  HCT 45.6 47.8 44.4  MCV 111.5* 111.9* 114.4*  MCH 35.9* 36.8* 36.9*  MCHC 32.2 32.8 32.2  RDW 12.3 12.5 12.5  PLT 153 146* 156   BNP Recent Labs  Lab 03/22/2019 1458  BNP 84.3    DDimer No results for input(s): DDIMER in the last 168 hours.   Radiology/Studies:  Ct Chest W Contrast  Result Date: 04/11/2019 CLINICAL DATA:  Shortness of breath and chest pain. History of basal cell carcinoma. EXAM: CT CHEST WITH CONTRAST TECHNIQUE: Multidetector CT imaging of the chest was performed during intravenous contrast administration. CONTRAST:  67mL OMNIPAQUE IOHEXOL 300 MG/ML  SOLN COMPARISON:  01/08/2019.  PET-CT dated 03/01/2019 FINDINGS: Cardiovascular: The heart size is stable from prior study. The main pulmonary artery is dilated measuring approximately 3.6 cm in diameter. There is no aortic aneurysm. No thoracic aortic dissection. Mediastinum/Nodes: --there are new pathologically enlarged mediastinal lymph nodes measuring up to approximately 2.3 cm in the short axis. There are pathologically  enlarged left hilar lymph nodes. --No axillary lymphadenopathy. --No supraclavicular lymphadenopathy. --Normal thyroid gland. --The esophagus is unremarkable Lungs/Pleura: There is new near complete collapse of the left upper lobe. There is consolidation at the left lung apex which may represent postobstructive pneumonia. The left upper lobe bronchus appears to be occluded. There is significant narrowing of the left lower lobe bronchus. There is some consolidation involving the left lower lobe with a small left-sided pleural effusion. The previously demonstrated left upper lobe lung mass appears to have substantially increased in size but is difficult to distinguish from the collapsed lung. An estimate is that the mass mass measures at least 9.2 x 7.6 cm. There are mild emphysematous changes in the right lung field. There is a growing spiculated 1 cm pulmonary nodule in the right upper lobe (axial series 5, image 54). There is a significant amount of debris within the trachea and right mainstem bronchus. There is a  new pulmonary opacity in the anterior right upper lobe measuring approximately 1.7 cm (axial series 5, image 62). There is some atelectasis versus consolidation involving the right lower lobe. Upper Abdomen: There are new hypoattenuating masses in the liver measuring 1.9 cm in hepatic segment 4A and 1.7 cm in hepatic segment 4 B. Musculoskeletal: There is new destruction of the posterior fifth and sixth ribs on the left consistent with osseous involvement of the known lung mass. IMPRESSION: 1. New near complete collapse of the left upper lobe felt to be secondary to progression of the patient's known left upper lobe lung mass, compressing the left upper lobe bronchus. 2. Findings consistent with significant interval progression of disease as evidenced by new pathologically enlarged mediastinal lymph nodes as well as multiple new low-attenuation masses in the liver concerning for hepatic metastatic  disease. In addition, there are new pulmonary opacities in the right upper and right lower lobes raising concern for additional foci of metastatic disease. 3. Consolidation involving the left lung apex concerning for postobstructive pneumonia. 4. Moderate amount of debris within the trachea and bronchi bilaterally. 5. Significant narrowing of the left lower lobe bronchus. There is consolidation involving the left lower lobe with a small left-sided pleural effusion. 6. New osseous involvement involving the posterior fifth and sixth ribs on the left. Electronically Signed   By: Constance Holster M.D.   On: 04/10/2019 17:44   Ct Biopsy  Result Date: 04/13/2019 INDICATION: Concern for metastatic lung cancer. Please perform CT-guided biopsy of enlarging hypermetabolic soft tissue between the posterior aspect of left sixth and seventh ribs for tissue diagnostic purposes. EXAM: CT-GUIDED BIOPSY OF HYPERMETABOLIC SOFT TISSUE BETWEEN THE POSTERIOR ASPECTS OF THE LEFT SIXTH AND SEVENTH RIBS. COMPARISON:  Chest CT-04/01/2019; 01/08/2019; PET-CT-03/01/2019 MEDICATIONS: None. ANESTHESIA/SEDATION: Fentanyl 100 mcg IV; Versed 2 mg IV Sedation time: 14 minutes; The patient was continuously monitored during the procedure by the interventional radiology nurse under my direct supervision. CONTRAST:  None. COMPLICATIONS: None immediate. PROCEDURE: Informed consent was obtained from the patient following an explanation of the procedure, risks, benefits and alternatives. A time out was performed prior to the initiation of the procedure. The patient was positioned prone on the CT table and a limited CT was performed for procedural planning demonstrating unchanged size and appearance of the ill-defined infiltrative soft tissue involving the left posterior chest wall between the left sixth and seventh ribs with dominant soft tissue nodule measuring approximately 2.3 x 1.6 cm (image 17, series 2), previously found to be hypermetabolic  on preceding PET-CT. The procedure was planned. The operative site was prepped and draped in the usual sterile fashion. Appropriate trajectory was confirmed with a 22 gauge spinal needle after the adjacent tissues were anesthetized with 1% Lidocaine with epinephrine. Under intermittent CT guidance, a 17 gauge coaxial needle was advanced into the peripheral aspect of the mass. Appropriate positioning was confirmed and 5 core needle biopsy samples were obtained with an 18 gauge core needle biopsy device. The co-axial needle was removed and hemostasis was achieved with manual compression. A limited postprocedural CT was negative for pneumothorax, hemorrhage or additional complication. A dressing was placed. The patient tolerated the procedure well without immediate postprocedural complication. IMPRESSION: Technically successful CT guided core needle biopsy of infiltrative hypermetabolic soft tissue nodule between the posterior aspects of the left sixth and seventh ribs. Electronically Signed   By: Sandi Mariscal M.D.   On: 04/13/2019 13:53   Dg Chest Port 1 View  Result Date: 04/15/2019 CLINICAL DATA:  Dyspnea EXAM: PORTABLE CHEST 1 VIEW COMPARISON:  Chest x-rays dated 04/14/2019 04/03/2019. FINDINGS: Continued complete opacification of the LEFT hemithorax. RIGHT lung remains clear. IMPRESSION: 1. Continued complete opacification of the LEFT hemithorax, compatible with previous description of a known malignancy and associated postobstructive atelectasis. 2. RIGHT lung remains clear. Electronically Signed   By: Franki Cabot M.D.   On: 04/15/2019 08:52   Dg Chest Port 1 View  Result Date: 04/14/2019 CLINICAL DATA:  Shortness of breath EXAM: PORTABLE CHEST 1 VIEW COMPARISON:  Chest radiograph 03/23/2019 FINDINGS: Monitoring leads overlie the patient. Interval worsening complete opacification left hemithorax. Minimal right basilar atelectasis. Thoracic spine degenerative changes. IMPRESSION: Interval worsening of  opacification of the left hemithorax most compatible with known malignancy and associated postobstructive atelectasis. Electronically Signed   By: Lovey Newcomer M.D.   On: 04/14/2019 17:39   Dg Chest Port 1 View  Result Date: 04/13/2019 CLINICAL DATA:  Patient reports to the ED with complaint of SOB and chest pain. Patient has a hx of lymphoma and reports he has been receiving care. [name] legs have an odor and his skin is dry and flaky EXAM: PORTABLE CHEST 1 VIEW COMPARISON:  10/27/2018.  PET-CT, 03/01/2019. FINDINGS: Near complete opacification of the left hemithorax. This has developed since prior chest radiograph, and since the more recent prior PET-CT. The opacification is consistent combination of the posterior left upper lobe hypermetabolic mass noted on the prior PET-CT in combination atelectasis or postobstructive consolidation. Pleural fluid is also suspected. Right lung is hyperexpanded, but otherwise clear. No right pleural effusion. No pneumothorax. IMPRESSION: 1. Significant interval worsening left lung aeration. Left hemithorax is now mostly opacified consistent with a combination of the presumed left upper lobe malignancy with postobstructive change/atelectasis and probable pleural fluid. Electronically Signed   By: Lajean Manes M.D.   On: 04/14/2019 14:25    Assessment and Plan:   1. Acute hypoxic respiratory failure and post-obstructive in the context of left lung mass with collapse and compression of airways, and suspected metastatic spread as outlined above - ongoing management with PCCM, IM, oncology.  2. Tachycardia and bradycardia - initial concern noted for atrial fib due to irregularity of EKG, but I am not convinced based on the 11/28 EKG - may represent PSVT preceded by NSR/PACs instead. This is complicated by development of bradycardia when back in NSR. This note was prepped remotely to minimize patient contact/exposure during Covid-19 pandemic and therefore is pending  telemetry review by our team on site at [facility]. Further recs as outlined below by MD.  3. Suppressed TSH - has been suppressed in 2014 as well, but normal in 2016. ? Contributing to tachyarrhythmias. Will send fT4 and total T3. Not on any thyroid supplementation.  4. Chronic venous insufficiency with lymphedema - pending wound consult per notes.  5. Electrolyte disturbances including hypercalcemia, hypokalemia, hypomagnesemia - management per primary teams.  6. Marginally elevated troponin - hsTroponin 21 on 04/14/19, no trends. This is c/w underlying demand of clinical picture. No further workup planned in context of primary picture.  For questions or updates, please contact [place] Please consult [email or web address] for contact info under   Signed, Charlie Pitter, PA-C  04/15/2019 11:15 AM    Attending addendum:  History and all data above reviewed.  Patient examined.  I agree with the findings as above.  The patient exam reveals:  VS:  BP (!) 152/68    Pulse (!) 113    Temp 98.5 F (36.9 C) (Oral)  Resp (!) 30    Ht 6\' 1"  (1.854 m)    Wt 80.6 kg    SpO2 98%    BMI 23.44 kg/m  , BMI Body mass index is 23.44 kg/m. GENERAL:  Critically ill-appearing.  Will not arouse or open eyes.  Moves all four extremities in response to touch.  Does not move to voice.  On BiPAP. Not answering questions. NECK:  No jugular venous distention, waveform within normal limits, carotid upstroke brisk and symmetric, no bruits LUNGS:  Coarse breath sounds.  Unable to listen posteriorly  HEART: Irregular.  PMI not displaced or sustained,S1 and S2 within normal limits, no S3, no S4, no clicks, no rubs, no murmurs ABD:  Flat, positive bowel sounds normal in frequency in pitch, no bruits, no rebound, no guarding, no midline pulsatile mass, no hepatomegaly, no splenomegaly EXT:  2 plus pulses throughout, R>L LE edema, no cyanosis no clubbing SKIN:  No rashes no nodules NEURO:  Moves all 4 extremities.  PSYCH:   Unable to assess.  Patient non-responsive.  All available labs, radiology testing, previous records reviewed. Agree with documented assessment and plan. Mr. Grabel is a 28M with severe COPD, post-obstructive pneumonia, L lung mass s/p biopsy, LUL collapse, and ongoing tobacco abuse admitted with acute on chronic hypoxic respiratory failure.  Cardiology consulted for episodes of SVT and bradycardia on metoprolol and diltiazem.  His arrhythmias are likely driven by his underlying lung disease.  He remains persistently bradycardic hours after receiving IV nodal agents.  While outside the room he had recurrent SVT at 200bpm that broke after carotid massage.  Will start amiodarone 150mg  bolus followed by drip.  Ideally from a cardiac standpoint he would be at [facility].  However, given his underlying medical condition and respiratory instability, he isn't a very good candidate for cardiac pacing, either temporary or permanent.  Therefore, we will avoid transfer at this time.  Transfer will likely delay his ability to treat the primary concern, his malignancy.  Avoid nodal agents given bradycardia. Maintain K>4, Mg>2.  Time spent: 45 minutes-Greater than 50% of this time was spent in counseling, explanation of diagnosis, planning of further management, and coordination of care.  Khaniyah Bezek C. Oval Linsey, MD, [facility]  04/15/2019 1:07 PM

## 2019-04-16 ENCOUNTER — Ambulatory Visit
Admit: 2019-04-16 | Discharge: 2019-04-16 | Disposition: A | Discharge status: Home / Self Care | Payer: Medicare Other | Attending: Radiation Oncology | Admitting: Radiation Oncology

## 2019-04-16 ENCOUNTER — Inpatient Hospital Stay: Payer: Self-pay

## 2019-04-16 DIAGNOSIS — C3412 Malignant neoplasm of upper lobe, left bronchus or lung: Secondary | ICD-10-CM

## 2019-04-16 DIAGNOSIS — J9811 Atelectasis: Secondary | ICD-10-CM

## 2019-04-16 LAB — CBC
HCT: 46.6 % (ref 39.0–52.0)
Hemoglobin: 15.2 g/dL (ref 13.0–17.0)
MCH: 36.2 pg — ABNORMAL HIGH (ref 26.0–34.0)
MCHC: 32.6 g/dL (ref 30.0–36.0)
MCV: 111 fL — ABNORMAL HIGH (ref 80.0–100.0)
Platelets: 100 10*3/uL — ABNORMAL LOW (ref 150–400)
RBC: 4.2 MIL/uL — ABNORMAL LOW (ref 4.22–5.81)
RDW: 12.2 % (ref 11.5–15.5)
WBC: 11.2 10*3/uL — ABNORMAL HIGH (ref 4.0–10.5)
nRBC: 0 % (ref 0.0–0.2)

## 2019-04-16 LAB — BASIC METABOLIC PANEL
Anion gap: 12 (ref 5–15)
Anion gap: 13 (ref 5–15)
BUN: 25 mg/dL — ABNORMAL HIGH (ref 8–23)
BUN: 26 mg/dL — ABNORMAL HIGH (ref 8–23)
CO2: 23 mmol/L (ref 22–32)
CO2: 20 mmol/L — ABNORMAL LOW (ref 22–32)
Calcium: 9.2 mg/dL (ref 8.9–10.3)
Calcium: 9 mg/dL (ref 8.9–10.3)
Chloride: 112 mmol/L — ABNORMAL HIGH (ref 98–111)
Chloride: 112 mmol/L — ABNORMAL HIGH (ref 98–111)
Creatinine, Ser: 1.02 mg/dL (ref 0.61–1.24)
Creatinine, Ser: 1.08 mg/dL (ref 0.61–1.24)
GFR calc Af Amer: >60 mL/min (ref >60)
GFR calc Af Amer: >60 mL/min (ref >60)
GFR calc non Af Amer: >60 mL/min (ref >60)
GFR calc non Af Amer: >60 mL/min (ref >60)
Glucose, Bld: 104 mg/dL — ABNORMAL HIGH (ref 70–99)
Glucose, Bld: 136 mg/dL — ABNORMAL HIGH (ref 70–99)
Potassium: 3.7 mmol/L (ref 3.5–5.1)
Potassium: 3.9 mmol/L (ref 3.5–5.1)
Sodium: 147 mmol/L — ABNORMAL HIGH (ref 135–145)
Sodium: 145 mmol/L (ref 135–145)

## 2019-04-16 LAB — MAGNESIUM
Magnesium: 1.8 mg/dL (ref 1.7–2.4)
Magnesium: 1.8 mg/dL (ref 1.7–2.4)
Magnesium: 1.7 mg/dL (ref 1.7–2.4)

## 2019-04-16 LAB — PHOSPHORUS
Phosphorus: 1.2 mg/dL — ABNORMAL LOW (ref 2.5–4.6)
Phosphorus: <1 mg/dL — CL (ref 2.5–4.6)

## 2019-04-16 LAB — GLUCOSE, CAPILLARY
Glucose-Capillary: 127 mg/dL — ABNORMAL HIGH (ref 70–99)
Glucose-Capillary: 146 mg/dL — ABNORMAL HIGH (ref 70–99)
Glucose-Capillary: 157 mg/dL — ABNORMAL HIGH (ref 70–99)

## 2019-04-16 LAB — CALCIUM
Calcium: 9 mg/dL (ref 8.9–10.3)
Calcium: 8.9 mg/dL (ref 8.9–10.3)

## 2019-04-16 MED ORDER — METOPROLOL TARTRATE 5 MG/5ML IV SOLN
5.0000 mg | Freq: Once | INTRAVENOUS | Status: AC
  Administered 2019-04-16: 5 mg via INTRAVENOUS

## 2019-04-16 MED ORDER — PRO-STAT SUGAR FREE PO LIQD
30.0000 mL | Freq: Two times a day (BID) | ORAL | Status: DC
  Filled 2019-04-16: qty 30

## 2019-04-16 MED ORDER — AMIODARONE IV BOLUS ONLY 150 MG/100ML
150.0000 mg | Freq: Once | INTRAVENOUS | Status: DC
  Filled 2019-04-16: qty 100

## 2019-04-16 MED ORDER — POTASSIUM CHLORIDE 20 MEQ/15ML (10%) PO SOLN
40.0000 meq | Freq: Once | ORAL | Status: AC
  Administered 2019-04-16: 40 meq
  Filled 2019-04-16: qty 30

## 2019-04-16 MED ORDER — PREDNISONE 5 MG/5ML PO SOLN
30.0000 mg | Freq: Every day | ORAL | Status: AC
  Administered 2019-04-17: 30 mg
  Filled 2019-04-16 (×2): qty 30

## 2019-04-16 MED ORDER — FREE WATER
100.0000 mL | Freq: Three times a day (TID) | Status: DC
  Administered 2019-04-16 – 2019-04-17 (×3): 100 mL

## 2019-04-16 MED ORDER — VITAL HIGH PROTEIN PO LIQD: 1000.0000 mL | ORAL | Status: DC

## 2019-04-16 MED ORDER — CHLORHEXIDINE GLUCONATE 0.12% ORAL RINSE (MEDLINE KIT)
15.0000 mL | Freq: Two times a day (BID) | OROMUCOSAL | Status: DC
  Administered 2019-04-16 – 2019-04-28 (×18): 15 mL via OROMUCOSAL

## 2019-04-16 MED ORDER — METOPROLOL TARTRATE 5 MG/5ML IV SOLN
INTRAVENOUS | Status: AC
  Filled 2019-04-16: qty 5

## 2019-04-16 MED ORDER — PREDNISONE 5 MG/5ML PO SOLN
30.0000 mg | Freq: Every day | ORAL | Status: DC
  Administered 2019-04-16: 30 mg via ORAL
  Filled 2019-04-16: qty 30

## 2019-04-16 MED ORDER — PANTOPRAZOLE SODIUM 40 MG IV SOLR
40.0000 mg | Freq: Every day | INTRAVENOUS | Status: DC
  Administered 2019-04-17: 40 mg via INTRAVENOUS
  Filled 2019-04-16 (×2): qty 40

## 2019-04-16 MED ORDER — MAGNESIUM SULFATE IN D5W 1-5 GM/100ML-% IV SOLN
1.0000 g | Freq: Once | INTRAVENOUS | Status: AC
  Administered 2019-04-16: 1 g via INTRAVENOUS
  Filled 2019-04-16: qty 100

## 2019-04-16 MED ORDER — ORAL CARE MOUTH RINSE
15.0000 mL | OROMUCOSAL | Status: DC
  Administered 2019-04-16 – 2019-04-20 (×33): 15 mL via OROMUCOSAL

## 2019-04-16 MED ORDER — MONTELUKAST SODIUM 10 MG PO TABS
10.0000 mg | ORAL_TABLET | Freq: Every day | ORAL | Status: DC
  Administered 2019-04-16 – 2019-04-28 (×12): 10 mg via ORAL
  Filled 2019-04-16 (×12): qty 1

## 2019-04-16 MED ORDER — VITAL AF 1.2 CAL PO LIQD
1000.0000 mL | ORAL | Status: DC
  Administered 2019-04-16 – 2019-04-18 (×3): 1000 mL

## 2019-04-16 MED ORDER — ENOXAPARIN SODIUM 40 MG/0.4ML ~~LOC~~ SOLN
40.0000 mg | SUBCUTANEOUS | Status: DC
  Administered 2019-04-16 – 2019-04-28 (×13): 40 mg via SUBCUTANEOUS
  Filled 2019-04-16 (×13): qty 0.4

## 2019-04-16 MED ORDER — HYDROCERIN EX CREA
TOPICAL_CREAM | Freq: Two times a day (BID) | CUTANEOUS | Status: AC
  Administered 2019-04-16 – 2019-04-20 (×8): via TOPICAL
  Administered 2019-04-20: 1 via TOPICAL
  Filled 2019-04-16: qty 113

## 2019-04-16 NOTE — Progress Notes (Signed)
PT Cancellation Note  Patient Details Name: Omar Wagner MRN: [id number] DOB: 1955/07/24   Cancelled Treatment:    Reason Eval/Treat Not Completed: Medical issues which prohibited therapy   Weston Anna, PT Acute Rehabilitation Services Pager: [phone] Office: [phone]

## 2019-04-16 NOTE — Progress Notes (Signed)
OT Cancellation Note  Patient Details Name: Omar Wagner MRN: [id number] DOB: [birth year]   Cancelled Treatment:    Reason Eval/Treat Not Completed: Medical issues which prohibited therapy   Kari Baars, OT Acute Rehabilitation Services [phone] Office- [phone], Edwena Felty D 04/16/2019, 12:16 PM

## 2019-04-16 NOTE — Progress Notes (Signed)
eLink Physician-Brief Progress Note Patient Name: Omar Wagner DOB: 05-May-1956 MRN: [id number]   Date of Service  04/16/2019  HPI/Events of Note  Episode of AFIB with RVR - Ventricular rate = 190's. Now in NSR with rate = 112. Amiodarone IV infusion restarted.   eICU Interventions  Plan: 1. Further management per cardiology.      Intervention Category Major Interventions: Arrhythmia - evaluation and management  Venisa Frampton [place] 04/16/2019, 5:31 AM

## 2019-04-16 NOTE — Progress Notes (Signed)
Peripherally Inserted Central Catheter/Midline Placement  The IV Nurse has discussed with the patient and/or persons authorized to consent for the patient, the purpose of this procedure and the potential benefits and risks involved with this procedure.  The benefits include less needle sticks, lab draws from the catheter, and the patient may be discharged home with the catheter. Risks include, but not limited to, infection, bleeding, blood clot (thrombus formation), and puncture of an artery; nerve damage and irregular heartbeat and possibility to perform a PICC exchange if needed/ordered by physician.  Alternatives to this procedure were also discussed.  Bard Power PICC patient education guide, fact sheet on infection prevention and patient information card has been provided to patient /or left at bedside.    PICC/Midline Placement Documentation  PICC Triple Lumen 36/12/24 PICC Right Basilic 40 cm 1 cm (Active)  Indication for Insertion or Continuance of Line Vasoactive infusions 04/16/19 1100  Exposed Catheter (cm) 1 cm 04/16/19 1100  Site Assessment Clean;Dry;Intact 04/16/19 1100  Lumen #1 Status Flushed;Saline locked;Blood return noted 04/16/19 1100  Lumen #2 Status Flushed;Saline locked;Blood return noted 04/16/19 1100  Lumen #3 Status Flushed;Saline locked;Blood return noted 04/16/19 1100  Dressing Type Transparent;Securing device 04/16/19 1100  Dressing Status Clean;Dry;Intact;Antimicrobial disc in place 04/16/19 [place] checked and tightened 04/16/19 1100  Dressing Intervention New dressing;Other (Comment) 04/16/19 1100  Dressing Change Due 04/23/19 04/16/19 1100    Verbal consent obtained from 2 ICU RNs to patient sister over the phone   Virgilio Belling 04/16/2019, 11:58 AM

## 2019-04-16 NOTE — Progress Notes (Signed)
Omar Wagner, RT requesting to sim patient at 0930 the morning. Phoned ICU nurse, Hildred Alamin, with update. She reports the patient's heart rate has been between 38-over 200 and before arrangements can be made for the patient to be simulated the physician needs to make rounds. Provided her with my direct contact number and requested she phone back with an update. SIM staff and Shona Simpson, PA-C informed.

## 2019-04-16 NOTE — Progress Notes (Signed)
eLink Physician-Brief Progress Note Patient Name: GIULIO BERTINO DOB: 1955/07/28 MRN: [id number]   Date of Service  04/16/2019  HPI/Events of Note  Patient is intubated and ventilated - Notified of need for stress ulcer prophylaxis.   eICU Interventions  Will order: 1. Protonix IV.      Intervention Category Intermediate Interventions: Best-practice therapies (e.g. DVT, beta blocker, etc.)  Sommer,Steven Eugene 04/16/2019, 2:51 AM

## 2019-04-16 NOTE — Progress Notes (Signed)
Received call from [place], Gooding caring for patient in ICU today. She reports that per Marni Griffon with Dr. Lynetta Mare the patient should not leave the floor for simulation today. Will inform SIM staff and Shona Simpson, PA-C of these findings.

## 2019-04-16 NOTE — Progress Notes (Signed)
Patient had another burst of SVT lasting 10 minutes, heartrate 178-186. Vagal maneuver, deep suctioning, all unsuccessful to break SVT. MD notified and ordered EKG with 5mg  metopralol. EKG showed SVT. Metopralol broke SVT and heartrate dropped to 70 in sinus. Will continue to monitor.

## 2019-04-16 NOTE — Progress Notes (Signed)
Patient went into an SVT rhythm at approximately 0519. HR 190-205 bpm. BP 110/90 (97) during SVT event. SVT lasted for approximately 10 minutes. At 0529, HR converted back to NSR/ST with a heart rate of 96-118 bpm. Heart rate irregular, P waves are present. BP 135/95 (107). Elink RN viewed into room and instructed that Amio gtt be restarted at last rate infusing and RN sent page to cardiology to inform them of sustained SVT. Will continue to monitor and awaiting further orders.

## 2019-04-16 NOTE — Progress Notes (Addendum)
NAME:  Omar Wagner, MRN:  [id number], DOB:  09-Mar-1956, LOS: 4 ADMISSION DATE:  03/25/2019, CONSULTATION DATE:  04/13/19 REFERRING MD: Louellen Molder, MD CHIEF COMPLAINT:  Lung mass with mets  Brief History   63 year old male with very severe COPD (FEV1 33%) who presents with acute hypoxemic respiratory failure secondary to post-obstructive pneumonia in setting of LUL compression/collapse from left lung mass .  Past Medical History  Very severe COPD (FEV1 33%) in 2018 Depression Hx basal cell [facility] Events   11/26 Admitted 11/27 CT guided biopsy of soft tissue nodule in the left posterior chest wall 11/28 pulmonary consulted.  Recommended routine bronchodilator therapy antibiotics and prednisone taper 11/29: Episodes of hypertension As well as bradycardia.  Alternating with paroxysmal atrial fibrillation with rapid ventricular response/SVT.  Increasing oxygen requirements noted cardiology consulted.  Started amiodarone, felt not a candidate for pacing.  Developed worsening respiratory failure requiring intubation.  Underwent bronchoscopy at bedside showing extensive endobronchial tumor burden in the left upper lobe with extrinsic compression on the left lower lobe. -Having recurrent bradycardia so amiodarone held later in the evening, 11/30: Went back into SVT early a.m. hours.  Heart rate 198 205.  Amiodarone drip resumed.  Also receiving as needed Lopressor.  Radiation simulation deferred due to dynamic instability  Consults:  TRH IR PCCM Rad/Onc  Procedures:    Significant Diagnostic Tests:  PET 03/01/19 1. Intensely hypermetabolic (max SUV 73.4) large poorly marginated 8.5 x 4.8 cm posterior left upper lobe lung mass with direct peripheral left chest wall invasion between the lateral left fifth and sixth ribs and direct left paraspinal invasion at the T4 level, significantly increased in size since 01/08/2019 chest CT, compatible with aggressive  locally advanced primary bronchogenic carcinoma. 2. Hypermetabolic ipsilateral mediastinal lymph node metastases in the AP window and left paraesophageal chains. 3. Hypermetabolic distant nodal metastasis in the left external iliac chain. 4. Hypermetabolic posterior left chest wall metastasis between left sixth and seventh ribs. 5. Hypermetabolic osseous metastases to the left sixth rib. 6. PET-CT stage IVB (T4 N2 M1c). 7. New 5 mm right upper lobe pulmonary nodule and slight growth of 4 mm right middle lobe pulmonary nodule, below PET resolution, cannot exclude pulmonary metastases. 8. Aortic Atherosclerosis (ICD10-I70.0) and Emphysema (ICD10-J43.9).  CT Chest W Contrast 04/15/2019 1. New near complete collapse of the left upper lobe felt to be secondary to progression of the patient's known left upper lobe lung mass, compressing the left upper lobe bronchus. 2. Findings consistent with significant interval progression of disease as evidenced by new pathologically enlarged mediastinal lymph nodes as well as multiple new low-attenuation masses in the liver concerning for hepatic metastatic disease. In addition, there are new pulmonary opacities in the right upper and right lower lobes raising concern for additional foci of metastatic disease. 3. Consolidation involving the left lung apex concerning for postobstructive pneumonia. 4. Moderate amount of debris within the trachea and bronchi bilaterally. 5. Significant narrowing of the left lower lobe bronchus. There is consolidation involving the left lower lobe with a small left-sided pleural effusion. 6. New osseous involvement involving the posterior fifth and sixth ribs on the left.  Micro Data:  BCx 04/01/2019 Sputum Cx 04/13/19  Antimicrobials:  Vanc 11/26> Cefepime 11/26> Ceftriaxone 11/26> Azithro 11/26>  Interim history/subjective:  Still having trouble with heart rate instability  Objective   Blood pressure 131/88,  pulse (Abnormal) 101, temperature 100 F (37.8 C), resp. rate (Abnormal) 28, height 6\' 1"  (1.854  m), weight 80.6 kg, SpO2 97 %.    Vent Mode: PRVC FiO2 (%):  [50 %-100 %] 50 % Set Rate:  [18 bmp-28 bmp] 28 bmp Vt Set:  [470 mL] 470 mL PEEP:  [6 cmH20-8 cmH20] 8 cmH20 Plateau Pressure:  [16 cmH20-20 cmH20] 19 cmH20   Intake/Output Summary (Last 24 hours) at 04/16/2019 0915 Last data filed at 04/16/2019 0800 Gross per 24 hour  Intake 2313.66 ml  Output 1000 ml  Net 1313.66 ml   Filed Weights   04/01/2019 2130 04/13/19 0540  Weight: 81.9 kg 80.6 kg   Physical Exam:  General this is a chronically ill 63 year old male currently on full ventilatory support but in no acute distress HEENT normocephalic atraumatic no jugular venous distention mucous membranes are moist orally intubated Pulmonary: Clear, no accessory use, diminished on the left, PEEP currently at 8, FiO2 40% Cardiac: Bradycardic currently, no murmur rub or gallop.  Heart rate in the 40s, no further SVT since amiodarone and Lopressor Abdomen: Soft not tender no organomegaly Extremities: Erythemic, chronic venous dermatitis with lower extremity edema pulses are palpable.  Seen by wound ostomy nurse recommending Eucerin Neuro: Awake oriented following commands no focal deficit appreciated GU: Clear yellow urine  Resolved Hospital Problem list   N/A  Assessment & Plan:  63 year old male with very severe COPD (FEV1 33%) admitted for COPD exacerbation secondary to postobstructive pneumonia.  Acute hypoxic and hypercarbic respiratory failure in the setting left upper lobe lung mass with associated postobstructive atelectasis/pneumonia, further complicated by COPD exacerbation -Required intubation 11/29 -Bronchoscopy performed post intubation demonstrated extensive endobronchial burden -Portable chest x-ray personally reviewed: This demonstrated post intubation endotracheal tube in satisfactory position.  He has complete  opacification of the left hemithorax.  There is some mild right basilar bibasilar atelectasis. Plan Cont full vent support We can change Solu-Medrol back to prednisone, today will be day 1 of 5 Continue scheduled bronchodilators PAD protocol with RASS goal -1 Antibiotic day #5 of 7.  Currently on mono coverage with Zosyn He does indeed need radiation therapy if we hope to get him off the ventilator, need to get heart rhythm under better control before we proceed with this (I have told RN staff if we can keep HR stable up to 1 pm today we could proceed w/ XRT) Still awaiting pathology from biopsy  AFRVR, previously bradycardic. -Having significant difficulty with bursts of SVT, this was complicated by bradycardia intermittently as well -Cardiology was consulted, he is back on amiodarone since early this morning Plan Continue telemetry monitoring I would tolerate bradycardia as long as he has perfusing blood pressure Continue amiodarone Await echo Deemed not a candidate for pacemaker per cardiology Ensure potassium greater than 4 and magnesium greater than 2 He is extremely high risk for bleeding, will hold off on systemic heparin for now  Venous stasis dermatitis involving bilateral lower extremities Plan Eucerin to bilateral lower extremities  Mild nonanion gap metabolic acidosis in the setting of hyperchloremia Plan Discontinue sodium chloride LR as maintenance IV fluids Free water replacement A.m. chemistry  Mild thrombocytopenia Plan Trend CBC  Best practice:  Diet: Tube feeds started 11/30 Pain/Anxiety/Delirium protocol (if indicated): 11/29 VAP protocol (if indicated): 11/29 DVT prophylaxis: Start low molecular weight heparin 11/29 GI prophylaxis: Per primary Glucose control: Per primary Mobility: Mobility as tolerate Code Status: Full Family Communication: Updated patient on 11/27 Disposition: He remains critically ill due to ventilator dependence and  tachybradycardia syndrome.  Not a candidate for pacemaker.  Needs radiation  therapy if we have any hope of getting him liberated from the ventilator  My critical care x32 minutes  Omar Wagner ACNP-BC [place] Pager # [phone] OR # [phone] if no answer

## 2019-04-16 NOTE — Progress Notes (Signed)
[name] belongings were sent home with friend, [name], with permission from the patient. This includes his cellphone, clothes, and wallet. The only belonging still at bedside is a sweater in the closet.

## 2019-04-16 NOTE — Progress Notes (Signed)
I spoke with Dr. Loanne Drilling this morning and with Ms. Stormy Card, the patient's appointed surrogate medical decision maker. We spoke about the current situation and the respiratory failure he has as a result of a tumor in the LUL. Pathology is pending at this time and I've reached out to Dr. Vic Ripper to try and find out preliminary results. After discussing his case, I let Ms. Salmon know that we're waiting for permission for Mr. Roam to leave the floor as he had some cardiac issues overnight. We are currently planning for him to come at 9:30 am for simulation and same day Aziel and start XRT to the left chest.We discussed the risks, benefits, short, and long term effects of radiotherapy, and verbal consent from Stormy Card has been given. Dr. Lisbeth Renshaw anticipates a course of 2 weeks of radiotherapy. I will follow back up with Ms. Salmon once we have more definitive results from pathology.    [name], PAC

## 2019-04-16 NOTE — Progress Notes (Signed)
eLink Physician-Brief Progress Note Patient Name: Omar Wagner DOB: 1956/04/29 MRN: [id number]   Date of Service  04/16/2019  HPI/Events of Note  Agitation - Request to renew bilateral soft wrist restraints.   eICU Interventions  Will renew bilateral soft wrist restraints X 9 hours.      Intervention Category Major Interventions: Delirium, psychosis, severe agitation - evaluation and management  Sommer,Steven Eugene 04/16/2019, 11:42 PM

## 2019-04-16 NOTE — Progress Notes (Signed)
Pt transported to radiation via transport vent. No complications during route. Pt is back in room. RT will continue to monitor.

## 2019-04-16 NOTE — Progress Notes (Signed)
Initial Nutrition Assessment  DOCUMENTATION CODES:   Not applicable  INTERVENTION:  - will adjust TF regimen: Vital AF 1.2 @ 40 ml/hr to advance by 10 ml every 4 hours to reach goal rate of 70 ml/hr. - at goal rate, this regimen will provide 2016 kcal (95% estimated kcal need), 126 grams protein, and 1362 ml free water.  - free water flush per MD/NP--current order for 100 ml TD.    NUTRITION DIAGNOSIS:   Inadequate oral intake related to inability to eat as evidenced by NPO status.  GOAL:   Patient will meet greater than or equal to 90% of their needs  MONITOR:   Vent status, TF tolerance, Labs, Weight trends, Skin  REASON FOR ASSESSMENT:   Ventilator, Consult Enteral/tube feeding initiation and management  ASSESSMENT:   63 y.o. male with medical history significant of COPD, lung mass, bilateral lymphedema, areas of ulceration to BLE d/t chronic venous insufficiency, squamous cell carcinoma and basal cell carcinoma. He presented to the ED with SOB and cough productive of sputum. Prednisone and inhalers were used PTA but did not provide relief. He has a known lung mass which was seen on CT.  Patient was intubated yesterday afternoon after becoming obtunded 2/2 acute hypercarbic respiratory failure. Bronch was performed shortly after intubation and showed endobronchial tumor burden in LUL and compression of airway in LLL; no biopsy done.  Able to talk with RN who reports hopeful plan for radiation early this afternoon. Plan to start TF at this time. OGT is currently in place.   Patient opened eyes during RD visit and was able to nod/shake head. He denied any pain in any location.   Per chart review, current weight is 178 lb and weight on 10/30/18 was 186 lb. This indicates 8 lb weight loss (4.3% body weight) in the past 5.5 months; not significant for time frame but unsure if weight loss has occurred more acutely. Weights in Care Everywhere are from 2014 and 2017.  Per notes: -  episodes of HTN, bradycardia, and afib with RVR/SVT - radiation simulation deferred d/t instability  - LUL lung mass with associated post-obstructive atelectasis/PNA - COPD exacerbation  - venous stasis dermatitis to BLE - metabolic acidosis in the setting of hyperchloremia  - mild thrombocytopenia    Patient is currently intubated on ventilator support MV: 13.3 L/min Temp (24hrs), Avg:99.9 F (37.7 C), Min:98.5 F (36.9 C), Max:100.2 F (37.9 C) Propofol: none BP: 127/83 and MAP: 99  Labs reviewed; Na: 147 mmol/l, Cl; 112 mmol/l, BUN: 25 mg/dl. Medications reviewed; 1 g IV Mg sulfate x1 run 11/30, 10 mEq IV KCl x4 runs 11/29, 40 mEq KCl per OGT x2 doses 11/30, 30 mg prednisone per OGT/day x5 days starting 11/30. Drips; fentanyl @ 100 mcg/hr, amio @ 30 mg/hr.     NUTRITION - FOCUSED PHYSICAL EXAM:  completed; no muscle or fat wasting, mild edema and scaly skin to BLE.   Diet Order:   Diet Order            Diet NPO time specified  Diet effective now              EDUCATION NEEDS:   No education needs have been identified at this time  Skin:  Skin Assessment: Reviewed RN Assessment  Last BM:  11/29  Height:   Ht Readings from Last 1 Encounters:  04/05/2019 6\' 1"  (1.854 m)    Weight:   Wt Readings from Last 1 Encounters:  04/13/19 80.6 kg  Ideal Body Weight:  83.6 kg  BMI:  Body mass index is 23.44 kg/m.  Estimated Nutritional Needs:   Kcal:  2113 kcal  Protein:  120-137 grams (1.5-1.7 grams/kg)  Fluid:  >/= 2.1 L/day     Jarome Matin, MS, RD, LDN, [facility] Inpatient Clinical Dietitian Pager # [phone] After hours/weekend pager # [phone]

## 2019-04-16 NOTE — Progress Notes (Signed)
Cardiology Progress Note  Patient ID: Omar Wagner MRN: [id number] DOB: 1955/12/26 Date of Encounter: 04/16/2019  Primary Cardiologist: No primary care provider on file.  Subjective  Intermittent episodes of SVT.  They appear to be regular.  He also has intermittent episodes of bradycardia with frequent ectopic atrial rhythm.  Heart rate gets down into the 30-40 range but appears to be no symptoms from this.  He remains intubated.  ROS:  All other ROS reviewed and negative. Pertinent positives noted in the HPI.     Inpatient Medications  Scheduled Meds: . chlorhexidine gluconate (MEDLINE KIT)  15 mL Mouth Rinse BID  . Chlorhexidine Gluconate Cloth  6 each Topical Daily  . enoxaparin (LOVENOX) injection  40 mg Subcutaneous Q24H  . free water  100 mL Per Tube Q8H  . hydrocerin   Topical BID  . ipratropium  0.5 mg Nebulization Q6H  . levalbuterol  0.63 mg Nebulization Q6H  . mouth rinse  15 mL Mouth Rinse 10 times per day  . metoprolol tartrate      . mometasone-formoterol  2 puff Inhalation BID  . montelukast  10 mg Oral QHS  . pantoprazole (PROTONIX) IV  40 mg Intravenous Daily  . [START ON 04/17/2019] predniSONE  30 mg Per Tube Daily  . sodium chloride flush  10-40 mL Intracatheter Q12H   Continuous Infusions: . amiodarone 30 mg/hr (04/16/19 0800)  . amiodarone    . feeding supplement (VITAL AF 1.2 CAL) 1,000 mL (04/16/19 1209)  . fentaNYL infusion INTRAVENOUS 100 mcg/hr (04/16/19 0800)  . piperacillin-tazobactam (ZOSYN)  IV Stopped (04/16/19 1006)   PRN Meds: acetaminophen **OR** acetaminophen, fentaNYL (SUBLIMAZE) injection, fentaNYL (SUBLIMAZE) injection, midazolam, ondansetron **OR** ondansetron (ZOFRAN) IV, sodium chloride flush   Vital Signs   Vitals:   04/16/19 0802 04/16/19 1000 04/16/19 1104 04/16/19 1213  BP: 131/88 127/83 (!) 151/78   Pulse: (!) 101 (!) 50 74   Resp: (!) 28 (!) 28 (!) 28   Temp:    98.4 F (36.9 C)  TempSrc:    Oral  SpO2: 97% 95% 96%    Weight:      Height:        Intake/Output Summary (Last 24 hours) at 04/16/2019 1332 Last data filed at 04/16/2019 0800 Gross per 24 hour  Intake 1909.28 ml  Output 1000 ml  Net 909.28 ml   Last 3 Weights 04/13/2019 03/31/2019 10/30/2018  Weight (lbs) 177 lb 11.1 oz 180 lb 8.9 oz 186 lb  Weight (kg) 80.6 kg 81.9 kg 84.369 kg      Telemetry  Overnight telemetry shows frequent episodes of SVT with heart rate around 200, there also is frequent sinus rhythm with intermittent sinus bradycardia.  He appears has episodes of PACs but are also not conducted QRS remains narrow no evidence of high-grade AV block, which I personally reviewed.   Physical Exam   Vitals:   04/16/19 0802 04/16/19 1000 04/16/19 1104 04/16/19 1213  BP: 131/88 127/83 (!) 151/78   Pulse: (!) 101 (!) 50 74   Resp: (!) 28 (!) 28 (!) 28   Temp:    98.4 F (36.9 C)  TempSrc:    Oral  SpO2: 97% 95% 96%   Weight:      Height:         Intake/Output Summary (Last 24 hours) at 04/16/2019 1332 Last data filed at 04/16/2019 0800 Gross per 24 hour  Intake 1909.28 ml  Output 1000 ml  Net 909.28 ml  Last 3 Weights 04/13/2019 03/20/2019 10/30/2018  Weight (lbs) 177 lb 11.1 oz 180 lb 8.9 oz 186 lb  Weight (kg) 80.6 kg 81.9 kg 84.369 kg    Body mass index is 23.44 kg/m.  General: Intubated on the vent, awake and follows commands Head: Atraumatic, normal size  Eyes: PEERLA, EOMI  Neck: Supple, no JVD Endocrine: No thryomegaly Cardiac: Normal S1, S2; RRR; no murmurs, rubs, or gallops Lungs: Diminished breath sounds on left side Abd: Soft, nontender, no hepatomegaly  Ext: Trace edema with venous insufficiency changes Musculoskeletal: No deformities, BUE and BLE strength normal and equal Skin: Warm and dry, no rashes   Neuro: Intubated, awake, follows commands  Labs  High Sensitivity Troponin:   Recent Labs  Lab 04/14/19 2024  TROPONINIHS 21*     Cardiac EnzymesNo results for input(s): TROPONINI in the last  168 hours. No results for input(s): TROPIPOC in the last 168 hours.  Chemistry Recent Labs  Lab 04/09/2019 1350 04/13/19 0129  04/15/19 0325  04/15/19 1901 04/15/19 2304 04/16/19 0843  NA 143 140   < > 145  --   --  147* 145  K 3.7 4.4   < > 3.3*  --   --  3.7 3.9  CL 104 104   < > 109  --   --  112* 112*  CO2 29 24   < > 28  --   --  23 20*  GLUCOSE 104* 167*   < > 84  --   --  104* 136*  BUN 20 18   < > 21  --   --  25* 26*  CREATININE 1.02 1.07   < > 1.05  --   --  1.02 1.08  CALCIUM 14.1* 12.3*   < > 9.5  9.6   < > 9.2 9.2 9.0  9.0  PROT 7.5 6.9  --   --   --   --   --   --   ALBUMIN 3.6 3.2*  --   --   --   --   --   --   AST 35 43*  --   --   --   --   --   --   ALT 16 13  --   --   --   --   --   --   ALKPHOS 81 68  --   --   --   --   --   --   BILITOT 1.1 1.5*  --   --   --   --   --   --   GFRNONAA >60 >60   < > >60  --   --  >60 >60  GFRAA >60 >60   < > >60  --   --  >60 >60  ANIONGAP 10 12   < > 8  --   --  12 13   < > = values in this interval not displayed.    Hematology Recent Labs  Lab 04/13/19 0129 04/14/19 0023 04/16/19 0843  WBC 7.0 13.3* 11.2*  RBC 4.27 3.88* 4.20*  HGB 15.7 14.3 15.2  HCT 47.8 44.4 46.6  MCV 111.9* 114.4* 111.0*  MCH 36.8* 36.9* 36.2*  MCHC 32.8 32.2 32.6  RDW 12.5 12.5 12.2  PLT 146* 156 100*   BNP Recent Labs  Lab 04/02/2019 1458  BNP 84.3    DDimer No results for input(s): DDIMER in the last 168 hours.   Radiology  Dg Abd 1 View  Result Date: 04/15/2019 CLINICAL DATA:  Status post OG tube placement. EXAM: ABDOMEN - 1 VIEW COMPARISON:  None. FINDINGS: OG tube is in place with the tip in the distal stomach. IMPRESSION: As above. Electronically Signed   By: Inge Rise M.D.   On: 04/15/2019 15:51   Dg Chest Port 1 View  Result Date: 04/15/2019 CLINICAL DATA:  Status post ET tube placement. EXAM: PORTABLE CHEST 1 VIEW COMPARISON:  Chest radiograph 04/15/2019 FINDINGS: Monitoring leads overlie the patient. ET tube  mid trachea. Enteric tube courses inferior to the diaphragm. Persistent opacification of the left hemithorax. Minimal right basilar atelectasis. Thoracic spine degenerative changes. IMPRESSION: ET tube mid trachea. Persistent opacification of the left hemithorax. Electronically Signed   By: Lovey Newcomer M.D.   On: 04/15/2019 15:30   Dg Chest Port 1 View  Result Date: 04/15/2019 CLINICAL DATA:  Dyspnea EXAM: PORTABLE CHEST 1 VIEW COMPARISON:  Chest x-rays dated 04/14/2019 04/15/2019. FINDINGS: Continued complete opacification of the LEFT hemithorax. RIGHT lung remains clear. IMPRESSION: 1. Continued complete opacification of the LEFT hemithorax, compatible with previous description of a known malignancy and associated postobstructive atelectasis. 2. RIGHT lung remains clear. Electronically Signed   By: Franki Cabot M.D.   On: 04/15/2019 08:52   Dg Chest Port 1 View  Result Date: 04/14/2019 CLINICAL DATA:  Shortness of breath EXAM: PORTABLE CHEST 1 VIEW COMPARISON:  Chest radiograph 04/08/2019 FINDINGS: Monitoring leads overlie the patient. Interval worsening complete opacification left hemithorax. Minimal right basilar atelectasis. Thoracic spine degenerative changes. IMPRESSION: Interval worsening of opacification of the left hemithorax most compatible with known malignancy and associated postobstructive atelectasis. Electronically Signed   By: Lovey Newcomer M.D.   On: 04/14/2019 17:39   [place] Ekg Site Rite  Result Date: 04/16/2019 If Site Rite image not attached, placement could not be confirmed due to current cardiac rhythm.   Cardiac Studies  TTE 04/15/2019  1. Left ventricular ejection fraction, by visual estimation, is 60 to 65%. The left ventricle has normal function. Left ventricular septal wall thickness was mildly increased. Mildly increased left ventricular posterior wall thickness. There is mildly  increased left ventricular hypertrophy.  2. Patient was extremely tachycardic ranging from  100 to 200 bpm. Left ventricular function appears to be normal.  3. Global right ventricle has normal systolic function.The right ventricular size is normal. No increase in right ventricular wall thickness.  4. Left atrial size was mildly dilated.  5. Right atrial size was severely dilated.  6. The mitral valve is normal in structure. No evidence of mitral valve regurgitation. No evidence of mitral stenosis.  7. The tricuspid valve is normal in structure. Tricuspid valve regurgitation is not demonstrated.  8. The aortic valve is normal in structure. Aortic valve regurgitation is not visualized. No evidence of aortic valve sclerosis or stenosis.  9. The pulmonic valve was normal in structure. Pulmonic valve regurgitation is not visualized. 10. TR signal is inadequate for assessing pulmonary artery systolic pressure. 11. The inferior vena cava is dilated in size with <50% respiratory variability, suggesting right atrial pressure of 15 mmHg.  Patient Profile  Omar Wagner is a 63 y.o. male with severe COPD, venous insufficiency, prior drug abuse who was admitted for left lung mass with obstructive pneumonia.  Cardiology consulted for frequent SVT episodes as well as intermittent bradycardia.  Assessment & Plan  1. Supraventricular tachycardia: Unclear if this actually is atrial fibrillation.  On my review the rhythm is regular and  fast.  He does have episodes of sinus rhythm with frequent ectopic atrial beats.  This could represent an atrial tachycardia.  This also could just represent an AV nodal reentrant tachycardia.  His episodes of bradycardia appear to be intermittent and when he is not in this rhythm.  It also appears to happen more frequently after cessation or termination of the rhythm.  For now would recommend to discontinue IV amiodarone therapy and to treat his obstruction aggressively with radiation therapy as is being done by the primary team.  He also will continue antibiotics for now.   Would hold anticoagulation at this time.  No definitive indication for pacing at this time.  We will continue to monitor him for now.  We will also discuss his strips with electrophysiology at [facility].  Would also recommend to aggressively treat his underlying acidosis and pneumonia.   For questions or updates, please contact [place] Please consult [email or web address] for contact info under   Signed, Lake Bells T. Audie Box, [place]  04/16/2019 1:32 PM

## 2019-04-16 NOTE — Consult Note (Signed)
[place] Nurse Consult Note: Reason for Consult: LEs Patient sedated and intubated. Bedside nurse concerned about condition of LEs Wound type: no open wounds, venous dermatitis, scaling skin  Pressure Injury POA: NA Measurement: NA Wound bed:NA Drainage (amount, consistency, odor) NA Periwound: NA Dressing procedure/placement/frequency: Add Eucerin to bilateral LEs, not between toes Add Prevalon boots bilaterally for vunerable heels.   Discussed POC with patient and bedside nurse.  Re consult if needed, will not follow at this time. Thanks  Isabellah Sobocinski [employer], RN,CWOCN, CNS, [place] [phone])

## 2019-04-16 NOTE — Progress Notes (Signed)
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 We are hoping to proceed with simulation and treatment today but his simulation was postponed this am hopefully to be completed later today and subsequent treatment today as he was not stable to come downstairs to our department.     [name], PAC

## 2019-04-16 NOTE — Progress Notes (Signed)
eLink Physician-Brief Progress Note Patient Name: Omar Wagner DOB: 1955-10-15 MRN: [id number]   Date of Service  04/16/2019  HPI/Events of Note  AFIB with RVR - Ventricular rate = 192. K+ and Mg++ being replaced.   eICU Interventions  Will order: 1. Continue Amiodarone IV infusion.  2. Amiodarone 150 mg IV over 10 minutes now.  3. Further management per Cardiology consultant.      Intervention Category Major Interventions: Arrhythmia - evaluation and management  Sommer,Steven Eugene 04/16/2019, 6:35 AM

## 2019-04-16 NOTE — Progress Notes (Signed)
[place] Progress Note Patient Name: Omar Wagner DOB: Mar 20, 1956 MRN: [id number]   Date of Service  04/16/2019  HPI/Events of Note  Mg++ = 1.8, K+ = 3.7 and Creatinine = 1.02.   eICU Interventions  Will replace K+ and Mg++.      Intervention Category Major Interventions: Electrolyte abnormality - evaluation and management  Robey Massmann Eugene 04/16/2019, 5:38 AM

## 2019-04-17 ENCOUNTER — Inpatient Hospital Stay (HOSPITAL_COMMUNITY): Payer: Medicare Other

## 2019-04-17 ENCOUNTER — Ambulatory Visit
Admission: RE | Admit: 2019-04-17 | Discharge: 2019-04-17 | Disposition: A | Discharge status: Home / Self Care | Payer: Medicare Other | Source: Ambulatory Visit | Attending: Radiation Oncology | Admitting: Radiation Oncology

## 2019-04-17 DIAGNOSIS — J9811 Atelectasis: Secondary | ICD-10-CM | POA: Diagnosis not present

## 2019-04-17 DIAGNOSIS — J449 Chronic obstructive pulmonary disease, unspecified: Secondary | ICD-10-CM | POA: Diagnosis not present

## 2019-04-17 DIAGNOSIS — J9601 Acute respiratory failure with hypoxia: Secondary | ICD-10-CM | POA: Diagnosis not present

## 2019-04-17 DIAGNOSIS — R918 Other nonspecific abnormal finding of lung field: Secondary | ICD-10-CM | POA: Diagnosis not present

## 2019-04-17 LAB — BASIC METABOLIC PANEL
Anion gap: 12 (ref 5–15)
BUN: 27 mg/dL — ABNORMAL HIGH (ref 8–23)
CO2: 22 mmol/L (ref 22–32)
Calcium: 7.3 mg/dL — ABNORMAL LOW (ref 8.9–10.3)
Chloride: 104 mmol/L (ref 98–111)
Creatinine, Ser: 0.9 mg/dL (ref 0.61–1.24)
GFR calc Af Amer: >60 mL/min (ref >60)
GFR calc non Af Amer: >60 mL/min (ref >60)
Glucose, Bld: 388 mg/dL — ABNORMAL HIGH (ref 70–99)
Potassium: 7.2 mmol/L (ref 3.5–5.1)
Sodium: 138 mmol/L (ref 135–145)

## 2019-04-17 LAB — COMPREHENSIVE METABOLIC PANEL
ALT: 14 U/L (ref 0–44)
AST: 23 U/L (ref 15–41)
Albumin: 2.4 g/dL — ABNORMAL LOW (ref 3.5–5.0)
Alkaline Phosphatase: 42 U/L (ref 38–126)
Anion gap: 8 (ref 5–15)
BUN: 28 mg/dL — ABNORMAL HIGH (ref 8–23)
CO2: 23 mmol/L (ref 22–32)
Calcium: 7.7 mg/dL — ABNORMAL LOW (ref 8.9–10.3)
Chloride: 109 mmol/L (ref 98–111)
Creatinine, Ser: 0.95 mg/dL (ref 0.61–1.24)
GFR calc Af Amer: >60 mL/min (ref >60)
GFR calc non Af Amer: >60 mL/min (ref >60)
Glucose, Bld: 308 mg/dL — ABNORMAL HIGH (ref 70–99)
Potassium: 5.1 mmol/L (ref 3.5–5.1)
Sodium: 140 mmol/L (ref 135–145)
Total Bilirubin: 0.7 mg/dL (ref 0.3–1.2)
Total Protein: 5.1 g/dL — ABNORMAL LOW (ref 6.5–8.1)

## 2019-04-17 LAB — MAGNESIUM
Magnesium: 1.6 mg/dL — ABNORMAL LOW (ref 1.7–2.4)
Magnesium: 1.4 mg/dL — ABNORMAL LOW (ref 1.7–2.4)

## 2019-04-17 LAB — CULTURE, BLOOD (ROUTINE X 2)
Culture: NO GROWTH
Culture: NO GROWTH
Special Requests: ADEQUATE
Special Requests: ADEQUATE

## 2019-04-17 LAB — GLUCOSE, CAPILLARY
Glucose-Capillary: 179 mg/dL — ABNORMAL HIGH (ref 70–99)
Glucose-Capillary: 248 mg/dL — ABNORMAL HIGH (ref 70–99)
Glucose-Capillary: 124 mg/dL — ABNORMAL HIGH (ref 70–99)
Glucose-Capillary: 186 mg/dL — ABNORMAL HIGH (ref 70–99)
Glucose-Capillary: 143 mg/dL — ABNORMAL HIGH (ref 70–99)
Glucose-Capillary: 119 mg/dL — ABNORMAL HIGH (ref 70–99)

## 2019-04-17 LAB — PHOSPHORUS
Phosphorus: 6.3 mg/dL — ABNORMAL HIGH (ref 2.5–4.6)
Phosphorus: 10.1 mg/dL — ABNORMAL HIGH (ref 2.5–4.6)

## 2019-04-17 LAB — HEMOGLOBIN A1C
Hgb A1c MFr Bld: 5.9 % — ABNORMAL HIGH (ref 4.8–5.6)
Mean Plasma Glucose: 122.63 mg/dL

## 2019-04-17 LAB — SURGICAL PATHOLOGY

## 2019-04-17 LAB — POTASSIUM: Potassium: 3.3 mmol/L — ABNORMAL LOW (ref 3.5–5.1)

## 2019-04-17 MED ORDER — GADOBUTROL 1 MMOL/ML IV SOLN
8.0000 mL | Freq: Once | INTRAVENOUS | Status: AC | PRN
  Administered 2019-04-17: 8 mL via INTRAVENOUS

## 2019-04-17 MED ORDER — PANTOPRAZOLE SODIUM 40 MG PO PACK
40.0000 mg | PACK | Freq: Every day | ORAL | Status: DC
  Administered 2019-04-17 – 2019-04-19 (×3): 40 mg
  Filled 2019-04-17 (×3): qty 20

## 2019-04-17 MED ORDER — PREDNISONE 5 MG/ML PO CONC
30.0000 mg | Freq: Every day | ORAL | Status: AC
  Administered 2019-04-18 – 2019-04-19 (×2): 30 mg
  Filled 2019-04-17 (×3): qty 6

## 2019-04-17 MED ORDER — POTASSIUM CHLORIDE 20 MEQ/15ML (10%) PO SOLN
40.0000 meq | Freq: Once | ORAL | Status: AC
  Administered 2019-04-17: 40 meq
  Filled 2019-04-17: qty 30

## 2019-04-17 MED ORDER — FREE WATER
200.0000 mL | Freq: Four times a day (QID) | Status: DC
  Administered 2019-04-17 – 2019-04-19 (×10): 200 mL

## 2019-04-17 MED ORDER — POTASSIUM PHOSPHATES 15 MMOLE/5ML IV SOLN
30.0000 mmol | Freq: Once | INTRAVENOUS | Status: AC
  Administered 2019-04-17: 30 mmol via INTRAVENOUS
  Filled 2019-04-17: qty 10

## 2019-04-17 MED ORDER — INSULIN ASPART 100 UNIT/ML ~~LOC~~ SOLN
0.0000 [IU] | SUBCUTANEOUS | Status: DC
  Administered 2019-04-17: 1 [IU] via SUBCUTANEOUS
  Administered 2019-04-17: 2 [IU] via SUBCUTANEOUS
  Administered 2019-04-17: 1 [IU] via SUBCUTANEOUS
  Administered 2019-04-18: 2 [IU] via SUBCUTANEOUS
  Administered 2019-04-18 (×3): 1 [IU] via SUBCUTANEOUS
  Administered 2019-04-19: 2 [IU] via SUBCUTANEOUS
  Administered 2019-04-19: 1 [IU] via SUBCUTANEOUS
  Administered 2019-04-19: 2 [IU] via SUBCUTANEOUS
  Administered 2019-04-19: 1 [IU] via SUBCUTANEOUS
  Administered 2019-04-20 – 2019-04-21 (×7): 2 [IU] via SUBCUTANEOUS
  Administered 2019-04-22 (×2): 1 [IU] via SUBCUTANEOUS
  Administered 2019-04-22 – 2019-04-23 (×2): 2 [IU] via SUBCUTANEOUS
  Administered 2019-04-23: 1 [IU] via SUBCUTANEOUS

## 2019-04-17 NOTE — Progress Notes (Signed)
eLink Physician-Brief Progress Note Patient Name: Omar Wagner DOB: [birth year] MRN: [id number]   Date of Service  04/17/2019  HPI/Events of Note  K+ = 3.3 and Creatinine = 0.90.  eICU Interventions  Will replace K+.     Intervention Category Major Interventions: Electrolyte abnormality - evaluation and management  Destiny Trickey Eugene 04/17/2019, 10:18 PM

## 2019-04-17 NOTE — Progress Notes (Signed)
NAME:  FAITH BRANAN, MRN:  [id number], DOB:  [birth year], LOS: 5 ADMISSION DATE:  04/13/2019, CONSULTATION DATE:  04/13/19 REFERRING MD: Louellen Molder, MD CHIEF COMPLAINT:  Lung mass with mets  Brief History   63 year old male with very severe COPD (FEV1 33%) who presents with acute hypoxemic respiratory failure secondary to post-obstructive pneumonia in setting of LUL compression/collapse from left lung mass .  Past Medical History  Very severe COPD (FEV1 33%) in 2018 Depression Hx basal cell [facility] Events   11/26 Admitted 11/27 CT guided biopsy of soft tissue nodule in the left posterior chest wall 11/28 pulmonary consulted.  Recommended routine bronchodilator therapy antibiotics and prednisone taper 11/29: Episodes of hypertension As well as bradycardia.  Alternating with paroxysmal atrial fibrillation with rapid ventricular response/SVT.  Increasing oxygen requirements noted cardiology consulted.  Started amiodarone, felt not a candidate for pacing.  Developed worsening respiratory failure requiring intubation.  Underwent bronchoscopy at bedside showing extensive endobronchial tumor burden in the left upper lobe with extrinsic compression on the left lower lobe. -Having recurrent bradycardia so amiodarone held later in the evening, 11/30: Went back into SVT early a.m. hours.  Heart rate 198 205.  Amiodarone drip resumed.  Also receiving as needed Lopressor.  Marked for first XRT 12/1 still vent dependent. Having freq atrial arrhythmias but more stable. Weaning.   Consults:  TRH IR PCCM Rad/Onc  Procedures:  PICC 11/30>  Significant Diagnostic Tests:  PET 03/01/19 1. Intensely hypermetabolic (max SUV 14.4) large poorly marginated 8.5 x 4.8 cm posterior left upper lobe lung mass with direct peripheral left chest wall invasion between the lateral left fifth and sixth ribs and direct left paraspinal invasion at the T4 level, significantly increased in  size since 01/08/2019 chest CT, compatible with aggressive locally advanced primary bronchogenic carcinoma. 2. Hypermetabolic ipsilateral mediastinal lymph node metastases in the AP window and left paraesophageal chains. 3. Hypermetabolic distant nodal metastasis in the left external iliac chain. 4. Hypermetabolic posterior left chest wall metastasis between left sixth and seventh ribs. 5. Hypermetabolic osseous metastases to the left sixth rib. 6. PET-CT stage IVB (T4 N2 M1c). 7. New 5 mm right upper lobe pulmonary nodule and slight growth of 4 mm right middle lobe pulmonary nodule, below PET resolution, cannot exclude pulmonary metastases. 8. Aortic Atherosclerosis (ICD10-I70.0) and Emphysema (ICD10-J43.9).  CT Chest W Contrast 03/27/2019 1. New near complete collapse of the left upper lobe felt to be secondary to progression of the patient's known left upper lobe lung mass, compressing the left upper lobe bronchus. 2. Findings consistent with significant interval progression of disease as evidenced by new pathologically enlarged mediastinal lymph nodes as well as multiple new low-attenuation masses in the liver concerning for hepatic metastatic disease. In addition, there are new pulmonary opacities in the right upper and right lower lobes raising concern for additional foci of metastatic disease. 3. Consolidation involving the left lung apex concerning for postobstructive pneumonia. 4. Moderate amount of debris within the trachea and bronchi bilaterally. 5. Significant narrowing of the left lower lobe bronchus. There is consolidation involving the left lower lobe with a small left-sided pleural effusion. 6. New osseous involvement involving the posterior fifth and sixth ribs on the left.  2d ECHO 04/15/19 1. Left ventricular ejection fraction, by visual estimation, is 60 to 65%. The left ventricle has normal function. Left ventricular septal wall thickness was mildly increased.  Mildly increased left ventricular posterior wall thickness. There is mildly  increased left  ventricular hypertrophy.  2. Patient was extremely tachycardic ranging from 100 to 200 bpm. Left ventricular function appears to be normal.  3. Global right ventricle has normal systolic function.The right ventricular size is normal. No increase in right ventricular wall thickness.  4. Left atrial size was mildly dilated.  5. Right atrial size was severely dilated.  6. The mitral valve is normal in structure. No evidence of mitral valve regurgitation. No evidence of mitral stenosis.  7. The tricuspid valve is normal in structure. Tricuspid valve regurgitation is not demonstrated.  8. The aortic valve is normal in structure. Aortic valve regurgitation is not visualized. No evidence of aortic valve sclerosis or stenosis.  9. The pulmonic valve was normal in structure. Pulmonic valve regurgitation is not visualized. 10. TR signal is inadequate for assessing pulmonary artery systolic pressure. 11. The inferior vena cava is dilated in size with <50% respiratory variability, suggesting right atrial pressure of 15 mmHg. Micro Data:  BCx 03/30/2019 >> NGTD Sputum Cx 04/13/19  Antimicrobials:  Vanc 11/26>stopped Cefepime 11/26>stopped Ceftriaxone 11/26>stopped Azithro 11/26>stopped Zosyn 11/26> Interim history/subjective:  Radiation yesterday afternoon. Awake on vent with no complaints.  Objective   Blood pressure (Abnormal) 157/83, pulse (Abnormal) 49, temperature 97.6 F (36.4 C), temperature source Axillary, resp. rate 20, height 6\' 1"  (1.854 m), weight 81.2 kg, SpO2 95 %.    Vent Mode: CPAP;PSV FiO2 (%):  [40 %-50 %] 40 % Set Rate:  [28 bmp] 28 bmp Vt Set:  [470 mL] 470 mL PEEP:  [5 cmH20-8 cmH20] 5 cmH20 Pressure Support:  [5 cmH20] 5 cmH20 Plateau Pressure:  [18 cmH20-22 cmH20] 22 cmH20   Intake/Output Summary (Last 24 hours) at 04/17/2019 0930 Last data filed at 04/17/2019 0913 Gross per 24  hour  Intake 1914.41 ml  Output 835 ml  Net 1079.41 ml   Filed Weights   04/04/2019 2130 04/13/19 0540 04/17/19 0500  Weight: 81.9 kg 80.6 kg 81.2 kg   Physical Exam:  General ill-appearing male awake & calm on vent in no distress HEENT NCAT, ETT in place, mucous membranes pink/moist Pulmonary: Clear bilat, left side very diminished, currently CP/PS .40/5 Cardiac: ST w/ PACs 100's, S1-S2, no MRG Abdomen: Soft, no TTP, +BS, tolerating TF Extremities: Upper ext scattered ecchymosis, edematous.  Lower ext chronic venous dermatitis, edematous.  Distal pulses intact. Neuro: Awake, alert, calm, follows commands, nods answers to questions GU: Clear yellow urine  Resolved Hospital Problem list   N/A  Assessment & Plan:  63 y/o male with severe COPD (FEV1 33%) admitted with COPD exacerbation secondary to postobstructive pneumonia in the setting of left lung mass   Acute hypoxic and hypercarbic respiratory failure in the setting left upper lobe lung mass Small cell lung cancer with associated postobstructive atelectasis/pneumonia, further complicated by COPD exacerbation -Portable chest x-ray personally reviewed: This demonstrated ETT and PICC in satisfactory position with no improvement in complete opacification of the left hemithorax.   Plan Continue full vent support.  Cont prednisone (day 2/5) Continue scheduled BD PAD protocol with RASS goal 0 to -1 Abx day 5/7.   He was marked for radiation yesterday afternoon and is currently receiving first full treatment  He does well on SBT with RSBI of 35 but would like to see a little radiographic improvement before we extubate.   AFRVR, previously bradycardic. -Unclear if AF vs atrial tachycardia vs AV nodal reentrant tachycardia -Continues to have burst of tachycardia with intermittent bradycardia into the 40's, maintains stable BP -IV amiodarone continues -EF  60-65% with no valvular abnormalities Plan Cont tele Cont amiodarone  Not a  candidate for pacemaker per cardiology Keep K > 4 and Mg > 2 No systemic AC as he is high risk for bleeding  Hyperglycemia (? Steroid induced?)  Plan Ck Hgb a1c Start ssi   Venous stasis dermatitis involving bilateral lower extremities Plan Eucerin to bilateral lower extremities per WOC  Fluid and electrolyte imbalance: hypophosphatemia  Plan Replace KPO4 Repeat chemistry this afternoon Cont free water replacement (rate adjusted)  Mild thrombocytopenia Plan Trend CBC  Best practice:  Diet: Tube feeds started 11/30 Pain/Anxiety/Delirium protocol (if indicated): 11/29 VAP protocol (if indicated): 11/29 DVT prophylaxis: LMWH started 11/29 GI prophylaxis: PPI Glucose control: SSI Mobility: Mobility as tolerated Code Status: Full Family Communication: Updated patient on 12/1 Disposition: ICU for VDRF requiring multiple radiation treatments prior to attempting extubation.  My critical care x32 minutes  Erick Colace ACNP-BC [place] Pager # [phone] OR # [phone] if no answer

## 2019-04-17 NOTE — Progress Notes (Signed)
Cardiology Progress Note  Patient ID: Omar Wagner MRN: [id number] DOB: 1955-10-09 Date of Encounter: 04/17/2019  Primary Cardiologist: No primary care provider on file.  Subjective  Review of telemetry demonstrates sinus rhythm with intermittent ectopic atrial beats, aberrantly conducted PACs, and multifocal atrial tachycardia at times.  There is not appear to be any evidence of atrial relation.  He appears to be stable when he does have sinus bradycardia and there is no evidence of hypotension with this.  He is recently started radiation therapy.  He remains intubated but is able to follow commands and nod appropriately during the time my interview.  ROS:  All other ROS reviewed and negative. Pertinent positives noted in the HPI.     Inpatient Medications  Scheduled Meds: . chlorhexidine gluconate (MEDLINE KIT)  15 mL Mouth Rinse BID  . Chlorhexidine Gluconate Cloth  6 each Topical Daily  . enoxaparin (LOVENOX) injection  40 mg Subcutaneous Q24H  . free water  200 mL Per Tube Q6H  . hydrocerin   Topical BID  . insulin aspart  0-9 Units Subcutaneous Q4H  . ipratropium  0.5 mg Nebulization Q6H  . levalbuterol  0.63 mg Nebulization Q6H  . mouth rinse  15 mL Mouth Rinse 10 times per day  . mometasone-formoterol  2 puff Inhalation BID  . montelukast  10 mg Oral QHS  . pantoprazole sodium  40 mg Per Tube Q1200  . [START ON 04/18/2019] predniSONE  30 mg Per Tube Q breakfast  . sodium chloride flush  10-40 mL Intracatheter Q12H   Continuous Infusions: . amiodarone 30 mg/hr (04/17/19 0926)  . amiodarone    . feeding supplement (VITAL AF 1.2 CAL) 70 mL/hr at 04/17/19 0400  . fentaNYL infusion INTRAVENOUS 175 mcg/hr (04/17/19 0925)  . piperacillin-tazobactam (ZOSYN)  IV Stopped (04/17/19 0905)  . potassium PHOSPHATE IVPB (in mmol) 30 mmol (04/17/19 0919)   PRN Meds: acetaminophen **OR** acetaminophen, fentaNYL (SUBLIMAZE) injection, fentaNYL (SUBLIMAZE) injection, gadobutrol, midazolam,  ondansetron **OR** ondansetron (ZOFRAN) IV, sodium chloride flush   Vital Signs   Vitals:   04/17/19 0800 04/17/19 0900 04/17/19 1000 04/17/19 1020  BP: 134/89 (!) 157/83 (!) 145/92 (!) 145/92  Pulse: (!) 49 (!) 49 (!) 48 (!) 101  Resp: 18 20 (!) 28 (!) 30  Temp: 97.6 F (36.4 C)     TempSrc: Axillary     SpO2: 95% 95% 98% 94%  Weight:      Height:        Intake/Output Summary (Last 24 hours) at 04/17/2019 1115 Last data filed at 04/17/2019 0913 Gross per 24 hour  Intake 1914.41 ml  Output 835 ml  Net 1079.41 ml   Last 3 Weights 04/17/2019 04/13/2019 04/02/2019  Weight (lbs) 179 lb 0.2 oz 177 lb 11.1 oz 180 lb 8.9 oz  Weight (kg) 81.2 kg 80.6 kg 81.9 kg      Telemetry  Overnight telemetry shows normal sinus rhythm, intermittent sinus bradycardia, frequent ectopic atrial beats with aberrant conduction, frequent multifocal atrial tachycardia, which I personally reviewed.   Physical Exam   Vitals:   04/17/19 0800 04/17/19 0900 04/17/19 1000 04/17/19 1020  BP: 134/89 (!) 157/83 (!) 145/92 (!) 145/92  Pulse: (!) 49 (!) 49 (!) 48 (!) 101  Resp: 18 20 (!) 28 (!) 30  Temp: 97.6 F (36.4 C)     TempSrc: Axillary     SpO2: 95% 95% 98% 94%  Weight:      Height:  Intake/Output Summary (Last 24 hours) at 04/17/2019 1115 Last data filed at 04/17/2019 0913 Gross per 24 hour  Intake 1914.41 ml  Output 835 ml  Net 1079.41 ml    Last 3 Weights 04/17/2019 04/13/2019 04/15/2019  Weight (lbs) 179 lb 0.2 oz 177 lb 11.1 oz 180 lb 8.9 oz  Weight (kg) 81.2 kg 80.6 kg 81.9 kg    Body mass index is 23.62 kg/m.  General: Intubated on the vent, awake and follows commands Head: Atraumatic, normal size  Eyes: PEERLA, EOMI  Neck: Supple, no JVD Endocrine: No thryomegaly Cardiac: Normal S1, S2; RRR; no murmurs, rubs, or gallops Lungs: Diminished breath sounds bilaterally, more so noted in the left lung fields Abd: Soft, nontender, no hepatomegaly  Ext: No edema, pulses 2+  Musculoskeletal: No deformities, BUE and BLE strength normal and equal Skin: Venous insufficiency changes noted lower extremities Neuro: Alert awake on the ventilator  Labs  High Sensitivity Troponin:   Recent Labs  Lab 04/14/19 2024  TROPONINIHS 21*     Cardiac EnzymesNo results for input(s): TROPONINI in the last 168 hours. No results for input(s): TROPIPOC in the last 168 hours.  Chemistry Recent Labs  Lab 03/29/2019 1350 04/13/19 0129  04/15/19 0325  04/15/19 2304 04/16/19 0843 04/16/19 1722  NA 143 140   < > 145  --  147* 145  --   K 3.7 4.4   < > 3.3*  --  3.7 3.9  --   CL 104 104   < > 109  --  112* 112*  --   CO2 29 24   < > 28  --  23 20*  --   GLUCOSE 104* 167*   < > 84  --  104* 136*  --   BUN 20 18   < > 21  --  25* 26*  --   CREATININE 1.02 1.07   < > 1.05  --  1.02 1.08  --   CALCIUM 14.1* 12.3*   < > 9.5  9.6   < > 9.2 9.0  9.0 8.9  PROT 7.5 6.9  --   --   --   --   --   --   ALBUMIN 3.6 3.2*  --   --   --   --   --   --   AST 35 43*  --   --   --   --   --   --   ALT 16 13  --   --   --   --   --   --   ALKPHOS 81 68  --   --   --   --   --   --   BILITOT 1.1 1.5*  --   --   --   --   --   --   GFRNONAA >60 >60   < > >60  --  >60 >60  --   GFRAA >60 >60   < > >60  --  >60 >60  --   ANIONGAP 10 12   < > 8  --  12 13  --    < > = values in this interval not displayed.    Hematology Recent Labs  Lab 04/13/19 0129 04/14/19 0023 04/16/19 0843  WBC 7.0 13.3* 11.2*  RBC 4.27 3.88* 4.20*  HGB 15.7 14.3 15.2  HCT 47.8 44.4 46.6  MCV 111.9* 114.4* 111.0*  MCH 36.8* 36.9* 36.2*  MCHC 32.8 32.2 32.6  RDW 12.5 12.5 12.2  PLT 146* 156 100*   BNP Recent Labs  Lab 03/20/2019 1458  BNP 84.3    DDimer No results for input(s): DDIMER in the last 168 hours.   Radiology  Dg Abd 1 View  Result Date: 04/15/2019 CLINICAL DATA:  Status post OG tube placement. EXAM: ABDOMEN - 1 VIEW COMPARISON:  None. FINDINGS: OG tube is in place with the tip in the distal  stomach. IMPRESSION: As above. Electronically Signed   By: Inge Rise M.D.   On: 04/15/2019 15:51   Dg Chest Port 1 View  Result Date: 04/17/2019 CLINICAL DATA:  Respiratory failure. EXAM: PORTABLE CHEST 1 VIEW COMPARISON:  One-view chest x-ray 04/15/2019. CT chest 04/03/2019 FINDINGS: The heart is obscured. Endotracheal tube is stable. Right-sided PICC line is stable. NG tube courses off the inferior border of the film. Near complete opacification of left hemithorax is stable. The right lung is clear. IMPRESSION: 1. Stable appearance of the chest with near complete opacification of the left hemithorax reflecting known left lung mass and post obstructive disease. 2. The support apparatus is stable. Electronically Signed   By: San Morelle M.D.   On: 04/17/2019 07:09   Dg Chest Port 1 View  Result Date: 04/15/2019 CLINICAL DATA:  Status post ET tube placement. EXAM: PORTABLE CHEST 1 VIEW COMPARISON:  Chest radiograph 04/15/2019 FINDINGS: Monitoring leads overlie the patient. ET tube mid trachea. Enteric tube courses inferior to the diaphragm. Persistent opacification of the left hemithorax. Minimal right basilar atelectasis. Thoracic spine degenerative changes. IMPRESSION: ET tube mid trachea. Persistent opacification of the left hemithorax. Electronically Signed   By: Lovey Newcomer M.D.   On: 04/15/2019 15:30   [place] Ekg Site Rite  Result Date: 04/16/2019 If Site Rite image not attached, placement could not be confirmed due to current cardiac rhythm.   Cardiac Studies  TTE 04/15/2019 1. Left ventricular ejection fraction, by visual estimation, is 60 to 65%. The left ventricle has normal function. Left ventricular septal wall thickness was mildly increased. Mildly increased left ventricular posterior wall thickness. There is mildly  increased left ventricular hypertrophy.  2. Patient was extremely tachycardic ranging from 100 to 200 bpm. Left ventricular function appears to be normal.   3. Global right ventricle has normal systolic function.The right ventricular size is normal. No increase in right ventricular wall thickness.  4. Left atrial size was mildly dilated.  5. Right atrial size was severely dilated.  6. The mitral valve is normal in structure. No evidence of mitral valve regurgitation. No evidence of mitral stenosis.  7. The tricuspid valve is normal in structure. Tricuspid valve regurgitation is not demonstrated.  8. The aortic valve is normal in structure. Aortic valve regurgitation is not visualized. No evidence of aortic valve sclerosis or stenosis.  9. The pulmonic valve was normal in structure. Pulmonic valve regurgitation is not visualized. 10. TR signal is inadequate for assessing pulmonary artery systolic pressure. 11. The inferior vena cava is dilated in size with <50% respiratory variability, suggesting right atrial pressure of 15 mmHg.  Patient Profile  Omar Wagner is a 63 y.o. male with severe COPD, venous insufficiency, prior drug abuse who was admitted for left lung mass with obstructive pneumonia.  Cardiology consulted for frequent SVT episodes as well as intermittent bradycardia.  Assessment & Plan  1.  Paroxysmal supraventricular tachycardia, likely multifocal atrial tachycardia with intermittent atrial tachycardia: Review of his recent telemetry demonstrates sinus rhythm with sinus bradycardia, frequent ectopic atrial rhythm  with apparently conducted PACs.  He also has what appears to be frequent multifocal atrial tachycardia.  He had no further recurrence of the accelerated rhythms with rates into the 190s.  Her review of his strips yesterday, this is likely an atrial tachycardia that is occurring intermittently.  He has frequent ectopic atrial beats, likely a wandering atrial pacemaker at times and multifocal atrial tachycardia but no documented evidence of atrial fibrillation I can tell.  I think for now to control his fast heart rhythms it would be  better to stay on amiodarone for now.  Would recommend his daily IV therapy while intubated and his pneumonia is being treated.  Overall these rhythms are related to his underlying lung disease which are commonly seen in patients like this.  Hopefully this will get better with treatment of his pneumonia as well as radiation therapy to the lung mass.  We can plan to switch him to oral therapy such as a beta-blocker or even oral amiodarone once he is extubated.  But for now, while he is intubated would remain on IV amiodarone therapy.  No definitive indication for anticoagulation per my review.  Cardiology will follow along.   For questions or updates, please contact [place] Please consult [email or web address] for contact info under        Signed, [place] B[place] T. Audie Box, [place]  04/17/2019 11:15 AM

## 2019-04-17 NOTE — Progress Notes (Signed)
Patient had a critical value of Potassium: 7.2. E-link nurse consulted. Repeat potassium pulled and resulted at 3.3. E-link notified. Will continue to monitor.

## 2019-04-17 NOTE — TOC Initial Note (Signed)
Transition of Care [facility]) - Initial/Assessment Note    Patient Details  Name: Omar Wagner MRN: [id number] Date of Birth: [birth year]  [facility] CM/SW Contact:    Nila Nephew, LCSW Phone Number: [phone] 04/17/2019, 2:29 PM  Clinical Narrative:       Completed high readmission risk screening due to score 34%.          Pt admitted from home where he resides with partner. Admitted for respiratory failure- secondary to post-obstructive pneumonia. Pt with lung mass- small cell lung ca. Plan to receive radiation. Pt intubated- spoke with pt's decision-maker [name] [phone] briefly to introduce [facility] team involvement and to clarify insurance coverage listed as Medicaid- [name] reports pt also has Medicare and planning to bring his Medicare card to have on file as well. TOC team will follow for disposition needs.    Activities of Daily Living Home Assistive Devices/Equipment: None ADL Screening (condition at time of admission) [name] cognitive ability adequate to safely complete daily activities?: Yes Is the patient deaf or have difficulty hearing?: No Does the patient have difficulty seeing, even when wearing glasses/contacts?: No Does the patient have difficulty concentrating, remembering, or making decisions?: No Patient able to express need for assistance with ADLs?: No Does the patient have difficulty dressing or bathing?: Yes Independently performs ADLs?: No Communication: Independent Dressing (OT): Needs assistance Is this a change from baseline?: Pre-admission baseline Grooming: Needs assistance Is this a change from baseline?: Pre-admission baseline Does the patient have difficulty walking or climbing stairs?: Yes Weakness of Legs: Both Weakness of Arms/Hands: Both   Admission diagnosis:  Post-obstructive pneumonia due to foreign body aspiration [J18.9, T17.908S] Mass of left lung [R91.8] Patient Active Problem List   Diagnosis Date Noted  .  Malignant neoplasm of upper lobe of left lung ([place]) 04/16/2019  . Lung collapse   . Acute respiratory failure ([place]) 04/15/2019  . Postobstructive pneumonia 04/05/2019  . Lung mass 04/10/2019  . Multiple open wounds of lower leg 04/11/2019  . Hypercalcemia 03/18/2019  . Pulmonary nodule, left 10/31/2018  . Cigarette smoker 05/10/2018  . Bilateral lower extremity edema 12/07/2017  . Elevated blood pressure reading 12/07/2017  . History of elevated blood pressure while in hospital 12/07/2017  . COPD GOLD III  08/02/2016  . Cellulitis and abscess of leg 08/10/2014  . Bacteremia 01/17/2013  . Heroin withdrawal ([place]) 01/14/2013  . Acute diarrhea 01/13/2013  . Hypokalemia 01/13/2013  . Aspiration into airway 01/12/2013  . Substance abuse ([place]) 01/12/2013  . Acute encephalopathy 01/11/2013  . Cellulitis of multiple sites of lower extremity 01/11/2013   PCP:  Marliss Coots, NP Pharmacy:   [facility] [address], [place] - [address] [place] [place] [address] Phone: [phone] Fax: [place], [place] [address] [place] [address] Phone: [phone] Fax: [place], [place] [address] Phone: [phone] Fax: [phone]     Social Determinants of Health (SDOH) Interventions    Readmission Risk Interventions Readmission Risk Prevention Plan 04/17/2019  Transportation Screening Complete  Medication Review (RN Care Manager) Referral to Pharmacy  PCP or Specialist appointment within 3-5 days of discharge Not Complete  PCP/Specialist Appt Not Complete comments DC date unknown- pt established with providers  [place] or [place] Not Complete  HRI or Home Care Consult Pt Refusal Comments  home care needs not known yet- pending clinical course  SW Recovery  Care/Counseling Consult Not Complete  SW Consult Not Complete Comments recovery care needs not yet known- pending clinical course  Palliative Care Screening Not Complete  Comments pending need  Skilled Nursing Facility Not Applicable  Some recent data might be hidden

## 2019-04-17 NOTE — Progress Notes (Signed)
Orthopedic Tech Progress Note Patient Details:  Omar Wagner 1956-01-19 [id number] Pt did not need unna boots said by RN.         Ladell Pier [facility] 04/17/2019, 5:50 PM

## 2019-04-17 NOTE — Progress Notes (Signed)
I called Omar Wagner, the patient's surrogate decision maker about the results of his MRI of the brain. Given that he is still intubated and the lesions are small, we will hold off until he is extubated to offer simulation. He would need a face mask made to immobilize his head for treatment. [name] asked that we call his sister as well since she is also now more actively involved in his care.      [name], PAC

## 2019-04-17 DEATH — deceased

## 2019-04-18 ENCOUNTER — Encounter: Payer: Self-pay | Admitting: Radiation Oncology

## 2019-04-18 ENCOUNTER — Ambulatory Visit
Admit: 2019-04-18 | Discharge: 2019-04-18 | Disposition: A | Discharge status: Home / Self Care | Payer: Medicare Other | Attending: Radiation Oncology | Admitting: Radiation Oncology

## 2019-04-18 ENCOUNTER — Inpatient Hospital Stay (HOSPITAL_COMMUNITY): Payer: Medicare Other

## 2019-04-18 LAB — BASIC METABOLIC PANEL
Anion gap: 9 (ref 5–15)
BUN: 30 mg/dL — ABNORMAL HIGH (ref 8–23)
CO2: 23 mmol/L (ref 22–32)
Calcium: 7.4 mg/dL — ABNORMAL LOW (ref 8.9–10.3)
Chloride: 112 mmol/L — ABNORMAL HIGH (ref 98–111)
Creatinine, Ser: 0.77 mg/dL (ref 0.61–1.24)
GFR calc Af Amer: >60 mL/min (ref >60)
GFR calc non Af Amer: >60 mL/min (ref >60)
Glucose, Bld: 164 mg/dL — ABNORMAL HIGH (ref 70–99)
Potassium: 3.2 mmol/L — ABNORMAL LOW (ref 3.5–5.1)
Sodium: 144 mmol/L (ref 135–145)

## 2019-04-18 LAB — CALCIUM
Calcium: 7.6 mg/dL — ABNORMAL LOW (ref 8.9–10.3)
Calcium: 7.5 mg/dL — ABNORMAL LOW (ref 8.9–10.3)
Calcium: 7.7 mg/dL — ABNORMAL LOW (ref 8.9–10.3)

## 2019-04-18 LAB — GLUCOSE, CAPILLARY
Glucose-Capillary: 112 mg/dL — ABNORMAL HIGH (ref 70–99)
Glucose-Capillary: 131 mg/dL — ABNORMAL HIGH (ref 70–99)
Glucose-Capillary: 68 mg/dL — ABNORMAL LOW (ref 70–99)
Glucose-Capillary: 134 mg/dL — ABNORMAL HIGH (ref 70–99)
Glucose-Capillary: 162 mg/dL — ABNORMAL HIGH (ref 70–99)
Glucose-Capillary: 126 mg/dL — ABNORMAL HIGH (ref 70–99)
Glucose-Capillary: 93 mg/dL (ref 70–99)

## 2019-04-18 LAB — PHOSPHORUS: Phosphorus: 1.3 mg/dL — ABNORMAL LOW (ref 2.5–4.6)

## 2019-04-18 LAB — T3: T3, Total: 59 ng/dL — ABNORMAL LOW (ref 71–180)

## 2019-04-18 MED ORDER — ALTEPLASE 2 MG IJ SOLR
2.0000 mg | Freq: Once | INTRAMUSCULAR | Status: AC
  Administered 2019-04-18: 2 mg

## 2019-04-18 MED ORDER — SODIUM PHOSPHATES 45 MMOLE/15ML IV SOLN
30.0000 mmol | Freq: Once | INTRAVENOUS | Status: AC
  Administered 2019-04-18: 12:00:00 30 mmol via INTRAVENOUS
  Filled 2019-04-18: qty 10

## 2019-04-18 MED ORDER — STERILE WATER FOR INJECTION IJ SOLN
INTRAMUSCULAR | Status: AC
  Filled 2019-04-18: qty 10

## 2019-04-18 NOTE — Progress Notes (Signed)
I was able to connect with Homero Fellers, the patient's sister who lives in [place].  She states that she is involved in [place] life but because of the proximity, they do not see each other often.  She is in full support of continuing to be involved along with Stormy Card.  They have been discussing his situation on a daily basis and are in agreement with all that is being done thus far.  I got [name] up to speed on the current situation the role of radiotherapy and the recommendations to have a medical oncology involvement once he has been extubated to consider chemotherapy.  In the meantime I let her know that we would formally consult with med onc. We also discussed the findings in the brain, the role for whole brain radiotherapy but that we would hold off until he is extubated to further discuss this and make plans for whole brain radiation.  We will continue with his daily radiotherapy sessions, and keep both Stormy Card and and more informed of any changes in his treatment.    [name], PAC

## 2019-04-18 NOTE — Progress Notes (Signed)
OT Cancellation Note  Patient Details Name: Omar Wagner MRN: [id number] DOB: [birth year]   Cancelled Treatment:    Reason Eval/Treat Not Completed: Medical issues which prohibited therapy:  vent  [name] 04/18/2019, 7:45 AM  Lesle Chris, OTR/L Acute Rehabilitation Services [phone] [place] pager [phone] office 04/18/2019

## 2019-04-18 NOTE — Progress Notes (Signed)
Progress Note  Patient Name: Omar Wagner Date of Encounter: 04/18/2019  Primary Cardiologist: New to [place] intubated. Frustrated with not being able to talk. No new complaints this morning. No chest pain or palpitations.   Inpatient Medications    Scheduled Meds:  chlorhexidine gluconate (MEDLINE KIT)  15 mL Mouth Rinse BID   Chlorhexidine Gluconate Cloth  6 each Topical Daily   enoxaparin (LOVENOX) injection  40 mg Subcutaneous Q24H   free water  200 mL Per Tube Q6H   hydrocerin   Topical BID   insulin aspart  0-9 Units Subcutaneous Q4H   ipratropium  0.5 mg Nebulization Q6H   levalbuterol  0.63 mg Nebulization Q6H   mouth rinse  15 mL Mouth Rinse 10 times per day   mometasone-formoterol  2 puff Inhalation BID   montelukast  10 mg Oral QHS   pantoprazole sodium  40 mg Per Tube Q1200   predniSONE  30 mg Per Tube Q breakfast   sodium chloride flush  10-40 mL Intracatheter Q12H   Continuous Infusions:  amiodarone 30 mg/hr (04/18/19 0700)   amiodarone     feeding supplement (VITAL AF 1.2 CAL) 70 mL/hr at 04/18/19 0700   fentaNYL infusion INTRAVENOUS 200 mcg/hr (04/18/19 0700)   piperacillin-tazobactam (ZOSYN)  IV 12.5 mL/hr at 04/18/19 0700   PRN Meds: acetaminophen **OR** acetaminophen, fentaNYL (SUBLIMAZE) injection, fentaNYL (SUBLIMAZE) injection, midazolam, ondansetron **OR** ondansetron (ZOFRAN) IV, sodium chloride flush   Vital Signs    Vitals:   04/18/19 0800 04/18/19 0801 04/18/19 0809 04/18/19 0811  BP: 131/67     Pulse: (!) 43     Resp: (!) 28     Temp:      TempSrc:      SpO2: 98% 98% 98% 99%  Weight:      Height:        Intake/Output Summary (Last 24 hours) at 04/18/2019 0831 Last data filed at 04/18/2019 0700 Gross per 24 hour  Intake 2995.06 ml  Output 1050 ml  Net 1945.06 ml   Filed Weights   04/13/19 0540 04/17/19 0500 04/18/19 0409  Weight: 80.6 kg 81.2 kg 81.2 kg    Telemetry      Sinus rhythm with frequent atrial ectopy; atrial bigeminy, PACs, ectopic atrial beats with aberrant conduction; no significant bradycardic or tachycardic events overnight - Personally Reviewed  ECG    No new tracings.  - Personally Reviewed  Physical Exam   GEN: Ill appearing gentleman; intubated; laying in bed in no acute distress.   Neck: No JVD, no carotid bruits Cardiac: RRR, no murmurs, rubs, or gallops.  Respiratory: ventilated, diminished breaths sounds L>R GI: NABS, Soft, nontender, non-distended  MS: No edema; No deformity.  Skin: Chronic venous stasis skin changes to b/l LE Neuro:  Nonfocal, moving all extremities spontaneously  Labs    Chemistry Recent Labs  Lab 03/27/2019 1350 04/13/19 0129  04/16/19 0843  04/17/19 1049 04/17/19 1800 04/17/19 1944 04/18/19 0157 04/18/19 0755  NA 143 140   < > 145  --  140 138  --   --   --   K 3.7 4.4   < > 3.9  --  5.1 7.2* 3.3*  --   --   CL 104 104   < > 112*  --  109 104  --   --   --   CO2 29 24   < > 20*  --  23 22  --   --   --  GLUCOSE 104* 167*   < > 136*  --  308* 388*  --   --   --   BUN 20 18   < > 26*  --  28* 27*  --   --   --   CREATININE 1.02 1.07   < > 1.08  --  0.95 0.90  --   --   --   CALCIUM 14.1* 12.3*   < > 9.0   9.0   < > 7.7* 7.3*  --  7.6* 7.5*  PROT 7.5 6.9  --   --   --  5.1*  --   --   --   --   ALBUMIN 3.6 3.2*  --   --   --  2.4*  --   --   --   --   AST 35 43*  --   --   --  23  --   --   --   --   ALT 16 13  --   --   --  14  --   --   --   --   ALKPHOS 81 68  --   --   --  42  --   --   --   --   BILITOT 1.1 1.5*  --   --   --  0.7  --   --   --   --   GFRNONAA >60 >60   < > >60  --  >60 >60  --   --   --   GFRAA >60 >60   < > >60  --  >60 >60  --   --   --   ANIONGAP 10 12   < > 13  --  8 12  --   --   --    < > = values in this interval not displayed.     Hematology Recent Labs  Lab 04/13/19 0129 04/14/19 0023 04/16/19 0843  WBC 7.0 13.3* 11.2*  RBC 4.27 3.88* 4.20*  HGB 15.7  14.3 15.2  HCT 47.8 44.4 46.6  MCV 111.9* 114.4* 111.0*  MCH 36.8* 36.9* 36.2*  MCHC 32.8 32.2 32.6  RDW 12.5 12.5 12.2  PLT 146* 156 100*    Cardiac EnzymesNo results for input(s): TROPONINI in the last 168 hours. No results for input(s): TROPIPOC in the last 168 hours.   BNP Recent Labs  Lab 04/13/2019 1458  BNP 84.3     DDimer No results for input(s): DDIMER in the last 168 hours.   Radiology    Mr Jeri Cos Wo Contrast  Result Date: 04/17/2019 CLINICAL DATA:  Lung cancer, staging EXAM: MRI HEAD WITHOUT AND WITH CONTRAST TECHNIQUE: Multiplanar, multiecho pulse sequences of the brain and surrounding structures were obtained without and with intravenous contrast. CONTRAST:  19m GADAVIST GADOBUTROL 1 MMOL/ML IV SOLN COMPARISON:  None. FINDINGS: Brain: There is a 1 cm peripherally enhancing lesion in the inferior left cerebellum (series 11, image 13). There is a 3 mm left cerebellar lesion on image 15. Questionable additional ill-defined enhancing foci in the left cerebellum, which may be artifactual. Suspected 3 mm enhancing lesion adjacent to the right frontal horn (image 32). No significant associated edema. There is no acute infarction or intracranial hemorrhage. There is no hydrocephalus or extra-axial fluid collection. Prominence of the ventricles and sulci reflects mild generalized parenchymal volume loss. Patchy and confluent T2 hyperintensity in the supratentorial and pontine white matter is nonspecific but  probably reflects moderate chronic microvascular ischemic changes. Vascular: Major vessel flow voids at the skull base are preserved. Skull and upper cervical spine: Normal marrow signal is preserved. Sinuses/Orbits: Minor mucosal thickening. Bilateral lens replacements. Other: Sella is unremarkable.  Right mastoid effusion. 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 Suspected small lesion adjacent to the right frontal horn. Additional chronic/nonemergent  findings detailed above. Electronically Signed   By: Macy Mis M.D.   On: 04/17/2019 12:27   Dg Chest Port 1 View  Result Date: 04/17/2019 CLINICAL DATA:  Respiratory failure. EXAM: PORTABLE CHEST 1 VIEW COMPARISON:  One-view chest x-ray 04/15/2019. CT chest 04/05/2019 FINDINGS: The heart is obscured. Endotracheal tube is stable. Right-sided PICC line is stable. NG tube courses off the inferior border of the film. Near complete opacification of left hemithorax is stable. The right lung is clear. IMPRESSION: 1. Stable appearance of the chest with near complete opacification of the left hemithorax reflecting known left lung mass and post obstructive disease. 2. The support apparatus is stable. Electronically Signed   By: San Morelle M.D.   On: 04/17/2019 07:09   [place] Ekg Site Rite  Result Date: 04/16/2019 If Site Rite image not attached, placement could not be confirmed due to current cardiac rhythm.   Cardiac Studies   TTE 04/15/2019 1. Left ventricular ejection fraction, by visual estimation, is 60 to 65%. The left ventricle has normal function. Left ventricular septal wall thickness was mildly increased. Mildly increased left ventricular posterior wall thickness. There is mildly  increased left ventricular hypertrophy. 2. Patient was extremely tachycardic ranging from 100 to 200 bpm. Left ventricular function appears to be normal. 3. Global right ventricle has normal systolic function.The right ventricular size is normal. No increase in right ventricular wall thickness. 4. Left atrial size was mildly dilated. 5. Right atrial size was severely dilated. 6. The mitral valve is normal in structure. No evidence of mitral valve regurgitation. No evidence of mitral stenosis. 7. The tricuspid valve is normal in structure. Tricuspid valve regurgitation is not demonstrated. 8. The aortic valve is normal in structure. Aortic valve regurgitation is not visualized. No evidence of aortic  valve sclerosis or stenosis. 9. The pulmonic valve was normal in structure. Pulmonic valve regurgitation is not visualized. 10. TR signal is inadequate for assessing pulmonary artery systolic pressure. 11. The inferior vena cava is dilated in size with <50% respiratory variability, suggesting right atrial pressure of 15 mmHg.  Patient Profile     Omar Wagner a 63 y.o.malewith severe COPD, venous insufficiency, prior drug abuse who was admitted for left lung mass with obstructive pneumonia. Cardiology consulted for frequent SVT episodes as well as intermittent bradycardia.  Assessment & Plan    1. Paroxysmal atrial tachycardia/ multifocal atrial tachycardia: telemetry demonstrates sinus rhythm/sinus bradycardia with frequent ectopic atrial rhythm and frequent multifocal atrial tachycardia. No recurrence of SVT with rates in the 190s since starting amiodarone. Likely being driving by underlying pulmonary process (PNA and lung mass 2/2 SCLC). Hopeful this will continue to improve with improvement in his lung disease. Felt to be a poor candidate for PPM given underlying medical condition.  - Continue IV amiodarone while intubated with plans to likely transition to po once extubated.  - No indication for anticoagulation as there has been no clear cut atrial fibrillation on telemetry this admission.   2. Post-obstructive PNA 2/2 lung mas from [place]: patient is currently intubated and undergoing XRT to left lung mass. On IV antibiotics for management  of PNA - Continue management per primary team  For questions or updates, please contact [place] Please consult [email or web address] for contact info under Cardiology/STEMI.      Signed, Abigail Butts, PA-C  04/18/2019, 8:31 AM   [phone]

## 2019-04-18 NOTE — Progress Notes (Signed)
NAME:  Omar Wagner, MRN:  [id number], DOB:  Dec 03, 1955, LOS: 6 ADMISSION DATE:  04/09/2019, CONSULTATION DATE:  04/13/19 REFERRING MD: Louellen Molder, MD CHIEF COMPLAINT:  Lung mass with mets  Brief History   63 year old male with very severe COPD (FEV1 33%) who presents with acute hypoxemic respiratory failure secondary to post-obstructive pneumonia in setting of LUL compression/collapse from left lung mass .  Past Medical History  Very severe COPD (FEV1 33%) in 2018 Depression Hx basal cell [facility] Events   11/26 Admitted 11/27 CT guided biopsy of soft tissue nodule in the left posterior chest wall 11/28 pulmonary consulted.  Recommended routine bronchodilator therapy antibiotics and prednisone taper 11/29: Episodes of hypertension As well as bradycardia.  Alternating with paroxysmal atrial fibrillation with rapid ventricular response/SVT.  Increasing oxygen requirements noted cardiology consulted.  Started amiodarone, felt not a candidate for pacing.  Developed worsening respiratory failure requiring intubation.  Underwent bronchoscopy at bedside showing extensive endobronchial tumor burden in the left upper lobe with extrinsic compression on the left lower lobe. -Having recurrent bradycardia so amiodarone held later in the evening, 11/30: Went back into SVT early a.m. hours.  Heart rate 198 205.  Amiodarone drip resumed.  Also receiving as needed Lopressor.  Marked for first XRT 12/1 still vent dependent. Having freq atrial arrhythmias but more stable. Weaning.   Consults:  [facility] IR PCCM Rad/Onc  Procedures:  PICC 11/30>  Significant Diagnostic Tests:  PET 03/01/19 1. Intensely hypermetabolic (max SUV 38.1) large poorly marginated 8.5 x 4.8 cm posterior left upper lobe lung mass with direct peripheral left chest wall invasion between the lateral left fifth and sixth ribs and direct left paraspinal invasion at the T4 level, significantly increased in  size since 01/08/2019 chest CT, compatible with aggressive locally advanced primary bronchogenic carcinoma. 2. Hypermetabolic ipsilateral mediastinal lymph node metastases in the AP window and left paraesophageal chains. 3. Hypermetabolic distant nodal metastasis in the left external iliac chain. 4. Hypermetabolic posterior left chest wall metastasis between left sixth and seventh ribs. 5. Hypermetabolic osseous metastases to the left sixth rib. 6. PET-CT stage IVB (T4 N2 M1c). 7. New 5 mm right upper lobe pulmonary nodule and slight growth of 4 mm right middle lobe pulmonary nodule, below PET resolution, cannot exclude pulmonary metastases. 8. Aortic Atherosclerosis (ICD10-I70.0) and Emphysema (ICD10-J43.9).  CT Chest W Contrast 03/27/2019 1. New near complete collapse of the left upper lobe felt to be secondary to progression of the patient's known left upper lobe lung mass, compressing the left upper lobe bronchus. 2. Findings consistent with significant interval progression of disease as evidenced by new pathologically enlarged mediastinal lymph nodes as well as multiple new low-attenuation masses in the liver concerning for hepatic metastatic disease. In addition, there are new pulmonary opacities in the right upper and right lower lobes raising concern for additional foci of metastatic disease. 3. Consolidation involving the left lung apex concerning for postobstructive pneumonia. 4. Moderate amount of debris within the trachea and bronchi bilaterally. 5. Significant narrowing of the left lower lobe bronchus. There is consolidation involving the left lower lobe with a small left-sided pleural effusion. 6. New osseous involvement involving the posterior fifth and sixth ribs on the left.  2d ECHO 04/15/19 1. Left ventricular ejection fraction, by visual estimation, is 60 to 65%. The left ventricle has normal function. Left ventricular septal wall thickness was mildly increased.  Mildly increased left ventricular posterior wall thickness. There is mildly  increased left  ventricular hypertrophy.  2. Patient was extremely tachycardic ranging from 100 to 200 bpm. Left ventricular function appears to be normal.  3. Global right ventricle has normal systolic function.The right ventricular size is normal. No increase in right ventricular wall thickness.  4. Left atrial size was mildly dilated.  5. Right atrial size was severely dilated.  6. The mitral valve is normal in structure. No evidence of mitral valve regurgitation. No evidence of mitral stenosis.  7. The tricuspid valve is normal in structure. Tricuspid valve regurgitation is not demonstrated.  8. The aortic valve is normal in structure. Aortic valve regurgitation is not visualized. No evidence of aortic valve sclerosis or stenosis.  9. The pulmonic valve was normal in structure. Pulmonic valve regurgitation is not visualized. 10. TR signal is inadequate for assessing pulmonary artery systolic pressure. 11. The inferior vena cava is dilated in size with <50% respiratory variability, suggesting right atrial pressure of 15 mmHg.  MRI brain 12/1 >> at least 2 small left cerebellar metastatic lesions, possible small metastatic lesion adjacent to the right frontal horn  Micro Data:  BCx 03/25/2019 >> NGTD Sputum Cx 04/13/19  Antimicrobials:  Vanc 11/26>stopped Cefepime 11/26>stopped Ceftriaxone 11/26>stopped Azithro 11/26>stopped Zosyn 11/26> Interim history/subjective:  Tolerating PS 10 this morning with good tidal volumes and a comfortable respiratory pattern Planning for XRT this afternoon Some relative bradycardia, overall hemodynamically stable Note MRI brain results 12/1  Objective   Blood pressure 135/81, pulse (!) 50, temperature (!) 97.5 F (36.4 C), temperature source Axillary, resp. rate 12, height 6\' 1"  (1.854 m), weight 81.2 kg, SpO2 96 %.    Vent Mode: PSV;CPAP FiO2 (%):  [30 %-40 %] 30 % Set  Rate:  [28 bmp] 28 bmp Vt Set:  [470 mL] 470 mL PEEP:  [8 cmH20] 8 cmH20 Pressure Support:  [10 cmH20] 10 cmH20 Plateau Pressure:  [16 cmH20-22 cmH20] 20 cmH20   Intake/Output Summary (Last 24 hours) at 04/18/2019 1036 Last data filed at 04/18/2019 1018 Gross per 24 hour  Intake 3084.13 ml  Output 1050 ml  Net 2034.13 ml   Filed Weights   04/13/19 0540 04/17/19 0500 04/18/19 0409  Weight: 80.6 kg 81.2 kg 81.2 kg   Physical Exam:  General: Ill-appearing man, ventilated HEENT ET tube in place, oropharynx otherwise clear, pupils equal Pulmonary: No breath sounds on the left, distant but clear on the right with normal right-sided excursion Cardiac: Regular, borderline bradycardic 50s Abdomen: Soft, slightly tense, no tenderness, positive bowel sounds Extremities: Bilateral lower extremity erythema, woody edema and some scaling Neuro: Wakes easily to voice, nods to questions appropriately, follows commands, moves all extremities with good strength GU: Clear yellow urine  Resolved Hospital Problem list   N/A  Assessment & Plan:  63 y/o male with severe COPD (FEV1 33%) admitted with COPD exacerbation secondary to postobstructive pneumonia in the setting of left lung mass   Acute hypoxic and hypercarbic respiratory failure in the setting left upper lobe lung mass, complete left atelectasis 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  Hopefully this will allow partial resolution of his postobstructive process Continue antibiotics, day 6/7, currently Zosyn Prednisone day 3 of 5 Tolerating PSV and hopefully approaching position where we can consider extubation.  I would like to allow him to receive another XRT treatment, assess for any potential radiographical improvement.  May decide to repeat his bronchoscopy to  relieve any potential obstructing secretions prior to extubation.  Most importantly we need to undertake discussions with the patient and family regarding goals of care should we extubate.  I would advocate optimizing his tori status to the best of our ability and then extubating with no plans for reintubation given his underlying disease, overall prognosis.  AFRVR, previously bradycardic. -Appreciate cardiology assistance, -Continues to have burst of tachycardia with intermittent bradycardia into the 40's, maintains stable BP -IV amiodarone continues -EF 60-65% with no valvular abnormalities Plan Appreciate cardiology assistance, some intermittent tachycardia/MAT.  Currently bradycardia but tolerating amiodarone.  Plan to continue per their recommendations Optimize electrolytes Defer anticoagulation given bleeding risk, especially with newly identified brain mets  Hyperglycemia (? Steroid induced?), Hgb A1c 5.9 Plan Sliding-scale insulin per protocol  Venous stasis dermatitis involving bilateral lower extremities Plan Eucerin to bilateral lower extremities, appreciate WOC  Fluid and electrolyte imbalance: hypophosphatemia  Plan  Remains low 12/2, replace again 12/2 Follow BMP Continue free water  Mild thrombocytopenia Plan Follow CBC  Best practice:  Diet: Tube feeds started 11/30 Pain/Anxiety/Delirium protocol (if indicated): 11/29 VAP protocol (if indicated): 11/29 DVT prophylaxis: LMWH started 11/29 GI prophylaxis: PPI Glucose control: SSI Mobility: BR Code Status: Full Family Communication: Updated patient 12/2, will communicate with family and friend [name] Disposition: ICU  Independent CC time 61 minutes  Baltazar Apo, MD, PhD 04/18/2019, 10:52 AM [facility] [phone] or if no answer [phone]

## 2019-04-18 NOTE — Progress Notes (Signed)
PT Cancellation Note  Patient Details Name: Omar Wagner MRN: [id number] DOB: 05/17/1956   Cancelled Treatment:     Pt continues on ventilator.  Will dc PT service at this time.  Please reorder when pt condition improves to allow participation in therapies.    [place] Pager [phone] Office [phone]    [facility] 04/18/2019, 8:32 AM

## 2019-04-19 ENCOUNTER — Other Ambulatory Visit: Payer: Self-pay | Admitting: Hematology

## 2019-04-19 ENCOUNTER — Ambulatory Visit: Payer: Medicare Other

## 2019-04-19 ENCOUNTER — Encounter (HOSPITAL_COMMUNITY): Payer: Self-pay | Admitting: Oncology

## 2019-04-19 ENCOUNTER — Ambulatory Visit
Admit: 2019-04-19 | Discharge: 2019-04-19 | Disposition: A | Discharge status: Home / Self Care | Payer: Medicare Other | Attending: Radiation Oncology | Admitting: Radiation Oncology

## 2019-04-19 ENCOUNTER — Inpatient Hospital Stay (HOSPITAL_COMMUNITY): Payer: Medicare Other

## 2019-04-19 DIAGNOSIS — C3490 Malignant neoplasm of unspecified part of unspecified bronchus or lung: Secondary | ICD-10-CM

## 2019-04-19 DIAGNOSIS — Z794 Long term (current) use of insulin: Secondary | ICD-10-CM

## 2019-04-19 DIAGNOSIS — I890 Lymphedema, not elsewhere classified: Secondary | ICD-10-CM

## 2019-04-19 DIAGNOSIS — D696 Thrombocytopenia, unspecified: Secondary | ICD-10-CM

## 2019-04-19 DIAGNOSIS — C7931 Secondary malignant neoplasm of brain: Secondary | ICD-10-CM

## 2019-04-19 DIAGNOSIS — Z7189 Other specified counseling: Secondary | ICD-10-CM

## 2019-04-19 DIAGNOSIS — F17210 Nicotine dependence, cigarettes, uncomplicated: Secondary | ICD-10-CM

## 2019-04-19 DIAGNOSIS — E119 Type 2 diabetes mellitus without complications: Secondary | ICD-10-CM

## 2019-04-19 DIAGNOSIS — Z803 Family history of malignant neoplasm of breast: Secondary | ICD-10-CM

## 2019-04-19 DIAGNOSIS — Z79899 Other long term (current) drug therapy: Secondary | ICD-10-CM

## 2019-04-19 DIAGNOSIS — Z800 Family history of malignant neoplasm of digestive organs: Secondary | ICD-10-CM

## 2019-04-19 LAB — CALCIUM
Calcium: 7 mg/dL — ABNORMAL LOW (ref 8.9–10.3)
Calcium: 7.2 mg/dL — ABNORMAL LOW (ref 8.9–10.3)

## 2019-04-19 LAB — GLUCOSE, CAPILLARY
Glucose-Capillary: 125 mg/dL — ABNORMAL HIGH (ref 70–99)
Glucose-Capillary: 134 mg/dL — ABNORMAL HIGH (ref 70–99)
Glucose-Capillary: 155 mg/dL — ABNORMAL HIGH (ref 70–99)
Glucose-Capillary: 176 mg/dL — ABNORMAL HIGH (ref 70–99)
Glucose-Capillary: 88 mg/dL (ref 70–99)
Glucose-Capillary: 79 mg/dL (ref 70–99)

## 2019-04-19 LAB — BASIC METABOLIC PANEL
Anion gap: 8 (ref 5–15)
BUN: 28 mg/dL — ABNORMAL HIGH (ref 8–23)
CO2: 24 mmol/L (ref 22–32)
Calcium: 7.3 mg/dL — ABNORMAL LOW (ref 8.9–10.3)
Chloride: 109 mmol/L (ref 98–111)
Creatinine, Ser: 0.83 mg/dL (ref 0.61–1.24)
GFR calc Af Amer: >60 mL/min (ref >60)
GFR calc non Af Amer: >60 mL/min (ref >60)
Glucose, Bld: 120 mg/dL — ABNORMAL HIGH (ref 70–99)
Potassium: 3.1 mmol/L — ABNORMAL LOW (ref 3.5–5.1)
Sodium: 141 mmol/L (ref 135–145)

## 2019-04-19 LAB — CBC
HCT: 42.5 % (ref 39.0–52.0)
Hemoglobin: 13.5 g/dL (ref 13.0–17.0)
MCH: 35.8 pg — ABNORMAL HIGH (ref 26.0–34.0)
MCHC: 31.8 g/dL (ref 30.0–36.0)
MCV: 112.7 fL — ABNORMAL HIGH (ref 80.0–100.0)
Platelets: 106 10*3/uL — ABNORMAL LOW (ref 150–400)
RBC: 3.77 MIL/uL — ABNORMAL LOW (ref 4.22–5.81)
RDW: 12.6 % (ref 11.5–15.5)
WBC: 9.7 10*3/uL (ref 4.0–10.5)
nRBC: 0 % (ref 0.0–0.2)

## 2019-04-19 LAB — MAGNESIUM: Magnesium: 1.6 mg/dL — ABNORMAL LOW (ref 1.7–2.4)

## 2019-04-19 LAB — PTH-RELATED PEPTIDE: PTH-related peptide: <2 pmol/L

## 2019-04-19 LAB — PHOSPHORUS: Phosphorus: 2.2 mg/dL — ABNORMAL LOW (ref 2.5–4.6)

## 2019-04-19 MED ORDER — POTASSIUM PHOSPHATES 15 MMOLE/5ML IV SOLN
30.0000 mmol | Freq: Once | INTRAVENOUS | Status: AC
  Administered 2019-04-19: 30 mmol via INTRAVENOUS
  Filled 2019-04-19: qty 10

## 2019-04-19 MED ORDER — MAGNESIUM SULFATE 4 GM/100ML IV SOLN
4.0000 g | Freq: Once | INTRAVENOUS | Status: AC
  Administered 2019-04-19: 4 g via INTRAVENOUS
  Filled 2019-04-19: qty 100

## 2019-04-19 MED ORDER — SODIUM CHLORIDE 0.9% FLUSH
10.0000 mL | Freq: Two times a day (BID) | INTRAVENOUS | Status: DC
  Administered 2019-04-19: 10 mL
  Administered 2019-04-19: 20 mL
  Administered 2019-04-20: 22:00:00 10 mL
  Administered 2019-04-20: 10:00:00 20 mL
  Administered 2019-04-21: 10:00:00 40 mL
  Administered 2019-04-21 – 2019-04-22 (×3): 10 mL
  Administered 2019-04-23: 40 mL
  Administered 2019-04-23 – 2019-04-24 (×2): 30 mL
  Administered 2019-04-24 – 2019-04-25 (×2): 10 mL
  Administered 2019-04-25: 20 mL
  Administered 2019-04-26: 10 mL
  Administered 2019-04-26: 30 mL
  Administered 2019-04-27: 10 mL
  Administered 2019-04-27 – 2019-04-28 (×2): 40 mL
  Administered 2019-04-28: 30 mL

## 2019-04-19 MED ORDER — DIPHENHYDRAMINE HCL 50 MG/ML IJ SOLN
12.5000 mg | Freq: Once | INTRAMUSCULAR | Status: AC | PRN
  Administered 2019-04-19: 12.5 mg via INTRAVENOUS
  Filled 2019-04-19: qty 1

## 2019-04-19 MED ORDER — SODIUM CHLORIDE 0.9% FLUSH: 10.0000 mL | INTRAVENOUS | Status: DC | PRN

## 2019-04-19 NOTE — Progress Notes (Signed)
OT Cancellation Note  Patient Details Name: Omar Wagner MRN: [id number] DOB: [birth year]   Cancelled Treatment:    Reason Eval/Treat Not Completed: Medical issues which prohibited therapy. Pt on vent; per notes, may soon be extubated. Will check back tomorrow  Omar Wagner 04/19/2019, 11:10 AM  Lesle Chris, OTR/L Acute Rehabilitation Services [phone] [facility] pager [phone] office 04/19/2019

## 2019-04-19 NOTE — Progress Notes (Signed)
Rt assisted MD with bedside bronch. Pt had no adverse reaction to procedure.

## 2019-04-19 NOTE — Progress Notes (Addendum)
NAME:  Omar Wagner, MRN:  [id number], DOB:  1955-07-12, LOS: 7 ADMISSION DATE:  04/15/2019, CONSULTATION DATE:  04/13/19 REFERRING MD: Louellen Molder, MD CHIEF COMPLAINT:  Lung mass with mets  Brief History   62 year old male with very severe COPD (FEV1 33%) who presents with acute hypoxemic respiratory failure secondary to post-obstructive pneumonia in setting of LUL compression/collapse from left lung mass .  Past Medical History  Very severe COPD (FEV1 33%) in 2018 Depression Hx basal cell [facility]   11/26 Admitted 11/27 CT guided biopsy of soft tissue nodule in the left posterior chest wall 11/28 pulmonary consulted.  Recommended routine bronchodilator therapy antibiotics and prednisone taper 11/29: Episodes of hypertension As well as bradycardia.  Alternating with paroxysmal atrial fibrillation with rapid ventricular response/SVT.  Increasing oxygen requirements noted cardiology consulted.  Started amiodarone, felt not a candidate for pacing.  Developed worsening respiratory failure requiring intubation.  Underwent bronchoscopy at bedside showing extensive endobronchial tumor burden in the left upper lobe with extrinsic compression on the left lower lobe. -Having recurrent bradycardia so amiodarone held later in the evening, 11/30: Went back into SVT early a.m. hours.  Heart rate 198 205.  Amiodarone drip resumed.  Also receiving as needed Lopressor.  Marked for first XRT 12/1 still vent dependent. Having freq atrial arrhythmias but more stable. Weaning.   Consults:  TRH IR PCCM Rad/Onc  Procedures:  PICC 11/30>  Significant Diagnostic Tests:  PET 03/01/19 1. Intensely hypermetabolic (max SUV 62.1) large poorly marginated 8.5 x 4.8 cm posterior left upper lobe lung mass with direct peripheral left chest wall invasion between the lateral left fifth and sixth ribs and direct left paraspinal invasion at the T4 level, significantly increased in  size since 01/08/2019 chest CT, compatible with aggressive locally advanced primary bronchogenic carcinoma. 2. Hypermetabolic ipsilateral mediastinal lymph node metastases in the AP window and left paraesophageal chains. 3. Hypermetabolic distant nodal metastasis in the left external iliac chain. 4. Hypermetabolic posterior left chest wall metastasis between left sixth and seventh ribs. 5. Hypermetabolic osseous metastases to the left sixth rib. 6. PET-CT stage IVB (T4 N2 M1c). 7. New 5 mm right upper lobe pulmonary nodule and slight growth of 4 mm right middle lobe pulmonary nodule, below PET resolution, cannot exclude pulmonary metastases. 8. Aortic Atherosclerosis (ICD10-I70.0) and Emphysema (ICD10-J43.9).  CT Chest W Contrast 03/24/2019 1. New near complete collapse of the left upper lobe felt to be secondary to progression of the patient's known left upper lobe lung mass, compressing the left upper lobe bronchus. 2. Findings consistent with significant interval progression of disease as evidenced by new pathologically enlarged mediastinal lymph nodes as well as multiple new low-attenuation masses in the liver concerning for hepatic metastatic disease. In addition, there are new pulmonary opacities in the right upper and right lower lobes raising concern for additional foci of metastatic disease. 3. Consolidation involving the left lung apex concerning for postobstructive pneumonia. 4. Moderate amount of debris within the trachea and bronchi bilaterally. 5. Significant narrowing of the left lower lobe bronchus. There is consolidation involving the left lower lobe with a small left-sided pleural effusion. 6. New osseous involvement involving the posterior fifth and sixth ribs on the left.  2d ECHO 04/15/19 1. Left ventricular ejection fraction, by visual estimation, is 60 to 65%. The left ventricle has normal function. Left ventricular septal wall thickness was mildly increased.  Mildly increased left ventricular posterior wall thickness. There is mildly  increased left  ventricular hypertrophy.  2. Patient was extremely tachycardic ranging from 100 to 200 bpm. Left ventricular function appears to be normal.  3. Global right ventricle has normal systolic function.The right ventricular size is normal. No increase in right ventricular wall thickness.  4. Left atrial size was mildly dilated.  5. Right atrial size was severely dilated.  6. The mitral valve is normal in structure. No evidence of mitral valve regurgitation. No evidence of mitral stenosis.  7. The tricuspid valve is normal in structure. Tricuspid valve regurgitation is not demonstrated.  8. The aortic valve is normal in structure. Aortic valve regurgitation is not visualized. No evidence of aortic valve sclerosis or stenosis.  9. The pulmonic valve was normal in structure. Pulmonic valve regurgitation is not visualized. 10. TR signal is inadequate for assessing pulmonary artery systolic pressure. 11. The inferior vena cava is dilated in size with <50% respiratory variability, suggesting right atrial pressure of 15 mmHg.  MRI brain 12/1 >> at least 2 small left cerebellar metastatic lesions, possible small metastatic lesion adjacent to the right frontal horn  Micro Data:  BCx 03/18/2019 >> NGTD Sputum Cx 04/13/19  Antimicrobials:  Vanc 11/26>stopped Cefepime 11/26>stopped Ceftriaxone 11/26>stopped Azithro 11/26>stopped Zosyn 11/26> Interim history/subjective:  Doing well with pressure support ventilation Objective   Blood pressure 100/69, pulse (Abnormal) 45, temperature 97.8 F (36.6 C), temperature source Oral, resp. rate (Abnormal) 28, height 6\' 1"  (1.854 m), weight 91.4 kg, SpO2 97 %.    Vent Mode: PSV;CPAP FiO2 (%):  [30 %] 30 % Set Rate:  [28 bmp] 28 bmp Vt Set:  [470 mL] 470 mL PEEP:  [5 cmH20-8 cmH20] 8 cmH20 Pressure Support:  [5 cmH20-10 cmH20] 5 cmH20 Plateau Pressure:  [20 cmH20-22  cmH20] 20 cmH20   Intake/Output Summary (Last 24 hours) at 04/19/2019 0945 Last data filed at 04/19/2019 0800 Gross per 24 hour  Intake 2889.82 ml  Output 600 ml  Net 2289.82 ml   Filed Weights   04/17/19 0500 04/18/19 0409 04/19/19 0450  Weight: 81.2 kg 81.2 kg 91.4 kg   Physical Exam:   General: This is a chronically ill-appearing 62 year old white male who remains ventilatory dependent but is awake and cooperative HEENT normocephalic atraumatic no jugular venous to tension appreciated he is orally intubated Pulmonary: Clear to auscultation remarkably diminished on the left no accessory use on pressure support of 5 and PEEP of 8. Cardiac: Regular irregular without murmur Abdomen: Soft nontender Extremities: Warm and dry, scaly lower extremities but these have improved in exam since starting Eucerin Neuro: Awake, interactive, follows commands, no focal deficits. GU: Clear yellow.  Resolved Hospital Problem list   N/A  Assessment & Plan:  63 y/o male with severe COPD (FEV1 33%) admitted with COPD exacerbation secondary to postobstructive pneumonia in the setting of left lung mass   Acute hypoxic and hypercarbic respiratory failure in the setting left upper lobe lung mass, complete left atelectasis Small cell lung cancer with associated postobstructive atelectasis/pneumonia COPD exacerbation Portable chest x-ray personally reviewed continues to demonstrate essentially complete opacification of the thorax his ventilator mechanics are excellent on spontaneous breathing trial Plan Continue radiation therapy he is for treatment #3 today  Day #7 of 7 Zosyn  Prednisone day #4 of 5  Will evaluate for therapeutic bronchoscopy today to see if we can recruit left lung and place him in a situation where he is more likely to be successfully extubated.  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  Currently bradycardia but tolerating amiodarone.   Plan Continue amiodarone for now Not a anticoagulation candidate given brain mets Telemetry monitoring  Hyperglycemia (? Steroid induced?), Hgb A1c 5.9 Plan Continuing sliding scale insulin  Venous stasis dermatitis involving bilateral lower extremities Plan Continue Eucerin to bilateral lower extremities as directed by wound ostomy team  Fluid and electrolyte imbalance: Hypokalemia, hypomagnesemia,Hypophosphatemia Plan  Replace potassium, phosphorus, and magnesium Repeat chemistry in a.m.  Mild thrombocytopenia -Mildly improved Plan Continue to follow Best practice:  Diet: Tube feeds started 11/30 Pain/Anxiety/Delirium protocol (if indicated): 11/29 VAP protocol (if indicated): 11/29 DVT prophylaxis: LMWH started 11/29 GI prophylaxis: PPI Glucose control: SSI Mobility: BR Code Status: Full Family Communication: Updated patient 12/2, will communicate with family and friend [name] Disposition: He is now status post 2 doses of radiation therapy.  He continues to do very well with pressure support ventilation with mechanics that favor extubation.  Historically it is difficult to radiate a person off from a ventilator, the fact that his ventilator mechanics look so good are reassuring.  I think for today we will proceed with bronchoscopy to try to clear out mucus, and see if that sets the stage for successful extubation.  Unfortunately he has metastatic disease, I think we need to talk about goals of care once he is extubated.   cct 32 minutes Erick Colace ACNP-BC [place] Pager # [phone] OR # [phone] if no answer

## 2019-04-19 NOTE — Progress Notes (Signed)
Pt placed back on CPAP/PS by MD.

## 2019-04-19 NOTE — Consult Note (Addendum)
[facility]  [phone] [phone]   MEDICAL ONCOLOGY - INITIAL CONSULTATION  Referral MD: Dr. Kyung Rudd  Reason for Referral: Extensive stage small cell lung cancer  HPI: [name] is a 63 year old male with past medical history significant for COPD, chronic lymphedema, PSVT, history of squamous cell carcinoma of the skin, and history of drug abuse.  The patient presented to the emergency room with shortness of breath and cough.  His cough and shortness of breath were progressively getting worse.  In the ER, a CT scan was performed which showed new near pleat collapse of the left upper lobe felt to be secondary to progression of the patient's known left upper lobe lung mass, compressing the left upper lobe bronchus, findings consistent with significant interval progression of disease as evidenced by new pathologically enlarged mediastinal lymph nodes as well as multiple new low-attenuation masses in the liver concerning for hepatic metastatic disease.  Additionally the patient had new pulmonary opacities in the right upper and right lower lobes raising concern for additional foci of metastatic disease.  The patient may also have a an area of consolidation involving the left lung apex concerning for postobstructive pneumonia.  There was significant narrowing of the left lower lobe bronchus and new osseous involvement involving the posterior fifth and sixth ribs on the left.  The patient underwent a CT-guided biopsy on 04/13/2019 and pathology was consistent with small cell carcinoma.  Due to respiratory failure, the patient was intubated on 04/15/2019.  Radiation oncology has been consulted and patient is receiving radiation to the left lung mass.  The patient had MRI of the brain with and without contrast on 04/17/2019 which showed at least 2 small left cerebellar metastatic lesions, suspected small lesion adjacent to the right frontal horn.  Radiation oncology plans  for radiation to the brain in the near future.  The patient has been followed by pulmonology as an outpatient for his COPD and a lung mass. The patient was supposed to have a bronchoscopy on 03/15/2019, the patient canceled this appointment as he felt he was having a COPD flare.  Prior imaging has been reviewed and chest x-ray from 10/27/2018, the patient was noted to have a focal density in the left mid upper lung that could represent infiltrate versus mass.  He then had a CT of the chest without contrast on 01/08/2019 which showed a 5.9 cm mass in the posterior left upper lobe suspicious for primary bronchogenic neoplasm and possible lymphangitic spread, small mediastinal lymph nodes.  He also had a PET scan on 03/01/2019 which showed an intensely hypermetabolic large poorly marginated 8.5 x 4.8 centimeter posterior left upper lobe lung mass with direct peripheral left chest wall invasion between the lateral left fifth and sixth ribs and direct left paraspinal invasion at the T4 level, significantly increased in size since 7/48/2707, hypermetabolic ipsilateral mediastinal lymph node metastases in the AP window and left paraesophageal chains, hypermetabolic distant nodal metastasis in the left external iliac chain, hypermetabolic posterior left chest wall metastasis between the left sixth and seventh ribs, and hypermetabolic osseous metastases to the left sixth rib.  The patient is currently on the ventilator.  Slowly being weaned.  He is alert and able to communicate by shaking his head and also writing on a clipboard.  Unable to obtain full review of systems but the patient was able to tell me he was not having any anorexia or weight loss prior to admission.  Denies pain today.  Patient  indicates that his friend, [name], is his preferred contact and her numbers in the chart.  He has a sister named [name] that may also be contacted.  Other telephone numbers are in the chart.   Past Medical History:  Diagnosis  Date  . Basal cell carcinoma   . Brain metastasis ([place])   . Chronic venous insufficiency   . COPD (chronic obstructive pulmonary disease) ([place])   . Depression   . History of heroin use   . Lung mass   . Lymphedema   . Paroxysmal SVT (supraventricular tachycardia) ([facility])   :  Past Surgical History:  Procedure Laterality Date  . HERNIA REPAIR    :  Current Facility-Administered Medications  Medication Dose Route Frequency Provider Last Rate Last Dose  . acetaminophen (TYLENOL) tablet 650 mg  650 mg Oral Q6H PRN Doutova, Anastassia, MD       Or  . acetaminophen (TYLENOL) suppository 650 mg  650 mg Rectal Q6H PRN Toy Baker, MD   650 mg at 04/15/19 1744  . amiodarone (NEXTERONE PREMIX) 360-4.14 MG/200ML-% (1.8 mg/mL) IV infusion  30 mg/hr Intravenous Continuous Bensimhon, Shaune Pascal, MD 16.67 mL/hr at 04/19/19 0800 30 mg/hr at 04/19/19 0800  . amiodarone (NEXTERONE) IV bolus only 150 mg/100 mL  150 mg Intravenous Once Anders Simmonds, MD      . chlorhexidine gluconate (MEDLINE KIT) (PERIDEX) 0.12 % solution 15 mL  15 mL Mouth Rinse BID Erick Colace, NP   15 mL at 04/19/19 0720  . Chlorhexidine Gluconate Cloth 2 % PADS 6 each  6 each Topical Daily Toy Baker, MD   6 each at 04/19/19 0904  . enoxaparin (LOVENOX) injection 40 mg  40 mg Subcutaneous Q24H Erick Colace, NP   40 mg at 04/19/19 1130  . feeding supplement (VITAL AF 1.2 CAL) liquid 1,000 mL  1,000 mL Per Tube Continuous Kipp Brood, MD 70 mL/hr at 04/18/19 1815 1,000 mL at 04/18/19 1815  . fentaNYL (SUBLIMAZE) injection 25 mcg  25 mcg Intravenous Q15 min PRN Margaretha Seeds, MD   25 mcg at 04/15/19 1650  . fentaNYL (SUBLIMAZE) injection 25-100 mcg  25-100 mcg Intravenous Q30 min PRN Margaretha Seeds, MD   100 mcg at 04/15/19 1932  . fentaNYL 2561mg in NS 2572m(1072mml) infusion-PREMIX  0-200 mcg/hr Intravenous Continuous SomAnders SimmondsD 15 mL/hr at 04/19/19 0800 150 mcg/hr at 04/19/19 0800  .  free water 200 mL  200 mL Per Tube Q6H BabErick ColaceP   200 mL at 04/19/19 1111  . hydrocerin (EUCERIN) cream   Topical BID Agarwala, RavEinar GradD      . insulin aspart (novoLOG) injection 0-9 Units  0-9 Units Subcutaneous Q4H BabErick ColaceP   2 Units at 04/19/19 1135  . ipratropium (ATROVENT) nebulizer solution 0.5 mg  0.5 mg Nebulization Q6H EllMargaretha SeedsD   0.5 mg at 04/19/19 0807  . levalbuterol (XOPENEX) nebulizer solution 0.63 mg  0.63 mg Nebulization Q6H EllMargaretha SeedsD   0.63 mg at 04/19/19 [id number] magnesium sulfate IVPB 4 g 100 mL  4 g Intravenous Once BabErick ColaceP 50 mL/hr at 04/19/19 1124 4 g at 04/19/19 1124  . MEDLINE mouth rinse  15 mL Mouth Rinse 10 times per day BabErick ColaceP   15 mL at 04/19/19 1112  . midazolam (VERSED) injection 1 mg  1 mg Intravenous Q2H PRN EllMargaretha SeedsD   1  mg at 04/19/19 1133  . montelukast (SINGULAIR) tablet 10 mg  10 mg Oral QHS Minda Ditto, RPH   10 mg at 04/18/19 2112  . ondansetron (ZOFRAN) tablet 4 mg  4 mg Oral Q6H PRN Toy Baker, MD       Or  . ondansetron (ZOFRAN) injection 4 mg  4 mg Intravenous Q6H PRN Doutova, Anastassia, MD      . pantoprazole sodium (PROTONIX) 40 mg/20 mL oral suspension 40 mg  40 mg Per Tube Q1200 Erick Colace, NP   40 mg at 04/19/19 1130  . piperacillin-tazobactam (ZOSYN) IVPB 3.375 g  3.375 g Intravenous Q8H Toy Baker, MD   Stopped at 04/19/19 0905  . potassium PHOSPHATE 30 mmol in dextrose 5 % 500 mL infusion  30 mmol Intravenous Once Erick Colace, NP 85 mL/hr at 04/19/19 1155 30 mmol at 04/19/19 1155  . predniSONE 5 MG/ML concentrated solution 30 mg  30 mg Per Tube Q breakfast Kipp Brood, MD   30 mg at 04/19/19 0817  . sodium chloride flush (NS) 0.9 % injection 10-40 mL  10-40 mL Intracatheter Q12H Dhungel, Nishant, MD   10 mL at 04/19/19 0905  . sodium chloride flush (NS) 0.9 % injection 10-40 mL  10-40 mL Intracatheter PRN Dhungel, Nishant, MD       . sodium chloride flush (NS) 0.9 % injection 10-40 mL  10-40 mL Intracatheter Q12H Collene Gobble, MD   20 mL at 04/19/19 0904  . sodium chloride flush (NS) 0.9 % injection 10-40 mL  10-40 mL Intracatheter PRN Byrum, Rose Fillers, MD         No Known Allergies:  Family History  Problem Relation Age of Onset  . Cancer Other   . Breast cancer Mother   . Colon cancer Father   . Breast cancer Sister   . Liver cancer Brother   :  Social History   Socioeconomic History  . Marital status: Widowed    Spouse name: Not on file  . Number of children: Not on file  . Years of education: Not on file  . Highest education level: Not on file  Occupational History  . Not on file  Social Needs  . Financial resource strain: Not on file  . Food insecurity    Worry: Not on file    Inability: Not on file  . Transportation needs    Medical: Not on file    Non-medical: Not on file  Tobacco Use  . Smoking status: Current Some Day Smoker    Packs/day: 1.00    Years: 30.00    Pack years: 30.00    Types: Cigarettes  . Smokeless tobacco: Never Used  Substance and Sexual Activity  . Alcohol use: Yes    Comment: social  . 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 Sexual activity: Never    Comment: opiate abuse  Lifestyle  . Physical activity    Days per week: Not on file    Minutes per session: Not on file  . Stress: Not on file  Relationships  . Social Herbalist on phone: Not on file    Gets together: Not on file    Attends religious service: Not on file    Active member of club or organization: Not on file    Attends meetings of clubs or organizations: Not on file    Relationship status: Not  on file  . Intimate partner violence    Fear of current or ex partner: Not on file    Emotionally abused: Not on file    Physically abused: Not on file    Forced sexual activity: Not on file  Other Topics Concern  . Not on file  Social History Narrative   . Not on file  :  Review of Systems: Unable to obtain a comprehensive 14 point review of systems.  See HPI.  Exam: Patient Vitals for the past 24 hrs:  BP Temp Temp src Pulse Resp SpO2 Weight  04/19/19 1200 114/70 - - (!) 118 19 92 % -  04/19/19 1127 - 97.6 F (36.4 C) Axillary - - - -  04/19/19 0900 128/62 - - (!) 48 15 99 % -  04/19/19 0816 - 97.8 F (36.6 C) Oral - - 97 % -  04/19/19 0800 100/69 - - (!) 45 (!) 28 97 % -  04/19/19 0700 100/61 - - (!) 46 (!) 28 97 % -  04/19/19 0600 (!) 97/59 - - (!) 45 (!) 28 97 % -  04/19/19 0500 (!) 95/58 - - (!) 54 (!) 25 95 % -  04/19/19 0450 - - - - - - 201 lb 8 oz (91.4 kg)  04/19/19 0400 94/60 - - (!) 47 (!) 28 96 % -  04/19/19 0321 - 97.8 F (36.6 C) Oral - - - -  04/19/19 0300 97/60 - - (!) 48 (!) 28 97 % -  04/19/19 0200 95/65 - - (!) 52 (!) 28 97 % -  04/19/19 0100 120/74 - - (!) 49 (!) 29 97 % -  04/18/19 2314 - 97.6 F (36.4 C) Axillary - - - -  04/18/19 2300 (!) 156/125 - - (!) 59 20 100 % -  04/18/19 2200 135/75 - - (!) 51 (!) 24 99 % -  04/18/19 2100 130/81 - - (!) 54 (!) 22 98 % -  04/18/19 2000 126/79 - - (!) 52 18 99 % -  04/18/19 1958 - - - - - 98 % -  04/18/19 1933 - 98.6 F (37 C) Axillary - - - -  04/18/19 1900 107/75 - - (!) 52 14 97 % -  04/18/19 1800 125/83 - - (!) 52 15 98 % -  04/18/19 1700 (!) 142/96 - - (!) 51 13 97 % -  04/18/19 1641 (!) 164/86 - - (!) 52 13 98 % -  04/18/19 1600 - 98.7 F (37.1 C) Oral - - - -  04/18/19 1515 (!) 150/88 - - (!) 55 15 99 % -  04/18/19 1400 123/70 - - (!) 53 15 96 % -  04/18/19 1300 131/80 97.6 F (36.4 C) Axillary (!) 50 12 95 % -    General: Phonically ill-appearing male, no distress, on the ventilator Eyes:  no scleral icterus.   ENT:  There were no oropharyngeal lesions.   Neck was without thyromegaly.   Lymphatics:  Negative cervical, supraclavicular or axillary adenopathy.   Respiratory: Diminished breath sounds on the left Cardiovascular:  Regular rate and  rhythm, S1/S2, without murmur, rub or gallop.   GI:  abdomen was soft, flat, nontender, nondistended, without organomegaly.   Musculoskeletal:  no spinal tenderness of palpation of vertebral spine.   Skin: No rash or petechiae, scaly lower extremities Neuro: The patient was alert and interactive, follows commands.  No focal neuro deficits.   Lab Results  Component Value Date   WBC 9.7  04/19/2019   HGB 13.5 04/19/2019   HCT 42.5 04/19/2019   PLT 106 (L) 04/19/2019   GLUCOSE 120 (H) 04/19/2019   ALT 14 04/17/2019   AST 23 04/17/2019   NA 141 04/19/2019   K 3.1 (L) 04/19/2019   CL 109 04/19/2019   CREATININE 0.83 04/19/2019   BUN 28 (H) 04/19/2019   CO2 24 04/19/2019    Dg Abd 1 View  Result Date: 04/15/2019 CLINICAL DATA:  Status post OG tube placement. EXAM: ABDOMEN - 1 VIEW COMPARISON:  None. FINDINGS: OG tube is in place with the tip in the distal stomach. IMPRESSION: As above. Electronically Signed   By: Inge Rise M.D.   On: 04/15/2019 15:51   Ct Chest W Contrast  Result Date: 04/09/2019 CLINICAL DATA:  Shortness of breath and chest pain. History of basal cell carcinoma. EXAM: CT CHEST WITH CONTRAST TECHNIQUE: Multidetector CT imaging of the chest was performed during intravenous contrast administration. CONTRAST:  77m OMNIPAQUE IOHEXOL 300 MG/ML  SOLN COMPARISON:  01/08/2019.  PET-CT dated 03/01/2019 FINDINGS: Cardiovascular: The heart size is stable from prior study. The main pulmonary artery is dilated measuring approximately 3.6 cm in diameter. There is no aortic aneurysm. No thoracic aortic dissection. Mediastinum/Nodes: --there are new pathologically enlarged mediastinal lymph nodes measuring up to approximately 2.3 cm in the short axis. There are pathologically enlarged left hilar lymph nodes. --No axillary lymphadenopathy. --No supraclavicular lymphadenopathy. --Normal thyroid gland. --The esophagus is unremarkable Lungs/Pleura: There is new near complete collapse of  the left upper lobe. There is consolidation at the left lung apex which may represent postobstructive pneumonia. The left upper lobe bronchus appears to be occluded. There is significant narrowing of the left lower lobe bronchus. There is some consolidation involving the left lower lobe with a small left-sided pleural effusion. The previously demonstrated left upper lobe lung mass appears to have substantially increased in size but is difficult to distinguish from the collapsed lung. An estimate is that the mass mass measures at least 9.2 x 7.6 cm. There are mild emphysematous changes in the right lung field. There is a growing spiculated 1 cm pulmonary nodule in the right upper lobe (axial series 5, image 54). There is a significant amount of debris within the trachea and right mainstem bronchus. There is a new pulmonary opacity in the anterior right upper lobe measuring approximately 1.7 cm (axial series 5, image 62). There is some atelectasis versus consolidation involving the right lower lobe. Upper Abdomen: There are new hypoattenuating masses in the liver measuring 1.9 cm in hepatic segment 4A and 1.7 cm in hepatic segment 4 B. Musculoskeletal: There is new destruction of the posterior fifth and sixth ribs on the left consistent with osseous involvement of the known lung mass. IMPRESSION: 1. New near complete collapse of the left upper lobe felt to be secondary to progression of the patient's known left upper lobe lung mass, compressing the left upper lobe bronchus. 2. Findings consistent with significant interval progression of disease as evidenced by new pathologically enlarged mediastinal lymph nodes as well as multiple new low-attenuation masses in the liver concerning for hepatic metastatic disease. In addition, there are new pulmonary opacities in the right upper and right lower lobes raising concern for additional foci of metastatic disease. 3. Consolidation involving the left lung apex concerning for  postobstructive pneumonia. 4. Moderate amount of debris within the trachea and bronchi bilaterally. 5. Significant narrowing of the left lower lobe bronchus. There is consolidation involving the left lower  lobe with a small left-sided pleural effusion. 6. New osseous involvement involving the posterior fifth and sixth ribs on the left. Electronically Signed   By: Constance Holster M.D.   On: 04/14/2019 17:44   Mr Jeri Cos JQ Contrast  Result Date: 04/17/2019 CLINICAL DATA:  Lung cancer, staging EXAM: MRI HEAD WITHOUT AND WITH CONTRAST TECHNIQUE: Multiplanar, multiecho pulse sequences of the brain and surrounding structures were obtained without and with intravenous contrast. CONTRAST:  45m GADAVIST GADOBUTROL 1 MMOL/ML IV SOLN COMPARISON:  None. FINDINGS: Brain: There is a 1 cm peripherally enhancing lesion in the inferior left cerebellum (series 11, image 13). There is a 3 mm left cerebellar lesion on image 15. Questionable additional ill-defined enhancing foci in the left cerebellum, which may be artifactual. Suspected 3 mm enhancing lesion adjacent to the right frontal horn (image 32). No significant associated edema. There is no acute infarction or intracranial hemorrhage. There is no hydrocephalus or extra-axial fluid collection. Prominence of the ventricles and sulci reflects mild generalized parenchymal volume loss. Patchy and confluent T2 hyperintensity in the supratentorial and pontine white matter is nonspecific but probably reflects moderate chronic microvascular ischemic changes. Vascular: Major vessel flow voids at the skull base are preserved. Skull and upper cervical spine: Normal marrow signal is preserved. Sinuses/Orbits: Minor mucosal thickening. Bilateral lens replacements. Other: Sella is unremarkable.  Right mastoid effusion. 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 Suspected small lesion adjacent to the right frontal horn. Additional chronic/nonemergent findings  detailed above. Electronically Signed   By: PMacy MisM.D.   On: 04/17/2019 12:27   Ct Biopsy  Result Date: 04/13/2019 INDICATION: Concern for metastatic lung cancer. Please perform CT-guided biopsy of enlarging hypermetabolic soft tissue between the posterior aspect of left sixth and seventh ribs for tissue diagnostic purposes. EXAM: CT-GUIDED BIOPSY OF HYPERMETABOLIC SOFT TISSUE BETWEEN THE POSTERIOR ASPECTS OF THE LEFT SIXTH AND SEVENTH RIBS. COMPARISON:  Chest CT-04/01/2019; 01/08/2019; PET-CT-03/01/2019 MEDICATIONS: None. ANESTHESIA/SEDATION: Fentanyl 100 mcg IV; Versed 2 mg IV Sedation time: 14 minutes; The patient was continuously monitored during the procedure by the interventional radiology nurse under my direct supervision. CONTRAST:  None. COMPLICATIONS: None immediate. PROCEDURE: Informed consent was obtained from the patient following an explanation of the procedure, risks, benefits and alternatives. A time out was performed prior to the initiation of the procedure. The patient was positioned prone on the CT table and a limited CT was performed for procedural planning demonstrating unchanged size and appearance of the ill-defined infiltrative soft tissue involving the left posterior chest wall between the left sixth and seventh ribs with dominant soft tissue nodule measuring approximately 2.3 x 1.6 cm (image 17, series 2), previously found to be hypermetabolic on preceding PET-CT. The procedure was planned. The operative site was prepped and draped in the usual sterile fashion. Appropriate trajectory was confirmed with a 22 gauge spinal needle after the adjacent tissues were anesthetized with 1% Lidocaine with epinephrine. Under intermittent CT guidance, a 17 gauge coaxial needle was advanced into the peripheral aspect of the mass. Appropriate positioning was confirmed and 5 core needle biopsy samples were obtained with an 18 gauge core needle biopsy device. The co-axial needle was removed and  hemostasis was achieved with manual compression. A limited postprocedural CT was negative for pneumothorax, hemorrhage or additional complication. A dressing was placed. The patient tolerated the procedure well without immediate postprocedural complication. IMPRESSION: Technically successful CT guided core needle biopsy of infiltrative hypermetabolic soft tissue nodule between the posterior aspects of  the left sixth and seventh ribs. Electronically Signed   By: Sandi Mariscal M.D.   On: 04/13/2019 13:53   Dg Chest Port 1 View  Result Date: 04/19/2019 CLINICAL DATA:  11/29: Episodes of hypertension As well as bradycardia. Alternating with paroxysmal atrial fibrillation with rapid ventricular response/SVT. Increasing oxygen requirements noted cardiology consulted. Started amiodarone, felt not a candidate for pacing. Developed worsening respiratory failure requiring intubation. Underwent bronchoscopy at bedside showing extensive endobronchial tumor burden in the left upper lobe with extrinsic compression on the left lower lobe.-Having recurrent bradycardia so amiodarone held later in the evening, 11/30: Went back into SVT early a.m. hours. Heart rate 198 205. Amiodarone drip resumed. Also receiving as needed Lopressor. Marked for first XRT12/1 still vent dependent. Having freq atrial arrhythmias but more stable EXAM: PORTABLE CHEST 1 VIEW COMPARISON:  04/18/2019 and earlier exams. FINDINGS: Left hemithorax remains near completely opacified. Mild opacity at the medial right lung base consistent with atelectasis. Right lung otherwise clear. Endotracheal tube, nasal/orogastric tube and right-sided PICC are stable. No pneumothorax. IMPRESSION: 1. No change from previous day's study. 2. Stable support apparatus. Electronically Signed   By: Lajean Manes M.D.   On: 04/19/2019 09:18   Dg Chest Port 1 View  Result Date: 04/18/2019 CLINICAL DATA:  Follow-up exam.  Atelectasis.  Intubated patient. EXAM: PORTABLE CHEST 1  VIEW COMPARISON:  04/17/2019 and earlier exams. FINDINGS: Near complete opacification of the left hemithorax is unchanged from the previous day's exam. Mild opacity at the medial right lung base is noted consistent with atelectasis. Right lung otherwise clear. Endotracheal tube, right PICC and nasal/orogastric tube are stable and well positioned. No pneumothorax. IMPRESSION: 1. No change from the previous day's exam. 2. Persistent near complete opacification of the left hemithorax due to the patient's known mass and postobstructive changes. 3. Stable well-positioned support apparatus. Electronically Signed   By: Lajean Manes M.D.   On: 04/18/2019 09:09   Dg Chest Port 1 View  Result Date: 04/17/2019 CLINICAL DATA:  Respiratory failure. EXAM: PORTABLE CHEST 1 VIEW COMPARISON:  One-view chest x-ray 04/15/2019. CT chest 03/19/2019 FINDINGS: The heart is obscured. Endotracheal tube is stable. Right-sided PICC line is stable. NG tube courses off the inferior border of the film. Near complete opacification of left hemithorax is stable. The right lung is clear. IMPRESSION: 1. Stable appearance of the chest with near complete opacification of the left hemithorax reflecting known left lung mass and post obstructive disease. 2. The support apparatus is stable. Electronically Signed   By: San Morelle M.D.   On: 04/17/2019 07:09   Dg Chest Port 1 View  Result Date: 04/15/2019 CLINICAL DATA:  Status post ET tube placement. EXAM: PORTABLE CHEST 1 VIEW COMPARISON:  Chest radiograph 04/15/2019 FINDINGS: Monitoring leads overlie the patient. ET tube mid trachea. Enteric tube courses inferior to the diaphragm. Persistent opacification of the left hemithorax. Minimal right basilar atelectasis. Thoracic spine degenerative changes. IMPRESSION: ET tube mid trachea. Persistent opacification of the left hemithorax. Electronically Signed   By: Lovey Newcomer M.D.   On: 04/15/2019 15:30   Dg Chest Port 1 View  Result  Date: 04/15/2019 CLINICAL DATA:  Dyspnea EXAM: PORTABLE CHEST 1 VIEW COMPARISON:  Chest x-rays dated 04/14/2019 04/11/2019. FINDINGS: Continued complete opacification of the LEFT hemithorax. RIGHT lung remains clear. IMPRESSION: 1. Continued complete opacification of the LEFT hemithorax, compatible with previous description of a known malignancy and associated postobstructive atelectasis. 2. RIGHT lung remains clear. Electronically Signed   By: Cherlynn Kaiser  Enriqueta Shutter M.D.   On: 04/15/2019 08:52   Dg Chest Port 1 View  Result Date: 04/14/2019 CLINICAL DATA:  Shortness of breath EXAM: PORTABLE CHEST 1 VIEW COMPARISON:  Chest radiograph 03/21/2019 FINDINGS: Monitoring leads overlie the patient. Interval worsening complete opacification left hemithorax. Minimal right basilar atelectasis. Thoracic spine degenerative changes. IMPRESSION: Interval worsening of opacification of the left hemithorax most compatible with known malignancy and associated postobstructive atelectasis. Electronically Signed   By: Lovey Newcomer M.D.   On: 04/14/2019 17:39   Dg Chest Port 1 View  Result Date: 03/31/2019 CLINICAL DATA:  Patient reports to the ED with complaint of SOB and chest pain. Patient has a hx of lymphoma and reports he has been receiving care. [name] legs have an odor and his skin is dry and flaky EXAM: PORTABLE CHEST 1 VIEW COMPARISON:  10/27/2018.  PET-CT, 03/01/2019. FINDINGS: Near complete opacification of the left hemithorax. This has developed since prior chest radiograph, and since the more recent prior PET-CT. The opacification is consistent combination of the posterior left upper lobe hypermetabolic mass noted on the prior PET-CT in combination atelectasis or postobstructive consolidation. Pleural fluid is also suspected. Right lung is hyperexpanded, but otherwise clear. No right pleural effusion. No pneumothorax. IMPRESSION: 1. Significant interval worsening left lung aeration. Left hemithorax is now mostly  opacified consistent with a combination of the presumed left upper lobe malignancy with postobstructive change/atelectasis and probable pleural fluid. Electronically Signed   By: Lajean Manes M.D.   On: 03/19/2019 14:25   [place] Ekg Site Rite  Result Date: 04/16/2019 If Site Rite image not attached, placement could not be confirmed due to current cardiac rhythm.    Dg Abd 1 View  Result Date: 04/15/2019 CLINICAL DATA:  Status post OG tube placement. EXAM: ABDOMEN - 1 VIEW COMPARISON:  None. FINDINGS: OG tube is in place with the tip in the distal stomach. IMPRESSION: As above. Electronically Signed   By: Inge Rise M.D.   On: 04/15/2019 15:51   Ct Chest W Contrast  Result Date: 03/28/2019 CLINICAL DATA:  Shortness of breath and chest pain. History of basal cell carcinoma. EXAM: CT CHEST WITH CONTRAST TECHNIQUE: Multidetector CT imaging of the chest was performed during intravenous contrast administration. CONTRAST:  25m OMNIPAQUE IOHEXOL 300 MG/ML  SOLN COMPARISON:  01/08/2019.  PET-CT dated 03/01/2019 FINDINGS: Cardiovascular: The heart size is stable from prior study. The main pulmonary artery is dilated measuring approximately 3.6 cm in diameter. There is no aortic aneurysm. No thoracic aortic dissection. Mediastinum/Nodes: --there are new pathologically enlarged mediastinal lymph nodes measuring up to approximately 2.3 cm in the short axis. There are pathologically enlarged left hilar lymph nodes. --No axillary lymphadenopathy. --No supraclavicular lymphadenopathy. --Normal thyroid gland. --The esophagus is unremarkable Lungs/Pleura: There is new near complete collapse of the left upper lobe. There is consolidation at the left lung apex which may represent postobstructive pneumonia. The left upper lobe bronchus appears to be occluded. There is significant narrowing of the left lower lobe bronchus. There is some consolidation involving the left lower lobe with a small left-sided pleural  effusion. The previously demonstrated left upper lobe lung mass appears to have substantially increased in size but is difficult to distinguish from the collapsed lung. An estimate is that the mass mass measures at least 9.2 x 7.6 cm. There are mild emphysematous changes in the right lung field. There is a growing spiculated 1 cm pulmonary nodule in the right upper lobe (axial series 5, image 54). There is  a significant amount of debris within the trachea and right mainstem bronchus. There is a new pulmonary opacity in the anterior right upper lobe measuring approximately 1.7 cm (axial series 5, image 62). There is some atelectasis versus consolidation involving the right lower lobe. Upper Abdomen: There are new hypoattenuating masses in the liver measuring 1.9 cm in hepatic segment 4A and 1.7 cm in hepatic segment 4 B. Musculoskeletal: There is new destruction of the posterior fifth and sixth ribs on the left consistent with osseous involvement of the known lung mass. IMPRESSION: 1. New near complete collapse of the left upper lobe felt to be secondary to progression of the patient's known left upper lobe lung mass, compressing the left upper lobe bronchus. 2. Findings consistent with significant interval progression of disease as evidenced by new pathologically enlarged mediastinal lymph nodes as well as multiple new low-attenuation masses in the liver concerning for hepatic metastatic disease. In addition, there are new pulmonary opacities in the right upper and right lower lobes raising concern for additional foci of metastatic disease. 3. Consolidation involving the left lung apex concerning for postobstructive pneumonia. 4. Moderate amount of debris within the trachea and bronchi bilaterally. 5. Significant narrowing of the left lower lobe bronchus. There is consolidation involving the left lower lobe with a small left-sided pleural effusion. 6. New osseous involvement involving the posterior fifth and sixth  ribs on the left. Electronically Signed   By: Constance Holster M.D.   On: 04/02/2019 17:44   Mr Jeri Cos QM Contrast  Result Date: 04/17/2019 CLINICAL DATA:  Lung cancer, staging EXAM: MRI HEAD WITHOUT AND WITH CONTRAST TECHNIQUE: Multiplanar, multiecho pulse sequences of the brain and surrounding structures were obtained without and with intravenous contrast. CONTRAST:  88m GADAVIST GADOBUTROL 1 MMOL/ML IV SOLN COMPARISON:  None. FINDINGS: Brain: There is a 1 cm peripherally enhancing lesion in the inferior left cerebellum (series 11, image 13). There is a 3 mm left cerebellar lesion on image 15. Questionable additional ill-defined enhancing foci in the left cerebellum, which may be artifactual. Suspected 3 mm enhancing lesion adjacent to the right frontal horn (image 32). No significant associated edema. There is no acute infarction or intracranial hemorrhage. There is no hydrocephalus or extra-axial fluid collection. Prominence of the ventricles and sulci reflects mild generalized parenchymal volume loss. Patchy and confluent T2 hyperintensity in the supratentorial and pontine white matter is nonspecific but probably reflects moderate chronic microvascular ischemic changes. Vascular: Major vessel flow voids at the skull base are preserved. Skull and upper cervical spine: Normal marrow signal is preserved. Sinuses/Orbits: Minor mucosal thickening. Bilateral lens replacements. Other: Sella is unremarkable.  Right mastoid effusion. 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 Suspected small lesion adjacent to the right frontal horn. Additional chronic/nonemergent findings detailed above. Electronically Signed   By: PMacy MisM.D.   On: 04/17/2019 12:27   Ct Biopsy  Result Date: 04/13/2019 INDICATION: Concern for metastatic lung cancer. Please perform CT-guided biopsy of enlarging hypermetabolic soft tissue between the posterior aspect of left sixth and seventh ribs for tissue  diagnostic purposes. EXAM: CT-GUIDED BIOPSY OF HYPERMETABOLIC SOFT TISSUE BETWEEN THE POSTERIOR ASPECTS OF THE LEFT SIXTH AND SEVENTH RIBS. COMPARISON:  Chest CT-04/02/2019; 01/08/2019; PET-CT-03/01/2019 MEDICATIONS: None. ANESTHESIA/SEDATION: Fentanyl 100 mcg IV; Versed 2 mg IV Sedation time: 14 minutes; The patient was continuously monitored during the procedure by the interventional radiology nurse under my direct supervision. CONTRAST:  None. COMPLICATIONS: None immediate. PROCEDURE: Informed consent was obtained from the patient  following an explanation of the procedure, risks, benefits and alternatives. A time out was performed prior to the initiation of the procedure. The patient was positioned prone on the CT table and a limited CT was performed for procedural planning demonstrating unchanged size and appearance of the ill-defined infiltrative soft tissue involving the left posterior chest wall between the left sixth and seventh ribs with dominant soft tissue nodule measuring approximately 2.3 x 1.6 cm (image 17, series 2), previously found to be hypermetabolic on preceding PET-CT. The procedure was planned. The operative site was prepped and draped in the usual sterile fashion. Appropriate trajectory was confirmed with a 22 gauge spinal needle after the adjacent tissues were anesthetized with 1% Lidocaine with epinephrine. Under intermittent CT guidance, a 17 gauge coaxial needle was advanced into the peripheral aspect of the mass. Appropriate positioning was confirmed and 5 core needle biopsy samples were obtained with an 18 gauge core needle biopsy device. The co-axial needle was removed and hemostasis was achieved with manual compression. A limited postprocedural CT was negative for pneumothorax, hemorrhage or additional complication. A dressing was placed. The patient tolerated the procedure well without immediate postprocedural complication. IMPRESSION: Technically successful CT guided core needle  biopsy of infiltrative hypermetabolic soft tissue nodule between the posterior aspects of the left sixth and seventh ribs. Electronically Signed   By: Sandi Mariscal M.D.   On: 04/13/2019 13:53   Dg Chest Port 1 View  Result Date: 04/19/2019 CLINICAL DATA:  11/29: Episodes of hypertension As well as bradycardia. Alternating with paroxysmal atrial fibrillation with rapid ventricular response/SVT. Increasing oxygen requirements noted cardiology consulted. Started amiodarone, felt not a candidate for pacing. Developed worsening respiratory failure requiring intubation. Underwent bronchoscopy at bedside showing extensive endobronchial tumor burden in the left upper lobe with extrinsic compression on the left lower lobe.-Having recurrent bradycardia so amiodarone held later in the evening, 11/30: Went back into SVT early a.m. hours. Heart rate 198 205. Amiodarone drip resumed. Also receiving as needed Lopressor. Marked for first XRT12/1 still vent dependent. Having freq atrial arrhythmias but more stable EXAM: PORTABLE CHEST 1 VIEW COMPARISON:  04/18/2019 and earlier exams. FINDINGS: Left hemithorax remains near completely opacified. Mild opacity at the medial right lung base consistent with atelectasis. Right lung otherwise clear. Endotracheal tube, nasal/orogastric tube and right-sided PICC are stable. No pneumothorax. IMPRESSION: 1. No change from previous day's study. 2. Stable support apparatus. Electronically Signed   By: Lajean Manes M.D.   On: 04/19/2019 09:18   Dg Chest Port 1 View  Result Date: 04/18/2019 CLINICAL DATA:  Follow-up exam.  Atelectasis.  Intubated patient. EXAM: PORTABLE CHEST 1 VIEW COMPARISON:  04/17/2019 and earlier exams. FINDINGS: Near complete opacification of the left hemithorax is unchanged from the previous day's exam. Mild opacity at the medial right lung base is noted consistent with atelectasis. Right lung otherwise clear. Endotracheal tube, right PICC and nasal/orogastric tube  are stable and well positioned. No pneumothorax. IMPRESSION: 1. No change from the previous day's exam. 2. Persistent near complete opacification of the left hemithorax due to the patient's known mass and postobstructive changes. 3. Stable well-positioned support apparatus. Electronically Signed   By: Lajean Manes M.D.   On: 04/18/2019 09:09   Dg Chest Port 1 View  Result Date: 04/17/2019 CLINICAL DATA:  Respiratory failure. EXAM: PORTABLE CHEST 1 VIEW COMPARISON:  One-view chest x-ray 04/15/2019. CT chest 04/15/2019 FINDINGS: The heart is obscured. Endotracheal tube is stable. Right-sided PICC line is stable. NG tube courses off the  inferior border of the film. Near complete opacification of left hemithorax is stable. The right lung is clear. IMPRESSION: 1. Stable appearance of the chest with near complete opacification of the left hemithorax reflecting known left lung mass and post obstructive disease. 2. The support apparatus is stable. Electronically Signed   By: San Morelle M.D.   On: 04/17/2019 07:09   Dg Chest Port 1 View  Result Date: 04/15/2019 CLINICAL DATA:  Status post ET tube placement. EXAM: PORTABLE CHEST 1 VIEW COMPARISON:  Chest radiograph 04/15/2019 FINDINGS: Monitoring leads overlie the patient. ET tube mid trachea. Enteric tube courses inferior to the diaphragm. Persistent opacification of the left hemithorax. Minimal right basilar atelectasis. Thoracic spine degenerative changes. IMPRESSION: ET tube mid trachea. Persistent opacification of the left hemithorax. Electronically Signed   By: Lovey Newcomer M.D.   On: 04/15/2019 15:30   Dg Chest Port 1 View  Result Date: 04/15/2019 CLINICAL DATA:  Dyspnea EXAM: PORTABLE CHEST 1 VIEW COMPARISON:  Chest x-rays dated 04/14/2019 03/23/2019. FINDINGS: Continued complete opacification of the LEFT hemithorax. RIGHT lung remains clear. IMPRESSION: 1. Continued complete opacification of the LEFT hemithorax, compatible with previous  description of a known malignancy and associated postobstructive atelectasis. 2. RIGHT lung remains clear. Electronically Signed   By: Franki Cabot M.D.   On: 04/15/2019 08:52   Dg Chest Port 1 View  Result Date: 04/14/2019 CLINICAL DATA:  Shortness of breath EXAM: PORTABLE CHEST 1 VIEW COMPARISON:  Chest radiograph 04/04/2019 FINDINGS: Monitoring leads overlie the patient. Interval worsening complete opacification left hemithorax. Minimal right basilar atelectasis. Thoracic spine degenerative changes. IMPRESSION: Interval worsening of opacification of the left hemithorax most compatible with known malignancy and associated postobstructive atelectasis. Electronically Signed   By: Lovey Newcomer M.D.   On: 04/14/2019 17:39   Dg Chest Port 1 View  Result Date: 04/03/2019 CLINICAL DATA:  Patient reports to the ED with complaint of SOB and chest pain. Patient has a hx of lymphoma and reports he has been receiving care. [name] legs have an odor and his skin is dry and flaky EXAM: PORTABLE CHEST 1 VIEW COMPARISON:  10/27/2018.  PET-CT, 03/01/2019. FINDINGS: Near complete opacification of the left hemithorax. This has developed since prior chest radiograph, and since the more recent prior PET-CT. The opacification is consistent combination of the posterior left upper lobe hypermetabolic mass noted on the prior PET-CT in combination atelectasis or postobstructive consolidation. Pleural fluid is also suspected. Right lung is hyperexpanded, but otherwise clear. No right pleural effusion. No pneumothorax. IMPRESSION: 1. Significant interval worsening left lung aeration. Left hemithorax is now mostly opacified consistent with a combination of the presumed left upper lobe malignancy with postobstructive change/atelectasis and probable pleural fluid. Electronically Signed   By: Lajean Manes M.D.   On: 04/01/2019 14:25   [place] Ekg Site Rite  Result Date: 04/16/2019 If Site Rite image not attached, placement could not  be confirmed due to current cardiac rhythm.   Pathology:  SURGICAL PATHOLOGY  CASE: [id number]  PATIENT: Kailan [place]  Surgical Pathology Report   Clinical History: Concern for metastatic lung cancer, post CT guided Bx  of hypermetabolic left posterior chest wall nodule (cm)   FINAL MICROSCOPIC DIAGNOSIS:   A. CHEST, LEFT POSTERIOR WALL, BIOPSY:  - Small cell carcinoma. See comment   COMMENT:   Dr. Jeannie Done has reviewed the case and concurs with the above diagnosis.  Ms. Worthy Flank was notified on 04/16/2019.   Assessment and Plan:  This is a 63 year old male  with:  1.  Extensive stage small cell lung cancer -The patient was noted to have a possible mass on chest x-ray from 10/27/2018. -CT of the chest performed on 01/08/2019 demonstrated a 5.9 cm mass in the posterior left upper lobe possible lymphangitic spread and small mediastinal lymph nodes -PET scan performed on 03/01/2019 demonstrated a hypermetabolic 8.5 x 4.8 cm in the posterior left upper lobe of the lung with direct peripheral left stool invasion between the lateral left fifth and sixth ribs and direct left paraspinal invasion at the T4 level.  Additionally there were also hypermetabolic ipsilateral mediastinal lymph nodes, hypermetabolic distant nodal metastasis in the left external iliac chain, hypermetabolic posterior left chest wall metastasis between the left sixth and seventh ribs, and hypermetabolic osseous metastases to the left sixth rib. -The patient was admitted to the hospital on 04/16/2019 and a CT of the chest showed new near complete collapse of the left upper lobe secondary to progression of the patient's known left upper lobe lung mass, compressing the left upper lobe bronchus.  There also multiple new low-attenuation masses in the liver concerning for hepatic metastatic disease, progression in the mediastinal lymph nodes, and new pulmonary opacities in the right upper and right lower lobes. -CT-guided  biopsy of the hypermetabolic left posterior chest wall nodule performed on 04/13/2019 demonstrated small cell carcinoma. -The patient initiated radiation to the left lung mass on 04/16/2019. -MRI of the brain on 04/17/2019 demonstrated at least 2 small left cerebellar metastatic lesions and suspected small lesion adjacent to the right frontal horn. -The patient will continue radiation to the left lung mass under the care of Dr. Lisbeth Renshaw. -We will consider systemic chemotherapy for his small cell lung cancer.  The patient is currently being weaned off the ventilator and PCCM thinks he may be close to extubation.  May consider starting systemic chemotherapy early next week. -Brain metastases may respond to systemic chemotherapy and will discuss with radiation oncology about holding on radiation to the brain if he is not symptomatic.  2.  Acute hypoxic respiratory failure due to also lung cancer with associated postobstructive atelectasis/pneumonia and COPD exacerbation -Respiratory status slowly improving with radiation, steroids, and IV antibiotics. -Ongoing management per PCCM  3.  Mild thrombocytopenia  4.  Hypercalcemia of malignancy -Calcium was 14.1 on admission and now corrected -Status post zoledronic acid on 03/24/2019 and he also received calcitonin and IV hydration -We will monitor calcium closely and may need to repeat zoledronic acid as an outpatient  5.  Sinus tachycardia/SVT -On amiodarone -Not currently on anticoagulation secondary to brain mets  6.  Chronic lymphedema -Followed by wound clinic as an outpatient  I attempted to contact the patient's friend, [name], by telephone but was unable to reach her.  I contacted the patient's sister, [name], lives in [place].  I discussed diagnosis, prognosis and her plans for systemic chemotherapy to likely start while he is inpatient.  I answered all of her questions to the best of my ability.  She appreciated the telephone call.  Thank  you for this referral.   Mikey Bussing, DNP, AGPCNP-BC, AOCNP   ADDENDUM  .Patient was Personally and independently interviewed, examined and relevant elements of the history of present illness were reviewed in details and an assessment and plan was created. All elements of the patient's history of present illness , assessment and plan were discussed in details with Mikey Bussing, DNP. The above documentation reflects our combined findings assessment and plan.  Patient has now been  extubated and when evaluated by me this PM wasalert, awake and verbal and oriented to his situation, diagnosis, prognosis and able to comprehend his current concerning status. I discussed with him in details his diagnosis of extensive stage small cell lung cancer, palliative and non curative nature of treatment, treatment options, risk and potential adverse effects. Informed consent was obtained to proceed with palliative chemotherapy (Carboplatin/Etoposide) ASAP. Orders placed. Hope to start carbo/etoposide with G-CSF support from tomorrow. C1 as inpatient due to patients tenuous respiratory condition . Would plan subsequent cycles as outpatient. Plan to add Atezolizumab as outpatient. Will need Zometa q4weeks as outpatient for bone metastases. Discussed with Rad onc. Patient will complete remaining chest  RT concurrently. Since brain mets are asymptomatic and minimal at this time would recommend this consideration after systemic palliative chemotherapy.  Sullivan Lone MD MS

## 2019-04-19 NOTE — Procedures (Signed)
Bronchoscopy Procedure Note RAZI HICKLE [id number] 02-02-1956  Procedure: Bronchoscopy Indications: Diagnostic evaluation of the airways, Remove secretions and atelectasis  Procedure Details Consent: Risks of procedure as well as the alternatives and risks of each were explained to the (patient/caregiver).  Consent for procedure obtained. Time Out: Verified patient identification, verified procedure, site/side was marked, verified correct patient position, special equipment/implants available, medications/allergies/relevent history reviewed, required imaging and test results available.  Performed  In preparation for procedure, patient was given 100% FiO2 and bronchoscope lubricated. Sedation: fentanyl  Airway entered and the following bronchi were examined: RUL, RML, RLL, LUL and LLL.  Right-sided airways were patent without any significant secretions.  The left upper lobe was fully occluded by an exophytic irregularly shaped endobronchial mass that extended distally into the segmental airways.  Was associated erythema, no active bleeding.  The left lower lobe airways were slightly compressed but were patent.  Thin clear secretions were suctioned successfully.  Left lower lobe airways were clear at the conclusion of the procedure  Procedures performed: Secretion clearance from left lower lobe airways  Evaluation Hemodynamic Status: BP stable throughout; O2 sats: stable throughout [name] Current Condition: stable Specimens:  None Complications: No apparent complications Patient did tolerate procedure well.   Baltazar Apo, MD, PhD 04/19/2019, 11:27 AM [facility] [phone] or if no answer [phone]

## 2019-04-19 NOTE — Procedures (Signed)
Extubation Procedure Note  Patient Details:   Name: Omar Wagner DOB: [birth year] MRN: [id number]   Airway Documentation:    Vent end date: 04/19/19 Vent end time: 1518   Evaluation  O2 sats: stable throughout Complications: No apparent complications Patient did tolerate procedure well. Bilateral Breath Sounds: Clear, Diminished   Yes  Johnette Abraham 04/19/2019, 3:19 PM

## 2019-04-19 NOTE — Progress Notes (Signed)
eLink Physician-Brief Progress Note Patient Name: FRANKIE SCIPIO DOB: 02-22-1956 MRN: [id number]   Date of Service  04/19/2019  HPI/Events of Note  Patient requesting for something to help him sleep. Recently extubated and remains NPO.  eICU Interventions  Benadryl IV prn once for insomnia     Intervention Category Minor Interventions: Other:  Judd Lien 04/19/2019, 8:18 PM

## 2019-04-19 NOTE — Progress Notes (Signed)
Rt placed pt back on full support due to WOB and SOB. RN at bedside.

## 2019-04-19 NOTE — Progress Notes (Signed)
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 Because of this we will postpone simulation for his brain treatment. We will however continue with his lung treatment and will proceed with systemic therapy first. I spoke with him again this evening to let him know our discussion and he is in agreement with this plan. I also spent time with his sister by phone to make sure she was aware of this plan of care. We will plan on whole brain radiation at a later date.      [name], PAC

## 2019-04-19 NOTE — Progress Notes (Signed)
Amiodarone Drug - Drug Interaction Consult Note  Recommendations: No interacting medications; no action required at this time.  Amiodarone is metabolized by the cytochrome P450 system and therefore has the potential to cause many drug interactions. Amiodarone has an average plasma half-life of 50 days (range 20 to 100 days).   There is potential for drug interactions to occur several weeks or months after stopping treatment and the onset of drug interactions may be slow after initiating amiodarone.   []  Statins: Increased risk of myopathy. Simvastatin- restrict dose to 20mg  daily. Other statins: counsel patients to report any muscle pain or weakness immediately.  []  Anticoagulants: Amiodarone can increase anticoagulant effect. Consider warfarin dose reduction. Patients should be monitored closely and the dose of anticoagulant altered accordingly, remembering that amiodarone levels take several weeks to stabilize.  []  Antiepileptics: Amiodarone can increase plasma concentration of phenytoin, the dose should be reduced. Note that small changes in phenytoin dose can result in large changes in levels. Monitor patient and counsel on signs of toxicity.  []  Beta blockers: increased risk of bradycardia, AV block and myocardial depression. Sotalol - avoid concomitant use.  []   Calcium channel blockers (diltiazem and verapamil): increased risk of bradycardia, AV block and myocardial depression.  []   Cyclosporine: Amiodarone increases levels of cyclosporine. Reduced dose of cyclosporine is recommended.  []  Digoxin dose should be halved when amiodarone is started.  []  Diuretics: increased risk of cardiotoxicity if hypokalemia occurs.  []  Oral hypoglycemic agents (glyburide, glipizide, glimepiride): increased risk of hypoglycemia. [name] glucose levels should be monitored closely when initiating amiodarone therapy.   []  Drugs that prolong the QT interval:  Torsades de pointes risk may be increased  with concurrent use - avoid if possible.  Monitor QTc, also keep magnesium/potassium WNL if concurrent therapy can't be avoided. Marland Kitchen Antibiotics: e.g. fluoroquinolones, erythromycin. . Antiarrhythmics: e.g. quinidine, procainamide, disopyramide, sotalol. . Antipsychotics: e.g. phenothiazines, haloperidol.  . Lithium, tricyclic antidepressants, and methadone. Thank You,  Nylen Creque A  04/19/2019 1:57 PM

## 2019-04-19 NOTE — Progress Notes (Signed)
START ON PATHWAY REGIMEN - Small Cell Lung     Cycles 1 through 4, every 21 days:     Atezolizumab      Carboplatin      Etoposide    Cycles 5 and beyond, every 21 days:     Atezolizumab   **Always confirm dose/schedule in your pharmacy ordering system**  Patient Characteristics: Newly Diagnosed, Preoperative or Nonsurgical Candidate (Clinical Staging), First Line, Extensive Stage Therapeutic Status: Newly Diagnosed, Preoperative or Nonsurgical Candidate (Clinical Staging) AJCC T Category: cTX AJCC N Category: cNX AJCC M Category: pM1c AJCC 8 Stage Grouping: IVB Stage Classification: Extensive Intent of Therapy: Non-Curative / Palliative Intent, Discussed with Patient

## 2019-04-19 NOTE — Progress Notes (Addendum)
eLink Physician-Brief Progress Note Patient Name: XAINE SANSOM DOB: 11-Feb-1956 MRN: [id number]   Date of Service  04/19/2019  HPI/Events of Note  Ca++ = 7.7 which corrects to 8.98 (normal) given last albumin = 2.4.   eICU Interventions  No intervention indicated.      Intervention Category Major Interventions: Electrolyte abnormality - evaluation and management  Scotland Korver Eugene 04/19/2019, 12:04 AM

## 2019-04-19 NOTE — Progress Notes (Signed)
Rt transported pt to Radiation on LTV (vent) with no complications.

## 2019-04-20 ENCOUNTER — Ambulatory Visit
Admit: 2019-04-20 | Discharge: 2019-04-20 | Disposition: A | Discharge status: Home / Self Care | Payer: Medicare Other | Attending: Radiation Oncology | Admitting: Radiation Oncology

## 2019-04-20 ENCOUNTER — Inpatient Hospital Stay (HOSPITAL_COMMUNITY): Payer: Medicare Other

## 2019-04-20 ENCOUNTER — Ambulatory Visit: Payer: Medicare Other

## 2019-04-20 DIAGNOSIS — C7931 Secondary malignant neoplasm of brain: Secondary | ICD-10-CM | POA: Diagnosis not present

## 2019-04-20 DIAGNOSIS — Z5111 Encounter for antineoplastic chemotherapy: Secondary | ICD-10-CM

## 2019-04-20 DIAGNOSIS — C3490 Malignant neoplasm of unspecified part of unspecified bronchus or lung: Secondary | ICD-10-CM | POA: Diagnosis not present

## 2019-04-20 DIAGNOSIS — L8990 Pressure ulcer of unspecified site, unspecified stage: Secondary | ICD-10-CM | POA: Insufficient documentation

## 2019-04-20 LAB — GLUCOSE, CAPILLARY
Glucose-Capillary: 75 mg/dL (ref 70–99)
Glucose-Capillary: 67 mg/dL — ABNORMAL LOW (ref 70–99)
Glucose-Capillary: 131 mg/dL — ABNORMAL HIGH (ref 70–99)
Glucose-Capillary: 95 mg/dL (ref 70–99)
Glucose-Capillary: 115 mg/dL — ABNORMAL HIGH (ref 70–99)
Glucose-Capillary: 98 mg/dL (ref 70–99)
Glucose-Capillary: 152 mg/dL — ABNORMAL HIGH (ref 70–99)

## 2019-04-20 LAB — PHOSPHORUS: Phosphorus: 1.5 mg/dL — ABNORMAL LOW (ref 2.5–4.6)

## 2019-04-20 LAB — COMPREHENSIVE METABOLIC PANEL
ALT: 12 U/L (ref 0–44)
ALT: 13 U/L (ref 0–44)
AST: 27 U/L (ref 15–41)
AST: 30 U/L (ref 15–41)
Albumin: 2.2 g/dL — ABNORMAL LOW (ref 3.5–5.0)
Albumin: 2.4 g/dL — ABNORMAL LOW (ref 3.5–5.0)
Alkaline Phosphatase: 40 U/L (ref 38–126)
Alkaline Phosphatase: 47 U/L (ref 38–126)
Anion gap: 10 (ref 5–15)
Anion gap: 7 (ref 5–15)
BUN: 21 mg/dL (ref 8–23)
BUN: 17 mg/dL (ref 8–23)
CO2: 24 mmol/L (ref 22–32)
CO2: 22 mmol/L (ref 22–32)
Calcium: 7.2 mg/dL — ABNORMAL LOW (ref 8.9–10.3)
Calcium: 6.9 mg/dL — ABNORMAL LOW (ref 8.9–10.3)
Chloride: 111 mmol/L (ref 98–111)
Chloride: 114 mmol/L — ABNORMAL HIGH (ref 98–111)
Creatinine, Ser: 0.79 mg/dL (ref 0.61–1.24)
Creatinine, Ser: 0.78 mg/dL (ref 0.61–1.24)
GFR calc Af Amer: >60 mL/min (ref >60)
GFR calc Af Amer: >60 mL/min (ref >60)
GFR calc non Af Amer: >60 mL/min (ref >60)
GFR calc non Af Amer: >60 mL/min (ref >60)
Glucose, Bld: 73 mg/dL (ref 70–99)
Glucose, Bld: 162 mg/dL — ABNORMAL HIGH (ref 70–99)
Potassium: 3.2 mmol/L — ABNORMAL LOW (ref 3.5–5.1)
Potassium: 3.8 mmol/L (ref 3.5–5.1)
Sodium: 145 mmol/L (ref 135–145)
Sodium: 143 mmol/L (ref 135–145)
Total Bilirubin: 0.4 mg/dL (ref 0.3–1.2)
Total Bilirubin: 0.5 mg/dL (ref 0.3–1.2)
Total Protein: 5 g/dL — ABNORMAL LOW (ref 6.5–8.1)
Total Protein: 5.4 g/dL — ABNORMAL LOW (ref 6.5–8.1)

## 2019-04-20 LAB — BASIC METABOLIC PANEL
Anion gap: 5 (ref 5–15)
BUN: 12 mg/dL (ref 8–23)
CO2: 18 mmol/L — ABNORMAL LOW (ref 22–32)
Calcium: 4.9 mg/dL — CL (ref 8.9–10.3)
Chloride: 121 mmol/L — ABNORMAL HIGH (ref 98–111)
Creatinine, Ser: 0.48 mg/dL — ABNORMAL LOW (ref 0.61–1.24)
GFR calc Af Amer: >60 mL/min (ref >60)
GFR calc non Af Amer: >60 mL/min (ref >60)
Glucose, Bld: 122 mg/dL — ABNORMAL HIGH (ref 70–99)
Potassium: 2.6 mmol/L — CL (ref 3.5–5.1)
Sodium: 144 mmol/L (ref 135–145)

## 2019-04-20 LAB — CALCIUM: Calcium: 7.2 mg/dL — ABNORMAL LOW (ref 8.9–10.3)

## 2019-04-20 MED ORDER — ORAL CARE MOUTH RINSE
15.0000 mL | Freq: Two times a day (BID) | OROMUCOSAL | Status: DC
  Administered 2019-04-21 – 2019-04-28 (×8): 15 mL via OROMUCOSAL

## 2019-04-20 MED ORDER — ONDANSETRON HCL 4 MG PO TABS: 4.0000 mg | ORAL_TABLET | Freq: Four times a day (QID) | ORAL | Status: DC | PRN

## 2019-04-20 MED ORDER — SODIUM CHLORIDE 0.9 % IV SOLN
Freq: Once | INTRAVENOUS | Status: AC
  Administered 2019-04-20: 10:00:00 via INTRAVENOUS

## 2019-04-20 MED ORDER — PROCHLORPERAZINE EDISYLATE 10 MG/2ML IJ SOLN: 10.0000 mg | Freq: Four times a day (QID) | INTRAMUSCULAR | Status: DC | PRN

## 2019-04-20 MED ORDER — LEVALBUTEROL HCL 0.63 MG/3ML IN NEBU
INHALATION_SOLUTION | RESPIRATORY_TRACT | Status: AC
  Administered 2019-04-20: 0.63 mg via RESPIRATORY_TRACT
  Filled 2019-04-20: qty 3

## 2019-04-20 MED ORDER — SODIUM CHLORIDE 0.9 % IV SOLN
150.0000 mg | Freq: Once | INTRAVENOUS | Status: AC
  Administered 2019-04-20: 15:00:00 150 mg via INTRAVENOUS
  Filled 2019-04-20: qty 5

## 2019-04-20 MED ORDER — DEXTROSE 50 % IV SOLN
12.5000 g | Freq: Once | INTRAVENOUS | Status: AC
  Administered 2019-04-20: 06:00:00 12.5 g via INTRAVENOUS

## 2019-04-20 MED ORDER — POTASSIUM PHOSPHATES 15 MMOLE/5ML IV SOLN
20.0000 mmol | Freq: Once | INTRAVENOUS | Status: AC
  Administered 2019-04-20: 22:00:00 20 mmol via INTRAVENOUS
  Filled 2019-04-20: qty 6.67

## 2019-04-20 MED ORDER — PALONOSETRON HCL INJECTION 0.25 MG/5ML
0.2500 mg | Freq: Once | INTRAVENOUS | Status: AC
  Administered 2019-04-20: 0.25 mg via INTRAVENOUS
  Filled 2019-04-20: qty 5

## 2019-04-20 MED ORDER — PROCHLORPERAZINE MALEATE 10 MG PO TABS: 10.0000 mg | ORAL_TABLET | Freq: Four times a day (QID) | ORAL | Status: DC | PRN

## 2019-04-20 MED ORDER — PANTOPRAZOLE SODIUM 40 MG PO TBEC
40.0000 mg | DELAYED_RELEASE_TABLET | Freq: Every day | ORAL | Status: DC
  Administered 2019-04-20 – 2019-04-22 (×3): 40 mg via ORAL
  Filled 2019-04-20 (×3): qty 1

## 2019-04-20 MED ORDER — SODIUM CHLORIDE 0.9 % IV SOLN
10.0000 mg | Freq: Once | INTRAVENOUS | Status: AC
  Administered 2019-04-21: 10 mg via INTRAVENOUS
  Filled 2019-04-20: qty 1

## 2019-04-20 MED ORDER — SODIUM CHLORIDE 0.9 % IV SOLN
100.0000 mg/m2 | Freq: Once | INTRAVENOUS | Status: AC
  Administered 2019-04-22: 220 mg via INTRAVENOUS
  Filled 2019-04-20: qty 11

## 2019-04-20 MED ORDER — SODIUM CHLORIDE 0.9 % IV SOLN
100.0000 mg/m2 | Freq: Once | INTRAVENOUS | Status: AC
  Administered 2019-04-20: 16:00:00 220 mg via INTRAVENOUS
  Filled 2019-04-20: qty 11

## 2019-04-20 MED ORDER — LEVALBUTEROL HCL 0.63 MG/3ML IN NEBU
0.6300 mg | INHALATION_SOLUTION | Freq: Four times a day (QID) | RESPIRATORY_TRACT | Status: DC
  Administered 2019-04-20 – 2019-04-22 (×8): 0.63 mg via RESPIRATORY_TRACT
  Filled 2019-04-20 (×8): qty 3

## 2019-04-20 MED ORDER — IPRATROPIUM BROMIDE 0.02 % IN SOLN
0.5000 mg | Freq: Four times a day (QID) | RESPIRATORY_TRACT | Status: DC
  Administered 2019-04-20 – 2019-04-22 (×8): 0.5 mg via RESPIRATORY_TRACT
  Filled 2019-04-20 (×8): qty 2.5

## 2019-04-20 MED ORDER — RESOURCE THICKENUP CLEAR PO POWD
Freq: Three times a day (TID) | ORAL | Status: DC
  Administered 2019-04-20 – 2019-04-22 (×9): via ORAL
  Filled 2019-04-20: qty 125

## 2019-04-20 MED ORDER — SODIUM CHLORIDE 0.9 % IV SOLN
100.0000 mg/m2 | Freq: Once | INTRAVENOUS | Status: AC
  Administered 2019-04-21: 14:00:00 220 mg via INTRAVENOUS
  Filled 2019-04-20: qty 11

## 2019-04-20 MED ORDER — ONDANSETRON HCL 4 MG/2ML IJ SOLN
4.0000 mg | Freq: Four times a day (QID) | INTRAMUSCULAR | Status: DC | PRN
  Administered 2019-04-26: 4 mg via INTRAVENOUS
  Filled 2019-04-20: qty 2

## 2019-04-20 MED ORDER — DEXTROSE 50 % IV SOLN
INTRAVENOUS | Status: AC
  Administered 2019-04-20: 12.5 g via INTRAVENOUS
  Filled 2019-04-20: qty 50

## 2019-04-20 MED ORDER — SODIUM CHLORIDE 0.9 % IV SOLN
601.0000 mg | Freq: Once | INTRAVENOUS | Status: AC
  Administered 2019-04-20: 15:00:00 600 mg via INTRAVENOUS
  Filled 2019-04-20: qty 60

## 2019-04-20 MED ORDER — DEXTROSE-NACL 5-0.45 % IV SOLN
INTRAVENOUS | Status: AC
  Administered 2019-04-20 – 2019-04-21 (×2): via INTRAVENOUS

## 2019-04-20 MED ORDER — SODIUM CHLORIDE 0.9 % IV SOLN
10.0000 mg | Freq: Once | INTRAVENOUS | Status: AC
  Administered 2019-04-22: 10 mg via INTRAVENOUS
  Filled 2019-04-20: qty 1

## 2019-04-20 MED ORDER — POTASSIUM CHLORIDE 20 MEQ PO PACK
40.0000 meq | PACK | Freq: Once | ORAL | Status: AC
  Administered 2019-04-20: 40 meq via ORAL
  Filled 2019-04-20: qty 2

## 2019-04-20 MED ORDER — SODIUM CHLORIDE 0.9 % IV SOLN
10.0000 mg | Freq: Once | INTRAVENOUS | Status: AC
  Administered 2019-04-20: 10 mg via INTRAVENOUS
  Filled 2019-04-20: qty 1

## 2019-04-20 MED ORDER — LEVALBUTEROL HCL 0.63 MG/3ML IN NEBU
0.6300 mg | INHALATION_SOLUTION | RESPIRATORY_TRACT | Status: DC | PRN
  Administered 2019-04-21 – 2019-04-27 (×8): 0.63 mg via RESPIRATORY_TRACT
  Filled 2019-04-20 (×8): qty 3

## 2019-04-20 NOTE — Progress Notes (Signed)
CRITICAL VALUE ALERT  Critical Value:  K 2.6, Ca 4.9, Phosphorus 1.5  Date & Time Noted: 1940  Provider Notified: Elink  Orders Received/Actions taken: Infuse K Phos once, and recheck CMP to verify Ca and albumin. RN will continue to monitor.

## 2019-04-20 NOTE — Progress Notes (Signed)
eLink Physician-Brief Progress Note Patient Name: Omar Wagner DOB: [birth year] MRN: [id number]   Date of Service  04/20/2019  HPI/Events of Note  Notified of episode of shortness of breath and slight increase in O2 requirement. Given breathing treatment and also given D50 for glucose 60s  eICU Interventions  Patient now appears comfortable with O2 sat 95% on 5L [place]     Intervention Category Major Interventions: Hypoxemia - evaluation and management  Judd Lien 04/20/2019, 6:40 AM

## 2019-04-20 NOTE — Progress Notes (Signed)
eLink Physician-Brief Progress Note Patient Name: Omar Wagner DOB: Dec 20, 1955 MRN: [id number]   Date of Service  04/20/2019  HPI/Events of Note  Notified of K 2.6, Ca 4.9, Phos 1.5 drawn from line. Significantly different from this morning's lab results.  eICU Interventions  Ordered Kphos 20 mmol Repeat CMP before further K and Ca correction if warranted        Shona Needles Martrell Eguia 04/20/2019, 8:02 PM

## 2019-04-20 NOTE — Progress Notes (Addendum)
Nutrition Follow-up  DOCUMENTATION CODES:   Not applicable  INTERVENTION:  - will order Magic Cup TID with meals, each supplement provides 290 kcal and 9 grams of protein. - will order Hormel Shake TID with meals, each supplement provides 500 kcal and 22 grams protein.  - will monitor for further diet advancement.    NUTRITION DIAGNOSIS:   Inadequate oral intake related to inability to eat as evidenced by NPO status. -ongoing  GOAL:   Patient will meet greater than or equal to 90% of their needs -unable to meet at this time.  MONITOR:   Diet advancement, PO intake, Supplement acceptance, Labs, Weight trends  ASSESSMENT:   63 y.o. male with medical history significant of COPD, lung mass, bilateral lymphedema, areas of ulceration to BLE d/t chronic venous insufficiency, squamous cell carcinoma and basal cell carcinoma. He presented to the ED with SOB and cough productive of sputum. Prednisone and inhalers were used PTA but did not provide relief. He has a known lung mass which was seen on CT.  Patient was extubated yesterday and OGT removed at that time. SLP performed bedside swallow evaluation today and diet has now been advanced to FLD, nectar thick. Estimated nutrition needs updated this AM based on extubation.   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 Medications reviewed; sliding scale novolog, 4 g IV Mg sulfate x1 run 12/3, 40 mEq oral KCl x1 dose 12/4, 30 mmol IV KPhos x1 run 12/3.  IVF; D5-1/2 NS @ 50 ml/hr (204 kcal).   Diet Order:   Diet Order            Diet full liquid Room service appropriate? Yes; Fluid consistency: Nectar Thick  Diet effective  now              EDUCATION NEEDS:   No education needs have been identified at this time  Skin:  Skin Assessment: Skin Integrity Issues: Skin Integrity Issues:: DTI DTI: sacrum (new documentation 12/4)  Last BM:  12/3  Height:   Ht Readings from Last 1 Encounters:  04/06/2019 6\' 1"  (1.854 m)    Weight:   Wt Readings from Last 1 Encounters:  04/20/19 91.4 kg    Ideal Body Weight:  83.6 kg  BMI:  Body mass index is 26.58 kg/m.  Estimated Nutritional Needs:   Kcal:  2250-2450 kcal  Protein:  110-120 grams  Fluid:  >/= 2.1 L/day     Jarome Matin, MS, RD, LDN, [facility] Inpatient Clinical Dietitian Pager # [phone] After hours/weekend pager # [phone]

## 2019-04-20 NOTE — Progress Notes (Signed)
Update:  Day 3 will now be given on 04/22/19

## 2019-04-20 NOTE — Progress Notes (Signed)
SLP Cancellation Note  Patient Details Name: ROSA GAMBALE MRN: [id number] DOB: 1956/04/18   Cancelled treatment:       Reason Eval/Treat Not Completed: Patient unavailable: Per RN, pt currently toileting. Will continue efforts when pt is available.  Carmela Rima, [place] Speech Language Pathologist Office: [phone] Pager: [phone]  Shonna Chock 04/20/2019, 8:55 AM

## 2019-04-20 NOTE — Progress Notes (Signed)
Pt has two bracelets on his RUE that are tight d/t edema. Pt refused to let RN remove them. RN will continue to monitor swelling and circulation.

## 2019-04-20 NOTE — Progress Notes (Signed)
Pt. Is now extubated and is looking for his watch and earrings they took them off for MRI 12/1 the RN today was not able to find them. The previous RN who had  Him the day of the MRI said that they must be in MRI. RN called MRI they reported that they did not have them. RN will report to night RN to follow up with MRI one more time.  

## 2019-04-20 NOTE — Progress Notes (Signed)
eLink Physician-Brief Progress Note Patient Name: SOLOMON SKOWRONEK DOB: [birth year] MRN: [id number]   Date of Service  04/20/2019  HPI/Events of Note  Glucose 70s, labs pendng this morning  eICU Interventions  Will need IV Dextrose or NGT placement for feeding if patient remains NPO after swallow eval        Judd Lien 04/20/2019, 4:17 AM

## 2019-04-20 NOTE — Progress Notes (Signed)
Spoke with patient regarding primary contact, pt stated he wanted his sister [name] to be the primary contact in case of emergency. Number in chart.

## 2019-04-20 NOTE — Progress Notes (Addendum)
HEMATOLOGY-ONCOLOGY PROGRESS NOTE  SUBJECTIVE: Extubated 04/19/2019.  Still having some mild shortness of breath.  Denies headaches and vision changes.  Denies chest pain, abdominal pain, nausea, vomiting.  Due for day 1 cycle 1 of carboplatin and etoposide today.  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[place])   04/20/2019 -  Chemotherapy   The patient had palonosetron (ALOXI) injection 0.25 mg, 0.25 mg, Intravenous,  Once, 1 of 4 cycles pegfilgrastim-jmdb (FULPHILA) injection 6 mg, 6 mg, Subcutaneous,  Once, 1 of 4 cycles CARBOplatin (PARAPLATIN) 600 mg in sodium chloride 0.9 % 250 mL chemo infusion, 600 mg (100 % of original dose 601 mg), Intravenous,  Once, 1 of 4 cycles Dose modification:   (original dose 601 mg, Cycle 1) etoposide (VEPESID) 220 mg in sodium chloride 0.9 % 1,000 mL chemo infusion, 100 mg/m2 = 220 mg, Intravenous,  Once, 1 of 4 cycles fosaprepitant (EMEND) 150 mg in sodium chloride 0.9 % 145 mL IVPB, 150 mg, Intravenous,  Once, 1 of 4 cycles atezolizumab (TECENTRIQ) 1,200 mg in sodium chloride 0.9 % 250 mL chemo infusion, 1,200 mg, Intravenous, Once, 0 of 7 cycles  for chemotherapy treatment.    Brain metastases ([place])  04/19/2019 Initial Diagnosis   Brain metastases ([place])   04/20/2019 -  Chemotherapy   The patient had palonosetron (ALOXI) injection 0.25 mg, 0.25 mg, Intravenous,  Once, 1 of 4 cycles pegfilgrastim-jmdb (FULPHILA) injection 6 mg, 6 mg, Subcutaneous,  Once, 1 of 4 cycles CARBOplatin (PARAPLATIN) 600 mg in sodium chloride 0.9 % 250 mL chemo infusion, 600 mg (100 % of original dose 601 mg), Intravenous,  Once, 1 of 4 cycles Dose modification:   (original dose 601 mg, Cycle 1) etoposide (VEPESID) 220 mg in sodium chloride 0.9 % 1,000 mL chemo infusion, 100 mg/m2 = 220 mg, Intravenous,  Once, 1 of 4 cycles fosaprepitant (EMEND) 150 mg in  sodium chloride 0.9 % 145 mL IVPB, 150 mg, Intravenous,  Once, 1 of 4 cycles atezolizumab (TECENTRIQ) 1,200 mg in sodium chloride 0.9 % 250 mL chemo infusion, 1,200 mg, Intravenous, Once, 0 of 7 cycles  for chemotherapy treatment.       REVIEW OF SYSTEMS:   Constitutional: Denies fevers, chills Respiratory: Still reports shortness of breath Cardiovascular: Denies palpitation, chest discomfort Gastrointestinal:  Denies nausea, heartburn or change in bowel habits Skin: Denies abnormal skin rashes Lymphatics: Denies new lymphadenopathy or easy bruising Neurological:Denies numbness, tingling or new weaknesses Behavioral/Psych: Mood is stable, no new changes  All other systems were reviewed with the patient and are negative.  I have reviewed the past medical history, past surgical history, social history and family history with the patient and they are unchanged from previous note.   PHYSICAL EXAMINATION: ECOG PERFORMANCE STATUS: 2 - Symptomatic, <50% confined to bed  Vitals:   04/20/19 0900 04/20/19 1000  BP: (!) 146/71 (!) 153/91  Pulse: (!) 49 (!) 52  Resp: 16 17  Temp:    SpO2: 92% 93%   Filed Weights   04/18/19 0409 04/19/19 0450 04/20/19 0412  Weight: 179 lb 0.2 oz (81.2 kg) 201 lb 8 oz (91.4 kg) 201 lb 8 oz (91.4 kg)    Intake/Output from previous day: 12/03 0701 - 12/04 0700 In: 2139.5 [I.V.:611.5; NG/GT:939.2; IV Piggyback:588.8] Out: 1175 [Urine:1175]  GENERAL: Chronically ill-appearing male, mild accessory muscle use SKIN: Scaly lower extremities EYES: normal, Conjunctiva are pink and  non-injected, sclera clear OROPHARYNX:no exudate, no erythema and lips, buccal mucosa, and tongue normal  NECK: supple, thyroid normal size, non-tender, without nodularity LYMPH:  no palpable lymphadenopathy in the cervical, axillary or inguinal LUNGS: Distant breath sounds on the left, clear on the right HEART: Regular rate, no murmurs ABDOMEN:abdomen soft, non-tender and normal  bowel sounds Musculoskeletal:no cyanosis of digits and no clubbing  NEURO: alert & oriented x 3 with fluent speech, no focal motor/sensory deficits  LABORATORY DATA:  I have reviewed the data as listed CMP Latest Ref Rng & Units 04/20/2019 04/20/2019 04/19/2019  Glucose 70 - 99 mg/dL - 73 -  BUN 8 - 23 mg/dL - 21 -  Creatinine 0.61 - 1.24 mg/dL - 0.79 -  Sodium 135 - 145 mmol/L - 145 -  Potassium 3.5 - 5.1 mmol/L - 3.2(L) -  Chloride 98 - 111 mmol/L - 111 -  CO2 22 - 32 mmol/L - 24 -  Calcium 8.9 - 10.3 mg/dL 7.2(L) 7.2(L) 7.2(L)  Total Protein 6.5 - 8.1 g/dL - 5.0(L) -  Total Bilirubin 0.3 - 1.2 mg/dL - 0.4 -  Alkaline Phos 38 - 126 U/L - 40 -  AST 15 - 41 U/L - 27 -  ALT 0 - 44 U/L - 12 -    Lab Results  Component Value Date   WBC 9.7 04/19/2019   HGB 13.5 04/19/2019   HCT 42.5 04/19/2019   MCV 112.7 (H) 04/19/2019   PLT 106 (L) 04/19/2019   NEUTROABS 6.8 03/29/2019    Dg Abd 1 View  Result Date: 04/15/2019 CLINICAL DATA:  Status post OG tube placement. EXAM: ABDOMEN - 1 VIEW COMPARISON:  None. FINDINGS: OG tube is in place with the tip in the distal stomach. IMPRESSION: As above. Electronically Signed   By: Inge Rise M.D.   On: 04/15/2019 15:51   Ct Chest W Contrast  Result Date: 04/05/2019 CLINICAL DATA:  Shortness of breath and chest pain. History of basal cell carcinoma. EXAM: CT CHEST WITH CONTRAST TECHNIQUE: Multidetector CT imaging of the chest was performed during intravenous contrast administration. CONTRAST:  8mL OMNIPAQUE IOHEXOL 300 MG/ML  SOLN COMPARISON:  01/08/2019.  PET-CT dated 03/01/2019 FINDINGS: Cardiovascular: The heart size is stable from prior study. The main pulmonary artery is dilated measuring approximately 3.6 cm in diameter. There is no aortic aneurysm. No thoracic aortic dissection. Mediastinum/Nodes: --there are new pathologically enlarged mediastinal lymph nodes measuring up to approximately 2.3 cm in the short axis. There are  pathologically enlarged left hilar lymph nodes. --No axillary lymphadenopathy. --No supraclavicular lymphadenopathy. --Normal thyroid gland. --The esophagus is unremarkable Lungs/Pleura: There is new near complete collapse of the left upper lobe. There is consolidation at the left lung apex which may represent postobstructive pneumonia. The left upper lobe bronchus appears to be occluded. There is significant narrowing of the left lower lobe bronchus. There is some consolidation involving the left lower lobe with a small left-sided pleural effusion. The previously demonstrated left upper lobe lung mass appears to have substantially increased in size but is difficult to distinguish from the collapsed lung. An estimate is that the mass mass measures at least 9.2 x 7.6 cm. There are mild emphysematous changes in the right lung field. There is a growing spiculated 1 cm pulmonary nodule in the right upper lobe (axial series 5, image 54). There is a significant amount of debris within the trachea and right mainstem bronchus. There is a new pulmonary opacity in the anterior right upper lobe measuring  approximately 1.7 cm (axial series 5, image 62). There is some atelectasis versus consolidation involving the right lower lobe. Upper Abdomen: There are new hypoattenuating masses in the liver measuring 1.9 cm in hepatic segment 4A and 1.7 cm in hepatic segment 4 B. Musculoskeletal: There is new destruction of the posterior fifth and sixth ribs on the left consistent with osseous involvement of the known lung mass. IMPRESSION: 1. New near complete collapse of the left upper lobe felt to be secondary to progression of the patient's known left upper lobe lung mass, compressing the left upper lobe bronchus. 2. Findings consistent with significant interval progression of disease as evidenced by new pathologically enlarged mediastinal lymph nodes as well as multiple new low-attenuation masses in the liver concerning for hepatic  metastatic disease. In addition, there are new pulmonary opacities in the right upper and right lower lobes raising concern for additional foci of metastatic disease. 3. Consolidation involving the left lung apex concerning for postobstructive pneumonia. 4. Moderate amount of debris within the trachea and bronchi bilaterally. 5. Significant narrowing of the left lower lobe bronchus. There is consolidation involving the left lower lobe with a small left-sided pleural effusion. 6. New osseous involvement involving the posterior fifth and sixth ribs on the left. Electronically Signed   By: Constance Holster M.D.   On: 04/08/2019 17:44   Mr Jeri Cos [place] Contrast  Result Date: 04/17/2019 CLINICAL DATA:  Lung cancer, staging EXAM: MRI HEAD WITHOUT AND WITH CONTRAST TECHNIQUE: Multiplanar, multiecho pulse sequences of the brain and surrounding structures were obtained without and with intravenous contrast. CONTRAST:  47mL GADAVIST GADOBUTROL 1 MMOL/ML IV SOLN COMPARISON:  None. FINDINGS: Brain: There is a 1 cm peripherally enhancing lesion in the inferior left cerebellum (series 11, image 13). There is a 3 mm left cerebellar lesion on image 15. Questionable additional ill-defined enhancing foci in the left cerebellum, which may be artifactual. Suspected 3 mm enhancing lesion adjacent to the right frontal horn (image 32). No significant associated edema. There is no acute infarction or intracranial hemorrhage. There is no hydrocephalus or extra-axial fluid collection. Prominence of the ventricles and sulci reflects mild generalized parenchymal volume loss. Patchy and confluent T2 hyperintensity in the supratentorial and pontine white matter is nonspecific but probably reflects moderate chronic microvascular ischemic changes. Vascular: Major vessel flow voids at the skull base are preserved. Skull and upper cervical spine: Normal marrow signal is preserved. Sinuses/Orbits: Minor mucosal thickening. Bilateral lens  replacements. Other: Sella is unremarkable.  Right mastoid effusion. 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 Suspected small lesion adjacent to the right frontal horn. Additional chronic/nonemergent findings detailed above. Electronically Signed   By: Macy Mis M.D.   On: 04/17/2019 12:27   Ct Biopsy  Result Date: 04/13/2019 INDICATION: Concern for metastatic lung cancer. Please perform CT-guided biopsy of enlarging hypermetabolic soft tissue between the posterior aspect of left sixth and seventh ribs for tissue diagnostic purposes. EXAM: CT-GUIDED BIOPSY OF HYPERMETABOLIC SOFT TISSUE BETWEEN THE POSTERIOR ASPECTS OF THE LEFT SIXTH AND SEVENTH RIBS. COMPARISON:  Chest CT-04/01/2019; 01/08/2019; PET-CT-03/01/2019 MEDICATIONS: None. ANESTHESIA/SEDATION: Fentanyl 100 mcg IV; Versed 2 mg IV Sedation time: 14 minutes; The patient was continuously monitored during the procedure by the interventional radiology nurse under my direct supervision. CONTRAST:  None. COMPLICATIONS: None immediate. PROCEDURE: Informed consent was obtained from the patient following an explanation of the procedure, risks, benefits and alternatives. A time out was performed prior to the initiation of the procedure. The patient was  positioned prone on the CT table and a limited CT was performed for procedural planning demonstrating unchanged size and appearance of the ill-defined infiltrative soft tissue involving the left posterior chest wall between the left sixth and seventh ribs with dominant soft tissue nodule measuring approximately 2.3 x 1.6 cm (image 17, series 2), previously found to be hypermetabolic on preceding PET-CT. The procedure was planned. The operative site was prepped and draped in the usual sterile fashion. Appropriate trajectory was confirmed with a 22 gauge spinal needle after the adjacent tissues were anesthetized with 1% Lidocaine with epinephrine. Under intermittent CT guidance, a 17  gauge coaxial needle was advanced into the peripheral aspect of the mass. Appropriate positioning was confirmed and 5 core needle biopsy samples were obtained with an 18 gauge core needle biopsy device. The co-axial needle was removed and hemostasis was achieved with manual compression. A limited postprocedural CT was negative for pneumothorax, hemorrhage or additional complication. A dressing was placed. The patient tolerated the procedure well without immediate postprocedural complication. IMPRESSION: Technically successful CT guided core needle biopsy of infiltrative hypermetabolic soft tissue nodule between the posterior aspects of the left sixth and seventh ribs. Electronically Signed   By: Sandi Mariscal M.D.   On: 04/13/2019 13:53   Dg Chest Port 1 View  Result Date: 04/20/2019 CLINICAL DATA:  Respiratory failure.  Lung cancer. EXAM: PORTABLE CHEST 1 VIEW COMPARISON:  Chest x-ray 04/19/2019 FINDINGS: The endotracheal tube and NG tubes have been removed. The right PICC line is stable. Stable complete opacification of the left hemithorax. The right lung is clear. IMPRESSION: 1. Removal of ET and NG tubes.  The right PICC line is stable. 2. Persistent complete opacification of the left hemithorax. Electronically Signed   By: Marijo Sanes M.D.   On: 04/20/2019 07:34   Dg Chest Port 1 View  Result Date: 04/19/2019 CLINICAL DATA:  11/29: Episodes of hypertension As well as bradycardia. Alternating with paroxysmal atrial fibrillation with rapid ventricular response/SVT. Increasing oxygen requirements noted cardiology consulted. Started amiodarone, felt not a candidate for pacing. Developed worsening respiratory failure requiring intubation. Underwent bronchoscopy at bedside showing extensive endobronchial tumor burden in the left upper lobe with extrinsic compression on the left lower lobe.-Having recurrent bradycardia so amiodarone held later in the evening, 11/30: Went back into SVT early a.m. hours. Heart  rate 198 205. Amiodarone drip resumed. Also receiving as needed Lopressor. Marked for first XRT12/1 still vent dependent. Having freq atrial arrhythmias but more stable EXAM: PORTABLE CHEST 1 VIEW COMPARISON:  04/18/2019 and earlier exams. FINDINGS: Left hemithorax remains near completely opacified. Mild opacity at the medial right lung base consistent with atelectasis. Right lung otherwise clear. Endotracheal tube, nasal/orogastric tube and right-sided PICC are stable. No pneumothorax. IMPRESSION: 1. No change from previous day's study. 2. Stable support apparatus. Electronically Signed   By: Lajean Manes M.D.   On: 04/19/2019 09:18   Dg Chest Port 1 View  Result Date: 04/18/2019 CLINICAL DATA:  Follow-up exam.  Atelectasis.  Intubated patient. EXAM: PORTABLE CHEST 1 VIEW COMPARISON:  04/17/2019 and earlier exams. FINDINGS: Near complete opacification of the left hemithorax is unchanged from the previous day's exam. Mild opacity at the medial right lung base is noted consistent with atelectasis. Right lung otherwise clear. Endotracheal tube, right PICC and nasal/orogastric tube are stable and well positioned. No pneumothorax. IMPRESSION: 1. No change from the previous day's exam. 2. Persistent near complete opacification of the left hemithorax due to the patient's known mass and postobstructive  changes. 3. Stable well-positioned support apparatus. Electronically Signed   By: Lajean Manes M.D.   On: 04/18/2019 09:09   Dg Chest Port 1 View  Result Date: 04/17/2019 CLINICAL DATA:  Respiratory failure. EXAM: PORTABLE CHEST 1 VIEW COMPARISON:  One-view chest x-ray 04/15/2019. CT chest 04/08/2019 FINDINGS: The heart is obscured. Endotracheal tube is stable. Right-sided PICC line is stable. NG tube courses off the inferior border of the film. Near complete opacification of left hemithorax is stable. The right lung is clear. IMPRESSION: 1. Stable appearance of the chest with near complete opacification of the  left hemithorax reflecting known left lung mass and post obstructive disease. 2. The support apparatus is stable. Electronically Signed   By: San Morelle M.D.   On: 04/17/2019 07:09   Dg Chest Port 1 View  Result Date: 04/15/2019 CLINICAL DATA:  Status post ET tube placement. EXAM: PORTABLE CHEST 1 VIEW COMPARISON:  Chest radiograph 04/15/2019 FINDINGS: Monitoring leads overlie the patient. ET tube mid trachea. Enteric tube courses inferior to the diaphragm. Persistent opacification of the left hemithorax. Minimal right basilar atelectasis. Thoracic spine degenerative changes. IMPRESSION: ET tube mid trachea. Persistent opacification of the left hemithorax. Electronically Signed   By: Lovey Newcomer M.D.   On: 04/15/2019 15:30   Dg Chest Port 1 View  Result Date: 04/15/2019 CLINICAL DATA:  Dyspnea EXAM: PORTABLE CHEST 1 VIEW COMPARISON:  Chest x-rays dated 04/14/2019 04/15/2019. FINDINGS: Continued complete opacification of the LEFT hemithorax. RIGHT lung remains clear. IMPRESSION: 1. Continued complete opacification of the LEFT hemithorax, compatible with previous description of a known malignancy and associated postobstructive atelectasis. 2. RIGHT lung remains clear. Electronically Signed   By: Franki Cabot M.D.   On: 04/15/2019 08:52   Dg Chest Port 1 View  Result Date: 04/14/2019 CLINICAL DATA:  Shortness of breath EXAM: PORTABLE CHEST 1 VIEW COMPARISON:  Chest radiograph 03/30/2019 FINDINGS: Monitoring leads overlie the patient. Interval worsening complete opacification left hemithorax. Minimal right basilar atelectasis. Thoracic spine degenerative changes. IMPRESSION: Interval worsening of opacification of the left hemithorax most compatible with known malignancy and associated postobstructive atelectasis. Electronically Signed   By: Lovey Newcomer M.D.   On: 04/14/2019 17:39   Dg Chest Port 1 View  Result Date: 04/05/2019 CLINICAL DATA:  Patient reports to the ED with complaint of SOB  and chest pain. Patient has a hx of lymphoma and reports he has been receiving care. [name] legs have an odor and his skin is dry and flaky EXAM: PORTABLE CHEST 1 VIEW COMPARISON:  10/27/2018.  PET-CT, 03/01/2019. FINDINGS: Near complete opacification of the left hemithorax. This has developed since prior chest radiograph, and since the more recent prior PET-CT. The opacification is consistent combination of the posterior left upper lobe hypermetabolic mass noted on the prior PET-CT in combination atelectasis or postobstructive consolidation. Pleural fluid is also suspected. Right lung is hyperexpanded, but otherwise clear. No right pleural effusion. No pneumothorax. IMPRESSION: 1. Significant interval worsening left lung aeration. Left hemithorax is now mostly opacified consistent with a combination of the presumed left upper lobe malignancy with postobstructive change/atelectasis and probable pleural fluid. Electronically Signed   By: Lajean Manes M.D.   On: 03/18/2019 14:25   [place] Ekg Site Rite  Result Date: 04/16/2019 If Site Rite image not attached, placement could not be confirmed due to current cardiac rhythm.   ASSESSMENT AND PLAN: This is a 63 year old male with:  1.  Extensive stage small cell lung cancer -The patient was noted to have  a possible mass on chest x-ray from 10/27/2018. -CT of the chest performed on 01/08/2019 demonstrated a 5.9 cm mass in the posterior left upper lobe possible lymphangitic spread and small mediastinal lymph nodes -PET scan performed on 03/01/2019 demonstrated a hypermetabolic 8.5 x 4.8 cm in the posterior left upper lobe of the lung with direct peripheral left stool invasion between the lateral left fifth and sixth ribs and direct left paraspinal invasion at the T4 level.  Additionally there were also hypermetabolic ipsilateral mediastinal lymph nodes, hypermetabolic distant nodal metastasis in the left external iliac chain, hypermetabolic posterior left chest  wall metastasis between the left sixth and seventh ribs, and hypermetabolic osseous metastases to the left sixth rib. -The patient was admitted to the hospital on 03/20/2019 and a CT of the chest showed new near complete collapse of the left upper lobe secondary to progression of the patient's known left upper lobe lung mass, compressing the left upper lobe bronchus.  There also multiple new low-attenuation masses in the liver concerning for hepatic metastatic disease, progression in the mediastinal lymph nodes, and new pulmonary opacities in the right upper and right lower lobes. -CT-guided biopsy of the hypermetabolic left posterior chest wall nodule performed on 04/13/2019 demonstrated small cell carcinoma. -The patient initiated radiation to the left lung mass on 04/16/2019. -MRI of the brain on 04/17/2019 demonstrated at least 2 small left cerebellar metastatic lesions and suspected small lesion adjacent to the right frontal horn. -The patient will continue radiation to the left lung mass under the care of Dr. Lisbeth Renshaw. -We will plan to begin day 1 of cycle 1 of carboplatin and etoposide today.  Adverse effects of this treatment have been discussed with the patient including but not limited to myelosuppression, nausea and vomiting, alopecia, nephrotoxicity, hepatotoxicity.  The patient is agreeable to proceed.  Due to staffing issues, will proceed with day 1 today and then administer day 2 and day 3 on Monday and Tuesday of next week. -Brain metastases may respond to systemic chemotherapy.  He has no neurological symptoms.  Discussed with radiation oncology and they will hold off on radiation to the brain at this time.  They will revisit this at a later date.  2.  Acute hypoxic respiratory failure due to also lung cancer with associated postobstructive atelectasis/pneumonia and COPD exacerbation -Respiratory status slowly improving -Ongoing management per PCCM  3.  Mild thrombocytopenia  4.   Hypercalcemia of malignancy -Calcium was 14.1 on admission and now corrected -Status post zoledronic acid on 04/15/2019 and he also received calcitonin and IV hydration -We will monitor calcium closely and plan to repeat zoledronic acid as an outpatient  5.  Sinus tachycardia/SVT -On amiodarone -Not currently on anticoagulation secondary to brain mets  6.  Chronic lymphedema -Followed by wound clinic as an outpatient    LOS: 8 days   Mikey Bussing, DNP, AGPCNP-BC, AOCNP 04/20/19   ADDENDUM  .Patient was Personally and independently interviewed, examined and relevant elements of the history of present illness were reviewed in details and an assessment and plan was created. All elements of the patient's history of present illness , assessment and plan were discussed in details with Mikey Bussing, DNP. The above documentation reflects our combined findings assessment and plan.  Patient resting comfortably in bed.  No acute regular toxicities from cycle 1 day 1 of carboplatin etoposide. Discussed with chemotherapy nursing staff.  Patient will be getting day 2 etoposide tomorrow/Saturday and day 3 on Monday based on nursing staffing. No other  acute new symptoms.  Chest x-ray still showing left-sided whiteout. Appreciate excellent care by critical care nursing and physician team. Oncology service will continue to follow. Granix for 5 days starting day 4 of cycle 1.  Sullivan Lone MD MS

## 2019-04-20 NOTE — Evaluation (Signed)
Clinical/Bedside Swallow Evaluation Patient Details  Name: Omar Wagner MRN: [id number] Date of Birth: 1956/03/13  Today's Date: 04/20/2019 Time: SLP Start Time (ACUTE ONLY): 1015 SLP Stop Time (ACUTE ONLY): 1055 SLP Time Calculation (min) (ACUTE ONLY): 40 min  Past Medical History:  Past Medical History:  Diagnosis Date  . Basal cell carcinoma   . Brain metastasis ([place])   . Chronic venous insufficiency   . COPD (chronic obstructive pulmonary disease) ([place])   . Depression   . History of heroin use   . Lung mass   . Lymphedema   . Paroxysmal SVT (supraventricular tachycardia) ([facility])    Past Surgical History:  Past Surgical History:  Procedure Laterality Date  . HERNIA REPAIR     HPI:  63yo male admitted 04/07/2019 with SOB. PMH: severe COPD, lung mass with brain/bone mets. 04/15/2019 declined status, to ICU intubated due to acute hypoxemic respiratory failure d/t post-obstructive PNA, LUL compression/collapse from Left lung mass.  intubated 11/29 - 12/3 CXR = Stable complete opacification of the left hemithorax. The right lung is clear.   Assessment / Plan / Recommendation Clinical Impression  Pt presents with significantly high risk of aspiration, due to marked SOB and increased WOB, severe COPD, intubation x4 days, and generalized weakness.   Pt completed oral care with suction after set up. He has dentures at home, and reports tolerating regular solids and thin liquids with or without dentures in place. Pt accepted trials of thin liquid, nectar thick liquid, puree, and solid textures. Pt O2 sats remained in the low 90's throughout this evaluation, with continuous shortness of breath evident. Weak congested nonproductive cough was noted following trials of thin liquid, and pt was unable to pass the 3oz water challenge.   Given current presentation, recommend cautiously providing full liquids, thickened to nectar consistency, for energy conservation, to mitigate aspiration risk, and  provide oral satisfaction for quality of life - if within pt/family wishes. Pt should be upright and awake for po intake, and frequent thorough oral care is paramount to minimize bacterial load. Still, even with strict adherence to all safety precautions, risk for (silent) aspiration continues to be a very clear and present risk.   Palliative Care consult may be beneficial, given pt's tenuous medical status.    SLP Visit Diagnosis: Dysphagia, unspecified (R13.10)    Aspiration Risk  Severe aspiration risk;Risk for inadequate nutrition/hydration    Diet Recommendation Other (Comment);Nectar-thick liquid(full liquid, thickened to nectar consistency)   Liquid Administration via: Cup;Straw;Spoon Medication Administration: Crushed with puree Supervision: Patient able to self feed;Staff to assist with self feeding;Full supervision/cueing for compensatory strategies Compensations: Minimize environmental distractions;Small sips/bites;Slow rate Postural Changes: Seated upright at 90 degrees;Remain upright for at least 30 minutes after po intake    Other  Recommendations Oral Care Recommendations: Oral care before and after PO Other Recommendations: Order thickener from pharmacy;Have oral suction available   Follow up Recommendations Other (comment)(TBD)      Frequency and Duration min 2x/week  1 week;2 weeks       Prognosis Prognosis for Safe Diet Advancement: Guarded Barriers to Reach Goals: Severity of deficits      Swallow Study   General Date of Onset: 04/02/2019 HPI: 63yo male admitted 04/16/2019 with SOB. PMH: severe COPD, lung mass with brain/bone mets. 04/15/2019 declined status, to ICU intubated due to acute hypoxemic respiratory failure d/t post-obstructive PNA, LUL compression/collapse from Left lung mass.  intubated 11/29 - 12/3 CXR = Stable complete opacification of the left hemithorax. The  right lung is clear. Type of Study: Bedside Swallow Evaluation Previous Swallow  Assessment: none Diet Prior to this Study: NPO Temperature Spikes Noted: No Respiratory Status: Nasal cannula History of Recent Intubation: Yes Length of Intubations (days): 4 days Date extubated: 04/19/19 Behavior/Cognition: Alert;Cooperative;Pleasant mood Oral Cavity Assessment: Within Functional Limits Oral Care Completed by SLP: Yes Oral Cavity - Dentition: Missing dentition;Poor condition Vision: Functional for self-feeding Self-Feeding Abilities: Able to feed self;Needs assist;Needs set up Patient Positioning: Upright in bed Baseline Vocal Quality: Low vocal intensity Volitional Cough: Weak;Congested Volitional Swallow: Able to elicit    Oral/Motor/Sensory Function Overall Oral Motor/Sensory Function: Generalized oral weakness   Ice Chips Ice chips: Not tested   Thin Liquid Thin Liquid: Impaired Pharyngeal  Phase Impairments: Cough - Immediate;Cough - Delayed    Nectar Thick Nectar Thick Liquid: Within functional limits Presentation: Straw   Honey Thick Honey Thick Liquid: Not tested   Puree Puree: Within functional limits Presentation: Self Fed   Solid     Solid: Impaired Oral Phase Functional Implications: Prolonged oral transit     Enriqueta Shutter, [place], [place] Pathologist Office: [phone] Pager: [phone]  Shonna Chock 04/20/2019,11:07 AM

## 2019-04-20 NOTE — Progress Notes (Signed)
0400: CBG in 75, pt not symptomatic, E-Link notified. Labs pending, will continue to monitor.  0500:Glucose on labs came back at 33, E-Link notified.  30: RN called to room, pt stated SOB. SpO2 at 88%, 02 increased to 5L via [place], spO2 up to 92%. Respiratory notified. CBG checked-67. D50 given per protocol. E-Link notified. RT in to give treatment. Will continue to monitor.

## 2019-04-20 NOTE — Progress Notes (Signed)
Will give day 3 on 04/23/19 Dr Irene Limbo plans granix x 5 days (instead of peg-filgrastim on day 5)

## 2019-04-20 NOTE — Progress Notes (Signed)
NAME:  Omar Wagner, MRN:  [id number], DOB:  1955-09-10, LOS: 8 ADMISSION DATE:  04/01/2019, CONSULTATION DATE:  04/13/19 REFERRING MD: Louellen Molder, MD CHIEF COMPLAINT:  Lung mass with mets  Brief History   63 year old male with very severe COPD (FEV1 33%) who presents with acute hypoxemic respiratory failure secondary to post-obstructive pneumonia in setting of LUL compression/collapse from left lung mass .  Past Medical History  Very severe COPD (FEV1 33%) in 2018 Depression Hx basal cell [facility] Events   11/26 Admitted 11/27 CT guided biopsy of soft tissue nodule in the left posterior chest wall 11/28 pulmonary consulted.  Recommended routine bronchodilator therapy antibiotics and prednisone taper 11/29: Episodes of hypertension As well as bradycardia.  Alternating with paroxysmal atrial fibrillation with rapid ventricular response/SVT.  Increasing oxygen requirements noted cardiology consulted.  Started amiodarone, felt not a candidate for pacing.  Developed worsening respiratory failure requiring intubation.  Underwent bronchoscopy at bedside showing extensive endobronchial tumor burden in the left upper lobe with extrinsic compression on the left lower lobe. -Having recurrent bradycardia so amiodarone held later in the evening, 11/30: Went back into SVT early a.m. hours.  Heart rate 198 205.  Amiodarone drip resumed.  Also receiving as needed Lopressor.  Marked for first XRT 12/1 still vent dependent. Having freq atrial arrhythmias but more stable. Weaning.  12/3 extubated  Consults:  TRH IR PCCM Rad/Onc  Procedures:  PICC 11/30>  Significant Diagnostic Tests:  PET 03/01/19 1. Intensely hypermetabolic (max SUV 73.2) large poorly marginated 8.5 x 4.8 cm posterior left upper lobe lung mass with direct peripheral left chest wall invasion between the lateral left fifth and sixth ribs and direct left paraspinal invasion at the T4 level,  significantly increased in size since 01/08/2019 chest CT, compatible with aggressive locally advanced primary bronchogenic carcinoma. 2. Hypermetabolic ipsilateral mediastinal lymph node metastases in the AP window and left paraesophageal chains. 3. Hypermetabolic distant nodal metastasis in the left external iliac chain. 4. Hypermetabolic posterior left chest wall metastasis between left sixth and seventh ribs. 5. Hypermetabolic osseous metastases to the left sixth rib. 6. PET-CT stage IVB (T4 N2 M1c). 7. New 5 mm right upper lobe pulmonary nodule and slight growth of 4 mm right middle lobe pulmonary nodule, below PET resolution, cannot exclude pulmonary metastases. 8. Aortic Atherosclerosis (ICD10-I70.0) and Emphysema (ICD10-J43.9).  CT Chest W Contrast 03/28/2019 1. New near complete collapse of the left upper lobe felt to be secondary to progression of the patient's known left upper lobe lung mass, compressing the left upper lobe bronchus. 2. Findings consistent with significant interval progression of disease as evidenced by new pathologically enlarged mediastinal lymph nodes as well as multiple new low-attenuation masses in the liver concerning for hepatic metastatic disease. In addition, there are new pulmonary opacities in the right upper and right lower lobes raising concern for additional foci of metastatic disease. 3. Consolidation involving the left lung apex concerning for postobstructive pneumonia. 4. Moderate amount of debris within the trachea and bronchi bilaterally. 5. Significant narrowing of the left lower lobe bronchus. There is consolidation involving the left lower lobe with a small left-sided pleural effusion. 6. New osseous involvement involving the posterior fifth and sixth ribs on the left.  2d ECHO 04/15/19 1. Left ventricular ejection fraction, by visual estimation, is 60 to 65%. The left ventricle has normal function. Left ventricular septal wall  thickness was mildly increased. Mildly increased left ventricular posterior wall thickness. There is mildly  increased left ventricular hypertrophy.  2. Patient was extremely tachycardic ranging from 100 to 200 bpm. Left ventricular function appears to be normal.  3. Global right ventricle has normal systolic function.The right ventricular size is normal. No increase in right ventricular wall thickness.  4. Left atrial size was mildly dilated.  5. Right atrial size was severely dilated.  6. The mitral valve is normal in structure. No evidence of mitral valve regurgitation. No evidence of mitral stenosis.  7. The tricuspid valve is normal in structure. Tricuspid valve regurgitation is not demonstrated.  8. The aortic valve is normal in structure. Aortic valve regurgitation is not visualized. No evidence of aortic valve sclerosis or stenosis.  9. The pulmonic valve was normal in structure. Pulmonic valve regurgitation is not visualized. 10. TR signal is inadequate for assessing pulmonary artery systolic pressure. 11. The inferior vena cava is dilated in size with <50% respiratory variability, suggesting right atrial pressure of 15 mmHg.  MRI brain 12/1 >> at least 2 small left cerebellar metastatic lesions, possible small metastatic lesion adjacent to the right frontal horn  Micro Data:  BCx 03/20/2019 >> negative Sputum Cx 04/13/19 >> not done  Antimicrobials:  Vanc 11/26>stopped Cefepime 11/26>stopped Ceftriaxone 11/26>stopped Azithro 11/26>stopped Zosyn 11/26> 12/4  Interim history/subjective:  Underwent bronchoscopy yesterday prior to successful extubation. Had periods of hypoglycemia and some dyspnea overnight, received D50, received albuterol Also received Benadryl for sleep overnight Hypokalemia this morning, hypoglycemia this morning Appreciate oncology and radiation oncology evaluations, third XRT 12/3 planning for another treatment today   Objective   Blood pressure 131/67,  pulse (!) 51, temperature (!) 97.4 F (36.3 C), temperature source Oral, resp. rate 16, height 6\' 1"  (1.854 m), weight 91.4 kg, SpO2 90 %.    Vent Mode: PSV;CPAP FiO2 (%):  [30 %] 30 % Set Rate:  [20 bmp-28 bmp] 20 bmp Vt Set:  [470 mL] 470 mL PEEP:  [5 cmH20-8 cmH20] 8 cmH20 Pressure Support:  [5 cmH20-8 cmH20] 8 cmH20 Plateau Pressure:  [20 cmH20] 20 cmH20   Intake/Output Summary (Last 24 hours) at 04/20/2019 0911 Last data filed at 04/20/2019 0800 Gross per 24 hour  Intake 1786.92 ml  Output 1175 ml  Net 611.92 ml   Filed Weights   04/18/19 0409 04/19/19 0450 04/20/19 0412  Weight: 81.2 kg 91.4 kg 91.4 kg   Physical Exam:   General: Chronically ill-appearing gentleman, on supplemental oxygen, mild accessory muscle use HEENT oropharynx clear, pupils equal Pulmonary: No breath sounds on the left, distant but clear on the right without wheezes, using some accessory muscles to breathe but able to carry on a conversation Cardiac: Irregular without a murmur Abdomen: Distended, nontender with positive bowel sounds Extremities: Scaly lower extremity edema changes Neuro: Awake, interacting, following commands, answering questions appropriately, well oriented  Resolved Hospital Problem list   VDRF  Assessment & Plan:  63 y/o male with severe COPD (FEV1 33%) admitted with COPD exacerbation secondary to postobstructive pneumonia in the setting of left lung mass   Acute hypoxic and hypercarbic respiratory failure in the setting left upper lobe lung mass, complete left atelectasis Small cell lung cancer with associated postobstructive atelectasis/pneumonia COPD exacerbation, improving  Plan Planning for XRT treatment #4 to the lung today, brain radiation to be deferred in preparation for systemic chemotherapy today 12/4. 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  intermittent tachycardia/MAT.  Currently bradycardia but tolerating amiodarone.   Plan Goal discontinue amiodarone soon, unclear that he would need this on a chronic basis.  Suspect that his RVR was stress-induced from his respiratory failure Hold off on anticoagulation given his brain metastases Telemetry monitoring  Hyperglycemia (? Steroid induced?), Hgb A1c 5.9 Now with hypoglycemia Plan Sliding-scale insulin Start D5 0.45 NS 50 cc/h for 1 day Swallow evaluation pending, goal initiate nutrition soon  Venous stasis dermatitis involving bilateral lower extremities Plan Appreciate WOC, continue Eucerin bilateral lower extremities  Fluid and electrolyte imbalance: Hypokalemia, hypomagnesemia,Hypophosphatemia Plan  Replace potassium 12/4 Follow BMP, calcium, phosphorus, especially following chemotherapy with risk for tumor lysis  Mild thrombocytopenia -Mildly improved Plan Follow CBC  Best practice:  Diet: N.p.o., swallow evaluation pending Pain/Anxiety/Delirium protocol (if indicated): Stopped 12/3 VAP protocol (if indicated): Completed DVT prophylaxis: LMWH started 11/29 GI prophylaxis: PPI Glucose control: SSI Mobility: Out of bed with assist, physical therapy Code Status: I spoke with patient 12/4 regarding his underlying disease, prognosis, the risk for recurrent respiratory failure.  I have recommended against reintubation as I do not believe he would survive it to extubation or meaningful recovery.  He understands and agrees.  We will defer MV, CPR but he is open to consider all possible medical therapies and interventions that may help him improve, enhance his quality of life, give him a chance to be discharged out of the hospital to home.  This includes his current plans for radiation, chemotherapy.  Also would include possible  BiPAP if it became indicated. Family Communication: Discussed with patient in full.  Sister Homero Fellers updated on 12/3. Disposition: Continue ICU monitoring this morning given labile respiratory status.  Hopefully to stepdown status either today or 12/5  Independent critical care time 32 minutes  Baltazar Apo, MD, PhD 04/20/2019, 9:27 AM [facility] [phone] or if no answer [phone]

## 2019-04-21 DIAGNOSIS — Z5111 Encounter for antineoplastic chemotherapy: Secondary | ICD-10-CM

## 2019-04-21 DIAGNOSIS — C7931 Secondary malignant neoplasm of brain: Secondary | ICD-10-CM | POA: Diagnosis not present

## 2019-04-21 DIAGNOSIS — C3490 Malignant neoplasm of unspecified part of unspecified bronchus or lung: Secondary | ICD-10-CM | POA: Diagnosis not present

## 2019-04-21 LAB — CBC
HCT: 41.9 % (ref 39.0–52.0)
Hemoglobin: 13.3 g/dL (ref 13.0–17.0)
MCH: 36.2 pg — ABNORMAL HIGH (ref 26.0–34.0)
MCHC: 31.7 g/dL (ref 30.0–36.0)
MCV: 114.2 fL — ABNORMAL HIGH (ref 80.0–100.0)
Platelets: 144 10*3/uL — ABNORMAL LOW (ref 150–400)
RBC: 3.67 MIL/uL — ABNORMAL LOW (ref 4.22–5.81)
RDW: 12.3 % (ref 11.5–15.5)
WBC: 9 10*3/uL (ref 4.0–10.5)
nRBC: 0 % (ref 0.0–0.2)

## 2019-04-21 LAB — GLUCOSE, CAPILLARY
Glucose-Capillary: 115 mg/dL — ABNORMAL HIGH (ref 70–99)
Glucose-Capillary: 141 mg/dL — ABNORMAL HIGH (ref 70–99)
Glucose-Capillary: 163 mg/dL — ABNORMAL HIGH (ref 70–99)
Glucose-Capillary: 168 mg/dL — ABNORMAL HIGH (ref 70–99)
Glucose-Capillary: 171 mg/dL — ABNORMAL HIGH (ref 70–99)
Glucose-Capillary: 174 mg/dL — ABNORMAL HIGH (ref 70–99)
Glucose-Capillary: 182 mg/dL — ABNORMAL HIGH (ref 70–99)

## 2019-04-21 LAB — BASIC METABOLIC PANEL
Anion gap: 9 (ref 5–15)
Anion gap: 8 (ref 5–15)
BUN: 17 mg/dL (ref 8–23)
BUN: 17 mg/dL (ref 8–23)
CO2: 23 mmol/L (ref 22–32)
CO2: 22 mmol/L (ref 22–32)
Calcium: 7 mg/dL — ABNORMAL LOW (ref 8.9–10.3)
Calcium: 7 mg/dL — ABNORMAL LOW (ref 8.9–10.3)
Chloride: 112 mmol/L — ABNORMAL HIGH (ref 98–111)
Chloride: 113 mmol/L — ABNORMAL HIGH (ref 98–111)
Creatinine, Ser: 0.74 mg/dL (ref 0.61–1.24)
Creatinine, Ser: 0.72 mg/dL (ref 0.61–1.24)
GFR calc Af Amer: >60 mL/min (ref >60)
GFR calc Af Amer: >60 mL/min (ref >60)
GFR calc non Af Amer: >60 mL/min (ref >60)
GFR calc non Af Amer: >60 mL/min (ref >60)
Glucose, Bld: 180 mg/dL — ABNORMAL HIGH (ref 70–99)
Glucose, Bld: 178 mg/dL — ABNORMAL HIGH (ref 70–99)
Potassium: 4.1 mmol/L (ref 3.5–5.1)
Potassium: 3.9 mmol/L (ref 3.5–5.1)
Sodium: 144 mmol/L (ref 135–145)
Sodium: 143 mmol/L (ref 135–145)

## 2019-04-21 LAB — PHOSPHORUS
Phosphorus: 3.3 mg/dL (ref 2.5–4.6)
Phosphorus: 2.4 mg/dL — ABNORMAL LOW (ref 2.5–4.6)

## 2019-04-21 LAB — LACTIC ACID, PLASMA: Lactic Acid, Venous: 1.5 mmol/L (ref 0.5–1.9)

## 2019-04-21 MED ORDER — POTASSIUM PHOSPHATES 15 MMOLE/5ML IV SOLN
10.0000 mmol | Freq: Once | INTRAVENOUS | Status: AC
  Administered 2019-04-22: 10 mmol via INTRAVENOUS
  Filled 2019-04-21: qty 3.33

## 2019-04-21 MED ORDER — FENTANYL CITRATE (PF) 100 MCG/2ML IJ SOLN
25.0000 ug | INTRAMUSCULAR | Status: DC | PRN
  Administered 2019-04-21: 21:00:00 25 ug via INTRAVENOUS
  Administered 2019-04-22 – 2019-04-23 (×11): 50 ug via INTRAVENOUS
  Filled 2019-04-21 (×12): qty 2

## 2019-04-21 MED ORDER — CALCIUM GLUCONATE-NACL 1-0.675 GM/50ML-% IV SOLN
1.0000 g | Freq: Once | INTRAVENOUS | Status: AC
  Administered 2019-04-21: 1000 mg via INTRAVENOUS
  Filled 2019-04-21: qty 50

## 2019-04-21 NOTE — Progress Notes (Signed)
NAME:  Omar Wagner, MRN:  [id number], DOB:  1955-08-30, LOS: 9 ADMISSION DATE:  04/02/2019, CONSULTATION DATE:  04/13/19 REFERRING MD: Louellen Molder, MD CHIEF COMPLAINT:  Lung mass with mets  Brief History   63 year old male with very severe COPD (FEV1 33%) who presents with acute hypoxemic respiratory failure secondary to post-obstructive pneumonia in setting of LUL compression/collapse from left lung mass .  Past Medical History  Very severe COPD (FEV1 33%) in 2018 Depression Hx basal cell [facility] Events   11/26 Admitted 11/27 CT guided biopsy of soft tissue nodule in the left posterior chest wall 11/28 pulmonary consulted.  Recommended routine bronchodilator therapy antibiotics and prednisone taper 11/29: Episodes of hypertension As well as bradycardia.  Alternating with paroxysmal atrial fibrillation with rapid ventricular response/SVT.  Increasing oxygen requirements noted cardiology consulted.  Started amiodarone, felt not a candidate for pacing.  Developed worsening respiratory failure requiring intubation.  Underwent bronchoscopy at bedside showing extensive endobronchial tumor burden in the left upper lobe with extrinsic compression on the left lower lobe. -Having recurrent bradycardia so amiodarone held later in the evening, 11/30: Went back into SVT early a.m. hours.  Heart rate 198 205.  Amiodarone drip resumed.  Also receiving as needed Lopressor.  Marked for first XRT 12/1 still vent dependent. Having freq atrial arrhythmias but more stable. Weaning.  12/3 extubated  Consults:  TRH IR PCCM Rad/Onc  Procedures:  PICC 11/30>  Significant Diagnostic Tests:  PET 03/01/19 1. Intensely hypermetabolic (max SUV 80.9) large poorly marginated 8.5 x 4.8 cm posterior left upper lobe lung mass with direct peripheral left chest wall invasion between the lateral left fifth and sixth ribs and direct left paraspinal invasion at the T4 level,  significantly increased in size since 01/08/2019 chest CT, compatible with aggressive locally advanced primary bronchogenic carcinoma. 2. Hypermetabolic ipsilateral mediastinal lymph node metastases in the AP window and left paraesophageal chains. 3. Hypermetabolic distant nodal metastasis in the left external iliac chain. 4. Hypermetabolic posterior left chest wall metastasis between left sixth and seventh ribs. 5. Hypermetabolic osseous metastases to the left sixth rib. 6. PET-CT stage IVB (T4 N2 M1c). 7. New 5 mm right upper lobe pulmonary nodule and slight growth of 4 mm right middle lobe pulmonary nodule, below PET resolution, cannot exclude pulmonary metastases. 8. Aortic Atherosclerosis (ICD10-I70.0) and Emphysema (ICD10-J43.9).  CT Chest W Contrast 04/14/2019 1. New near complete collapse of the left upper lobe felt to be secondary to progression of the patient's known left upper lobe lung mass, compressing the left upper lobe bronchus. 2. Findings consistent with significant interval progression of disease as evidenced by new pathologically enlarged mediastinal lymph nodes as well as multiple new low-attenuation masses in the liver concerning for hepatic metastatic disease. In addition, there are new pulmonary opacities in the right upper and right lower lobes raising concern for additional foci of metastatic disease. 3. Consolidation involving the left lung apex concerning for postobstructive pneumonia. 4. Moderate amount of debris within the trachea and bronchi bilaterally. 5. Significant narrowing of the left lower lobe bronchus. There is consolidation involving the left lower lobe with a small left-sided pleural effusion. 6. New osseous involvement involving the posterior fifth and sixth ribs on the left.  2d ECHO 04/15/19 1. Left ventricular ejection fraction, by visual estimation, is 60 to 65%. The left ventricle has normal function. Left ventricular septal wall  thickness was mildly increased. Mildly increased left ventricular posterior wall thickness. There is mildly  increased left ventricular hypertrophy.  2. Patient was extremely tachycardic ranging from 100 to 200 bpm. Left ventricular function appears to be normal.  3. Global right ventricle has normal systolic function.The right ventricular size is normal. No increase in right ventricular wall thickness.  4. Left atrial size was mildly dilated.  5. Right atrial size was severely dilated.  6. The mitral valve is normal in structure. No evidence of mitral valve regurgitation. No evidence of mitral stenosis.  7. The tricuspid valve is normal in structure. Tricuspid valve regurgitation is not demonstrated.  8. The aortic valve is normal in structure. Aortic valve regurgitation is not visualized. No evidence of aortic valve sclerosis or stenosis.  9. The pulmonic valve was normal in structure. Pulmonic valve regurgitation is not visualized. 10. TR signal is inadequate for assessing pulmonary artery systolic pressure. 11. The inferior vena cava is dilated in size with <50% respiratory variability, suggesting right atrial pressure of 15 mmHg.  MRI brain 12/1 >> at least 2 small left cerebellar metastatic lesions, possible small metastatic lesion adjacent to the right frontal horn  Micro Data:  BCx 03/26/2019 >> negative Sputum Cx 04/13/19 >> not done  Antimicrobials:  Vanc 11/26>stopped Cefepime 11/26>stopped Ceftriaxone 11/26>stopped Azithro 11/26>stopped Zosyn 11/26> 12/4  Interim history/subjective:   Undergoing XRT, first chemotherapy was 12/4 SLP recommends nectar thick liquid and a full liquid diet (thickened)   Objective   Blood pressure 140/80, pulse 88, temperature 99.4 F (37.4 C), temperature source Axillary, resp. rate (!) 26, height 6\' 1"  (1.854 m), weight 91.7 kg, SpO2 92 %.        Intake/Output Summary (Last 24 hours) at 04/21/2019 0846 Last data filed at 04/21/2019 0548  Gross per 24 hour  Intake 3190.06 ml  Output 1130 ml  Net 2060.06 ml   Filed Weights   04/19/19 0450 04/20/19 0412 04/21/19 0401  Weight: 91.4 kg 91.4 kg 91.7 kg   Physical Exam:   General: Chronically ill-appearing gentleman, more comfortable today, no respiratory distress HEENT oropharynx clear, pupils equal Pulmonary: No breath sounds on the left, clear on the right, distant, no wheeze Cardiac: Regular, no murmur Abdomen: Soft, nondistended, positive bowel sounds Extremities: Scaly chronic edematous changes bilateral lower extremities Neuro: Awake, alert, interacting, follows commands, answers questions appropriately, well oriented  Resolved Hospital Problem list   VDRF  Assessment & Plan:  62 y/o male with severe COPD (FEV1 33%) admitted with COPD exacerbation secondary to postobstructive pneumonia in the setting of left lung mass   Acute hypoxic and hypercarbic respiratory failure in the setting left upper lobe lung mass, complete left atelectasis Extensive stage small cell lung cancer with associated postobstructive atelectasis/pneumonia COPD exacerbation, improved Plan Appreciate radiation oncology, oncology management.  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  intermittent tachycardia/MAT.  Back in NSR 12/5 Plan Stop amiodarone 12/5 Holding anticoagulation Telemetry monitoring  Hyperglycemia (? Steroid induced?), Hgb A1c 5.9 Now with hypoglycemia Plan Sliding-scale insulin coverage Start D5 0.45 NS 50 cc/h, should be able to stop 12/5 now that he is taking p.o. Swallow evaluation recommended thickened full liquid diet  Venous stasis dermatitis involving bilateral  lower extremities Plan Appreciate WOC, continue Eucerin bilateral lower extremities  Fluid and electrolyte imbalance: Hypokalemia, hypomagnesemia,Hypophosphatemia Plan  Continue to follow BMP, calcium, phosphorus with the initiation of chemotherapy.  Watch for tumor lysis  Mild thrombocytopenia -Mildly improved Plan Follow CBC  Best practice:  Diet: Full liquid diet, thickened, suspect we can advance soon Pain/Anxiety/Delirium protocol (if indicated): Stopped 12/3 VAP protocol (if indicated): Completed DVT prophylaxis: LMWH started 11/29 GI prophylaxis: PPI Glucose control: SSI Mobility: Out of bed with assist, physical therapy Code Status: I spoke with patient 12/4 regarding his underlying disease, prognosis, the risk for recurrent respiratory failure.  I have recommended against reintubation as I do not believe he would survive it to extubation or meaningful recovery.  He understands and agrees.  We will defer MV, CPR but he is open to consider all possible medical therapies and interventions that may help him improve, enhance his quality of life, give him a chance to be discharged out of the hospital to home.  This includes his current plans for radiation, chemotherapy.  Also would include possible BiPAP if it became indicated. Family Communication: Discussed with patient in full.  Disposition: Change stepdown status 12/5.  No plans to transfer out of the SDU while we are waiting to see how he tolerates chemotherapy    Baltazar Apo, MD, PhD 04/21/2019, 8:46 AM  Pulmonary and Critical Care [phone] or if no answer [phone]

## 2019-04-21 NOTE — Progress Notes (Addendum)
eLink Physician-Brief Progress Note Patient Name: CLAYVON PARLETT DOB: [birth year] MRN: [id number]   Date of Service  04/21/2019  HPI/Events of Note  Pain - No relief with Tylenol. BP = 151/52.   eICU Interventions  Will order: 1. Fentanyl 25-50 mcg IV Q 2 hours PRN pain.      Intervention Category Major Interventions: Other:  Lysle Dingwall 04/21/2019, 8:58 PM

## 2019-04-21 NOTE — Consult Note (Addendum)
WOC Nurse Consult Note: Reason for Consult: Deep tissue pressure injury to sacrum Wound type: Pressure Pressure Injury POA: No Measurement: 2cm x 0.5cm with no appreciable depth.  It is noted that this area may evolve into a full thickness injury Wound GUY:QIHK purple/maroon discoloration of tissue, no wound at this tie Drainage (amount, consistency, odor) None Periwound:intact Dressing procedure/placement/frequency: Silicone foam dressing is in place, patient is being turned and repositioned per protocol and is on a mattress replacement since he is in the ICU. Upon transfer to a floor, recommend this support surface be continued. Significant comorbid conditions exist.   [facility] nursing team will follow, see every 7-10 days, and will remain available to this patient, the nursing and medical teams. Please re-consult if needed in between visits. Thanks, Maudie Flakes, MSN, RN, [place], Arther [facility]  Pager# [phone]

## 2019-04-21 NOTE — Progress Notes (Signed)
Omar Wagner   HEMATOLOGY/ONCOLOGY INPATIENT PROGRESS NOTE  Date of Service: 04/21/2019  Inpatient Attending: .Collene Gobble, MD   SUBJECTIVE  Patient seen in the medical ICU for oncology follow-up.  He notes no nausea or vomiting.  No prohibitive toxicities from cycle 1 day 1 of carboplatin etoposide.  Receiving premedications for cycle 1 day 2 of etoposide. Notes his breathing is about the same.  Continues to be on nasal cannula oxygen.  CBC stable.  Electrolytes much improved.  Notes improved appetite and p.o. intake.  No other acute new symptoms. Answered his questions in detail and reiterated plan of treatment.  Patient reports he is keen to get out of the ICU as he stabilizes and is looking forward to going home when he feels better.    OBJECTIVE:  No acute distress  PHYSICAL EXAMINATION: . Vitals:   04/21/19 0801 04/21/19 0952 04/21/19 1000 04/21/19 1139  BP:   (!) 159/72   Pulse:   93   Resp:   12   Temp:  98.2 F (36.8 C)    TempSrc:  Oral    SpO2: 92%  94% 95%  Weight:      Height:       Filed Weights   04/19/19 0450 04/20/19 0412 04/21/19 0401  Weight: 201 lb 8 oz (91.4 kg) 201 lb 8 oz (91.4 kg) 202 lb 2.6 oz (91.7 kg)   .Body mass index is 26.67 kg/m.  GENERAL:alert, in no acute distress and comfortable mildly increased work of breathing SKIN: No acute rashes EYES: conjunctiva are pink and non-injected, sclera clear OROPHARYNX: Mucous membranes moist no thrush NECK: supple, no JVD, thyroid normal size, non-tender, without nodularity LYMPH:  no palpable lymphadenopathy in the cervical, axillary or inguinal LUNGS: Decreased air entry on left side in all lung fields  HEART: Mild tachycardia  ABDOMEN: abdomen soft, non-tender PSYCH: alert & oriented x 3 with fluent speech NEURO: no focal motor/sensory deficits  MEDICAL HISTORY:   Past Medical History:  Diagnosis Date   Basal cell carcinoma    Brain metastasis (HCC)    Chronic venous insufficiency     COPD (chronic obstructive pulmonary disease) (HCC)    Depression    History of heroin use    Lung mass    Lymphedema    Paroxysmal SVT (supraventricular tachycardia) ([place])     SURGICAL HISTORY: Past Surgical History:  Procedure Laterality Date   HERNIA REPAIR      SOCIAL HISTORY: Social History   Socioeconomic History   Marital status: Widowed    Spouse name: Not on file   Number of children: Not on file   Years of education: Not on file   Highest education level: Not on file  Occupational History   Not on file  Social Needs   Financial resource strain: Not on file   Food insecurity    Worry: Not on file    Inability: Not on file   Transportation needs    Medical: Not on file    Non-medical: Not on file  Tobacco Use   Smoking status: Current Some Day Smoker    Packs/day: 1.00    Years: 30.00    Pack years: 30.00    Types: Cigarettes   Smokeless tobacco: Never Used  Substance and Sexual Activity   Alcohol use: Yes    Comment: social   Drug use: Yes    Types: Heroin, IV    Comment: heroin  last year   Sexual activity: Never  Comment: opiate abuse  Lifestyle   Physical activity    Days per week: Not on file    Minutes per session: Not on file   Stress: Not on file  Relationships   Social connections    Talks on phone: Not on file    Gets together: Not on file    Attends religious service: Not on file    Active member of club or organization: Not on file    Attends meetings of clubs or organizations: Not on file    Relationship status: Not on file   Intimate partner violence    Fear of current or ex partner: Not on file    Emotionally abused: Not on file    Physically abused: Not on file    Forced sexual activity: Not on file  Other Topics Concern   Not on file  Social History Narrative   Not on file    FAMILY HISTORY: Family History  Problem Relation Age of Onset   Cancer Other    Breast cancer Mother    Colon  cancer Father    Breast cancer Sister    Liver cancer Brother     ALLERGIES:  has No Known Allergies.  MEDICATIONS:  Scheduled Meds:  chlorhexidine gluconate (MEDLINE KIT)  15 mL Mouth Rinse BID   Chlorhexidine Gluconate Cloth  6 each Topical Daily   enoxaparin (LOVENOX) injection  40 mg Subcutaneous Q24H   etoposide  100 mg/m2 (Treatment Plan Recorded) Intravenous Once   [START ON 04/22/2019] etoposide  100 mg/m2 (Treatment Plan Recorded) Intravenous Once   insulin aspart  0-9 Units Subcutaneous Q4H   ipratropium  0.5 mg Nebulization QID   levalbuterol  0.63 mg Nebulization QID   mouth rinse  15 mL Mouth Rinse BID   montelukast  10 mg Oral QHS   pantoprazole  40 mg Oral Daily   Resource ThickenUp Clear   Oral TID WC & HS   sodium chloride flush  10-40 mL Intracatheter Q12H   Continuous Infusions:  amiodarone 30 mg/hr (04/21/19 0400)   amiodarone     dexamethasone (DECADRON) IVPB ([facility])     [START ON 04/22/2019] dexamethasone (DECADRON) IVPB ([facility])     PRN Meds:.acetaminophen **OR** acetaminophen, levalbuterol, [START ON 04/23/2019] ondansetron (ZOFRAN) IV, [START ON 04/23/2019] ondansetron, prochlorperazine, prochlorperazine, sodium chloride flush  REVIEW OF SYSTEMS:    10 Point review of Systems was done is negative except as noted above.   LABORATORY DATA:  I have reviewed the data as listed  . CBC Latest Ref Rng & Units 04/21/2019 04/19/2019 04/16/2019  WBC 4.0 - 10.5 K/uL 9.0 9.7 11.2(H)  Hemoglobin 13.0 - 17.0 g/dL 13.3 13.5 15.2  Hematocrit 39.0 - 52.0 % 41.9 42.5 46.6  Platelets 150 - 400 K/uL 144(L) 106(L) 100(L)    . CMP Latest Ref Rng & Units 04/21/2019 04/20/2019 04/20/2019  Glucose 70 - 99 mg/dL 180(H) 162(H) 122(H)  BUN 8 - 23 mg/dL '17 17 12  '$ Creatinine 0.61 - 1.24 mg/dL 0.74 0.78 0.48(L)  Sodium 135 - 145 mmol/L 144 143 144  Potassium 3.5 - 5.1 mmol/L 4.1 3.8 2.6(LL)  Chloride 98 - 111 mmol/L 112(H) 114(H) 121(H)  CO2 22 - 32 mmol/L 23  22 18(L)  Calcium 8.9 - 10.3 mg/dL 7.0(L) 6.9(L) 4.9(LL)  Total Protein 6.5 - 8.1 g/dL - 5.4(L) -  Total Bilirubin 0.3 - 1.2 mg/dL - 0.5 -  Alkaline Phos 38 - 126 U/L - 47 -  AST 15 - 41 U/L -  30 -  ALT 0 - 44 U/L - 13 -   Component     Latest Ref Rng & Units 04/21/2019  Phosphorus     2.5 - 4.6 mg/dL 3.3  Lactic Acid, Venous     0.5 - 1.9 mmol/L 1.5    RADIOGRAPHIC STUDIES: I have personally reviewed the radiological images as listed and agreed with the findings in the report. Dg Abd 1 View  Result Date: 04/15/2019 CLINICAL DATA:  Status post OG tube placement. EXAM: ABDOMEN - 1 VIEW COMPARISON:  None. FINDINGS: OG tube is in place with the tip in the distal stomach. IMPRESSION: As above. Electronically Signed   By: Inge Rise M.D.   On: 04/15/2019 15:51   Ct Chest W Contrast  Result Date: 04/10/2019 CLINICAL DATA:  Shortness of breath and chest pain. History of basal cell carcinoma. EXAM: CT CHEST WITH CONTRAST TECHNIQUE: Multidetector CT imaging of the chest was performed during intravenous contrast administration. CONTRAST:  42m OMNIPAQUE IOHEXOL 300 MG/ML  SOLN COMPARISON:  01/08/2019.  PET-CT dated 03/01/2019 FINDINGS: Cardiovascular: The heart size is stable from prior study. The main pulmonary artery is dilated measuring approximately 3.6 cm in diameter. There is no aortic aneurysm. No thoracic aortic dissection. Mediastinum/Nodes: --there are new pathologically enlarged mediastinal lymph nodes measuring up to approximately 2.3 cm in the short axis. There are pathologically enlarged left hilar lymph nodes. --No axillary lymphadenopathy. --No supraclavicular lymphadenopathy. --Normal thyroid gland. --The esophagus is unremarkable Lungs/Pleura: There is new near complete collapse of the left upper lobe. There is consolidation at the left lung apex which may represent postobstructive pneumonia. The left upper lobe bronchus appears to be occluded. There is significant narrowing of  the left lower lobe bronchus. There is some consolidation involving the left lower lobe with a small left-sided pleural effusion. The previously demonstrated left upper lobe lung mass appears to have substantially increased in size but is difficult to distinguish from the collapsed lung. An estimate is that the mass mass measures at least 9.2 x 7.6 cm. There are mild emphysematous changes in the right lung field. There is a growing spiculated 1 cm pulmonary nodule in the right upper lobe (axial series 5, image 54). There is a significant amount of debris within the trachea and right mainstem bronchus. There is a new pulmonary opacity in the anterior right upper lobe measuring approximately 1.7 cm (axial series 5, image 62). There is some atelectasis versus consolidation involving the right lower lobe. Upper Abdomen: There are new hypoattenuating masses in the liver measuring 1.9 cm in hepatic segment 4A and 1.7 cm in hepatic segment 4 B. Musculoskeletal: There is new destruction of the posterior fifth and sixth ribs on the left consistent with osseous involvement of the known lung mass. IMPRESSION: 1. New near complete collapse of the left upper lobe felt to be secondary to progression of the patient's known left upper lobe lung mass, compressing the left upper lobe bronchus. 2. Findings consistent with significant interval progression of disease as evidenced by new pathologically enlarged mediastinal lymph nodes as well as multiple new low-attenuation masses in the liver concerning for hepatic metastatic disease. In addition, there are new pulmonary opacities in the right upper and right lower lobes raising concern for additional foci of metastatic disease. 3. Consolidation involving the left lung apex concerning for postobstructive pneumonia. 4. Moderate amount of debris within the trachea and bronchi bilaterally. 5. Significant narrowing of the left lower lobe bronchus. There is consolidation involving the left  lower lobe with a small left-sided pleural effusion. 6. New osseous involvement involving the posterior fifth and sixth ribs on the left. Electronically Signed   By: Constance Holster M.D.   On: 03/21/2019 17:44   Mr Jeri Cos DZ Contrast  Result Date: 04/17/2019 CLINICAL DATA:  Lung cancer, staging EXAM: MRI HEAD WITHOUT AND WITH CONTRAST TECHNIQUE: Multiplanar, multiecho pulse sequences of the brain and surrounding structures were obtained without and with intravenous contrast. CONTRAST:  83m GADAVIST GADOBUTROL 1 MMOL/ML IV SOLN COMPARISON:  None. FINDINGS: Brain: There is a 1 cm peripherally enhancing lesion in the inferior left cerebellum (series 11, image 13). There is a 3 mm left cerebellar lesion on image 15. Questionable additional ill-defined enhancing foci in the left cerebellum, which may be artifactual. Suspected 3 mm enhancing lesion adjacent to the right frontal horn (image 32). No significant associated edema. There is no acute infarction or intracranial hemorrhage. There is no hydrocephalus or extra-axial fluid collection. Prominence of the ventricles and sulci reflects mild generalized parenchymal volume loss. Patchy and confluent T2 hyperintensity in the supratentorial and pontine white matter is nonspecific but probably reflects moderate chronic microvascular ischemic changes. Vascular: Major vessel flow voids at the skull base are preserved. Skull and upper cervical spine: Normal marrow signal is preserved. Sinuses/Orbits: Minor mucosal thickening. Bilateral lens replacements. Other: Sella is unremarkable.  Right mastoid effusion. 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 Suspected small lesion adjacent to the right frontal horn. Additional chronic/nonemergent findings detailed above. Electronically Signed   By: PMacy MisM.D.   On: 04/17/2019 12:27   Ct Biopsy  Result Date: 04/13/2019 INDICATION: Concern for metastatic lung cancer. Please perform CT-guided  biopsy of enlarging hypermetabolic soft tissue between the posterior aspect of left sixth and seventh ribs for tissue diagnostic purposes. EXAM: CT-GUIDED BIOPSY OF HYPERMETABOLIC SOFT TISSUE BETWEEN THE POSTERIOR ASPECTS OF THE LEFT SIXTH AND SEVENTH RIBS. COMPARISON:  Chest CT-03/27/2019; 01/08/2019; PET-CT-03/01/2019 MEDICATIONS: None. ANESTHESIA/SEDATION: Fentanyl 100 mcg IV; Versed 2 mg IV Sedation time: 14 minutes; The patient was continuously monitored during the procedure by the interventional radiology nurse under my direct supervision. CONTRAST:  None. COMPLICATIONS: None immediate. PROCEDURE: Informed consent was obtained from the patient following an explanation of the procedure, risks, benefits and alternatives. A time out was performed prior to the initiation of the procedure. The patient was positioned prone on the CT table and a limited CT was performed for procedural planning demonstrating unchanged size and appearance of the ill-defined infiltrative soft tissue involving the left posterior chest wall between the left sixth and seventh ribs with dominant soft tissue nodule measuring approximately 2.3 x 1.6 cm (image 17, series 2), previously found to be hypermetabolic on preceding PET-CT. The procedure was planned. The operative site was prepped and draped in the usual sterile fashion. Appropriate trajectory was confirmed with a 22 gauge spinal needle after the adjacent tissues were anesthetized with 1% Lidocaine with epinephrine. Under intermittent CT guidance, a 17 gauge coaxial needle was advanced into the peripheral aspect of the mass. Appropriate positioning was confirmed and 5 core needle biopsy samples were obtained with an 18 gauge core needle biopsy device. The co-axial needle was removed and hemostasis was achieved with manual compression. A limited postprocedural CT was negative for pneumothorax, hemorrhage or additional complication. A dressing was placed. The patient tolerated the  procedure well without immediate postprocedural complication. IMPRESSION: Technically successful CT guided core needle biopsy of infiltrative hypermetabolic soft tissue nodule between the posterior aspects  of the left sixth and seventh ribs. Electronically Signed   By: Sandi Mariscal M.D.   On: 04/13/2019 13:53   Dg Chest Port 1 View  Result Date: 04/20/2019 CLINICAL DATA:  Respiratory failure.  Lung cancer. EXAM: PORTABLE CHEST 1 VIEW COMPARISON:  Chest x-ray 04/19/2019 FINDINGS: The endotracheal tube and NG tubes have been removed. The right PICC line is stable. Stable complete opacification of the left hemithorax. The right lung is clear. IMPRESSION: 1. Removal of ET and NG tubes.  The right PICC line is stable. 2. Persistent complete opacification of the left hemithorax. Electronically Signed   By: Marijo Sanes M.D.   On: 04/20/2019 07:34   Dg Chest Port 1 View  Result Date: 04/19/2019 CLINICAL DATA:  11/29: Episodes of hypertension As well as bradycardia. Alternating with paroxysmal atrial fibrillation with rapid ventricular response/SVT. Increasing oxygen requirements noted cardiology consulted. Started amiodarone, felt not a candidate for pacing. Developed worsening respiratory failure requiring intubation. Underwent bronchoscopy at bedside showing extensive endobronchial tumor burden in the left upper lobe with extrinsic compression on the left lower lobe.-Having recurrent bradycardia so amiodarone held later in the evening, 11/30: Went back into SVT early a.m. hours. Heart rate 198 205. Amiodarone drip resumed. Also receiving as needed Lopressor. Marked for first XRT12/1 still vent dependent. Having freq atrial arrhythmias but more stable EXAM: PORTABLE CHEST 1 VIEW COMPARISON:  04/18/2019 and earlier exams. FINDINGS: Left hemithorax remains near completely opacified. Mild opacity at the medial right lung base consistent with atelectasis. Right lung otherwise clear. Endotracheal tube, nasal/orogastric  tube and right-sided PICC are stable. No pneumothorax. IMPRESSION: 1. No change from previous day's study. 2. Stable support apparatus. Electronically Signed   By: Lajean Manes M.D.   On: 04/19/2019 09:18   Dg Chest Port 1 View  Result Date: 04/18/2019 CLINICAL DATA:  Follow-up exam.  Atelectasis.  Intubated patient. EXAM: PORTABLE CHEST 1 VIEW COMPARISON:  04/17/2019 and earlier exams. FINDINGS: Near complete opacification of the left hemithorax is unchanged from the previous day's exam. Mild opacity at the medial right lung base is noted consistent with atelectasis. Right lung otherwise clear. Endotracheal tube, right PICC and nasal/orogastric tube are stable and well positioned. No pneumothorax. IMPRESSION: 1. No change from the previous day's exam. 2. Persistent near complete opacification of the left hemithorax due to the patient's known mass and postobstructive changes. 3. Stable well-positioned support apparatus. Electronically Signed   By: Lajean Manes M.D.   On: 04/18/2019 09:09   Dg Chest Port 1 View  Result Date: 04/17/2019 CLINICAL DATA:  Respiratory failure. EXAM: PORTABLE CHEST 1 VIEW COMPARISON:  One-view chest x-ray 04/15/2019. CT chest 03/28/2019 FINDINGS: The heart is obscured. Endotracheal tube is stable. Right-sided PICC line is stable. NG tube courses off the inferior border of the film. Near complete opacification of left hemithorax is stable. The right lung is clear. IMPRESSION: 1. Stable appearance of the chest with near complete opacification of the left hemithorax reflecting known left lung mass and post obstructive disease. 2. The support apparatus is stable. Electronically Signed   By: San Morelle M.D.   On: 04/17/2019 07:09   Dg Chest Port 1 View  Result Date: 04/15/2019 CLINICAL DATA:  Status post ET tube placement. EXAM: PORTABLE CHEST 1 VIEW COMPARISON:  Chest radiograph 04/15/2019 FINDINGS: Monitoring leads overlie the patient. ET tube mid trachea. Enteric  tube courses inferior to the diaphragm. Persistent opacification of the left hemithorax. Minimal right basilar atelectasis. Thoracic spine degenerative changes. IMPRESSION: ET  tube mid trachea. Persistent opacification of the left hemithorax. Electronically Signed   By: Lovey Newcomer M.D.   On: 04/15/2019 15:30   Dg Chest Port 1 View  Result Date: 04/15/2019 CLINICAL DATA:  Dyspnea EXAM: PORTABLE CHEST 1 VIEW COMPARISON:  Chest x-rays dated 04/14/2019 04/11/2019. FINDINGS: Continued complete opacification of the LEFT hemithorax. RIGHT lung remains clear. IMPRESSION: 1. Continued complete opacification of the LEFT hemithorax, compatible with previous description of a known malignancy and associated postobstructive atelectasis. 2. RIGHT lung remains clear. Electronically Signed   By: Franki Cabot M.D.   On: 04/15/2019 08:52   Dg Chest Port 1 View  Result Date: 04/14/2019 CLINICAL DATA:  Shortness of breath EXAM: PORTABLE CHEST 1 VIEW COMPARISON:  Chest radiograph 03/23/2019 FINDINGS: Monitoring leads overlie the patient. Interval worsening complete opacification left hemithorax. Minimal right basilar atelectasis. Thoracic spine degenerative changes. IMPRESSION: Interval worsening of opacification of the left hemithorax most compatible with known malignancy and associated postobstructive atelectasis. Electronically Signed   By: Lovey Newcomer M.D.   On: 04/14/2019 17:39   Dg Chest Port 1 View  Result Date: 04/08/2019 CLINICAL DATA:  Patient reports to the ED with complaint of SOB and chest pain. Patient has a hx of lymphoma and reports he has been receiving care. [name] legs have an odor and his skin is dry and flaky EXAM: PORTABLE CHEST 1 VIEW COMPARISON:  10/27/2018.  PET-CT, 03/01/2019. FINDINGS: Near complete opacification of the left hemithorax. This has developed since prior chest radiograph, and since the more recent prior PET-CT. The opacification is consistent combination of the posterior left  upper lobe hypermetabolic mass noted on the prior PET-CT in combination atelectasis or postobstructive consolidation. Pleural fluid is also suspected. Right lung is hyperexpanded, but otherwise clear. No right pleural effusion. No pneumothorax. IMPRESSION: 1. Significant interval worsening left lung aeration. Left hemithorax is now mostly opacified consistent with a combination of the presumed left upper lobe malignancy with postobstructive change/atelectasis and probable pleural fluid. Electronically Signed   By: Lajean Manes M.D.   On: 03/21/2019 14:25   [place] Ekg Site Rite  Result Date: 04/16/2019 If Site Rite image not attached, placement could not be confirmed due to current cardiac rhythm.   ASSESSMENT & PLAN:   This is a 63 year old male with:  1. Extensive stage small cell lung cancer -The patient was noted to have a possible mass on chest x-ray from 10/27/2018. -CT of the chest performed on 8/24/2020demonstrated a 5.9 cm mass in the posterior left upper lobe possible lymphangitic spread and small mediastinal lymph nodes -PET scan performed on 03/01/2019 demonstrated a hypermetabolic 8.5 x 4.8 cm in the posterior left upper lobe of the lung with direct peripheral left stool invasion between the lateral left fifth and sixth ribs and direct left paraspinal invasion at the T4 level. Additionally there were also hypermetabolic ipsilateral mediastinal lymph nodes, hypermetabolic distant nodal metastasis in the left external iliac chain, hypermetabolic posterior left chest wall metastasis between the left sixth and seventh ribs, and hypermetabolic osseous metastases to the left sixth rib. -The patient was admitted to the hospital on 03/20/2019 and a CT of the chest showed new near complete collapse of the left upper lobe secondary to progression of the patient's known left upper lobe lung mass, compressing the left upper lobe bronchus. There also multiple new low-attenuation masses in the liver  concerning for hepatic metastatic disease, progression in the mediastinal lymph nodes, and new pulmonary opacities in the right upper and right lower  lobes. -CT-guided biopsy of the hypermetabolic left posterior chest wall nodule performed on 04/13/2019 demonstrated small cell carcinoma. -The patient initiated radiation to the left lung mass on 04/16/2019. -MRI of the brain on 04/17/2019 demonstrated at least 2 small left cerebellar metastatic lesions and suspected small lesion adjacent to the right frontal horn. -The patient will continue radiation to the left lung mass under the care of Dr. Lisbeth Renshaw. Plan -Patient appears to have tolerated cycle 1 day 1 of carboplatin etoposide without any immediate prohibitive toxicities -Labs are stable today to proceed with cycle 1 day 2 etoposide. -Cycle 1 day 3 etoposide will be on Monday 12/7 given nursing staffing availability. -We shall start Granix daily for 5 days from 04/24/2019 since Neulasta cannot be given as inpatient. -All of patient's questions and concerns addressed in details. -He should continue and complete his planned course of lung RT as per Dr. Ida Rogue plan. -Given his brain mets are small and asymptomatic we have prioritized systemic chemotherapy which might have about a 60% response rate for his brain mets as well.  He will likely eventually need whole brain radiation once systemic disease control is achieved or if brain mets become symptomatic. -Would be inclined to repeat MRI of the brain after 2 cycles of systemic chemotherapy for interval reevaluation.  2.Acute hypoxic respiratory failure due to also lung cancer with associated postobstructive atelectasis/pneumonia and COPD exacerbation -Respiratory status slowly improving -Ongoing management per PCCM-much appreciate their excellent care  3. Mild thrombocytopenia-platelets have improved from 106k to 144k  4. Hypercalcemia of malignancy -Calcium was 14.1 on admission and now  corrected -Status post zoledronic acid on 03/20/2019 and he also received calcitonin and IV hydration -Calcium level is now normal -Would check 25-hydroxy vitamin D levels tomorrow a.m. and replace to maintain levels close to 60 to avoid rebound hypocalcemia from his bisphosphonates. -Will need every 4 weekly Zometa to address bone metastases as outpatient.  5. Sinus tachycardia/SVT -On amiodarone  6. Chronic lymphedema -Followed by wound clinic as an outpatient  7.  DVT prophylaxis -on Lovenox as per PCCM team    I spent 25 minutes counseling the patient face to face. The total time spent in the appointment was 35 minutes and more than 50% was on counseling and direct patient cares.    Sullivan Lone MD [place] AAHIVMS [facility] Hematology/Oncology Physician [facility]  (Office):       [phone] (Work cell):  [phone] (Fax):           [phone]  04/21/2019 11:58 AM

## 2019-04-21 NOTE — Progress Notes (Signed)
eLink Physician-Brief Progress Note Patient Name: Omar Wagner DOB: Sep 16, 1955 MRN: [id number]   Date of Service  04/21/2019  HPI/Events of Note  K+ = 3.9, PO4--- = 2.4, Ca++ = 7.0 which corrects to 8.28 (low) given albumin = 2.4 and Creatinine 0.72  eICU Interventions  Will replace K+, PO4--- and Ca++.     Intervention Category Major Interventions: Electrolyte abnormality - evaluation and management  Shawnell Dykes [place] 04/21/2019, 10:25 PM

## 2019-04-21 NOTE — Progress Notes (Signed)
Verified chemotherapy dosage and calculations with Everitt Amber, RN.

## 2019-04-22 ENCOUNTER — Inpatient Hospital Stay (HOSPITAL_COMMUNITY): Payer: Medicare Other

## 2019-04-22 DIAGNOSIS — C7931 Secondary malignant neoplasm of brain: Secondary | ICD-10-CM | POA: Diagnosis not present

## 2019-04-22 DIAGNOSIS — C3490 Malignant neoplasm of unspecified part of unspecified bronchus or lung: Secondary | ICD-10-CM | POA: Diagnosis not present

## 2019-04-22 DIAGNOSIS — Z5111 Encounter for antineoplastic chemotherapy: Secondary | ICD-10-CM | POA: Diagnosis not present

## 2019-04-22 LAB — GLUCOSE, CAPILLARY
Glucose-Capillary: 135 mg/dL — ABNORMAL HIGH (ref 70–99)
Glucose-Capillary: 106 mg/dL — ABNORMAL HIGH (ref 70–99)
Glucose-Capillary: 91 mg/dL (ref 70–99)
Glucose-Capillary: 130 mg/dL — ABNORMAL HIGH (ref 70–99)
Glucose-Capillary: 168 mg/dL — ABNORMAL HIGH (ref 70–99)
Glucose-Capillary: 175 mg/dL — ABNORMAL HIGH (ref 70–99)

## 2019-04-22 LAB — BASIC METABOLIC PANEL
Anion gap: 8 (ref 5–15)
Anion gap: 8 (ref 5–15)
BUN: 16 mg/dL (ref 8–23)
BUN: 19 mg/dL (ref 8–23)
CO2: 23 mmol/L (ref 22–32)
CO2: 22 mmol/L (ref 22–32)
Calcium: 7 mg/dL — ABNORMAL LOW (ref 8.9–10.3)
Calcium: 7 mg/dL — ABNORMAL LOW (ref 8.9–10.3)
Chloride: 107 mmol/L (ref 98–111)
Chloride: 112 mmol/L — ABNORMAL HIGH (ref 98–111)
Creatinine, Ser: 0.8 mg/dL (ref 0.61–1.24)
Creatinine, Ser: 0.79 mg/dL (ref 0.61–1.24)
GFR calc Af Amer: >60 mL/min (ref >60)
GFR calc Af Amer: >60 mL/min (ref >60)
GFR calc non Af Amer: >60 mL/min (ref >60)
GFR calc non Af Amer: >60 mL/min (ref >60)
Glucose, Bld: 133 mg/dL — ABNORMAL HIGH (ref 70–99)
Glucose, Bld: 180 mg/dL — ABNORMAL HIGH (ref 70–99)
Potassium: 3.9 mmol/L (ref 3.5–5.1)
Potassium: 3.7 mmol/L (ref 3.5–5.1)
Sodium: 138 mmol/L (ref 135–145)
Sodium: 142 mmol/L (ref 135–145)

## 2019-04-22 LAB — CBC
HCT: 40.9 % (ref 39.0–52.0)
Hemoglobin: 12.9 g/dL — ABNORMAL LOW (ref 13.0–17.0)
MCH: 36.4 pg — ABNORMAL HIGH (ref 26.0–34.0)
MCHC: 31.5 g/dL (ref 30.0–36.0)
MCV: 115.5 fL — ABNORMAL HIGH (ref 80.0–100.0)
Platelets: 155 10*3/uL (ref 150–400)
RBC: 3.54 MIL/uL — ABNORMAL LOW (ref 4.22–5.81)
RDW: 12.1 % (ref 11.5–15.5)
WBC: 11.7 10*3/uL — ABNORMAL HIGH (ref 4.0–10.5)
nRBC: 0 % (ref 0.0–0.2)

## 2019-04-22 LAB — PHOSPHORUS
Phosphorus: 2.9 mg/dL (ref 2.5–4.6)
Phosphorus: 2.5 mg/dL (ref 2.5–4.6)

## 2019-04-22 MED ORDER — METOPROLOL TARTRATE 12.5 MG HALF TABLET
12.5000 mg | ORAL_TABLET | Freq: Two times a day (BID) | ORAL | Status: DC
  Administered 2019-04-22 – 2019-04-28 (×14): 12.5 mg via ORAL
  Filled 2019-04-22 (×14): qty 1

## 2019-04-22 MED ORDER — IPRATROPIUM BROMIDE 0.02 % IN SOLN
0.5000 mg | RESPIRATORY_TRACT | Status: DC
  Administered 2019-04-22 – 2019-04-23 (×6): 0.5 mg via RESPIRATORY_TRACT
  Filled 2019-04-22 (×5): qty 2.5

## 2019-04-22 MED ORDER — LEVALBUTEROL HCL 0.63 MG/3ML IN NEBU
0.6300 mg | INHALATION_SOLUTION | RESPIRATORY_TRACT | Status: DC
  Administered 2019-04-22 – 2019-04-23 (×6): 0.63 mg via RESPIRATORY_TRACT
  Filled 2019-04-22 (×5): qty 3

## 2019-04-22 NOTE — Progress Notes (Signed)
Marland Kitchen   HEMATOLOGY/ONCOLOGY INPATIENT PROGRESS NOTE  Date of Service: 04/22/2019  Inpatient Attending: .Collene Gobble, MD   SUBJECTIVE  Patient seen in the medical ICU for oncology follow-up.  Denies any acute prohibitive toxicities.  Notes some left-sided chest discomfort without acute changes.  Oxygenation about the same.  Severe COPD at baseline. Notes improving p.o. intake.  Some decrease in phlegm production. No fevers no chills.  OBJECTIVE:  No acute distress  PHYSICAL EXAMINATION: . Vitals:   04/22/19 0750 04/22/19 0800 04/22/19 1119 04/22/19 1200  BP:    138/85  Pulse:  (!) 106  86  Resp:    11  Temp:  98 F (36.7 C)  97.9 F (36.6 C)  TempSrc:  Oral  Oral  SpO2: 94% 98% 96% 95%  Weight:      Height:       Filed Weights   04/19/19 0450 04/20/19 0412 04/21/19 0401  Weight: 201 lb 8 oz (91.4 kg) 201 lb 8 oz (91.4 kg) 202 lb 2.6 oz (91.7 kg)   .Body mass index is 26.67 kg/m.  Marland Kitchen GENERAL:alert, in no acute distress, on nasal cannula oxygen SKIN: no acute rashes EYES: conjunctiva are pink and non-injected, sclera anicteric OROPHARYNX: MMM NECK: supple, no JVD LYMPH:  no palpable lymphadenopathy in the cervical, axillary or inguinal regions LUNGS: Decreased air entry bilaterally much more on the left side HEART: regular rate & rhythm ABDOMEN:  normoactive bowel sounds , non tender, not distended. Extremity: 2+ pitting pedal edema with changes of chronic venous stasis/lymphedema PSYCH: alert & oriented x 3 with fluent speech NEURO: no focal motor/sensory deficits    MEDICAL HISTORY:   Past Medical History:  Diagnosis Date   Basal cell carcinoma    Brain metastasis (HCC)    Chronic venous insufficiency    COPD (chronic obstructive pulmonary disease) (HCC)    Depression    History of heroin use    Lung mass    Lymphedema    Paroxysmal SVT (supraventricular tachycardia) ([place])     SURGICAL HISTORY: Past Surgical History:  Procedure  Laterality Date   HERNIA REPAIR      SOCIAL HISTORY: Social History   Socioeconomic History   Marital status: Widowed    Spouse name: Not on file   Number of children: Not on file   Years of education: Not on file   Highest education level: Not on file  Occupational History   Not on file  Social Needs   Financial resource strain: Not on file   Food insecurity    Worry: Not on file    Inability: Not on file   Transportation needs    Medical: Not on file    Non-medical: Not on file  Tobacco Use   Smoking status: Current Some Day Smoker    Packs/day: 1.00    Years: 30.00    Pack years: 30.00    Types: Cigarettes   Smokeless tobacco: Never Used  Substance and Sexual Activity   Alcohol use: Yes    Comment: social   Drug use: Yes    Types: Heroin, IV    Comment: heroin  last year   Sexual activity: Never    Comment: opiate abuse  Lifestyle   Physical activity    Days per week: Not on file    Minutes per session: Not on file   Stress: Not on file  Relationships   Social connections    Talks on phone: Not on file  Gets together: Not on file  °  Attends religious service: Not on file  °  Active member of club or organization: Not on file  °  Attends meetings of clubs or organizations: Not on file  °  Relationship status: Not on file  °• Intimate partner violence  °  Fear of current or ex partner: Not on file  °  Emotionally abused: Not on file  °  Physically abused: Not on file  °  Forced sexual activity: Not on file  °Other Topics Concern  °• Not on file  °Social History Narrative  °• Not on file  ° ° °FAMILY HISTORY: °Family History  °Problem Relation Age of Onset  °• Cancer Other   °• Breast cancer Mother   °• Colon cancer Father   °• Breast cancer Sister   °• Liver cancer Brother   ° ° °ALLERGIES:  has No Known Allergies. ° °MEDICATIONS:  °Scheduled Meds: °• chlorhexidine gluconate (MEDLINE KIT)  15 mL Mouth Rinse BID  °• Chlorhexidine Gluconate Cloth  6  each Topical Daily  °• enoxaparin (LOVENOX) injection  40 mg Subcutaneous Q24H  °• etoposide  100 mg/m2 (Treatment Plan Recorded) Intravenous Once  °• insulin aspart  0-9 Units Subcutaneous Q4H  °• ipratropium  0.5 mg Nebulization Q4H  °• levalbuterol  0.63 mg Nebulization Q4H  °• mouth rinse  15 mL Mouth Rinse BID  °• metoprolol tartrate  12.5 mg Oral BID  °• montelukast  10 mg Oral QHS  °• pantoprazole  40 mg Oral Daily  °• Resource ThickenUp Clear   Oral TID WC & HS  °• sodium chloride flush  10-40 mL Intracatheter Q12H  ° °Continuous Infusions: ° °PRN Meds:.acetaminophen **OR** acetaminophen, fentaNYL (SUBLIMAZE) injection, levalbuterol, [START ON 04/23/2019] ondansetron (ZOFRAN) IV, [START ON 04/23/2019] ondansetron, prochlorperazine, prochlorperazine, sodium chloride flush ° °REVIEW OF SYSTEMS:   ° °10 Point review of Systems was done is negative except as noted above. ° ° °LABORATORY DATA:  °I have reviewed the data as listed ° °. °CBC Latest Ref Rng & Units 04/22/2019 04/21/2019 04/19/2019  °WBC 4.0 - 10.5 K/uL 11.7(H) 9.0 9.7  °Hemoglobin 13.0 - 17.0 g/dL 12.9(L) 13.3 13.5  °Hematocrit 39.0 - 52.0 % 40.9 41.9 42.5  °Platelets 150 - 400 K/uL 155 144(L) 106(L)  ° ° °. °CMP Latest Ref Rng & Units 04/22/2019 04/21/2019 04/21/2019  °Glucose 70 - 99 mg/dL 133(H) 178(H) 180(H)  °BUN 8 - 23 mg/dL 16 17 17  °Creatinine 0.61 - 1.24 mg/dL 0.80 0.72 0.74  °Sodium 135 - 145 mmol/L 138 143 144  °Potassium 3.5 - 5.1 mmol/L 3.9 3.9 4.1  °Chloride 98 - 111 mmol/L 107 113(H) 112(H)  °CO2 22 - 32 mmol/L 23 22 23  °Calcium 8.9 - 10.3 mg/dL 7.0(L) 7.0(L) 7.0(L)  °Total Protein 6.5 - 8.1 g/dL - - -  °Total Bilirubin 0.3 - 1.2 mg/dL - - -  °Alkaline Phos 38 - 126 U/L - - -  °AST 15 - 41 U/L - - -  °ALT 0 - 44 U/L - - -  ° °Component °    Latest Ref Rng & Units 04/21/2019  °Phosphorus °    2.5 - 4.6 mg/dL 3.3  °Lactic Acid, Venous °    0.5 - 1.9 mmol/L 1.5  ° ° °RADIOGRAPHIC STUDIES: °I have personally reviewed the radiological images as  listed and agreed with the findings in the report. °Dg Abd 1 View ° °Result Date: 04/15/2019 °CLINICAL DATA:  Status post   OG tube placement. EXAM: ABDOMEN - 1 VIEW COMPARISON:  None. FINDINGS: OG tube is in place with the tip in the distal stomach. IMPRESSION: As above. Electronically Signed   By: Thomas  Dalessio M.D.   On: 04/15/2019 15:51  ° °Ct Chest W Contrast ° °Result Date: 03/24/2019 °CLINICAL DATA:  Shortness of breath and chest pain. History of basal cell carcinoma. EXAM: CT CHEST WITH CONTRAST TECHNIQUE: Multidetector CT imaging of the chest was performed during intravenous contrast administration. CONTRAST:  75mL OMNIPAQUE IOHEXOL 300 MG/ML  SOLN COMPARISON:  01/08/2019.  PET-CT dated 03/01/2019 FINDINGS: Cardiovascular: The heart size is stable from prior study. The main pulmonary artery is dilated measuring approximately 3.6 cm in diameter. There is no aortic aneurysm. No thoracic aortic dissection. Mediastinum/Nodes: --there are new pathologically enlarged mediastinal lymph nodes measuring up to approximately 2.3 cm in the short axis. There are pathologically enlarged left hilar lymph nodes. --No axillary lymphadenopathy. --No supraclavicular lymphadenopathy. --Normal thyroid gland. --The esophagus is unremarkable Lungs/Pleura: There is new near complete collapse of the left upper lobe. There is consolidation at the left lung apex which may represent postobstructive pneumonia. The left upper lobe bronchus appears to be occluded. There is significant narrowing of the left lower lobe bronchus. There is some consolidation involving the left lower lobe with a small left-sided pleural effusion. The previously demonstrated left upper lobe lung mass appears to have substantially increased in size but is difficult to distinguish from the collapsed lung. An estimate is that the mass mass measures at least 9.2 x 7.6 cm. There are mild emphysematous changes in the right lung field. There is a growing spiculated  1 cm pulmonary nodule in the right upper lobe (axial series 5, image 54). There is a significant amount of debris within the trachea and right mainstem bronchus. There is a new pulmonary opacity in the anterior right upper lobe measuring approximately 1.7 cm (axial series 5, image 62). There is some atelectasis versus consolidation involving the right lower lobe. Upper Abdomen: There are new hypoattenuating masses in the liver measuring 1.9 cm in hepatic segment 4A and 1.7 cm in hepatic segment 4 B. Musculoskeletal: There is new destruction of the posterior fifth and sixth ribs on the left consistent with osseous involvement of the known lung mass. IMPRESSION: 1. New near complete collapse of the left upper lobe felt to be secondary to progression of the patient's known left upper lobe lung mass, compressing the left upper lobe bronchus. 2. Findings consistent with significant interval progression of disease as evidenced by new pathologically enlarged mediastinal lymph nodes as well as multiple new low-attenuation masses in the liver concerning for hepatic metastatic disease. In addition, there are new pulmonary opacities in the right upper and right lower lobes raising concern for additional foci of metastatic disease. 3. Consolidation involving the left lung apex concerning for postobstructive pneumonia. 4. Moderate amount of debris within the trachea and bronchi bilaterally. 5. Significant narrowing of the left lower lobe bronchus. There is consolidation involving the left lower lobe with a small left-sided pleural effusion. 6. New osseous involvement involving the posterior fifth and sixth ribs on the left. Electronically Signed   By: Christopher  Green M.D.   On: 03/23/2019 17:44  ° °Mr Brain W Wo Contrast ° °Result Date: 04/17/2019 °CLINICAL DATA:  Lung cancer, staging EXAM: MRI HEAD WITHOUT AND WITH CONTRAST TECHNIQUE: Multiplanar, multiecho pulse sequences of the brain and surrounding structures were obtained  without and with intravenous contrast. CONTRAST:  8mL GADAVIST   GADOBUTROL 1 MMOL/ML IV SOLN COMPARISON:  None. FINDINGS: Brain: There is a 1 cm peripherally enhancing lesion in the inferior left cerebellum (series 11, image 13). There is a 3 mm left cerebellar lesion on image 15. Questionable additional ill-defined enhancing foci in the left cerebellum, which may be artifactual. Suspected 3 mm enhancing lesion adjacent to the right frontal horn (image 32). No significant associated edema. There is no acute infarction or intracranial hemorrhage. There is no hydrocephalus or extra-axial fluid collection. Prominence of the ventricles and sulci reflects mild generalized parenchymal volume loss. Patchy and confluent T2 hyperintensity in the supratentorial and pontine white matter is nonspecific but probably reflects moderate chronic microvascular ischemic changes. Vascular: Major vessel flow voids at the skull base are preserved. Skull and upper cervical spine: Normal marrow signal is preserved. Sinuses/Orbits: Minor mucosal thickening. Bilateral lens replacements. Other: Sella is unremarkable.  Right mastoid effusion. 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 Suspected small lesion adjacent to the right frontal horn. Additional chronic/nonemergent findings detailed above. Electronically Signed   By: Praneil  Patel M.D.   On: 04/17/2019 12:27  ° °Ct Biopsy ° °Result Date: 04/13/2019 °INDICATION: Concern for metastatic lung cancer. Please perform CT-guided biopsy of enlarging hypermetabolic soft tissue between the posterior aspect of left sixth and seventh ribs for tissue diagnostic purposes. EXAM: CT-GUIDED BIOPSY OF HYPERMETABOLIC SOFT TISSUE BETWEEN THE POSTERIOR ASPECTS OF THE LEFT SIXTH AND SEVENTH RIBS. COMPARISON:  Chest CT-03/27/2019; 01/08/2019; PET-CT-03/01/2019 MEDICATIONS: None. ANESTHESIA/SEDATION: Fentanyl 100 mcg IV; Versed 2 mg IV Sedation time: 14 minutes; The patient was  continuously monitored during the procedure by the interventional radiology nurse under my direct supervision. CONTRAST:  None. COMPLICATIONS: None immediate. PROCEDURE: Informed consent was obtained from the patient following an explanation of the procedure, risks, benefits and alternatives. A time out was performed prior to the initiation of the procedure. The patient was positioned prone on the CT table and a limited CT was performed for procedural planning demonstrating unchanged size and appearance of the ill-defined infiltrative soft tissue involving the left posterior chest wall between the left sixth and seventh ribs with dominant soft tissue nodule measuring approximately 2.3 x 1.6 cm (image 17, series 2), previously found to be hypermetabolic on preceding PET-CT. The procedure was planned. The operative site was prepped and draped in the usual sterile fashion. Appropriate trajectory was confirmed with a 22 gauge spinal needle after the adjacent tissues were anesthetized with 1% Lidocaine with epinephrine. Under intermittent CT guidance, a 17 gauge coaxial needle was advanced into the peripheral aspect of the mass. Appropriate positioning was confirmed and 5 core needle biopsy samples were obtained with an 18 gauge core needle biopsy device. The co-axial needle was removed and hemostasis was achieved with manual compression. A limited postprocedural CT was negative for pneumothorax, hemorrhage or additional complication. A dressing was placed. The patient tolerated the procedure well without immediate postprocedural complication. IMPRESSION: Technically successful CT guided core needle biopsy of infiltrative hypermetabolic soft tissue nodule between the posterior aspects of the left sixth and seventh ribs. Electronically Signed   By: John  Watts M.D.   On: 04/13/2019 13:53  ° °Dg Chest Port 1 View ° °Result Date: 04/22/2019 °CLINICAL DATA:  Left lung mass and left lung collapse. EXAM: PORTABLE CHEST 1 VIEW  COMPARISON:  04/20/2019 FINDINGS: Persistent complete opacification of the left hemithorax is again seen right lung remains clear right arm PICC line remains in appropriate position. IMPRESSION: No significant change in complete opacification of left   hemithorax. Electronically Signed   By: John A Stahl M.D.   On: 04/22/2019 06:30  ° °Dg Chest Port 1 View ° °Result Date: 04/20/2019 °CLINICAL DATA:  Respiratory failure.  Lung cancer. EXAM: PORTABLE CHEST 1 VIEW COMPARISON:  Chest x-ray 04/19/2019 FINDINGS: The endotracheal tube and NG tubes have been removed. The right PICC line is stable. Stable complete opacification of the left hemithorax. The right lung is clear. IMPRESSION: 1. Removal of ET and NG tubes.  The right PICC line is stable. 2. Persistent complete opacification of the left hemithorax. Electronically Signed   By: P.  Gallerani M.D.   On: 04/20/2019 07:34  ° °Dg Chest Port 1 View ° °Result Date: 04/19/2019 °CLINICAL DATA:  11/29: Episodes of hypertension As well as bradycardia. Alternating with paroxysmal atrial fibrillation with rapid ventricular response/SVT. Increasing oxygen requirements noted cardiology consulted. Started amiodarone, felt not a candidate for pacing. Developed worsening respiratory failure requiring intubation. Underwent bronchoscopy at bedside showing extensive endobronchial tumor burden in the left upper lobe with extrinsic compression on the left lower lobe.-Having recurrent bradycardia so amiodarone held later in the evening, 11/30: Went back into SVT early a.m. hours. Heart rate 198 205. Amiodarone drip resumed. Also receiving as needed Lopressor. Marked for first XRT12/1 still vent dependent. Having freq atrial arrhythmias but more stable EXAM: PORTABLE CHEST 1 VIEW COMPARISON:  04/18/2019 and earlier exams. FINDINGS: Left hemithorax remains near completely opacified. Mild opacity at the medial right lung base consistent with atelectasis. Right lung otherwise clear. Endotracheal  tube, nasal/orogastric tube and right-sided PICC are stable. No pneumothorax. IMPRESSION: 1. No change from previous day's study. 2. Stable support apparatus. Electronically Signed   By: David  Ormond M.D.   On: 04/19/2019 09:18  ° °Dg Chest Port 1 View ° °Result Date: 04/18/2019 °CLINICAL DATA:  Follow-up exam.  Atelectasis.  Intubated patient. EXAM: PORTABLE CHEST 1 VIEW COMPARISON:  04/17/2019 and earlier exams. FINDINGS: Near complete opacification of the left hemithorax is unchanged from the previous day's exam. Mild opacity at the medial right lung base is noted consistent with atelectasis. Right lung otherwise clear. Endotracheal tube, right PICC and nasal/orogastric tube are stable and well positioned. No pneumothorax. IMPRESSION: 1. No change from the previous day's exam. 2. Persistent near complete opacification of the left hemithorax due to the patient's known mass and postobstructive changes. 3. Stable well-positioned support apparatus. Electronically Signed   By: David  Ormond M.D.   On: 04/18/2019 09:09  ° °Dg Chest Port 1 View ° °Result Date: 04/17/2019 °CLINICAL DATA:  Respiratory failure. EXAM: PORTABLE CHEST 1 VIEW COMPARISON:  One-view chest x-ray 04/15/2019. CT chest 04/10/2019 FINDINGS: The heart is obscured. Endotracheal tube is stable. Right-sided PICC line is stable. NG tube courses off the inferior border of the film. Near complete opacification of left hemithorax is stable. The right lung is clear. IMPRESSION: 1. Stable appearance of the chest with near complete opacification of the left hemithorax reflecting known left lung mass and post obstructive disease. 2. The support apparatus is stable. Electronically Signed   By: Christopher  Mattern M.D.   On: 04/17/2019 07:09  ° °Dg Chest Port 1 View ° °Result Date: 04/15/2019 °CLINICAL DATA:  Status post ET tube placement. EXAM: PORTABLE CHEST 1 VIEW COMPARISON:  Chest radiograph 04/15/2019 FINDINGS: Monitoring leads overlie the patient. ET tube  mid trachea. Enteric tube courses inferior to the diaphragm. Persistent opacification of the left hemithorax. Minimal right basilar atelectasis. Thoracic spine degenerative changes. IMPRESSION: ET tube mid trachea. Persistent opacification of   the left hemithorax. Electronically Signed   By: Lovey Newcomer M.D.   On: 04/15/2019 15:30   Dg Chest Port 1 View  Result Date: 04/15/2019 CLINICAL DATA:  Dyspnea EXAM: PORTABLE CHEST 1 VIEW COMPARISON:  Chest x-rays dated 04/14/2019 03/27/2019. FINDINGS: Continued complete opacification of the LEFT hemithorax. RIGHT lung remains clear. IMPRESSION: 1. Continued complete opacification of the LEFT hemithorax, compatible with previous description of a known malignancy and associated postobstructive atelectasis. 2. RIGHT lung remains clear. Electronically Signed   By: Franki Cabot M.D.   On: 04/15/2019 08:52   Dg Chest Port 1 View  Result Date: 04/14/2019 CLINICAL DATA:  Shortness of breath EXAM: PORTABLE CHEST 1 VIEW COMPARISON:  Chest radiograph 04/10/2019 FINDINGS: Monitoring leads overlie the patient. Interval worsening complete opacification left hemithorax. Minimal right basilar atelectasis. Thoracic spine degenerative changes. IMPRESSION: Interval worsening of opacification of the left hemithorax most compatible with known malignancy and associated postobstructive atelectasis. Electronically Signed   By: Lovey Newcomer M.D.   On: 04/14/2019 17:39   Dg Chest Port 1 View  Result Date: 04/06/2019 CLINICAL DATA:  Patient reports to the ED with complaint of SOB and chest pain. Patient has a hx of lymphoma and reports he has been receiving care. [name] legs have an odor and his skin is dry and flaky EXAM: PORTABLE CHEST 1 VIEW COMPARISON:  10/27/2018.  PET-CT, 03/01/2019. FINDINGS: Near complete opacification of the left hemithorax. This has developed since prior chest radiograph, and since the more recent prior PET-CT. The opacification is consistent combination of  the posterior left upper lobe hypermetabolic mass noted on the prior PET-CT in combination atelectasis or postobstructive consolidation. Pleural fluid is also suspected. Right lung is hyperexpanded, but otherwise clear. No right pleural effusion. No pneumothorax. IMPRESSION: 1. Significant interval worsening left lung aeration. Left hemithorax is now mostly opacified consistent with a combination of the presumed left upper lobe malignancy with postobstructive change/atelectasis and probable pleural fluid. Electronically Signed   By: Lajean Manes M.D.   On: 04/09/2019 14:25   [place] Ekg Site Rite  Result Date: 04/16/2019 If Site Rite image not attached, placement could not be confirmed due to current cardiac rhythm.   ASSESSMENT & PLAN:   This is a 63 year old male with:  1. Extensive stage small cell lung cancer -The patient was noted to have a possible mass on chest x-ray from 10/27/2018. -CT of the chest performed on 8/24/2020demonstrated a 5.9 cm mass in the posterior left upper lobe possible lymphangitic spread and small mediastinal lymph nodes -PET scan performed on 03/01/2019 demonstrated a hypermetabolic 8.5 x 4.8 cm in the posterior left upper lobe of the lung with direct peripheral left stool invasion between the lateral left fifth and sixth ribs and direct left paraspinal invasion at the T4 level. Additionally there were also hypermetabolic ipsilateral mediastinal lymph nodes, hypermetabolic distant nodal metastasis in the left external iliac chain, hypermetabolic posterior left chest wall metastasis between the left sixth and seventh ribs, and hypermetabolic osseous metastases to the left sixth rib. -The patient was admitted to the hospital on 04/11/2019 and a CT of the chest showed new near complete collapse of the left upper lobe secondary to progression of the patient's known left upper lobe lung mass, compressing the left upper lobe bronchus. There also multiple new low-attenuation  masses in the liver concerning for hepatic metastatic disease, progression in the mediastinal lymph nodes, and new pulmonary opacities in the right upper and right lower lobes. -CT-guided biopsy of the hypermetabolic  left posterior chest wall nodule performed on 04/13/2019 demonstrated small cell carcinoma. -The patient initiated radiation to the left lung mass on 04/16/2019. -MRI of the brain on 04/17/2019 demonstrated at least 2 small left cerebellar metastatic lesions and suspected small lesion adjacent to the right frontal horn. -The patient will continue radiation to the left lung mass under the care of Dr. Lisbeth Renshaw. Plan -Patient appears to have tolerated cycle 1 day 1 of carboplatin etoposide and cycle 1 day 2 of etoposide without any immediate prohibitive toxicities -Blood counts stable today -Cycle 1 day 3 etoposide will be today or on Monday 12/7 based on nursing staffing availability. -We shall start Granix daily for 5 days from 04/24/2019 since Neulasta cannot be given as inpatient. -continue and complete his planned course of lung RT as per Dr. Ida Rogue plan.   2.Acute hypoxic respiratory failure due to also lung cancer with associated postobstructive atelectasis/pneumonia and COPD exacerbation -Respiratory status slowly improving -Ongoing management per PCCM-much appreciate their excellent care  3. Mild thrombocytopenia-platelets have improved from 106k to 144k  4. Hypercalcemia of malignancy -Calcium was 14.1 on admission and now corrected -Status post zoledronic acid on 03/24/2019 and he also received calcitonin and IV hydration -Calcium level is now normal -Would check 25-hydroxy vitamin D levels tomorrow a.m. and replace to maintain levels close to 60 to avoid rebound hypocalcemia from his bisphosphonates. -Will need every 4 weekly Zometa to address bone metastases as outpatient.  5. Sinus tachycardia/SVT -On amiodarone  6. Chronic lymphedema -Followed by wound  clinic as an outpatient  7.  DVT prophylaxis -on Lovenox as per PCCM team     Sullivan Lone MD [place] AAHIVMS [facility] Hematology/Oncology Physician [facility]  (Office):       [phone] (Work cell):  [phone] (Fax):           [phone]  04/22/2019 2:06 PM

## 2019-04-22 NOTE — Progress Notes (Signed)
NAME:  Omar Wagner, MRN:  [id number], DOB:  [birth year], LOS: 9 ADMISSION DATE:  04/09/2019, CONSULTATION DATE:  04/13/19 REFERRING MD: Louellen Molder, MD CHIEF COMPLAINT:  Lung mass with mets  Brief History   63 year old male with very severe COPD (FEV1 33%) who presents with acute hypoxemic respiratory failure secondary to post-obstructive pneumonia in setting of LUL compression/collapse from left lung mass .  Past Medical History  Very severe COPD (FEV1 33%) in 2018 Depression Hx basal cell [facility] Events   11/26 Admitted 11/27 CT guided biopsy of soft tissue nodule in the left posterior chest wall 11/28 pulmonary consulted.  Recommended routine bronchodilator therapy antibiotics and prednisone taper 11/29: Episodes of hypertension As well as bradycardia.  Alternating with paroxysmal atrial fibrillation with rapid ventricular response/SVT.  Increasing oxygen requirements noted cardiology consulted.  Started amiodarone, felt not a candidate for pacing.  Developed worsening respiratory failure requiring intubation.  Underwent bronchoscopy at bedside showing extensive endobronchial tumor burden in the left upper lobe with extrinsic compression on the left lower lobe. -Having recurrent bradycardia so amiodarone held later in the evening, 11/30: Went back into SVT early a.m. hours.  Heart rate 198 205.  Amiodarone drip resumed.  Also receiving as needed Lopressor.  Marked for first XRT 12/1 still vent dependent. Having freq atrial arrhythmias but more stable. Weaning.  12/3 extubated  Consults:  TRH IR PCCM Rad/Onc  Procedures:  PICC 11/30>  Significant Diagnostic Tests:  PET 03/01/19 1. Intensely hypermetabolic (max SUV 44.0) large poorly marginated 8.5 x 4.8 cm posterior left upper lobe lung mass with direct peripheral left chest wall invasion between the lateral left fifth and sixth ribs and direct left paraspinal invasion at the T4 level,  significantly increased in size since 01/08/2019 chest CT, compatible with aggressive locally advanced primary bronchogenic carcinoma. 2. Hypermetabolic ipsilateral mediastinal lymph node metastases in the AP window and left paraesophageal chains. 3. Hypermetabolic distant nodal metastasis in the left external iliac chain. 4. Hypermetabolic posterior left chest wall metastasis between left sixth and seventh ribs. 5. Hypermetabolic osseous metastases to the left sixth rib. 6. PET-CT stage IVB (T4 N2 M1c). 7. New 5 mm right upper lobe pulmonary nodule and slight growth of 4 mm right middle lobe pulmonary nodule, below PET resolution, cannot exclude pulmonary metastases. 8. Aortic Atherosclerosis (ICD10-I70.0) and Emphysema (ICD10-J43.9).  CT Chest W Contrast 04/15/2019 1. New near complete collapse of the left upper lobe felt to be secondary to progression of the patient's known left upper lobe lung mass, compressing the left upper lobe bronchus. 2. Findings consistent with significant interval progression of disease as evidenced by new pathologically enlarged mediastinal lymph nodes as well as multiple new low-attenuation masses in the liver concerning for hepatic metastatic disease. In addition, there are new pulmonary opacities in the right upper and right lower lobes raising concern for additional foci of metastatic disease. 3. Consolidation involving the left lung apex concerning for postobstructive pneumonia. 4. Moderate amount of debris within the trachea and bronchi bilaterally. 5. Significant narrowing of the left lower lobe bronchus. There is consolidation involving the left lower lobe with a small left-sided pleural effusion. 6. New osseous involvement involving the posterior fifth and sixth ribs on the left.  2d ECHO 04/15/19 1. Left ventricular ejection fraction, by visual estimation, is 60 to 65%. The left ventricle has normal function. Left ventricular septal wall  thickness was mildly increased. Mildly increased left ventricular posterior wall thickness. There is mildly  increased left ventricular hypertrophy.  2. Patient was extremely tachycardic ranging from 100 to 200 bpm. Left ventricular function appears to be normal.  3. Global right ventricle has normal systolic function.The right ventricular size is normal. No increase in right ventricular wall thickness.  4. Left atrial size was mildly dilated.  5. Right atrial size was severely dilated.  6. The mitral valve is normal in structure. No evidence of mitral valve regurgitation. No evidence of mitral stenosis.  7. The tricuspid valve is normal in structure. Tricuspid valve regurgitation is not demonstrated.  8. The aortic valve is normal in structure. Aortic valve regurgitation is not visualized. No evidence of aortic valve sclerosis or stenosis.  9. The pulmonic valve was normal in structure. Pulmonic valve regurgitation is not visualized. 10. TR signal is inadequate for assessing pulmonary artery systolic pressure. 11. The inferior vena cava is dilated in size with <50% respiratory variability, suggesting right atrial pressure of 15 mmHg.  MRI brain 12/1 >> at least 2 small left cerebellar metastatic lesions, possible small metastatic lesion adjacent to the right frontal horn  Micro Data:  BCx 03/18/2019 >> negative Sputum Cx 04/13/19 >> not done  Antimicrobials:  Vanc 11/26>stopped Cefepime 11/26>stopped Ceftriaxone 11/26>stopped Azithro 11/26>stopped Zosyn 11/26> 12/4  Interim history/subjective:   He is having some left-sided chest discomfort, tightness, some throat irritation.  Denies any nausea or vomiting.  Appetite stable on chemotherapy. He wants to be able to get his bronchodilators at night instead of scheduled 4 times daily during waking hours Had some PACs and intermittent A. fib, MAT overnight, current heart rate 107   Objective   Blood pressure (!) 153/100, pulse (!) 110,  temperature 98 F (36.7 C), temperature source Oral, resp. rate 19, height 6\' 1"  (1.854 m), weight 91.7 kg, SpO2 94 %.        Intake/Output Summary (Last 24 hours) at 04/22/2019 0830 Last data filed at 04/22/2019 6222 Gross per 24 hour  Intake 979.6 ml  Output 1000 ml  Net -20.4 ml   Filed Weights   04/19/19 0450 04/20/19 0412 04/21/19 0401  Weight: 91.4 kg 91.4 kg 91.7 kg   Physical Exam:   General: Chronically ill gentleman, able to converse with mild increase in work of breathing HEENT oropharynx clear, no mucositis, no thrush, poor dentition Pulmonary: Distant on the right, no wheezing, no breath sounds on the left Cardiac: Irregular, 107, no murmur Abdomen: Nondistended, positive bowel sounds Extremities: Some improvement chronic scaly edematous changes bilateral lower extremities Neuro: Awake, alert, interacting, follows commands, answers questions appropriately, well oriented  Resolved Hospital Problem list   VDRF  Assessment & Plan:  63 y/o male with severe COPD (FEV1 33%) admitted with COPD exacerbation secondary to postobstructive pneumonia in the setting of left lung mass   Acute hypoxic and hypercarbic respiratory failure in the setting left upper lobe lung mass, complete left atelectasis Extensive stage small cell lung cancer with associated postobstructive atelectasis/pneumonia COPD exacerbation, improved Plan Continue pulmonary hygiene. 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  So far tolerating chemotherapy and XRT.  Continue as per their schedule.  Chest x-ray today 12/6 reviewed by me, does not show any improvement in left-sided aeration yet. Check  25-hydroxy  vitamin D as per Dr Grier Mitts recs Note recommendation for Granix beginning 04/24/2019, plan for 5 days  intermittent tachycardia/MAT.  Back in NSR 12/5 Plan Amiodarone stopped 12/5 Holding anticoagulation Telemetry monitoring Restart metoprolol 12/6, half home dose, 12.5 mg twice daily   Hyperglycemia (? Steroid induced?), Hgb A1c 5.9 Now with hypoglycemia Plan Thickened liquids, advance diet as SLP evaluation progresses Sliding-scale insulin coverage  Venous stasis dermatitis involving bilateral lower extremities Plan Eucerin cream to bilateral lower extremities  Fluid and electrolyte imbalance: Hypokalemia, hypomagnesemia,Hypophosphatemia Plan  Continue to follow BMP, calcium, phosphorus closely on chemotherapy.  At risk for tumor lysis  Mild thrombocytopenia -Mildly improved Plan Following CBC  Best practice:  Diet: Full liquid diet, thickened, advance when okay with SLP Pain/Anxiety/Delirium protocol (if indicated): Stopped 12/3 VAP protocol (if indicated): Completed DVT prophylaxis: LMWH started 11/29 GI prophylaxis: PPI Glucose control: SSI Mobility: Out of bed with assist, physical therapy Code Status: I spoke with patient 12/4 regarding his underlying disease, prognosis, the risk for recurrent respiratory failure.  I have recommended against reintubation as I do not believe he would survive it to extubation or meaningful recovery.  He understands and agrees.  We will defer MV, CPR but he is open to consider all possible medical therapies and interventions that may help him improve, enhance his quality of life, give him a chance to be discharged out of the hospital to home.  This includes his current plans for radiation, chemotherapy.  Also would include possible BiPAP if it became indicated. Family Communication: Discussed with patient in full.  Disposition: SDU status    Baltazar Apo, MD, PhD 04/22/2019, 8:30 AM [facility]  [phone] or if no answer [phone]

## 2019-04-23 ENCOUNTER — Ambulatory Visit
Admit: 2019-04-23 | Discharge: 2019-04-23 | Disposition: A | Discharge status: Home / Self Care | Payer: Medicare Other | Attending: Radiation Oncology | Admitting: Radiation Oncology

## 2019-04-23 DIAGNOSIS — G893 Neoplasm related pain (acute) (chronic): Secondary | ICD-10-CM

## 2019-04-23 DIAGNOSIS — J432 Centrilobular emphysema: Secondary | ICD-10-CM

## 2019-04-23 DIAGNOSIS — C3490 Malignant neoplasm of unspecified part of unspecified bronchus or lung: Secondary | ICD-10-CM | POA: Diagnosis not present

## 2019-04-23 LAB — GLUCOSE, CAPILLARY
Glucose-Capillary: 69 mg/dL — ABNORMAL LOW (ref 70–99)
Glucose-Capillary: 114 mg/dL — ABNORMAL HIGH (ref 70–99)
Glucose-Capillary: 82 mg/dL (ref 70–99)
Glucose-Capillary: 99 mg/dL (ref 70–99)
Glucose-Capillary: 82 mg/dL (ref 70–99)
Glucose-Capillary: 98 mg/dL (ref 70–99)

## 2019-04-23 LAB — PHOSPHORUS
Phosphorus: 2.3 mg/dL — ABNORMAL LOW (ref 2.5–4.6)
Phosphorus: 1.7 mg/dL — ABNORMAL LOW (ref 2.5–4.6)

## 2019-04-23 LAB — VITAMIN D 25 HYDROXY (VIT D DEFICIENCY, FRACTURES): Vit D, 25-Hydroxy: 15.76 ng/mL — ABNORMAL LOW (ref 30–100)

## 2019-04-23 LAB — CALCIUM
Calcium: 7.1 mg/dL — ABNORMAL LOW (ref 8.9–10.3)
Calcium: 7.2 mg/dL — ABNORMAL LOW (ref 8.9–10.3)

## 2019-04-23 MED ORDER — VITAMIN D 25 MCG (1000 UNIT) PO TABS
1000.0000 [IU] | ORAL_TABLET | Freq: Every day | ORAL | Status: DC
  Administered 2019-04-23 – 2019-04-28 (×6): 1000 [IU] via ORAL
  Filled 2019-04-23 (×6): qty 1

## 2019-04-23 MED ORDER — OXYCODONE HCL 5 MG PO TABS
5.0000 mg | ORAL_TABLET | ORAL | Status: DC | PRN
  Administered 2019-04-23 – 2019-04-29 (×25): 5 mg via ORAL
  Filled 2019-04-23 (×24): qty 1

## 2019-04-23 MED ORDER — KETOROLAC TROMETHAMINE 15 MG/ML IJ SOLN
15.0000 mg | Freq: Once | INTRAMUSCULAR | Status: AC
  Administered 2019-04-23: 15 mg via INTRAVENOUS
  Filled 2019-04-23: qty 1

## 2019-04-23 MED ORDER — POLYETHYLENE GLYCOL 3350 17 G PO PACK
17.0000 g | PACK | Freq: Every day | ORAL | Status: DC
  Filled 2019-04-23 (×5): qty 1

## 2019-04-23 MED ORDER — FENTANYL CITRATE (PF) 100 MCG/2ML IJ SOLN
50.0000 ug | INTRAMUSCULAR | Status: DC | PRN
  Administered 2019-04-23 – 2019-04-29 (×45): 100 ug via INTRAVENOUS
  Filled 2019-04-23 (×45): qty 2

## 2019-04-23 MED ORDER — LEVALBUTEROL HCL 0.63 MG/3ML IN NEBU
0.6300 mg | INHALATION_SOLUTION | Freq: Four times a day (QID) | RESPIRATORY_TRACT | Status: DC
  Administered 2019-04-23 – 2019-04-29 (×24): 0.63 mg via RESPIRATORY_TRACT
  Filled 2019-04-23 (×24): qty 3

## 2019-04-23 MED ORDER — TBO-FILGRASTIM 480 MCG/0.8ML ~~LOC~~ SOSY
480.0000 ug | PREFILLED_SYRINGE | Freq: Every day | SUBCUTANEOUS | Status: AC
  Administered 2019-04-23 – 2019-04-27 (×5): 480 ug via SUBCUTANEOUS
  Filled 2019-04-23 (×5): qty 0.8

## 2019-04-23 MED ORDER — IPRATROPIUM BROMIDE 0.02 % IN SOLN
0.5000 mg | Freq: Four times a day (QID) | RESPIRATORY_TRACT | Status: DC
  Administered 2019-04-23 – 2019-04-29 (×24): 0.5 mg via RESPIRATORY_TRACT
  Filled 2019-04-23 (×24): qty 2.5

## 2019-04-23 MED ORDER — INSULIN ASPART 100 UNIT/ML ~~LOC~~ SOLN: 0.0000 [IU] | Freq: Every day | SUBCUTANEOUS | Status: DC

## 2019-04-23 MED ORDER — INSULIN ASPART 100 UNIT/ML ~~LOC~~ SOLN: 0.0000 [IU] | Freq: Three times a day (TID) | SUBCUTANEOUS | Status: DC

## 2019-04-23 NOTE — Progress Notes (Addendum)
Marland Kitchen   HEMATOLOGY/ONCOLOGY INPATIENT PROGRESS NOTE  Date of Service: 04/23/2019  Inpatient Attending: .Chesley Mires, MD  SUBJECTIVE  Patient seen in the medical ICU for oncology follow-up.  Continues to report some left-sided chest discomfort without acute changes.  Oxygenation about the same.  Severe COPD at baseline. Notes improving p.o. intake.  Reports mild nausea without vomiting.  Some decrease in phlegm production. No fevers no chills.  OBJECTIVE:  No acute distress  PHYSICAL EXAMINATION: . Vitals:   04/23/19 1000 04/23/19 1116 04/23/19 1200 04/23/19 1303  BP:  136/82    Pulse: 88 75 82   Resp: '10 20 19   '$ Temp:  97.6 F (36.4 C)    TempSrc:  Oral    SpO2: 98% 98% 95% 94%  Weight:      Height:       Filed Weights   04/20/19 0412 04/21/19 0401 04/23/19 0500  Weight: 91.4 kg 91.7 kg 93.2 kg   .Body mass index is 27.11 kg/m.  Marland Kitchen GENERAL:alert, in no acute distress, on nasal cannula oxygen SKIN: no acute rashes EYES: conjunctiva are pink and non-injected, sclera anicteric OROPHARYNX: MMM NECK: supple, no JVD LYMPH:  no palpable lymphadenopathy in the cervical, axillary or inguinal regions LUNGS: Decreased air entry bilaterally much more on the left side HEART: regular rate & rhythm ABDOMEN:  normoactive bowel sounds , non tender, not distended. Extremity: 2+ pitting pedal edema with changes of chronic venous stasis/lymphedema PSYCH: alert & oriented x 3 with fluent speech NEURO: no focal motor/sensory deficits    MEDICAL HISTORY:   Past Medical History:  Diagnosis Date   Basal cell carcinoma    Brain metastasis (HCC)    Chronic venous insufficiency    COPD (chronic obstructive pulmonary disease) (HCC)    Depression    History of heroin use    Lung mass    Lymphedema    Paroxysmal SVT (supraventricular tachycardia) ([place])     SURGICAL HISTORY: Past Surgical History:  Procedure Laterality Date   HERNIA REPAIR      SOCIAL HISTORY: Social  History   Socioeconomic History   Marital status: Widowed    Spouse name: Not on file   Number of children: Not on file   Years of education: Not on file   Highest education level: Not on file  Occupational History   Not on file  Social Needs   Financial resource strain: Not on file   Food insecurity    Worry: Not on file    Inability: Not on file   Transportation needs    Medical: Not on file    Non-medical: Not on file  Tobacco Use   Smoking status: Current Some Day Smoker    Packs/day: 1.00    Years: 30.00    Pack years: 30.00    Types: Cigarettes   Smokeless tobacco: Never Used  Substance and Sexual Activity   Alcohol use: Yes    Comment: social   Drug use: Yes    Types: Heroin, IV    Comment: heroin  last year   Sexual activity: Never    Comment: opiate abuse  Lifestyle   Physical activity    Days per week: Not on file    Minutes per session: Not on file   Stress: Not on file  Relationships   Social connections    Talks on phone: Not on file    Gets together: Not on file    Attends religious service: Not on file  Active member of club or organization: Not on file    Attends meetings of clubs or organizations: Not on file    Relationship status: Not on file   Intimate partner violence    Fear of current or ex partner: Not on file    Emotionally abused: Not on file    Physically abused: Not on file    Forced sexual activity: Not on file  Other Topics Concern   Not on file  Social History Narrative   Not on file    FAMILY HISTORY: Family History  Problem Relation Age of Onset   Cancer Other    Breast cancer Mother    Colon cancer Father    Breast cancer Sister    Liver cancer Brother     ALLERGIES:  has No Known Allergies.  MEDICATIONS:  Scheduled Meds:  chlorhexidine gluconate (MEDLINE KIT)  15 mL Mouth Rinse BID   Chlorhexidine Gluconate Cloth  6 each Topical Daily   cholecalciferol  1,000 Units Oral Daily    enoxaparin (LOVENOX) injection  40 mg Subcutaneous Q24H   insulin aspart  0-15 Units Subcutaneous TID WC   insulin aspart  0-5 Units Subcutaneous QHS   ipratropium  0.5 mg Nebulization Q6H   levalbuterol  0.63 mg Nebulization Q6H   mouth rinse  15 mL Mouth Rinse BID   metoprolol tartrate  12.5 mg Oral BID   montelukast  10 mg Oral QHS   polyethylene glycol  17 g Oral Daily   Resource ThickenUp Clear   Oral TID WC & HS   sodium chloride flush  10-40 mL Intracatheter Q12H   Tbo-filgastrim (GRANIX) SQ  480 mcg Subcutaneous q1800   Continuous Infusions:  PRN Meds:.acetaminophen **OR** acetaminophen, fentaNYL (SUBLIMAZE) injection, levalbuterol, ondansetron (ZOFRAN) IV, ondansetron, oxyCODONE, prochlorperazine, prochlorperazine, sodium chloride flush  REVIEW OF SYSTEMS:    10 Point review of Systems was done is negative except as noted above.   LABORATORY DATA:  I have reviewed the data as listed  . CBC Latest Ref Rng & Units 04/22/2019 04/21/2019 04/19/2019  WBC 4.0 - 10.5 K/uL 11.7(H) 9.0 9.7  Hemoglobin 13.0 - 17.0 g/dL 12.9(L) 13.3 13.5  Hematocrit 39.0 - 52.0 % 40.9 41.9 42.5  Platelets 150 - 400 K/uL 155 144(L) 106(L)    . CMP Latest Ref Rng & Units 04/22/2019 04/22/2019 04/22/2019  Glucose 70 - 99 mg/dL - 180(H) 133(H)  BUN 8 - 23 mg/dL - 19 16  Creatinine 0.61 - 1.24 mg/dL - 0.79 0.80  Sodium 135 - 145 mmol/L - 142 138  Potassium 3.5 - 5.1 mmol/L - 3.7 3.9  Chloride 98 - 111 mmol/L - 112(H) 107  CO2 22 - 32 mmol/L - 22 23  Calcium 8.9 - 10.3 mg/dL 7.1(L) 7.0(L) 7.0(L)  Total Protein 6.5 - 8.1 g/dL - - -  Total Bilirubin 0.3 - 1.2 mg/dL - - -  Alkaline Phos 38 - 126 U/L - - -  AST 15 - 41 U/L - - -  ALT 0 - 44 U/L - - -   Component     Latest Ref Rng & Units 04/21/2019  Phosphorus     2.5 - 4.6 mg/dL 3.3  Lactic Acid, Venous     0.5 - 1.9 mmol/L 1.5    RADIOGRAPHIC STUDIES: I have personally reviewed the radiological images as listed and agreed with the  findings in the report. Dg Abd 1 View  Result Date: 04/15/2019 CLINICAL DATA:  Status post OG tube placement. EXAM:  ABDOMEN - 1 VIEW COMPARISON:  None. FINDINGS: OG tube is in place with the tip in the distal stomach. IMPRESSION: As above. Electronically Signed   By: Inge Rise M.D.   On: 04/15/2019 15:51   Ct Chest W Contrast  Result Date: 04/14/2019 CLINICAL DATA:  Shortness of breath and chest pain. History of basal cell carcinoma. EXAM: CT CHEST WITH CONTRAST TECHNIQUE: Multidetector CT imaging of the chest was performed during intravenous contrast administration. CONTRAST:  49m OMNIPAQUE IOHEXOL 300 MG/ML  SOLN COMPARISON:  01/08/2019.  PET-CT dated 03/01/2019 FINDINGS: Cardiovascular: The heart size is stable from prior study. The main pulmonary artery is dilated measuring approximately 3.6 cm in diameter. There is no aortic aneurysm. No thoracic aortic dissection. Mediastinum/Nodes: --there are new pathologically enlarged mediastinal lymph nodes measuring up to approximately 2.3 cm in the short axis. There are pathologically enlarged left hilar lymph nodes. --No axillary lymphadenopathy. --No supraclavicular lymphadenopathy. --Normal thyroid gland. --The esophagus is unremarkable Lungs/Pleura: There is new near complete collapse of the left upper lobe. There is consolidation at the left lung apex which may represent postobstructive pneumonia. The left upper lobe bronchus appears to be occluded. There is significant narrowing of the left lower lobe bronchus. There is some consolidation involving the left lower lobe with a small left-sided pleural effusion. The previously demonstrated left upper lobe lung mass appears to have substantially increased in size but is difficult to distinguish from the collapsed lung. An estimate is that the mass mass measures at least 9.2 x 7.6 cm. There are mild emphysematous changes in the right lung field. There is a growing spiculated 1 cm pulmonary nodule in  the right upper lobe (axial series 5, image 54). There is a significant amount of debris within the trachea and right mainstem bronchus. There is a new pulmonary opacity in the anterior right upper lobe measuring approximately 1.7 cm (axial series 5, image 62). There is some atelectasis versus consolidation involving the right lower lobe. Upper Abdomen: There are new hypoattenuating masses in the liver measuring 1.9 cm in hepatic segment 4A and 1.7 cm in hepatic segment 4 B. Musculoskeletal: There is new destruction of the posterior fifth and sixth ribs on the left consistent with osseous involvement of the known lung mass. IMPRESSION: 1. New near complete collapse of the left upper lobe felt to be secondary to progression of the patient's known left upper lobe lung mass, compressing the left upper lobe bronchus. 2. Findings consistent with significant interval progression of disease as evidenced by new pathologically enlarged mediastinal lymph nodes as well as multiple new low-attenuation masses in the liver concerning for hepatic metastatic disease. In addition, there are new pulmonary opacities in the right upper and right lower lobes raising concern for additional foci of metastatic disease. 3. Consolidation involving the left lung apex concerning for postobstructive pneumonia. 4. Moderate amount of debris within the trachea and bronchi bilaterally. 5. Significant narrowing of the left lower lobe bronchus. There is consolidation involving the left lower lobe with a small left-sided pleural effusion. 6. New osseous involvement involving the posterior fifth and sixth ribs on the left. Electronically Signed   By: CConstance HolsterM.D.   On: 04/14/2019 17:44   Mr BJeri CosWIOContrast  Result Date: 04/17/2019 CLINICAL DATA:  Lung cancer, staging EXAM: MRI HEAD WITHOUT AND WITH CONTRAST TECHNIQUE: Multiplanar, multiecho pulse sequences of the brain and surrounding structures were obtained without and with  intravenous contrast. CONTRAST:  870mGADAVIST GADOBUTROL 1 MMOL/ML IV  SOLN COMPARISON:  None. FINDINGS: Brain: There is a 1 cm peripherally enhancing lesion in the inferior left cerebellum (series 11, image 13). There is a 3 mm left cerebellar lesion on image 15. Questionable additional ill-defined enhancing foci in the left cerebellum, which may be artifactual. Suspected 3 mm enhancing lesion adjacent to the right frontal horn (image 32). No significant associated edema. There is no acute infarction or intracranial hemorrhage. There is no hydrocephalus or extra-axial fluid collection. Prominence of the ventricles and sulci reflects mild generalized parenchymal volume loss. Patchy and confluent T2 hyperintensity in the supratentorial and pontine white matter is nonspecific but probably reflects moderate chronic microvascular ischemic changes. Vascular: Major vessel flow voids at the skull base are preserved. Skull and upper cervical spine: Normal marrow signal is preserved. Sinuses/Orbits: Minor mucosal thickening. Bilateral lens replacements. Other: Sella is unremarkable.  Right mastoid effusion. 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 Suspected small lesion adjacent to the right frontal horn. Additional chronic/nonemergent findings detailed above. Electronically Signed   By: Macy Mis M.D.   On: 04/17/2019 12:27   Ct Biopsy  Result Date: 04/13/2019 INDICATION: Concern for metastatic lung cancer. Please perform CT-guided biopsy of enlarging hypermetabolic soft tissue between the posterior aspect of left sixth and seventh ribs for tissue diagnostic purposes. EXAM: CT-GUIDED BIOPSY OF HYPERMETABOLIC SOFT TISSUE BETWEEN THE POSTERIOR ASPECTS OF THE LEFT SIXTH AND SEVENTH RIBS. COMPARISON:  Chest CT-04/09/2019; 01/08/2019; PET-CT-03/01/2019 MEDICATIONS: None. ANESTHESIA/SEDATION: Fentanyl 100 mcg IV; Versed 2 mg IV Sedation time: 14 minutes; The patient was continuously monitored  during the procedure by the interventional radiology nurse under my direct supervision. CONTRAST:  None. COMPLICATIONS: None immediate. PROCEDURE: Informed consent was obtained from the patient following an explanation of the procedure, risks, benefits and alternatives. A time out was performed prior to the initiation of the procedure. The patient was positioned prone on the CT table and a limited CT was performed for procedural planning demonstrating unchanged size and appearance of the ill-defined infiltrative soft tissue involving the left posterior chest wall between the left sixth and seventh ribs with dominant soft tissue nodule measuring approximately 2.3 x 1.6 cm (image 17, series 2), previously found to be hypermetabolic on preceding PET-CT. The procedure was planned. The operative site was prepped and draped in the usual sterile fashion. Appropriate trajectory was confirmed with a 22 gauge spinal needle after the adjacent tissues were anesthetized with 1% Lidocaine with epinephrine. Under intermittent CT guidance, a 17 gauge coaxial needle was advanced into the peripheral aspect of the mass. Appropriate positioning was confirmed and 5 core needle biopsy samples were obtained with an 18 gauge core needle biopsy device. The co-axial needle was removed and hemostasis was achieved with manual compression. A limited postprocedural CT was negative for pneumothorax, hemorrhage or additional complication. A dressing was placed. The patient tolerated the procedure well without immediate postprocedural complication. IMPRESSION: Technically successful CT guided core needle biopsy of infiltrative hypermetabolic soft tissue nodule between the posterior aspects of the left sixth and seventh ribs. Electronically Signed   By: Sandi Mariscal M.D.   On: 04/13/2019 13:53   Dg Chest Port 1 View  Result Date: 04/22/2019 CLINICAL DATA:  Left lung mass and left lung collapse. EXAM: PORTABLE CHEST 1 VIEW COMPARISON:  04/20/2019  FINDINGS: Persistent complete opacification of the left hemithorax is again seen right lung remains clear right arm PICC line remains in appropriate position. IMPRESSION: No significant change in complete opacification of left hemithorax. Electronically Signed  By: Marlaine Hind M.D.   On: 04/22/2019 06:30   Dg Chest Port 1 View  Result Date: 04/20/2019 CLINICAL DATA:  Respiratory failure.  Lung cancer. EXAM: PORTABLE CHEST 1 VIEW COMPARISON:  Chest x-ray 04/19/2019 FINDINGS: The endotracheal tube and NG tubes have been removed. The right PICC line is stable. Stable complete opacification of the left hemithorax. The right lung is clear. IMPRESSION: 1. Removal of ET and NG tubes.  The right PICC line is stable. 2. Persistent complete opacification of the left hemithorax. Electronically Signed   By: Marijo Sanes M.D.   On: 04/20/2019 07:34   Dg Chest Port 1 View  Result Date: 04/19/2019 CLINICAL DATA:  11/29: Episodes of hypertension As well as bradycardia. Alternating with paroxysmal atrial fibrillation with rapid ventricular response/SVT. Increasing oxygen requirements noted cardiology consulted. Started amiodarone, felt not a candidate for pacing. Developed worsening respiratory failure requiring intubation. Underwent bronchoscopy at bedside showing extensive endobronchial tumor burden in the left upper lobe with extrinsic compression on the left lower lobe.-Having recurrent bradycardia so amiodarone held later in the evening, 11/30: Went back into SVT early a.m. hours. Heart rate 198 205. Amiodarone drip resumed. Also receiving as needed Lopressor. Marked for first XRT12/1 still vent dependent. Having freq atrial arrhythmias but more stable EXAM: PORTABLE CHEST 1 VIEW COMPARISON:  04/18/2019 and earlier exams. FINDINGS: Left hemithorax remains near completely opacified. Mild opacity at the medial right lung base consistent with atelectasis. Right lung otherwise clear. Endotracheal tube, nasal/orogastric  tube and right-sided PICC are stable. No pneumothorax. IMPRESSION: 1. No change from previous day's study. 2. Stable support apparatus. Electronically Signed   By: Lajean Manes M.D.   On: 04/19/2019 09:18   Dg Chest Port 1 View  Result Date: 04/18/2019 CLINICAL DATA:  Follow-up exam.  Atelectasis.  Intubated patient. EXAM: PORTABLE CHEST 1 VIEW COMPARISON:  04/17/2019 and earlier exams. FINDINGS: Near complete opacification of the left hemithorax is unchanged from the previous day's exam. Mild opacity at the medial right lung base is noted consistent with atelectasis. Right lung otherwise clear. Endotracheal tube, right PICC and nasal/orogastric tube are stable and well positioned. No pneumothorax. IMPRESSION: 1. No change from the previous day's exam. 2. Persistent near complete opacification of the left hemithorax due to the patient's known mass and postobstructive changes. 3. Stable well-positioned support apparatus. Electronically Signed   By: Lajean Manes M.D.   On: 04/18/2019 09:09   Dg Chest Port 1 View  Result Date: 04/17/2019 CLINICAL DATA:  Respiratory failure. EXAM: PORTABLE CHEST 1 VIEW COMPARISON:  One-view chest x-ray 04/15/2019. CT chest 04/04/2019 FINDINGS: The heart is obscured. Endotracheal tube is stable. Right-sided PICC line is stable. NG tube courses off the inferior border of the film. Near complete opacification of left hemithorax is stable. The right lung is clear. IMPRESSION: 1. Stable appearance of the chest with near complete opacification of the left hemithorax reflecting known left lung mass and post obstructive disease. 2. The support apparatus is stable. Electronically Signed   By: San Morelle M.D.   On: 04/17/2019 07:09   Dg Chest Port 1 View  Result Date: 04/15/2019 CLINICAL DATA:  Status post ET tube placement. EXAM: PORTABLE CHEST 1 VIEW COMPARISON:  Chest radiograph 04/15/2019 FINDINGS: Monitoring leads overlie the patient. ET tube mid trachea. Enteric  tube courses inferior to the diaphragm. Persistent opacification of the left hemithorax. Minimal right basilar atelectasis. Thoracic spine degenerative changes. IMPRESSION: ET tube mid trachea. Persistent opacification of the left hemithorax. Electronically Signed  By: Lovey Newcomer M.D.   On: 04/15/2019 15:30   Dg Chest Port 1 View  Result Date: 04/15/2019 CLINICAL DATA:  Dyspnea EXAM: PORTABLE CHEST 1 VIEW COMPARISON:  Chest x-rays dated 04/14/2019 03/18/2019. FINDINGS: Continued complete opacification of the LEFT hemithorax. RIGHT lung remains clear. IMPRESSION: 1. Continued complete opacification of the LEFT hemithorax, compatible with previous description of a known malignancy and associated postobstructive atelectasis. 2. RIGHT lung remains clear. Electronically Signed   By: Franki Cabot M.D.   On: 04/15/2019 08:52   Dg Chest Port 1 View  Result Date: 04/14/2019 CLINICAL DATA:  Shortness of breath EXAM: PORTABLE CHEST 1 VIEW COMPARISON:  Chest radiograph 03/25/2019 FINDINGS: Monitoring leads overlie the patient. Interval worsening complete opacification left hemithorax. Minimal right basilar atelectasis. Thoracic spine degenerative changes. IMPRESSION: Interval worsening of opacification of the left hemithorax most compatible with known malignancy and associated postobstructive atelectasis. Electronically Signed   By: Lovey Newcomer M.D.   On: 04/14/2019 17:39   Dg Chest Port 1 View  Result Date: 04/05/2019 CLINICAL DATA:  Patient reports to the ED with complaint of SOB and chest pain. Patient has a hx of lymphoma and reports he has been receiving care. [name] legs have an odor and his skin is dry and flaky EXAM: PORTABLE CHEST 1 VIEW COMPARISON:  10/27/2018.  PET-CT, 03/01/2019. FINDINGS: Near complete opacification of the left hemithorax. This has developed since prior chest radiograph, and since the more recent prior PET-CT. The opacification is consistent combination of the posterior left  upper lobe hypermetabolic mass noted on the prior PET-CT in combination atelectasis or postobstructive consolidation. Pleural fluid is also suspected. Right lung is hyperexpanded, but otherwise clear. No right pleural effusion. No pneumothorax. IMPRESSION: 1. Significant interval worsening left lung aeration. Left hemithorax is now mostly opacified consistent with a combination of the presumed left upper lobe malignancy with postobstructive change/atelectasis and probable pleural fluid. Electronically Signed   By: Lajean Manes M.D.   On: 04/11/2019 14:25   [place] Ekg Site Rite  Result Date: 04/16/2019 If Site Rite image not attached, placement could not be confirmed due to current cardiac rhythm.   ASSESSMENT & PLAN:   This is a 63 year old male with:  1. Extensive stage small cell lung cancer -The patient was noted to have a possible mass on chest x-ray from 10/27/2018. -CT of the chest performed on 8/24/2020demonstrated a 5.9 cm mass in the posterior left upper lobe possible lymphangitic spread and small mediastinal lymph nodes -PET scan performed on 03/01/2019 demonstrated a hypermetabolic 8.5 x 4.8 cm in the posterior left upper lobe of the lung with direct peripheral left stool invasion between the lateral left fifth and sixth ribs and direct left paraspinal invasion at the T4 level. Additionally there were also hypermetabolic ipsilateral mediastinal lymph nodes, hypermetabolic distant nodal metastasis in the left external iliac chain, hypermetabolic posterior left chest wall metastasis between the left sixth and seventh ribs, and hypermetabolic osseous metastases to the left sixth rib. -The patient was admitted to the hospital on 04/04/2019 and a CT of the chest showed new near complete collapse of the left upper lobe secondary to progression of the patient's known left upper lobe lung mass, compressing the left upper lobe bronchus. There also multiple new low-attenuation masses in the liver  concerning for hepatic metastatic disease, progression in the mediastinal lymph nodes, and new pulmonary opacities in the right upper and right lower lobes. -CT-guided biopsy of the hypermetabolic left posterior chest wall nodule performed on  04/13/2019 demonstrated small cell carcinoma. -The patient initiated radiation to the left lung mass on 04/16/2019. -MRI of the brain on 04/17/2019 demonstrated at least 2 small left cerebellar metastatic lesions and suspected small lesion adjacent to the right frontal horn. -The patient will continue radiation to the left lung mass under the care of Dr. Lisbeth Renshaw. Plan -The patient has completed day 3 of cycle 3 of his chemotherapy on 04/23/2019.  He tolerated it well with the exception of mild nausea.  He has antiemetics available to him. -No labs obtained today  -We will begin Granix 480 mcg subcu daily x5 days. -continue and complete his planned course of lung RT as per Dr. Ida Rogue plan.   2.Acute hypoxic respiratory failure due to also lung cancer with associated postobstructive atelectasis/pneumonia and COPD exacerbation -Respiratory status slowly improving -Ongoing management per PCCM-much appreciate their excellent care  3. Mild thrombocytopenia-platelets have improved from 106k to 144k  4. Hypercalcemia of malignancy -Calcium was 14.1 on admission and now corrected -Status post zoledronic acid on 03/25/2019 and he also received calcitonin and IV hydration -Calcium level is now normal -25-hydroxy vitamin D level 15.76 on 04/22/2019.  He has been started on cholecalciferol 1000 units daily with plans to replace to maintain levels close to 60 to avoid rebound hypocalcemia from his bisphosphonates. -Will need every 4 weekly Zometa to address bone metastases as outpatient.  5. Sinus tachycardia/SVT -On metoprolol  6. Chronic lymphedema -Followed by wound clinic as an outpatient  7.  DVT prophylaxis -on Lovenox as per PCCM team   Mikey Bussing, DNP, AGPCNP-BC, AOCNP   04/23/2019 1:16 PM  ADDENDUM  .Patient was Personally and independently interviewed, examined and relevant elements of the history of present illness were reviewed in details and an assessment and plan was created. All elements of the patient's history of present illness , assessment and plan were discussed in details with Mikey Bussing, DNP. The above documentation reflects our combined findings assessment and plan.  Sullivan Lone MD MS

## 2019-04-23 NOTE — Progress Notes (Signed)
  Speech Language Pathology Treatment: Dysphagia  Patient Details Name: Omar Wagner MRN: [id number] DOB: 1956/02/17 Today's Date: 04/23/2019 Time: 4098-1191 SLP Time Calculation (min) (ACUTE ONLY): 25 min  Assessment / Plan / Recommendation Clinical Impression  Pt partially reclined in recliner today and did not allow SLP to sit him up fully for po intake.  His CXR left lung hemithorax - and right clear.  He reports he only uses dentures for cosmetic purposes prior to admission.  Today SLP clinically observed pt consume entire [place] sandwich, large percentage of Ensure and applesauce.  No overt indication of aspiration - however pt with inhalation post-swallow which will increase his aspiration pna risk.  Also frequent belching x3 with thin liquid boluses- pt denies significant reflux.  SLP educated pt to importance of assuring not dyspneic, nor coughing with intake and staying upright after meals especially given his cancer dx, chemo, COPD and decreased mobility - using teach back.   Pt denies dysphagia prior to admit.  Will follow up briefly to assure po tolerance and provide pt with education.    HPI HPI: 63yo male admitted 03/26/2019 with SOB. PMH: severe COPD, lung mass with brain/bone mets. 04/15/2019 declined status, to ICU intubated due to acute hypoxemic respiratory failure d/t post-obstructive PNA, LUL compression/collapse from Left lung mass.  intubated 11/29 - 12/3 CXR = Stable complete opacification of the left hemithorax. The right lung is clear.      SLP Plan  Continue with current plan of care       Recommendations  Diet recommendations: Dysphagia 3 (mechanical soft);Thin liquid Liquids provided via: Cup;Straw;Teaspoon Medication Administration: Whole meds with puree Supervision: Patient able to self feed Compensations: Minimize environmental distractions;Small sips/bites;Slow rate                Oral Care Recommendations: Oral care before and after PO Follow up  Recommendations: Other (comment)(TBD) SLP Visit Diagnosis: Dysphagia, unspecified (R13.10) Plan: Continue with current plan of care       Bonner, Jiyan Walkowski Ann 04/23/2019, 10:12 AM  Luanna Salk, MS [facility] SLP Acute Rehab Services Pager [phone] Office [phone]

## 2019-04-23 NOTE — Progress Notes (Signed)
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  Past Medical History  Depression, basal cell carcinoma, Heroin abuse  Significant Hospital Events   11/26 Admitted 11/27 CT guided biopsy of soft tissue nodule in the left posterior chest wall 11/28 pulmonary consulted.  Recommended routine bronchodilator therapy antibiotics and prednisone taper 11/29 Tachy/brady with HTN and PAF.  Cards consulted >> add amiodarone. VDRF, bronch >> endobronchial tumor in LUL and compression LLL 11/30 recurrent SVT 12/01 Intermittent arrhythmias, but less frequent.  Start vent weaning. 12/03 extubated  Consults:  IR, Oncology, Rad/Onc  Procedures:  PICC 11/30>  Significant Diagnostic Tests:  PET 03/01/19 >> 8.5 cm LUL mass hypermetabolic with invasion Lt chest wall, ipsilateral mediastinal LN met, Lt external iliac chain LN met, mets in Lt 6 and 7 ribs, 5 mm RUL nodule CT Chest W Contrast 04/01/2019 >> collapse LUL, progression of malignancy, post ob PNA Lt lung 2d ECHO 04/15/19 >> EF 60 to 65%, mild LVH, severe RA dilation MRI brain 04/17/19 >> at least 2 small left cerebellar metastatic lesions, possible small metastatic lesion adjacent to the right frontal horn  Micro Data:  BCx 03/23/2019 >> negative  Antimicrobials:  Zosyn 11/26 > 12/03  Interim history/subjective:  C/o pain in Lt side of chest.  Fentanyl helps, but doesn't last.  Wants more solid food to eat.  Objective   Blood pressure (!) 149/102, pulse 79, temperature (!) 97.4 F (36.3 C), temperature source Oral, resp. rate (!) 21, height 6'  1" (1.854 m), weight 93.2 kg, SpO2 97 %.        Intake/Output Summary (Last 24 hours) at 04/23/2019 0741 Last data filed at 04/23/2019 0548 Gross per 24 hour  Intake 1224.1 ml  Output 1625 ml  Net -400.9 ml   Filed Weights   04/20/19 0412 04/21/19 0401 04/23/19 0500  Weight: 91.4 kg 91.7 kg 93.2 kg   Physical Exam:  General - alert Eyes - pupils reactive ENT - no sinus tenderness, no stridor Cardiac - regular rate/rhythm, no murmur Chest - decreased BS on Lt, scattered rhonchi with faint wheeze that clear with coughing Abdomen - soft, non tender, + bowel sounds Extremities - no cyanosis, clubbing, or edema Skin - venous stasis changes in legs Neuro - normal strength, moves extremities, follows commands   Resolved Hospital Problem list   VDRF  Assessment & Plan:   Extensive SCLC with brain met. - oncology, radiation oncology following - started on carboplatin, etoposide - granix per oncology for prophylaxis against chemo induced neutropenia  Cancer pain. - prn tylenol, oxycodone, fentanyl depending on pain level - add bowel regimen while on opiate medications  Acute hypoxic respiratory from [place] with airway compression, post obstructive pneumonia, and severe COPD with emphysema. - completed ABx for pneumonia - COPD exacerbation resolved - continue scheduled atrovent, xopenex, singulair - f/u CXR intermittently - goal SpO2 88 to 95%  Multifocal atrial tachycardia. - amiodarone d/c'ed 12/05 - continue lopressor  Sacral deep tissue pressure injury (not present on admission) Lower leg venous stasis. - Eucerin cream to legs - silicone foam dressing to sacrum  Vit D deficiency. -  vitamin D  Dysphagia. - f/u with speech therapy  Hyperglycemia. - SSI   Best practice:  Diet: Full liquid diet, thickened, advance when okay with SLP DVT prophylaxis: lovenox GI prophylaxis: not indicated Mobility: Out of bed with assist, physical therapy Code Status: okay with  Bipap.  Otherwise DNR/DNI Disposition: SDU status  Labs:   CMP Latest Ref Rng & Units 04/22/2019 04/22/2019 04/22/2019  Glucose 70 - 99 mg/dL - 180(H) 133(H)  BUN 8 - 23 mg/dL - 19 16  Creatinine 0.61 - 1.24 mg/dL - 0.79 0.80  Sodium 135 - 145 mmol/L - 142 138  Potassium 3.5 - 5.1 mmol/L - 3.7 3.9  Chloride 98 - 111 mmol/L - 112(H) 107  CO2 22 - 32 mmol/L - 22 23  Calcium 8.9 - 10.3 mg/dL 7.1(L) 7.0(L) 7.0(L)  Total Protein 6.5 - 8.1 g/dL - - -  Total Bilirubin 0.3 - 1.2 mg/dL - - -  Alkaline Phos 38 - 126 U/L - - -  AST 15 - 41 U/L - - -  ALT 0 - 44 U/L - - -    CBC Latest Ref Rng & Units 04/22/2019 04/21/2019 04/19/2019  WBC 4.0 - 10.5 K/uL 11.7(H) 9.0 9.7  Hemoglobin 13.0 - 17.0 g/dL 12.9(L) 13.3 13.5  Hematocrit 39.0 - 52.0 % 40.9 41.9 42.5  Platelets 150 - 400 K/uL 155 144(L) 106(L)    ABG    Component Value Date/Time   PHART 7.269 (L) 04/15/2019 1545   PCO2ART 55.6 (H) 04/15/2019 1545   PO2ART 114 (H) 04/15/2019 1545   HCO3 24.6 04/15/2019 1545   TCO2 26.1 01/11/2013 0610   ACIDBASEDEF 2.8 (H) 04/15/2019 1545   O2SAT 98.0 04/15/2019 1545    CBG (last 3)  Recent Labs    04/22/19 1601 04/22/19 1948 04/22/19 Randall, MD [place] [facility] 04/23/2019, 8:13 AM

## 2019-04-23 NOTE — Progress Notes (Signed)
NUTRITION NOTE  Alerted by Corliss Marcus that patient does not like and no longer wishes to receive Magic Cups. Patient is now on Dysphagia 3, thin liquids diet and ate 100% of breakfast this AM.  Will d/c supplement and continue to follow per protocol.     Omar Matin, MS, RD, LDN, [facility] Inpatient Clinical Dietitian Pager # [phone] After hours/weekend pager # [phone]

## 2019-04-24 ENCOUNTER — Other Ambulatory Visit: Payer: Self-pay

## 2019-04-24 ENCOUNTER — Ambulatory Visit
Admit: 2019-04-24 | Discharge: 2019-04-24 | Disposition: A | Discharge status: Home / Self Care | Payer: Medicare Other | Attending: Radiation Oncology | Admitting: Radiation Oncology

## 2019-04-24 LAB — GLUCOSE, CAPILLARY
Glucose-Capillary: 121 mg/dL — ABNORMAL HIGH (ref 70–99)
Glucose-Capillary: 76 mg/dL (ref 70–99)
Glucose-Capillary: 92 mg/dL (ref 70–99)
Glucose-Capillary: 123 mg/dL — ABNORMAL HIGH (ref 70–99)
Glucose-Capillary: 122 mg/dL — ABNORMAL HIGH (ref 70–99)
Glucose-Capillary: 132 mg/dL — ABNORMAL HIGH (ref 70–99)
Glucose-Capillary: 83 mg/dL (ref 70–99)

## 2019-04-24 LAB — PHOSPHORUS
Phosphorus: 1.8 mg/dL — ABNORMAL LOW (ref 2.5–4.6)
Phosphorus: 1.6 mg/dL — ABNORMAL LOW (ref 2.5–4.6)

## 2019-04-24 LAB — CBC
HCT: 41.8 % (ref 39.0–52.0)
Hemoglobin: 13.4 g/dL (ref 13.0–17.0)
MCH: 36.4 pg — ABNORMAL HIGH (ref 26.0–34.0)
MCHC: 32.1 g/dL (ref 30.0–36.0)
MCV: 113.6 fL — ABNORMAL HIGH (ref 80.0–100.0)
Platelets: 150 10*3/uL (ref 150–400)
RBC: 3.68 MIL/uL — ABNORMAL LOW (ref 4.22–5.81)
RDW: 12.2 % (ref 11.5–15.5)
WBC: 20.7 10*3/uL — ABNORMAL HIGH (ref 4.0–10.5)
nRBC: 0 % (ref 0.0–0.2)

## 2019-04-24 LAB — BASIC METABOLIC PANEL
Anion gap: 6 (ref 5–15)
BUN: 17 mg/dL (ref 8–23)
CO2: 25 mmol/L (ref 22–32)
Calcium: 7.1 mg/dL — ABNORMAL LOW (ref 8.9–10.3)
Chloride: 113 mmol/L — ABNORMAL HIGH (ref 98–111)
Creatinine, Ser: 0.62 mg/dL (ref 0.61–1.24)
GFR calc Af Amer: >60 mL/min (ref >60)
GFR calc non Af Amer: >60 mL/min (ref >60)
Glucose, Bld: 80 mg/dL (ref 70–99)
Potassium: 3.5 mmol/L (ref 3.5–5.1)
Sodium: 144 mmol/L (ref 135–145)

## 2019-04-24 LAB — CALCIUM: Calcium: 7.1 mg/dL — ABNORMAL LOW (ref 8.9–10.3)

## 2019-04-24 MED ORDER — METOPROLOL TARTRATE 5 MG/5ML IV SOLN
INTRAVENOUS | Status: AC
  Filled 2019-04-24: qty 5

## 2019-04-24 MED ORDER — METOPROLOL TARTRATE 5 MG/5ML IV SOLN
5.0000 mg | Freq: Once | INTRAVENOUS | Status: AC
  Administered 2019-04-24: 5 mg via INTRAVENOUS

## 2019-04-24 NOTE — Progress Notes (Signed)
NAYSHAWN, MESTA ([id number]) Visit Report for 04/04/2019 Arrival Information Details Patient Name: Date of Service: Omar Wagner, Omar Wagner. 04/04/2019 1:00 PM Medical Record [id number] Patient Account Number: [id number] Date of Birth/Sex: Treating RN: 04/06/1956 (63 y.o. Jerilynn Mages) Carlene Coria Primary Care Katrisha Segall: Jack Quarto Other Clinician: Referring Corda Shutt: Treating Jerlyn Pain/Extender:Stone III, Bradd Burner, MARY Weeks in Treatment: 5 Visit Information History Since Last Visit All ordered tests and consults were completed: No Patient Arrived: Ambulatory Added or deleted any medications: No Arrival Time: 13:42 Any new allergies or adverse reactions: No Accompanied By: self Had a fall or experienced change in No Transfer Assistance: None activities of daily living that may affect Patient Identification Verified: Yes risk of falls: Secondary Verification Process Yes Signs or symptoms of abuse/neglect since last No Completed: visito Patient Requires Transmission-Based No Hospitalized since last visit: No Precautions: Implantable device outside of the clinic excluding No Patient Has Alerts: No cellular tissue based products placed in the center since last visit: Has Dressing in Place as Prescribed: Yes Has Compression in Place as Prescribed: Yes Pain Present Now: No Electronic Signature(s) Signed: 04/24/2019 2:50:47 PM By: Carlene Coria RN Entered By: Carlene Coria on 04/04/2019 13:42:43 -------------------------------------------------------------------------------- Clinic Level of Care Assessment Details Patient Name: Date of Service: JACOLBY, RISBY 04/04/2019 1:00 PM Medical Record Number:[id number] Patient Account Number: [id number] Date of Birth/Sex: Treating RN: [birth year] (63 y.o. [name] Primary Care Zakary Kimura: Jack Quarto Other Clinician: Referring Trish Mancinelli: Treating Shanoah Asbill/Extender:Stone III, Bradd Burner, MARY Weeks in Treatment: 5 Clinic Level of  Care Assessment Items TOOL 4 Quantity Score []  - Use when only an EandM is performed on FOLLOW-UP visit 0 ASSESSMENTS - Nursing Assessment / Reassessment X - Reassessment of Co-morbidities (includes updates in patient status) 1 10 X - Reassessment of Adherence to Treatment Plan 1 5 ASSESSMENTS - Wound and Skin Assessment / Reassessment []  - Simple Wound Assessment / Reassessment - one wound 0 []  - Complex Wound Assessment / Reassessment - multiple wounds 0 X - Dermatologic / Skin Assessment (not related to wound area) 1 10 ASSESSMENTS - Focused Assessment X - Circumferential Edema Measurements - multi extremities 2 5 []  - Nutritional Assessment / Counseling / Intervention 0 X - Lower Extremity Assessment (monofilament, tuning fork, pulses) 1 5 []  - Peripheral Arterial Disease Assessment (using hand held doppler) 0 ASSESSMENTS - Ostomy and/or Continence Assessment and Care []  - Incontinence Assessment and Management 0 []  - Ostomy Care Assessment and Management (repouching, etc.) 0 PROCESS - Coordination of Care X - Simple Patient / Family Education for ongoing care 1 15 []  - Complex (extensive) Patient / Family Education for ongoing care 0 X - Staff obtains Programmer, systems, Records, Test Results / Process Orders 1 10 []  - Staff telephones HHA, Nursing Homes / Clarify orders / etc 0 []  - Routine Transfer to another Facility (non-emergent condition) 0 []  - Routine Hospital Admission (non-emergent condition) 0 []  - New Admissions / Biomedical engineer / Ordering NPWT, Apligraf, etc. 0 []  - Emergency Hospital Admission (emergent condition) 0 X - Simple Discharge Coordination 1 10 []  - Complex (extensive) Discharge Coordination 0 PROCESS - Special Needs []  - Pediatric / Minor Patient Management 0 []  - Isolation Patient Management 0 []  - Hearing / Language / Visual special needs 0 []  - Assessment of Community assistance (transportation, D/C planning, etc.) 0 []  - Additional assistance /  Altered mentation 0 []  - Support Surface(s) Assessment (bed, cushion, seat, etc.) 0 INTERVENTIONS - Wound Cleansing / Measurement []  - Simple Wound Cleansing -  one wound 0 []  - Complex Wound Cleansing - multiple wounds 0 []  - Wound Imaging (photographs - any number of wounds) 0 []  - Wound Tracing (instead of photographs) 0 []  - Simple Wound Measurement - one wound 0 []  - Complex Wound Measurement - multiple wounds 0 INTERVENTIONS - Wound Dressings X - Small Wound Dressing one or multiple wounds 2 10 []  - Medium Wound Dressing one or multiple wounds 0 []  - Large Wound Dressing one or multiple wounds 0 []  - Application of Medications - topical 0 []  - Application of Medications - injection 0 INTERVENTIONS - Miscellaneous []  - External ear exam 0 []  - Specimen Collection (cultures, biopsies, blood, body fluids, etc.) 0 []  - Specimen(s) / Culture(s) sent or taken to Lab for analysis 0 []  - Patient Transfer (multiple staff / Civil Service fast streamer / Similar devices) 0 []  - Simple Staple / Suture removal (25 or less) 0 []  - Complex Staple / Suture removal (26 or more) 0 []  - Hypo / Hyperglycemic Management (close monitor of Blood Glucose) 0 []  - Ankle / Brachial Index (ABI) - do not check if billed separately 0 X - Vital Signs 1 5 Has the patient been seen at the hospital within the last three years: Yes Total Score: 100 Level Of Care: New/Established - Level 3 Electronic Signature(s) Signed: 04/04/2019 5:42:21 PM By: Baruch Gouty RN, BSN Entered By: Baruch Gouty on 04/04/2019 14:02:09 -------------------------------------------------------------------------------- Encounter Discharge Information Details Patient Name: Date of Service: [name]. 04/04/2019 1:00 PM Medical Record [id number] Patient Account Number: [id number] Date of Birth/Sex: Treating RN: 09-Sep-1955 (63 y.o. Hessie Diener Primary Care Koreena Joost: Jack Quarto Other Clinician: Referring Shawne Eskelson: Treating  Maudy Yonan/Extender:Stone III, Bradd Burner, MARY Weeks in Treatment: 5 Encounter Discharge Information Items Discharge Condition: Stable Ambulatory Status: Ambulatory Discharge Destination: Home Transportation: Private Auto Accompanied By: self Schedule Follow-up Appointment: No Clinical Summary of Care: Notes ace wrap both legs for patient until home to apply compression stockings. Electronic Signature(s) Signed: 04/04/2019 5:18:27 PM By: [name] Entered By: [name] on 04/04/2019 14:11:31 -------------------------------------------------------------------------------- Lower Extremity Assessment Details Patient Name: Date of Service: TYION, BOYLEN 04/04/2019 1:00 PM Medical Record [id number] Patient Account Number: [id number] Date of Birth/Sex: Treating RN: 07/12/1955 (63 y.o. Jerilynn Mages) Carlene Coria Primary Care Leah Skora: Jack Quarto Other Clinician: Referring Emmalynne Courtney: Treating Levaughn Puccinelli/Extender:Stone III, Bradd Burner, MARY Weeks in Treatment: 5 Edema Assessment Assessed: [Left: No] [Right: No] Edema: [Left: Yes] [Right: Yes] Calf Left: Right: Point of Measurement: 39 cm From Medial Instep cm cm Ankle Left: Right: Point of Measurement: 10 cm From Medial Instep cm cm Electronic Signature(s) Signed: 04/24/2019 2:50:47 PM By: Carlene Coria RN Entered By: Carlene Coria on 04/04/2019 13:43:24 -------------------------------------------------------------------------------- Pain Assessment Details Patient Name: Date of Service: [name]. 04/04/2019 1:00 PM Medical Record [id number] Patient Account Number: [id number] Date of Birth/Sex: Treating RN: 1955-06-20 (63 y.o. Jerilynn Mages) Carlene Coria Primary Care Fatin Bachicha: Jack Quarto Other Clinician: Referring Voula Waln: Treating Jansen Goodpasture/Extender:Stone III, Bradd Burner, MARY Weeks in Treatment: 5 Active Problems Location of Pain Severity and Description of Pain Patient Has Paino No Site Locations Pain Management  and Medication Current Pain Management: Electronic Signature(s) Signed: 04/24/2019 2:50:47 PM By: Carlene Coria RN Entered By: Carlene Coria on 04/04/2019 13:43:09 -------------------------------------------------------------------------------- Patient/Caregiver Education Details Patient Name: [name] 11/18/2020andnbsp1:00 Date of Service: PM Medical Record [id number] Number: Patient Account Number: [id number] Treating RN: 1955-05-18 (62 y.o. Baruch Gouty Date of Birth/Gender: M) Other Clinician: Primary Care Physician: Jack Quarto Primary Care Physician: Jack Quarto Treating  Worthy Keeler Referring Physician: Physician/Extender: Conception Oms in Treatment: 5 Education Assessment Education Provided To: Patient Education Topics Provided Venous: Methods: Explain/Verbal Responses: Reinforcements needed, State content correctly Wound/Skin Impairment: Methods: Explain/Verbal Responses: Reinforcements needed, State content correctly Electronic Signature(s) Signed: 04/04/2019 5:42:21 PM By: Baruch Gouty RN, BSN Entered By: Baruch Gouty on 04/04/2019 13:55:54 -------------------------------------------------------------------------------- [place] Details Patient Name: Date of Service: [name]. 04/04/2019 1:00 PM Medical Record [id number] Patient Account Number: [id number] Date of Birth/Sex: Treating RN: [birth year] (63 y.o. Jerilynn Mages) Carlene Coria Primary Care Arna Luis: Jack Quarto Other Clinician: Referring Corlene Sabia: Treating Shunsuke Granzow/Extender:Stone III, Bradd Burner, MARY Weeks in Treatment: 5 Vital Signs Time Taken: 13:42 Temperature (F): 97.8 Height (in): 72 Pulse (bpm): 76 Weight (lbs): 185 Respiratory Rate (breaths/min): 18 Body Mass Index (BMI): 25.1 Blood Pressure (mmHg): 160/58 Reference Range: 80 - 120 mg / dl Electronic Signature(s) Signed: 04/24/2019 2:50:47 PM By: Carlene Coria RN Entered By: Carlene Coria on 04/04/2019 13:43:04

## 2019-04-24 NOTE — Progress Notes (Signed)
SOMNANG, MAHAN ([id number]) Visit Report for 03/06/2019 Arrival Information Details Patient Name: Date of Service: ZELL, HYLTON 03/06/2019 3:30 PM Medical Record [id number] Patient Account Number: [id number] Date of Birth/Sex: Treating RN: [birth year] (63 y.o. Marvis Repress Primary Care Destinee Taber: Jack Quarto Other Clinician: Referring Yajaira Doffing: Treating Advika Mclelland/Extender:Robson, Wynn Maudlin, MARY Weeks in Treatment: 1 Visit Information History Since Last Visit Added or deleted any medications: No Patient Arrived: Ambulatory Any new allergies or adverse reactions: No Arrival Time: 16:12 Had a fall or experienced change in No Accompanied By: self activities of daily living that may affect Transfer Assistance: None risk of falls: Patient Identification Verified: Yes Signs or symptoms of abuse/neglect since last No Secondary Verification Process Completed: Yes visito Patient Requires Transmission-Based No Hospitalized since last visit: No Precautions: Implantable device outside of the clinic excluding No Patient Has Alerts: No cellular tissue based products placed in the center since last visit: Has Dressing in Place as Prescribed: Yes Has Compression in Place as Prescribed: Yes Pain Present Now: No Electronic Signature(s) Signed: 03/06/2019 6:16:57 PM By: Kela Millin Entered By: Kela Millin on 03/06/2019 16:12:25 -------------------------------------------------------------------------------- Compression Therapy Details Patient Name: Date of Service: [name] 03/06/2019 3:30 PM Medical Record [id number] Patient Account Number: [id number] Date of Birth/Sex: Treating RN: 04-Sep-1955 (63 y.o. Jerilynn Mages) Carlene Coria Primary Care Kaesen Rodriguez: Jack Quarto Other Clinician: Referring Diamon Reddinger: Treating Nida Manfredi/Extender:Robson, Wynn Maudlin, MARY Weeks in Treatment: 1 Compression Therapy Performed for Wound Wound #4 Right,Circumferential  Lower Leg Assessment: Performed By: Clinician Carlene Coria, RN Compression Type: Four Layer Post Procedure Diagnosis Same as Pre-procedure Electronic Signature(s) Signed: 04/24/2019 3:01:15 PM By: Carlene Coria RN Entered By: Carlene Coria on 03/06/2019 17:14:01 -------------------------------------------------------------------------------- Compression Therapy Details Patient Name: Date of Service: [name]. 03/06/2019 3:30 PM Medical Record [id number] Patient Account Number: [id number] Date of Birth/Sex: Treating RN: 1955-07-28 (63 y.o. Oval Linsey Primary Care Chanta Bauers: Jack Quarto Other Clinician: Referring Nadra Hritz: Treating Burle Kwan/Extender:Robson, Wynn Maudlin, MARY Weeks in Treatment: 1 Compression Therapy Performed for Wound Wound #5 Left,Lateral Lower Leg Assessment: Performed By: Clinician Carlene Coria, RN Compression Type: Four Layer Post Procedure Diagnosis Same as Pre-procedure Electronic Signature(s) Signed: 04/24/2019 3:01:15 PM By: Carlene Coria RN Entered By: Carlene Coria on 03/06/2019 17:14:01 -------------------------------------------------------------------------------- Encounter Discharge Information Details Patient Name: Date of Service: [name]. 03/06/2019 3:30 PM Medical Record [id number] Patient Account Number: [id number] Date of Birth/Sex: Treating RN: 04-May-1956 (63 y.o. Marvis Repress Primary Care Mirko Tailor: Jack Quarto Other Clinician: Referring Avilyn Virtue: Treating Raimi Guillermo/Extender:Robson, Wynn Maudlin, MARY Weeks in Treatment: 1 Encounter Discharge Information Items Discharge Condition: Stable Ambulatory Status: Ambulatory Discharge Destination: Home Transportation: Private Auto Accompanied By: friend/gf Schedule Follow-up Appointment: Yes Clinical Summary of Care: Patient Declined Electronic Signature(s) Signed: 03/06/2019 6:16:57 PM By: Kela Millin Entered By: Kela Millin on  03/06/2019 17:52:45 -------------------------------------------------------------------------------- Lower Extremity Assessment Details Patient Name: Date of Service: [name]. 03/06/2019 3:30 PM Medical Record [id number] Patient Account Number: [id number] Date of Birth/Sex: Treating RN: 1955-11-14 (63 y.o. Marvis Repress Primary Care Zavier Canela: Jack Quarto Other Clinician: Referring Tarren Sabree: Treating Akirra Lacerda/Extender:Robson, Wynn Maudlin, MARY Weeks in Treatment: 1 Edema Assessment Assessed: [Left: No] [Right: No] Edema: [Left: Ye] [Right: s] Calf Left: Right: Point of Measurement: 39 cm From Medial Instep 36 cm 37.5 cm Ankle Left: Right: Point of Measurement: 10 cm From Medial Instep 25.5 cm 25.7 cm Vascular Assessment Pulses: Dorsalis Pedis Palpable: [Left:Yes] [Right:Yes] Electronic Signature(s) Signed: 03/06/2019 6:16:57 PM By: Kela Millin Entered By: Kela Millin on 03/06/2019 16:19:17 --------------------------------------------------------------------------------  Multi Wound Chart Details Patient Name: Date of Service: JOAQUIM, TOLEN 03/06/2019 3:30 PM Medical Record [id number] Patient Account Number: [id number] Date of Birth/Sex: Treating RN: October 29, 1955 (63 y.o. Jerilynn Mages) Carlene Coria Primary Care Sefora Tietje: Jack Quarto Other Clinician: Referring Agata Lucente: Treating Angellee Cohill/Extender:Robson, Wynn Maudlin, MARY Weeks in Treatment: 1 Vital Signs Height(in): 72 Pulse(bpm): 70 Weight(lbs): 185 Blood Pressure(mmHg): 153/85 Body Mass Index(BMI): 25 Temperature(F): 98.2 Respiratory 17 Rate(breaths/min): Photos: [4:No Photos] [5:No Photos] [N/A:N/A] Wound Location: [4:Right Lower Leg - Circumferential] [5:Left Lower Leg - Lateral] [N/A:N/A] Wounding Event: [4:Gradually Appeared] [5:Gradually Appeared] [N/A:N/A] Primary Etiology: [4:To be determined] [5:Venous Leg Ulcer] [N/A:N/A] Date Acquired: [4:02/27/2019] [5:02/27/2019]  [N/A:N/A] Weeks of Treatment: [4:1] [5:1] [N/A:N/A] Wound Status: [4:Open] [5:Open] [N/A:N/A] Measurements L x W x D 14x27.3x0.1 [5:10.2x3.2x0.1] [N/A:N/A] (cm) Area (cm) : [4:300.179] [5:25.635] [N/A:N/A] Volume (cm) : [4:30.018] [5:2.564] [N/A:N/A] % Reduction in Area: [4:61.80%] [5:95.50%] [N/A:N/A] % Reduction in Volume: 61.80% [5:95.50%] [N/A:N/A] Classification: [4:Full Thickness Without Exposed Support Structures Exposed Support Structures] [5:Full Thickness Without] [N/A:N/A] Exudate Amount: [4:Large] [5:Medium] [N/A:N/A] Exudate Type: [4:Purulent] [5:Purulent] [N/A:N/A] Exudate Color: [4:yellow, brown, green] [5:yellow, brown, green] [N/A:N/A] Wound Margin: [4:Distinct, outline attached Distinct, outline attached N/A] Granulation Amount: [4:Medium (34-66%)] [5:Medium (34-66%)] [N/A:N/A] Granulation Quality: [4:Pink] [5:Pink] [N/A:N/A] Necrotic Amount: [4:Medium (34-66%)] [5:Medium (34-66%)] [N/A:N/A] Exposed Structures: [4:Fat Layer (Subcutaneous Fat Layer (Subcutaneous N/A Tissue) Exposed: Yes Fascia: No Tendon: No Muscle: No Joint: No Bone: No] [5:Tissue) Exposed: Yes Fascia: No Tendon: No Muscle: No Joint: No Bone: No] Epithelialization: [4:Small (1-33%)] [5:Small (1-33%) Compression Therapy] [N/A:N/A N/A] Treatment Notes Wound #4 (Right, Circumferential Lower Leg) 1. Cleanse With Wound Cleanser Soap and water 2. Periwound Care TCA Cream 3. Primary Dressing Applied Calcium Alginate Ag 4. Secondary Dressing ABD Pad Dry Gauze 6. Support Layer Applied 4 layer compression wrap Notes stretch net. patient was given measurements for legs so he can call to get stockings ordered Wound #5 (Left, Lateral Lower Leg) 1. Cleanse With Wound Cleanser Soap and water 2. Periwound Care TCA Cream 3. Primary Dressing Applied Calcium Alginate Ag 4. Secondary Dressing ABD Pad Dry Gauze 6. Support Layer Applied 4 layer compression wrap Notes stretch net. patient was given  measurements for legs so he can call to get stockings ordered Electronic Signature(s) Signed: 03/06/2019 6:17:56 PM By: Linton Ham MD Signed: 04/24/2019 3:01:15 PM By: Carlene Coria RN Entered By: Linton Ham on 03/06/2019 18:01:51 -------------------------------------------------------------------------------- [place] Details Patient Name: Date of Service: [name]. 03/06/2019 3:30 PM Medical Record [id number] Patient Account Number: [id number] Date of Birth/Sex: Treating RN: 1955/07/07 (63 y.o. Jerilynn Mages) Carlene Coria Primary Care Dantavious Snowball: Jack Quarto Other Clinician: Referring Ayauna Mcnay: Treating Alan Riles/Extender:Robson, Wynn Maudlin, MARY Weeks in Treatment: 1 Active Inactive Wound/Skin Impairment Nursing Diagnoses: Knowledge deficit related to ulceration/compromised skin integrity Goals: Patient/caregiver will verbalize understanding of skin care regimen Date Initiated: 02/27/2019 Target Resolution Date: 03/30/2019 Goal Status: Active Interventions: Assess patient/caregiver ability to obtain necessary supplies Assess patient/caregiver ability to perform ulcer/skin care regimen upon admission and as needed Assess ulceration(s) every visit Notes: Electronic Signature(s) Signed: 04/24/2019 3:01:15 PM By: Carlene Coria RN Entered By: Carlene Coria on 03/06/2019 16:58:21 -------------------------------------------------------------------------------- Pain Assessment Details Patient Name: Date of Service: [name]. 03/06/2019 3:30 PM Medical Record [id number] Patient Account Number: [id number] Date of Birth/Sex: Treating RN: 09-Aug-1955 (63 y.o. Marvis Repress Primary Care Gaytha Raybourn: Jack Quarto Other Clinician: Referring Rohit Deloria: Treating Kaylene Dawn/Extender:Robson, Wynn Maudlin, MARY Weeks in Treatment: 1 Active Problems Location of Pain Severity and Description of Pain Patient Has Paino No  Site Locations Pain  Management and Medication Current Pain Management: Electronic Signature(s) Signed: 03/06/2019 6:16:57 PM By: Kela Millin Entered By: Kela Millin on 03/06/2019 16:14:12 -------------------------------------------------------------------------------- Patient/Caregiver Education Details Patient Name: Date of Service: [name] 10/20/2020andnbsp3:30 PM Medical Record [id number] Patient Account Number: [id number] Date of Birth/Gender: Sep 20, 1955 (63 y.o. M) Treating RN: Carlene Coria Primary Care Physician: Jack Quarto Other Clinician: Referring Physician: Treating Physician/Extender:Robson, Wynn Maudlin, Timoteo Expose in Treatment: 1 Education Assessment Education Provided To: Patient Education Topics Provided Wound/Skin Impairment: Methods: Explain/Verbal Responses: State content correctly Electronic Signature(s) Signed: 04/24/2019 3:01:15 PM By: Carlene Coria RN Entered By: Carlene Coria on 03/06/2019 16:58:33 -------------------------------------------------------------------------------- Wound Assessment Details Patient Name: Date of Service: [name]. 03/06/2019 3:30 PM Medical Record [id number] Patient Account Number: [id number] Date of Birth/Sex: Treating RN: [birth year] (63 y.o. Marvis Repress Primary Care Zackaria Burkey: Jack Quarto Other Clinician: Referring Elverna Caffee: Treating Alexa Golebiewski/Extender:Robson, Wynn Maudlin, MARY Weeks in Treatment: 1 Wound Status Wound Number: 4 Primary Etiology: To be determined Wound Location: Right Lower Leg - Circumferential Wound Status: Open Wounding Event: Gradually Appeared Date Acquired: 02/27/2019 Weeks Of Treatment: 1 Clustered Wound: No Photos Wound Measurements Length: (cm) 14 % Reduct Width: (cm) 27.3 % Reduct Depth: (cm) 0.1 Epitheli Area: (cm) 300.179 Tunneli Volume: (cm) 30.018 Undermi Wound Description Full Thickness Without Exposed Support Classification: Structures Wound  Distinct, outline attached Margin: Exudate Large Amount: Exudate Purulent Type: Exudate yellow, brown, green Color: Wound Bed Granulation Amount: Medium (34-66%) Granulation Quality: Pink Necrotic Amount: Medium (34-66%) Necrotic Quality: Adherent Slough Foul Odor After Cleansing: No Slough/Fibrino Yes Exposed Structure Fascia Exposed: No Fat Layer (Subcutaneous Tissue) Exposed: Yes Tendon Exposed: No Muscle Exposed: No Joint Exposed: No Bone Exposed: No ion in Area: 61.8% ion in Volume: 61.8% alization: Small (1-33%) ng: No ning: No Electronic Signature(s) Signed: 03/08/2019 4:25:07 PM By: Mikeal Hawthorne EMT/HBOT Signed: 03/12/2019 5:21:50 PM By: Kela Millin Previous Signature: 03/06/2019 6:16:57 PM Version By: Kela Millin Entered By: Mikeal Hawthorne on 03/08/2019 14:14:10 -------------------------------------------------------------------------------- Wound Assessment Details Patient Name: Date of Service: [name]. 03/06/2019 3:30 PM Medical Record [id number] Patient Account Number: [id number] Date of Birth/Sex: Treating RN: September 11, 1955 (63 y.o. Marvis Repress Primary Care Ajahnae Rathgeber: Jack Quarto Other Clinician: Referring Jameika Kinn: Treating Sarh Kirschenbaum/Extender:Robson, Wynn Maudlin, MARY Weeks in Treatment: 1 Wound Status Wound Number: 5 Primary Etiology: Venous Leg Ulcer Wound Location: Left Lower Leg - Lateral Wound Status: Open Wounding Event: Gradually Appeared Date Acquired: 02/27/2019 Weeks Of Treatment: 1 Clustered Wound: No Photos Wound Measurements Length: (cm) 10.2 % Reduct Width: (cm) 3.2 % Reduct Depth: (cm) 0.1 Epitheli Area: (cm) 25.635 Tunneli Volume: (cm) 2.564 Undermi Wound Description Classification: Full Thickness Without Exposed Support Foul Od Structures Slough/ Wound Distinct, outline attached Margin: Exudate Medium Amount: Exudate Purulent Type: Exudate yellow, brown, green Color: Wound  Bed Granulation Amount: Medium (34-66%) Granulation Quality: Pink Fascia Necrotic Amount: Medium (34-66%) Fat Lay Necrotic Quality: Adherent Slough Tendon Muscle Joint E Bone Ex or After Cleansing: No Fibrino Yes Exposed Structure Exposed: No er (Subcutaneous Tissue) Exposed: Yes Exposed: No Exposed: No xposed: No posed: No ion in Area: 95.5% ion in Volume: 95.5% alization: Small (1-33%) ng: No ning: No Electronic Signature(s) Signed: 03/08/2019 4:25:07 PM By: Mikeal Hawthorne EMT/HBOT Signed: 03/12/2019 5:21:50 PM By: Kela Millin Previous Signature: 03/06/2019 6:16:57 PM Version By: Kela Millin Entered By: Mikeal Hawthorne on 03/08/2019 14:15:23 -------------------------------------------------------------------------------- Vitals Details Patient Name: Date of Service: [name]. 03/06/2019 3:30 PM Medical Record [id number] Patient Account Number: [id number] Date of  Birth/Sex: Treating RN: [birth year] (63 y.o. Marvis Repress Primary Care Mishka Stegemann: Jack Quarto Other Clinician: Referring Jvon Meroney: Treating Loralee Weitzman/Extender:Robson, Wynn Maudlin, MARY Weeks in Treatment: 1 Vital Signs Time Taken: 16:12 Temperature (F): 98.2 Height (in): 72 Pulse (bpm): 70 Source: Stated Respiratory Rate (breaths/min): 17 Weight (lbs): 185 Blood Pressure (mmHg): 153/85 Source: Stated Reference Range: 80 - 120 mg / dl Body Mass Index (BMI): 25.1 Electronic Signature(s) Signed: 03/06/2019 6:16:57 PM By: Kela Millin Entered By: Kela Millin on 03/06/2019 16:14:03

## 2019-04-24 NOTE — Progress Notes (Signed)
Omar Wagner, Omar Wagner ([id number]) Visit Report for 02/27/2019 Arrival Information Details Patient Name: Date of Service: Omar Wagner, Omar Wagner 02/27/2019 1:15 PM Medical Record [id number] Patient Account Number: [id number] Date of Birth/Sex: Treating RN: December 20, 1955 (63 y.o. Jerilynn Mages) Carlene Coria Primary Care Esco Joslyn: Jack Quarto Other Clinician: Referring Latecia Miler: Treating Elsworth Ledin/Extender:Robson, Wynn Maudlin, MARY Weeks in Treatment: 0 Visit Information History Since Last Visit Added or deleted any medications: No Patient Arrived: Ambulatory Any new allergies or adverse reactions: No Arrival Time: 13:47 Had a fall or experienced change in No Accompanied By: self activities of daily living that may affect Transfer Assistance: None risk of falls: Patient Identification Verified: Yes Signs or symptoms of abuse/neglect since last No Secondary Verification Process Yes visito Completed: Hospitalized since last visit: No Implantable device outside of the clinic excluding No cellular tissue based products placed in the center since last visit: Has Dressing in Place as Prescribed: Yes Pain Present Now: Yes Electronic Signature(s) Signed: 04/24/2019 3:02:15 PM By: Sandre Kitty Entered By: Sandre Kitty on 02/27/2019 13:56:09 -------------------------------------------------------------------------------- Compression Therapy Details Patient Name: Date of Service: Omar Wagner. 02/27/2019 1:15 PM Medical Record [id number] Patient Account Number: [id number] Date of Birth/Sex: Treating RN: 07-26-1955 (63 y.o. Jerilynn Mages) Carlene Coria Primary Care Renesmae Donahey: Jack Quarto Other Clinician: Referring Savera Donson: Treating Masson Nalepa/Extender:Robson, Wynn Maudlin, MARY Weeks in Treatment: 0 Compression Therapy Performed for Wound Wound #4 Right,Circumferential Lower Leg Assessment: Performed By: Clinician Carlene Coria, RN Compression Type: Four Layer Post Procedure Diagnosis Same as  Pre-procedure Electronic Signature(s) Signed: 04/24/2019 3:01:15 PM By: Carlene Coria RN Entered By: Carlene Coria on 02/27/2019 14:55:02 -------------------------------------------------------------------------------- Compression Therapy Details Patient Name: Date of Service: Omar Wagner, Omar Wagner 02/27/2019 1:15 PM Medical Record [id number] Patient Account Number: [id number] Date of Birth/Sex: Treating RN: 1955/05/30 (63 y.o. Jerilynn Mages) Carlene Coria Primary Care Salvator Seppala: Jack Quarto Other Clinician: Referring Shaleah Nissley: Treating Abbee Cremeens/Extender:Robson, Wynn Maudlin, MARY Weeks in Treatment: 0 Compression Therapy Performed for Wound Wound #5 Left,Circumferential Lower Leg Assessment: Performed By: Clinician Carlene Coria, RN Compression Type: Four Layer Post Procedure Diagnosis Same as Pre-procedure Electronic Signature(s) Signed: 04/24/2019 3:01:15 PM By: Carlene Coria RN Entered By: Carlene Coria on 02/27/2019 14:55:02 -------------------------------------------------------------------------------- Lower Extremity Assessment Details Patient Name: Date of Service: Omar Wagner, Omar Wagner 02/27/2019 1:15 PM Medical Record [id number] Patient Account Number: [id number] Date of Birth/Sex: Treating RN: 02/13/1956 (63 y.o. Jerilynn Mages) Carlene Coria Primary Care Satoru Milich: Jack Quarto Other Clinician: Referring Duc Crocket: Treating Tevon Berhane/Extender:Robson, Wynn Maudlin, MARY Weeks in Treatment: 0 Edema Assessment Assessed: [Left: No] [Right: Yes] Edema: [Left: Ye] [Right: s] Calf Left: Right: Point of Measurement: 39 cm From Medial Instep 47 cm 45 cm Ankle Left: Right: Point of Measurement: 10 cm From Medial Instep 27 cm 25 cm Electronic Signature(s) Signed: 04/24/2019 3:01:15 PM By: Carlene Coria RN Entered By: Carlene Coria on 02/27/2019 14:28:36 -------------------------------------------------------------------------------- Omar Wagner Details Patient Name: Date of  Service: Omar Wagner. 02/27/2019 1:15 PM Medical Record [id number] Patient Account Number: [id number] Date of Birth/Sex: Treating RN: December 29, 1955 (63 y.o. Jerilynn Mages) Carlene Coria Primary Care Chelsei Mcchesney: Jack Quarto Other Clinician: Referring Lacie Landry: Treating Joselyne Spake/Extender:Robson, Wynn Maudlin, MARY Weeks in Treatment: 0 Active Inactive Wound/Skin Impairment Nursing Diagnoses: Knowledge deficit related to ulceration/compromised skin integrity Goals: Patient/caregiver will verbalize understanding of skin care regimen Date Initiated: 02/27/2019 Target Resolution Date: 03/30/2019 Goal Status: Active Interventions: Assess patient/caregiver ability to obtain necessary supplies Assess patient/caregiver ability to perform ulcer/skin care regimen upon admission and as needed Assess ulceration(s) every visit Notes: Electronic Signature(s) Signed: 04/24/2019 3:01:15 PM By: Carlene Coria RN Entered  ByCarlene Coria on 02/27/2019 14:20:43 -------------------------------------------------------------------------------- Pain Assessment Details Patient Name: Date of Service: Omar Wagner, Omar Wagner 02/27/2019 1:15 PM Medical Record [id number] Patient Account Number: [id number] Date of Birth/Sex: Treating RN: 1955/08/07 (63 y.o. Jerilynn Mages) Carlene Coria Primary Care Adyan Palau: Other Clinician: Jack Quarto Referring Spring San: Treating Maleya Leever/Extender:Robson, Wynn Maudlin, MARY Weeks in Treatment: 0 Active Problems Location of Pain Severity and Description of Pain Patient Has Paino Yes Site Locations Rate the pain. Current Pain Level: 5 Pain Management and Medication Current Pain Management: Electronic Signature(s) Signed: 04/24/2019 3:01:15 PM By: Carlene Coria RN Signed: 04/24/2019 3:02:15 PM By: Sandre Kitty Entered By: Sandre Kitty on 02/27/2019 13:56:33 -------------------------------------------------------------------------------- Patient/Caregiver Education  Details Patient Name: Omar Wagner 10/13/2020andnbsp1:15 Date of Service: PM Medical Record [id number] Number: Patient Account Number: [id number] Treating RN: Date of Birth/Gender: 1955-08-31 (63 y.o. Oval Linsey) Other Clinician: Primary Care Physician: Jack Quarto Treating Linton Ham Referring Physician: Physician/Extender: Conception Oms in Treatment: 0 Education Assessment Education Provided To: Patient Education Topics Provided Wound/Skin Impairment: Methods: Explain/Verbal Responses: State content correctly Electronic Signature(s) Signed: 04/24/2019 3:01:15 PM By: Carlene Coria RN Entered By: Carlene Coria on 02/27/2019 14:21:22 -------------------------------------------------------------------------------- Wound Assessment Details Patient Name: Date of Service: Omar Wagner. 02/27/2019 1:15 PM Medical Record [id number] Patient Account Number: [id number] Date of Birth/Sex: Treating RN: [birth year] (63 y.o. Oval Linsey Primary Care Marshaun Lortie: Jack Quarto Other Clinician: Referring Doy Taaffe: Treating Somtochukwu Woollard/Extender:Robson, Wynn Maudlin, MARY Weeks in Treatment: 0 Wound Status Wound Number: 4 Primary Etiology: To be determined Wound Location: Right Lower Leg - Circumferential Wound Status: Open Wounding Event: Gradually Appeared Date Acquired: 02/27/2019 Weeks Of Treatment: 0 Clustered Wound: No Photos Wound Measurements Length: (cm) 26.5 % Reduct Width: (cm) 37.8 % Reduct Depth: (cm) 0.1 Tunnelin Area: (cm) 786.733 Undermi Volume: (cm) 78.673 Wound Description Classification: Full Thickness Without Exposed Support Foul Od Structures Slough/ Exudate Large Amount: Exudate Serosanguineous Type: Exudate red, brown Color: Wound Bed Granulation Amount: Medium (34-66%) Granulation Quality: Pink Fascia Necrotic Amount: Medium (34-66%) Fat Lay Necrotic Quality: Adherent Slough Tendon Muscle Joint E Bone Ex or After  Cleansing: No Fibrino Yes Exposed Structure Exposed: No er (Subcutaneous Tissue) Exposed: Yes Exposed: No Exposed: No xposed: No posed: No ion in Area: 0% ion in Volume: 0% g: No ning: No Electronic Signature(s) Signed: 03/21/2019 4:01:42 PM By: Mikeal Hawthorne EMT/HBOT Signed: 04/24/2019 3:01:15 PM By: Carlene Coria RN Entered By: Mikeal Hawthorne on 02/28/2019 09:46:08 -------------------------------------------------------------------------------- Wound Assessment Details Patient Name: Date of Service: Omar Wagner. 02/27/2019 1:15 PM Medical Record [id number] Patient Account Number: [id number] Date of Birth/Sex: Treating RN: 1955/12/02 (63 y.o. Jerilynn Mages) Carlene Coria Primary Care Kahlea Cobert: Jack Quarto Other Clinician: Referring Shevy Yaney: Treating Deserie Dirks/Extender:Robson, Wynn Maudlin, MARY Weeks in Treatment: 0 Wound Status Wound Number: 5 Primary Etiology: Venous Leg Ulcer Wound Location: Left Lower Leg - Circumferential Wound Status: Open Wounding Event: Gradually Appeared Date Acquired: 02/27/2019 Weeks Of Treatment: 0 Clustered Wound: No Photos Wound Measurements Length: (cm) 19 % Reduct Width: (cm) 38.3 % Reduct Depth: (cm) 0.1 Tunnelin Area: (cm) 571.534 Undermi Volume: (cm) 57.153 Wound Description Classification: Full Thickness Without Exposed Support Foul Od Structures Slough/ Exudate Large Amount: Amount: Exudate Serosanguineous Type: Exudate red, brown Color: Wound Bed Granulation Amount: Medium (34-66%) Granulation Quality: Pink Fascia Expos Necrotic Amount: Medium (34-66%) Fat Layer (S Necrotic Quality: Adherent Slough Tendon Expos Muscle Expos Joint Expose Bone Exposed or After Cleansing: No Fibrino Yes Exposed Structure ed: No ubcutaneous Tissue) Exposed: Yes ed: No ed: No d: No : No  ion in Area: 0% ion in Volume: 0% g: No ning: No Electronic Signature(s) Signed: 03/21/2019 4:01:42 PM By: Mikeal Hawthorne EMT/HBOT Signed:  04/24/2019 3:01:15 PM By: Carlene Coria RN Entered By: Mikeal Hawthorne on 02/28/2019 09:46:29 -------------------------------------------------------------------------------- Vitals Details Patient Name: Date of Service: Omar Wagner. 02/27/2019 1:15 PM Medical Record [id number] Patient Account Number: [id number] Date of Birth/Sex: Treating RN: 1955-11-01 (63 y.o. Jerilynn Mages) Dolores Lory, [place] Primary Care Zela Sobieski: Jack Quarto Other Clinician: Referring Iyanni Hepp: Treating Alverta Caccamo/Extender:Robson, Wynn Maudlin, MARY Weeks in Treatment: 0 Vital Signs Time Taken: 13:56 Temperature (F): 98.0 Pulse (bpm): 108 Respiratory Rate (breaths/min): 18 Blood Pressure (mmHg): 129/83 Reference Range: 80 - 120 mg / dl Electronic Signature(s) Signed: 04/24/2019 3:02:15 PM By: Sandre Kitty Entered By: Sandre Kitty on 02/27/2019 13:56:25

## 2019-04-24 NOTE — Progress Notes (Signed)
I called and spoke with Homero Fellers, the patient's sister to let her know the updates with her brother's care and help her connect with the staff at [facility].     [name], PAC

## 2019-04-24 NOTE — Progress Notes (Signed)
[name], [name] ([id number]) Visit Report for 02/27/2019 HPI Details Patient Name: Date of Service: Omar Wagner, Omar Wagner 02/27/2019 1:15 PM Medical Record [id number] Patient Account Number: [id number] Date of Birth/Sex: Treating RN: November 12, 1955 (63 y.o. Jerilynn Mages) Carlene Coria Primary Care Provider: Jack Quarto Other Clinician: Referring Provider: Treating Provider/Extender:Robson, Wynn Maudlin, MARY Weeks in Treatment: 0 History of Present Illness HPI Description: ADMISSION 11/10/2018 This is a 63 year old man who is not a diabetic. Primary chronic diseases golds 3 COPD. He tells me that he has had swelling in his legs for the last 3 years although I note that he had a DVT rule out study in epic in 2014 in any case he started to develop erythema and skin loss in January of this year. He tells me he had some difficulty getting into our clinic, I really do not know what the issue is. He was in the ER on 10/27/2018 noted that he had bilateral leg swelling and weeping. He was diagnosed with lymphedema and referred here. He may have had more recent venous ultrasounds through his primary care but I do not see that in our records. He has had compression stockings ordered in the past but has not picked these up. He is simply been putting drying pads on his legs. He cannot wear long pants because of the weeping edema fluid. Past medical history; Gold 3 COPD, cataracts, PSVT, history of squamous cell CA of the skin, seborrheic keratosis, history of basal cell CA the skin, 2016 admit with cellulitis and abscess of the right lower extremity. Substance abuse He has not had arterial studies and because of the degree of skin loss in his lower extremities we were not able to do ABIs today 7/6- Patient is back after his first week with 3layer compression, swelling is a bit better, has been dressed with silver alginate 7/14; I think things are a little better he still has far too much edema in his lower  extremities chronic venous insufficiency and lymphedema. Large areas bilaterally 7/20; he continues to have very significant improvement in his lower extremity edema which is likely secondary to chronic venous insufficiency and secondary lymphedema. He still has a large area on the left lateral calf and a smaller area on the right lateral calf. He also has systemic fluid volume overload a lot of edema in his thighs also his hands. This was present on admission so I do not think all of this is pushing fluid from his lower legs up into his system 7/28; he continues to have good improvement. He only has a small area left on the right lateral calf. Somewhat more substantial area on the left anterior tibia. Mostly everything looks better here 8/4-Patient returns at 1 week, the area on the right lateral calf seems to have healed, left anterior tibial wound slightly better, left lateral calf 2 small areas appear to be better. We are using Prisma on these wounds under 4 layer compression 8/11-Patient returns at 1 week with small new area on the right lower leg where he says he picked at a loose piece of skin, this does not seem to be much more than small abrasion His left anterior leg wound appears to be about the same where using Silver alginate with 3 layer compression 01/03/2019 on evaluation today patient actually appears to be doing quite well with regard to his bilateral lower extremities. In fact his left lower extremity is the only area where I see an open wound still remaining the right seems to  be completely healed. We have been using the silver alginate dressing with a 3 layer compression. 02/01/2019. The patient's wounds on his bilateral lower extremities are healed. He has dry flaking chronic eczematous skin. Thick patches and scales on predominantly the left leg left heel greater than right. He has been resistant to getting compression stockings although we have emphasized this needs to be  done to maintain his skin integrity. He has not been seen by a doctor here in less than a month but he has been in for nurse visits 02/27/2019; patient returns to clinic with substantial bilateral circumferential epithelial loss. He tells me this happened directly after he left the clinic. He states he did get stockings from elastic therapy. He has not been systemically unwell Electronic Signature(s) Signed: 02/27/2019 5:42:33 PM By: Linton Ham MD Entered By: Linton Ham on 02/27/2019 16:20:15 -------------------------------------------------------------------------------- Physical Exam Details Patient Name: Date of Service: Omar Wagner. 02/27/2019 1:15 PM Medical Record [id number] Patient Account Number: [id number] Date of Birth/Sex: Treating RN: [birth year] (63 y.o. Oval Linsey Primary Care Provider: Jack Quarto Other Clinician: Referring Provider: Treating Provider/Extender:Robson, Wynn Maudlin, MARY Weeks in Treatment: 0 Constitutional Sitting or standing Blood Pressure is within target range for patient.. Pulse regular and within target range for patient.Marland Kitchen Respirations regular, non-labored and within target range.. Temperature is normal and within the target range for the patient.Marland Kitchen Appears in no distress. Eyes Conjunctivae clear. No discharge.no icterus. Respiratory work of breathing is normal. Bilateral breath sounds are clear and equal in all lobes with no wheezes, rales or rhonchi.. Cardiovascular No evidence of CHF. Heart sounds are normal no S3. Pedal pulses are palpable. Gastrointestinal (GI) Abdomen is soft and non-distended without masses or tenderness.. Integumentary (Hair, Skin) Severe bilateral stasis dermatitis edema. Circumferential epithelial loss bilaterally. Psychiatric appears at normal baseline. Notes Wound exam; he has bilateral severe circumferential epithelial loss bilaterally. Nontender. Weeping edema fluid Electronic  Signature(s) Signed: 02/27/2019 5:42:33 PM By: Linton Ham MD Entered By: Linton Ham on 02/27/2019 16:22:32 -------------------------------------------------------------------------------- Physician Orders Details Patient Name: Date of Service: Omar Wagner. 02/27/2019 1:15 PM Medical Record [id number] Patient Account Number: [id number] Date of Birth/Sex: Treating RN: 03-08-1956 (63 y.o. Oval Linsey Primary Care Provider: Jack Quarto Other Clinician: Referring Provider: Treating Provider/Extender:Robson, Wynn Maudlin, MARY Weeks in Treatment: 0 Verbal / Phone Orders: No Diagnosis Coding Follow-up Appointments Return Appointment in 1 week. Nurse Visit: - Thursday Skin Barriers/Peri-Wound Care Moisturizing lotion TCA Cream or Ointment Wound Cleansing May shower with protection. Primary Wound Dressing Wound #4 Right,Circumferential Lower Leg Calcium Alginate with Silver Wound #5 Left,Circumferential Lower Leg Calcium Alginate with Silver Secondary Dressing Wound #4 Right,Circumferential Lower Leg Dry Gauze ABD pad Wound #5 Left,Circumferential Lower Leg Dry Gauze ABD pad Edema Control 4 layer compression - Bilateral Patient to wear own compression stockings - 20-76mmHg compression stockings apply in the am and remove at night. Avoid standing for long periods of time Elevate legs to the level of the heart or above for 30 minutes daily and/or when sitting, a frequency of: - throughout day. Exercise regularly Electronic Signature(s) Signed: 02/27/2019 5:42:33 PM By: Linton Ham MD Signed: 04/24/2019 3:01:15 PM By: Carlene Coria RN Entered By: Carlene Coria on 02/27/2019 14:48:09 -------------------------------------------------------------------------------- Problem List Details Patient Name: Date of Service: Omar Wagner. 02/27/2019 1:15 PM Medical Record [id number] Patient Account Number: [id number] Date of Birth/Sex: Treating  RN: 1955/10/19 (63 y.o. 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 02/27/2019 1:15 PM Medical Record [id number] Patient Account Number: [id number] Date of Birth/Sex: Treating RN: [birth year] (63 y.o. Oval Linsey Primary Care Provider: Jack Quarto Other Clinician: Referring Provider: Treating Provider/Extender:Robson, Wynn Maudlin, MARY Weeks in Treatment: 0 Subjective History of Present Illness (HPI) ADMISSION 11/10/2018 This is a 63 year old man who is not a diabetic. Primary chronic diseases golds 3 COPD. He tells me that he has had swelling in his legs for the last 3 years although I note that he had a DVT rule out study in epic in 2014 in any case he started to develop erythema and skin loss in January of this year. He tells me he had some difficulty getting into our clinic, I really do not know what the issue is. He was in the ER on 10/27/2018 noted that he had bilateral leg swelling and weeping. He was diagnosed with lymphedema and referred here. He may have had more recent venous ultrasounds  through his primary care but I do not see that in our records. He has had compression stockings ordered in the past but has not picked these up. He is simply been putting drying pads on his legs. He cannot wear long pants because of the weeping edema fluid. Past medical history; Gold 3 COPD, cataracts, PSVT, history of squamous cell CA of the skin, seborrheic keratosis, history of basal cell CA the skin, 2016 admit with cellulitis and abscess of the right lower extremity. Substance abuse He has not had arterial studies and because of the degree of skin loss in his lower extremities we were not able to do ABIs today 7/6- Patient is back after his first week with 3layer compression, swelling is a bit better, has been dressed with silver alginate 7/14; I think things are a little better he still has far too much edema in his lower extremities chronic venous insufficiency and lymphedema. Large areas bilaterally 7/20; he continues to have very significant improvement in his lower extremity edema which is likely secondary to chronic venous insufficiency and secondary lymphedema. He still has a large area on the left lateral calf and a smaller area on the right lateral calf. He also has systemic fluid volume overload a lot of edema in his thighs also his hands. This was present on admission so I do not think all of this is pushing fluid from his lower legs up into his system 7/28; he continues to have good improvement. He only has a small area left on the right lateral calf. Somewhat more substantial area on the left anterior tibia. Mostly everything looks better here 8/4-Patient returns at 1 week, the area on the right lateral calf seems to have healed, left anterior tibial wound slightly better, left lateral calf 2 small areas appear to be better. We are using Prisma on these wounds under 4 layer compression 8/11-Patient returns at 1 week with small new area on the right lower  leg where he says he  picked at a loose piece of skin, this does not seem to be much more than small abrasion His left anterior leg wound appears to be about the same where using Silver alginate with 3 layer compression 01/03/2019 on evaluation today patient actually appears to be doing quite well with regard to his bilateral lower extremities. In fact his left lower extremity is the only area where I see an open wound still remaining the right seems to be completely healed. We have been using the silver alginate dressing with a 3 layer compression. 02/01/2019. The patient's wounds on his bilateral lower extremities are healed. He has dry flaking chronic eczematous skin. Thick patches and scales on predominantly the left leg left heel greater than right. He has been resistant to getting compression stockings although we have emphasized this needs to be done to maintain his skin integrity. He has not been seen by a doctor here in less than a month but he has been in for nurse visits 02/27/2019; patient returns to clinic with substantial bilateral circumferential epithelial loss. He tells me this happened directly after he left the clinic. He states he did get stockings from elastic therapy. He has not been systemically unwell Objective Constitutional Sitting or standing Blood Pressure is within target range for patient.. Pulse regular and within target range for patient.Marland Kitchen Respirations regular, non-labored and within target range.. Temperature is normal and within the target range for the patient.Marland Kitchen Appears in no distress. Vitals Time Taken: 1:56 PM, Temperature: 98.0 F, Pulse: 108 bpm, Respiratory Rate: 18 breaths/min, Blood Pressure: 129/83 mmHg. Eyes Conjunctivae clear. No discharge.no icterus. Respiratory work of breathing is normal. Bilateral breath sounds are clear and equal in all lobes with no wheezes, rales or rhonchi.. Cardiovascular No evidence of CHF. Heart sounds are normal no S3. Pedal pulses are  palpable. Gastrointestinal (GI) Abdomen is soft and non-distended without masses or tenderness.Marland Kitchen Psychiatric appears at normal baseline. General Notes: Wound exam; he has bilateral severe circumferential epithelial loss bilaterally. Nontender. Weeping edema fluid Integumentary (Hair, Skin) Severe bilateral stasis dermatitis edema. Circumferential epithelial loss bilaterally. Wound #4 status is Open. Original cause of wound was Gradually Appeared. The wound is located on the Right,Circumferential Lower Leg. The wound measures 26.5cm length x 37.8cm width x 0.1cm depth; 786.733cm^2 area and 78.673cm^3 volume. There is Fat Layer (Subcutaneous Tissue) Exposed exposed. There is no tunneling or undermining noted. There is a large amount of serosanguineous drainage noted. There is medium (34-66%) pink granulation within the wound bed. There is a medium (34-66%) amount of necrotic tissue within the wound bed including Adherent Slough. Wound #5 status is Open. Original cause of wound was Gradually Appeared. The wound is located on the Left,Circumferential Lower Leg. The wound measures 19cm length x 38.3cm width x 0.1cm depth; 571.534cm^2 area and 57.153cm^3 volume. There is Fat Layer (Subcutaneous Tissue) Exposed exposed. There is no tunneling or undermining noted. There is a large amount of serosanguineous drainage noted. There is medium (34-66%) pink granulation within the wound bed. There is a medium (34-66%) amount of necrotic tissue within the wound bed including Adherent Slough. 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 There was a Four Layer Compression Therapy Procedure by Carlene Coria, RN. Post procedure Diagnosis Wound #4: Same as Pre-Procedure Wound #5 Pre-procedure diagnosis of Wound #5 is a Venous Leg Ulcer located on the Left,Circumferential Lower Leg . There was a Four Layer Compression Therapy Procedure by Carlene Coria, RN. Post procedure Diagnosis Wound #5: Same as Pre-Procedure Plan Follow-up Appointments: Return Appointment in 1 week. Nurse Visit: - Thursday Skin Barriers/Peri-Wound Care: Moisturizing lotion TCA Cream or Ointment Wound Cleansing: May shower with protection. Primary Wound Dressing: Wound #4 Right,Circumferential Lower Leg: Calcium Alginate with Silver Wound #5 Left,Circumferential Lower Leg: Calcium Alginate with Silver Secondary Dressing: Wound #4 Right,Circumferential Lower Leg: Dry Gauze ABD pad Wound #5 Left,Circumferential Lower Leg: Dry Gauze ABD pad Edema Control: 4 layer compression - Bilateral Patient to wear own compression stockings - 20-55mmHg compression stockings apply in the am and remove at night. Avoid standing for long periods of time Elevate legs to the level of the heart or above for 30 minutes daily and/or when sitting, a frequency of: - throughout day. Exercise regularly 1. Bilateral TCA and ointment 2. Silver alginate/ABDs 3. 4 layer compression 4. I have asked him to bring the stockings wore next week so we can see them 5. There is intense erythema in his bilateral legs I do not think there is coexistent cellulitis here I think this is all severe stasis dermatitis bilaterally 6. I am going to have him back for a nurse visit Thursday or Friday Electronic Signature(s) Signed: 02/27/2019 5:42:33 PM By: Linton Ham MD Entered By: Linton Ham on 02/27/2019 16:26:34 -------------------------------------------------------------------------------- SuperBill Details Patient Name: Date of  Service: Omar Wagner. 02/27/2019 Medical Record [id number] Patient Account Number: [id number] Date of Birth/Sex: Treating RN: 27-Mar-1956 (63 y.o. Jerilynn Mages) Carlene Coria Primary Care Provider: Jack Quarto Other Clinician: Referring Provider: Treating Provider/Extender:Robson, Wynn Maudlin, MARY Weeks in Treatment: 0 Diagnosis Coding ICD-10 Codes Code Description [phone] Chronic venous hypertension (idiopathic) with ulcer and inflammation of bilateral lower extremity L97.811 Non-pressure chronic ulcer of other part of right lower leg limited to breakdown of skin L97.821 Non-pressure chronic ulcer of other part of left lower leg limited to breakdown of skin Facility Procedures CPT4: Description Modifier Quantity Code [id number] BILATERAL: Application of multi-layer venous compression system; leg 1 (below knee), including ankle and foot. Physician Procedures CPT4: Description Modifier Quantity Code [id number] - WC PHYS LEVEL 4 - EST PT 1 ICD-10 Diagnosis Description I87.333 Chronic venous hypertension (idiopathic) with ulcer and inflammation of bilateral lower extremity L97.811 Non-pressure chronic ulcer  of other part of right lower leg limited to breakdown of skin L97.821 Non-pressure chronic ulcer of other part of left lower leg limited to breakdown of skin Electronic Signature(s) Signed: 02/27/2019 5:42:33 PM By: Linton Ham MD Entered By: Linton Ham on 02/27/2019 16:27:06

## 2019-04-24 NOTE — Progress Notes (Signed)
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 PMH: COPD, chronic lymphadema, COPD (FEV1 33%).  Vent 11/29 - 12/3   OT comments  Pt was intubated/extubated since evaluation was completed. He continues to fatique easily and is functioning at a similar level. Adjusted goals down due to endurance.  Pt did need multimodal cues today for safety.  Feel he will need a 3:1 commode for home use. Will continue to follow with new goals  Follow Up Recommendations  Supervision/Assistance - 24 hour    Equipment Recommendations  3 in 1 bedside commode    Recommendations for Other Services      Precautions / Restrictions Precautions Precautions: Fall Precaution Comments: monitor VS/O2 dependent Restrictions Weight Bearing Restrictions: No       Mobility Bed Mobility Overal bed mobility: Needs Assistance Bed Mobility: Supine to Sit     Supine to sit: Supervision     General bed mobility comments: for lines and safety  Transfers Overall transfer level: Needs assistance Equipment used: Rolling walker (2 wheeled) Transfers: Sit to/from [employer] Transfers Sit to Stand: Min assist;+2 safety/equipment;Min guard Stand pivot transfers: Min guard;Min assist       General transfer comment: cues for hand placement and overall safety    Balance Overall balance assessment: Needs assistance Sitting-balance support: No upper extremity supported;Feet supported Sitting balance-Leahy Scale: Good     Standing balance support: Bilateral upper extremity supported Standing balance-Leahy Scale: Poor Standing balance comment: reliant on UE support                            ADL either performed or assessed with clinical judgement   ADL   Eating/Feeding: Independent   Grooming: Set up   Upper Body Bathing: Minimal assistance   Lower Body Bathing: Maximal assistance   Upper Body Dressing : Moderate assistance   Lower Body Dressing: Maximal assistance   Toilet Transfer: Minimal assistance;+2 for safety/equipment;Ambulation;RW Toilet Transfer Details (indicate cue type and reason): (chair) Toileting- Clothing Manipulation and Hygiene: Minimal assistance         General ADL Comments: pt continues to fatique easily.  He did not want to perform ADLs at this time; increased assistance based on fatique.  He has the ability to reach to feet.     Vision       Perception     Praxis      Cognition Arousal/Alertness: Awake/alert Behavior During Therapy: WFL for tasks assessed/performed Overall Cognitive Status: No family/caregiver present to determine baseline cognitive functioning Area of Impairment: Safety/judgement;Following commands                       Following Commands: Follows one step commands consistently;Follows multi-step commands with increased time Safety/Judgement: Decreased awareness of safety     General Comments: mildly impulsive at times, not always compliant with PT/OT cues (delayed processing as well), vague historian        Exercises     Shoulder Instructions       General Comments      Pertinent Vitals/ Pain       Pain Assessment: No/denies pain  Home Living Family/patient expects to be discharged to::  Private residence Living Arrangements: Non-relatives/Friends Available Help at Discharge: Friend(s);Available PRN/intermittently   Home Access: Stairs to enter Entrance Stairs-Number of Steps: 2   Home Layout: One level     Bathroom Shower/Tub: Teacher, early years/pre: Standard     Home Equipment: None          Prior Functioning/Environment Level of Independence:  Independent with assistive device(s)        Comments: pt is not forth coming with info   Frequency  Min 2X/week        Progress Toward Goals  OT Goals(current goals can now be found in the care plan section)     Acute Rehab OT Goals Patient Stated Goal: agreeable to OOB Time For Goal Achievement: 05/08/19 Potential to Achieve Goals: Good ADL Goals Pt Will Transfer to Toilet: with supervision;ambulating;bedside commode Pt Will Perform Toileting - Clothing Manipulation and hygiene: with supervision;sit to/from stand Additional ADL Goal #1: pt will perform standing grooming task with supervision to increase endurance for adls Additional ADL Goal #2: pt will demonstrate adequate safety during adls/functional transfers with only one verbal cue  Plan      Co-evaluation    PT/OT/SLP Co-Evaluation/Treatment: Yes Reason for Co-Treatment: To address functional/ADL transfers PT goals addressed during session: Mobility/safety with mobility OT goals addressed during session: ADL's and self-care      AM-PAC OT "6 Clicks" Daily Activity     Outcome Measure   Help from another person eating meals?: None Help from another person taking care of personal grooming?: A Little Help from another person toileting, which includes using toliet, bedpan, or urinal?: A Little Help from another person bathing (including washing, rinsing, drying)?: A Lot Help from another person to put on and taking off regular upper body clothing?: A Little Help from another person to put on and taking off regular lower body clothing?: A Lot 6 Click Score: 17    End of Session    OT Visit Diagnosis: Unsteadiness on feet (R26.81)   Activity Tolerance Patient limited by fatigue   Patient Left in chair;with call bell/phone within reach;with chair alarm set   Nurse Communication          Time: 7510-2585 OT Time Calculation (min): 19 min  Charges: OT General Charges $OT Visit: 1 Visit OT  Treatments $Therapeutic Activity: 8-22 mins  Lesle Chris, OTR/L Acute Rehabilitation Services [phone] [facility] pager [phone] office 04/24/2019   [place] 04/24/2019, 11:12 AM

## 2019-04-24 NOTE — Progress Notes (Signed)
TRELL, SECRIST ([id number]) Visit Report for 03/06/2019 HPI Details Patient Name: Date of Service: Omar Wagner, NORDIN 03/06/2019 3:30 PM Medical Record [id number] Patient Account Number: [id number] Date of Birth/Sex: Treating RN: 08-16-1955 (63 y.o. Jerilynn Mages) Carlene Coria Primary Care Provider: Jack Quarto Other Clinician: Referring Provider: Treating Provider/Extender:, Wynn Maudlin, MARY Weeks in Treatment: 1 History of Present Illness HPI Description: ADMISSION 11/10/2018 This is a 63 year old man who is not a diabetic. Primary chronic diseases golds 3 COPD. He tells me that he has had swelling in his legs for the last 3 years although I note that he had a DVT rule out study in epic in 2014 in any case he started to develop erythema and skin loss in January of this year. He tells me he had some difficulty getting into our clinic, I really do not know what the issue is. He was in the ER on 10/27/2018 noted that he had bilateral leg swelling and weeping. He was diagnosed with lymphedema and referred here. He may have had more recent venous ultrasounds through his primary care but I do not see that in our records. He has had compression stockings ordered in the past but has not picked these up. He is simply been putting drying pads on his legs. He cannot wear long pants because of the weeping edema fluid. Past medical history; Gold 3 COPD, cataracts, PSVT, history of squamous cell CA of the skin, seborrheic keratosis, history of basal cell CA the skin, 2016 admit with cellulitis and abscess of the right lower extremity. Substance abuse He has not had arterial studies and because of the degree of skin loss in his lower extremities we were not able to do ABIs today 7/6- Patient is back after his first week with 3layer compression, swelling is a bit better, has been dressed with silver alginate 7/14; I think things are a little better he still has far too much edema in his lower  extremities chronic venous insufficiency and lymphedema. Large areas bilaterally 7/20; he continues to have very significant improvement in his lower extremity edema which is likely secondary to chronic venous insufficiency and secondary lymphedema. He still has a large area on the left lateral calf and a smaller area on the right lateral calf. He also has systemic fluid volume overload a lot of edema in his thighs also his hands. This was present on admission so I do not think all of this is pushing fluid from his lower legs up into his system 7/28; he continues to have good improvement. He only has a small area left on the right lateral calf. Somewhat more substantial area on the left anterior tibia. Mostly everything looks better here 8/4-Patient returns at 1 week, the area on the right lateral calf seems to have healed, left anterior tibial wound slightly better, left lateral calf 2 small areas appear to be better. We are using Prisma on these wounds under 4 layer compression 8/11-Patient returns at 1 week with small new area on the right lower leg where he says he picked at a loose piece of skin, this does not seem to be much more than small abrasion His left anterior leg wound appears to be about the same where using Silver alginate with 3 layer compression 01/03/2019 on evaluation today patient actually appears to be doing quite well with regard to his bilateral lower extremities. In fact his left lower extremity is the only area where I see an open wound still remaining the right seems to  be completely healed. We have been using the silver alginate dressing with a 3 layer compression. 02/01/2019. The patient's wounds on his bilateral lower extremities are healed. He has dry flaking chronic eczematous skin. Thick patches and scales on predominantly the left leg left heel greater than right. He has been resistant to getting compression stockings although we have emphasized this needs to be  done to maintain his skin integrity. He has not been seen by a doctor here in less than a month but he has been in for nurse visits 02/27/2019; patient returns to clinic with substantial bilateral circumferential epithelial loss. He tells me this happened directly after he left the clinic. He states he did get stockings from elastic therapy. He has not been systemically unwell 10/20; the patient was readmitted to the clinic last week. He had bilateral circumferential epithelial loss secondary to severe edema and marked stasis dermatitis right greater than left. I did not give him antibiotics. We put him back in 4 layer compression with TCA. We had him back for a nurse visit. He comes back in clinic and both legs look a lot better. He still has opened areas with loss of surface epithelium Electronic Signature(s) Signed: 03/06/2019 6:17:56 PM By: Linton Ham MD Entered By: Linton Ham on 03/06/2019 18:02:56 -------------------------------------------------------------------------------- Physical Exam Details Patient Name: Date of Service: Omar Wagner 03/06/2019 3:30 PM Medical Record [id number] Patient Account Number: [id number] Date of Birth/Sex: Treating RN: [birth year] (63 y.o. Oval Linsey Primary Care Provider: Jack Quarto Other Clinician: Referring Provider: Treating Provider/Extender:, Wynn Maudlin, MARY Weeks in Treatment: 1 Constitutional Patient is hypertensive.. Pulse regular and within target range for patient.Marland Kitchen Respirations regular, non-labored and within target range.. Temperature is normal and within the target range for the patient.Marland Kitchen Appears in no distress. Respiratory work of breathing is normal. Cardiovascular Pedal pulses are palpable. Much improved edema control severe stasis dermatitis in the right leg. Severe stasis dermatitis in the right leg. Secondary lymphedema. Integumentary (Hair, Skin) stasis dermatits and secondary  llymphedema. Psychiatric appears at normal baseline. Notes Wound exam; his legs look a lot better. There is still intense stasis dermatitis in the right leg but no palpable tenderness. Open areas laterally and medially. The left leg also looks a lot better although there is still some open areas. Electronic Signature(s) Signed: 03/06/2019 6:17:56 PM By: Linton Ham MD Entered By: Linton Ham on 03/06/2019 18:06:14 -------------------------------------------------------------------------------- Physician Orders Details Patient Name: Date of Service: Omar Wagner. 03/06/2019 3:30 PM Medical Record [id number] Patient Account Number: [id number] Date of Birth/Sex: Treating RN: 03/24/1956 (63 y.o. Jerilynn Mages) Carlene Coria Primary Care Provider: Jack Quarto Other Clinician: Referring Provider: Treating Provider/Extender:, Wynn Maudlin, MARY Weeks in Treatment: 1 Verbal / Phone Orders: No Diagnosis Coding ICD-10 Coding Code Description [id number] Chronic venous hypertension (idiopathic) with ulcer and inflammation of bilateral lower extremity L97.811 Non-pressure chronic ulcer of other part of right lower leg limited to breakdown of skin L97.821 Non-pressure chronic ulcer of other part of left lower leg limited to breakdown of skin Follow-up Appointments Return Appointment in 1 week. Skin Barriers/Peri-Wound Care TCA Cream or Ointment Wound Cleansing May shower with protection. Primary Wound Dressing Wound #4 Right,Circumferential Lower Leg Calcium Alginate with Silver Wound #5 Left,Lateral Lower Leg Calcium Alginate with Silver Secondary Dressing Wound #4 Right,Circumferential Lower Leg Dry Gauze ABD pad Wound #5 Left,Lateral Lower Leg Dry Gauze ABD pad Edema Control 4 layer compression - Bilateral Avoid standing for long periods of time Elevate legs to the level of the  heart or above for 30 minutes daily and/or when sitting, a frequency of: - throughout  day. Exercise regularly Electronic Signature(s) Signed: 03/06/2019 6:17:56 PM By: Linton Ham MD Signed: 04/24/2019 3:01:15 PM By: Carlene Coria RN Entered By: Carlene Coria on 03/06/2019 17:15:38 -------------------------------------------------------------------------------- Problem List Details Patient Name: Date of Service: Omar Wagner. 03/06/2019 3:30 PM Medical Record [id number] Patient Account Number: [id number] Date of Birth/Sex: Treating RN: 15-Mar-1956 (63 y.o. Jerilynn Mages) Dolores Lory, [place] Primary Care Provider: Jack Quarto Other Clinician: Referring Provider: Treating Provider/Extender:, Wynn Maudlin, MARY Weeks in Treatment: 1 Active Problems ICD-10 Evaluated Encounter Code Description Active Date Today Diagnosis I87.333 Chronic venous hypertension (idiopathic) with ulcer 02/27/2019 No Yes and inflammation of bilateral lower extremity L97.811 Non-pressure chronic ulcer of other part of right lower 02/27/2019 No Yes leg limited to breakdown of skin L97.821 Non-pressure chronic ulcer of other part of left lower 02/27/2019 No Yes leg limited to breakdown of skin Inactive Problems Resolved Problems Electronic Signature(s) Signed: 03/06/2019 6:17:56 PM By: Linton Ham MD Entered By: Linton Ham on 03/06/2019 18:01:45 -------------------------------------------------------------------------------- Progress Note Details Patient Name: Date of Service: Omar Wagner. 03/06/2019 3:30 PM Medical Record [id number] Patient Account Number: [id number] Date of Birth/Sex: Treating RN: May 29, 1955 (63 y.o. Oval Linsey Primary Care Provider: Jack Quarto Other Clinician: Referring Provider: Treating Provider/Extender:, Wynn Maudlin, MARY Weeks in Treatment: 1 Subjective History of Present Illness (HPI) ADMISSION 11/10/2018 This is a 63 year old man who is not a diabetic. Primary chronic diseases golds 3 COPD. He tells me that he has had  swelling in his legs for the last 3 years although I note that he had a DVT rule out study in epic in 2014 in any case he started to develop erythema and skin loss in January of this year. He tells me he had some difficulty getting into our clinic, I really do not know what the issue is. He was in the ER on 10/27/2018 noted that he had bilateral leg swelling and weeping. He was diagnosed with lymphedema and referred here. He may have had more recent venous ultrasounds through his primary care but I do not see that in our records. He has had compression stockings ordered in the past but has not picked these up. He is simply been putting drying pads on his legs. He cannot wear long pants because of the weeping edema fluid. Past medical history; Gold 3 COPD, cataracts, PSVT, history of squamous cell CA of the skin, seborrheic keratosis, history of basal cell CA the skin, 2016 admit with cellulitis and abscess of the right lower extremity. Substance abuse He has not had arterial studies and because of the degree of skin loss in his lower extremities we were not able to do ABIs today 7/6- Patient is back after his first week with 3layer compression, swelling is a bit better, has been dressed with silver alginate 7/14; I think things are a little better he still has far too much edema in his lower extremities chronic venous insufficiency and lymphedema. Large areas bilaterally 7/20; he continues to have very significant improvement in his lower extremity edema which is likely secondary to chronic venous insufficiency and secondary lymphedema. He still has a large area on the left lateral calf and a smaller area on the right lateral calf. He also has systemic fluid volume overload a lot of edema in his thighs also his hands. This was present on admission so I do not think all of this is pushing fluid from his lower legs up  into his system 7/28; he continues to have good improvement. He only has a small  area left on the right lateral calf. Somewhat more substantial area on the left anterior tibia. Mostly everything looks better here 8/4-Patient returns at 1 week, the area on the right lateral calf seems to have healed, left anterior tibial wound slightly better, left lateral calf 2 small areas appear to be better. We are using Prisma on these wounds under 4 layer compression 8/11-Patient returns at 1 week with small new area on the right lower leg where he says he picked at a loose piece of skin, this does not seem to be much more than small abrasion His left anterior leg wound appears to be about the same where using Silver alginate with 3 layer compression 01/03/2019 on evaluation today patient actually appears to be doing quite well with regard to his bilateral lower extremities. In fact his left lower extremity is the only area where I see an open wound still remaining the right seems to be completely healed. We have been using the silver alginate dressing with a 3 layer compression. 02/01/2019. The patient's wounds on his bilateral lower extremities are healed. He has dry flaking chronic eczematous skin. Thick patches and scales on predominantly the left leg left heel greater than right. He has been resistant to getting compression stockings although we have emphasized this needs to be done to maintain his skin integrity. He has not been seen by a doctor here in less than a month but he has been in for nurse visits 02/27/2019; patient returns to clinic with substantial bilateral circumferential epithelial loss. He tells me this happened directly after he left the clinic. He states he did get stockings from elastic therapy. He has not been systemically unwell 10/20; the patient was readmitted to the clinic last week. He had bilateral circumferential epithelial loss secondary to severe edema and marked stasis dermatitis right greater than left. I did not give him antibiotics. We put him back in  4 layer compression with TCA. We had him back for a nurse visit. He comes back in clinic and both legs look a lot better. He still has opened areas with loss of surface epithelium Objective Constitutional Patient is hypertensive.. Pulse regular and within target range for patient.Marland Kitchen Respirations regular, non-labored and within target range.. Temperature is normal and within the target range for the patient.Marland Kitchen Appears in no distress. Vitals Time Taken: 4:12 PM, Height: 72 in, Source: Stated, Weight: 185 lbs, Source: Stated, BMI: 25.1, Temperature: 98.2 F, Pulse: 70 bpm, Respiratory Rate: 17 breaths/min, Blood Pressure: 153/85 mmHg. Respiratory work of breathing is normal. Cardiovascular Pedal pulses are palpable. Much improved edema control severe stasis dermatitis in the right leg. Severe stasis dermatitis in the right leg. Secondary lymphedema. Psychiatric appears at normal baseline. General Notes: Wound exam; his legs look a lot better. There is still intense stasis dermatitis in the right leg but no palpable tenderness. Open areas laterally and medially. The left leg also looks a lot better although there is still some open areas. Integumentary (Hair, Skin) stasis dermatits and secondary llymphedema. Wound #4 status is Open. Original cause of wound was Gradually Appeared. The wound is located on the Right,Circumferential Lower Leg. The wound measures 14cm length x 27.3cm width x 0.1cm depth; 300.179cm^2 area and 30.018cm^3 volume. There is Fat Layer (Subcutaneous Tissue) Exposed exposed. There is no tunneling or undermining noted. There is a large amount of purulent drainage noted. The wound margin is distinct  with the outline attached to the wound base. There is medium (34-66%) pink granulation within the wound bed. There is a medium (34-66%) amount of necrotic tissue within the wound bed including Adherent Slough. Wound #5 status is Open. Original cause of wound was Gradually Appeared.  The wound is located on the Left,Lateral Lower Leg. The wound measures 10.2cm length x 3.2cm width x 0.1cm depth; 25.635cm^2 area and 2.564cm^3 volume. There is Fat Layer (Subcutaneous Tissue) Exposed exposed. There is no tunneling or undermining noted. There is a medium amount of purulent drainage noted. The wound margin is distinct with the outline attached to the wound base. There is medium (34-66%) pink granulation within the wound bed. There is a medium (34-66%) amount of necrotic tissue within the wound bed including Adherent Slough. 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 There was a Four Layer Compression Therapy Procedure by Carlene Coria, RN. Post procedure Diagnosis Wound #4: Same as Pre-Procedure Wound #5 Pre-procedure diagnosis of Wound #5 is a Venous Leg Ulcer located on the Left,Lateral Lower Leg . There was a Four Layer Compression Therapy Procedure by Carlene Coria, RN. Post procedure Diagnosis Wound #5: Same as Pre-Procedure Plan Follow-up Appointments: Return Appointment in 1 week. Skin Barriers/Peri-Wound Care: TCA Cream or Ointment Wound Cleansing: May shower with protection. Primary Wound Dressing: Wound #4 Right,Circumferential Lower Leg: Calcium Alginate with Silver Wound #5 Left,Lateral Lower Leg: Calcium Alginate with Silver Secondary Dressing: Wound #4 Right,Circumferential Lower Leg: Dry Gauze ABD pad Wound #5 Left,Lateral Lower Leg: Dry Gauze ABD pad Edema Control: 4 layer compression - Bilateral Avoid standing for long periods of time Elevate legs to the  level of the heart or above for 30 minutes daily and/or when sitting, a frequency of: - throughout day. Exercise regularly 1. Continue silver alginate with liberal TCA to both lower extremities 2. ABDs 4-layer compression 3. He did not bring his stockings however it does not sound as though he was actually wearing them reliably when this reopened. He apparently has 20/30 below-knee stockings from elastic therapy in [place] 4. I would like to go over this with him again presumably with the stockings in the room. If he was wearing these reliably then we are going to have to go for something different to control the swelling in this man's legs however I really do not think that was the case Electronic Signature(s) Signed: 03/06/2019 6:17:56 PM By: Linton Ham MD Entered By: Linton Ham on 03/06/2019 18:07:24 -------------------------------------------------------------------------------- SuperBill Details Patient Name: Date of Service: Omar Wagner 03/06/2019 Medical Record [id number] Patient Account Number: [id number] Date of Birth/Sex: Treating RN: 1955/05/28 (63 y.o. Jerilynn Mages) Dolores Lory, [place] Primary Care Provider: Jack Quarto Other Clinician: Referring Provider: Treating Provider/Extender:, Wynn Maudlin, MARY Weeks in Treatment: 1 Diagnosis Coding ICD-10 Codes Code Description [phone] Chronic venous hypertension (idiopathic) with ulcer and inflammation of bilateral lower extremity L97.811 Non-pressure chronic ulcer of other part of right lower leg limited to breakdown of skin L97.821 Non-pressure chronic ulcer of other part of left lower leg limited to breakdown of skin Facility Procedures CPT4: Code [id number] Description: 951 BILATERAL: Application of multi-layer venous compression system; leg (below knee), including ankle and  foot. Modifier Quantity: 1 Physician Procedures CPT4: Description Modifier Quantity Code [id number] 92330 - WC PHYS LEVEL 3 - EST PT 1  ICD-10 Diagnosis Description I87.333 Chronic venous hypertension (idiopathic) with ulcer and inflammation of bilateral lower extremity L97.811 Non-pressure chronic ulcer  of other part of right lower leg limited to breakdown of skin L97.821 Non-pressure chronic ulcer of other part of left lower leg limited to breakdown of skin Electronic Signature(s) Signed: 03/06/2019 6:17:56 PM By: Linton Ham MD Entered By: Linton Ham on 03/06/2019 18:07:38

## 2019-04-24 NOTE — Progress Notes (Signed)
Omar Wagner ([id number]) Visit Report for 03/21/2019 Arrival Information Details Patient Name: Date of Service: Omar, Wagner 03/21/2019 12:30 PM Medical Record [id number] Patient Account Number: [id number] Date of Birth/Sex: Treating RN: [birth year] (63 y.o. Omar Wagner Primary Care Rebekah Sprinkle: Jack Quarto Other Clinician: Referring Adeli Frost: Treating Jamielee Mchale/Extender:Stone III, Bradd Burner, MARY Weeks in Treatment: 3 Visit Information History Since Last Visit Added or deleted any medications: No Patient Arrived: Ambulatory Any new allergies or adverse reactions: No Arrival Time: 13:13 Had a fall or experienced change in No Accompanied By: self activities of daily living that may affect Transfer Assistance: None risk of falls: Patient Identification Verified: Yes Signs or symptoms of abuse/neglect since last No Secondary Verification Process Yes visito Completed: Hospitalized since last visit: No Patient Requires Transmission-Based No Implantable device outside of the clinic excluding No Precautions: cellular tissue based products placed in the center Patient Has Alerts: No since last visit: Has Dressing in Place as Prescribed: Yes Pain Present Now: No Electronic Signature(s) Signed: 04/24/2019 3:00:22 PM By: Sandre Kitty Entered By: Sandre Kitty on 03/21/2019 13:14:05 -------------------------------------------------------------------------------- Compression Therapy Details Patient Name: Date of Service: Omar Hawking. 03/21/2019 12:30 PM Medical Record [id number] Patient Account Number: [id number] Date of Birth/Sex: Treating RN: 11/19/1955 (63 y.o. Omar Wagner Primary Care Mija Effertz: Jack Quarto Other Clinician: Referring Toshi Ishii: Treating Afnan Emberton/Extender:Stone III, Bradd Burner, MARY Weeks in Treatment: 3 Compression Therapy Performed for Wound NonWound Condition Lymphedema - Left Leg Assessment: Performed By: Clinician  [name], RN Compression Type: Four Layer Post Procedure Diagnosis Same as Pre-procedure Electronic Signature(s) Signed: 03/21/2019 6:42:58 PM By: Baruch Gouty RN, BSN Entered By: Baruch Gouty on 03/21/2019 14:11:09 -------------------------------------------------------------------------------- Compression Therapy Details Patient Name: Date of Service: Omar Hawking. 03/21/2019 12:30 PM Medical Record [id number] Patient Account Number: [id number] Date of Birth/Sex: Treating RN: [birth year] (63 y.o. Omar Wagner Primary Care Annarose Ouellet: Jack Quarto Other Clinician: Referring Lisaann Atha: Treating Triana Coover/Extender:Stone III, Bradd Burner, MARY Weeks in Treatment: 3 Compression Therapy Performed for Wound Wound #4 Right,Posterior Lower Leg Assessment: Performed By: Clinician [name], RN Compression Type: Four Layer Post Procedure Diagnosis Same as Pre-procedure Electronic Signature(s) Signed: 03/21/2019 6:42:58 PM By: Baruch Gouty RN, BSN Entered By: Baruch Gouty on 03/21/2019 14:11:27 -------------------------------------------------------------------------------- Encounter Discharge Information Details Patient Name: Date of Service: Omar Hawking. 03/21/2019 12:30 PM Medical Record [id number] Patient Account Number: [id number] Date of Birth/Sex: Treating RN: Jul 08, 1955 (63 y.o. Hessie Diener Primary Care Damarys Speir: Jack Quarto Other Clinician: Referring Aymen Widrig: Treating Khaleef Ruby/Extender:Stone III, Bradd Burner, MARY Weeks in Treatment: 3 Encounter Discharge Information Items Discharge Condition: Stable Ambulatory Status: Ambulatory Discharge Destination: Home Transportation: Private Auto Accompanied By: self Schedule Follow-up Appointment: Yes Clinical Summary of Care: Electronic Signature(s) Signed: 03/21/2019 6:30:51 PM By: [name] Entered By: [name] on 03/21/2019  14:30:51 -------------------------------------------------------------------------------- Lower Extremity Assessment Details Patient Name: Date of Service: Omar Wagner. 03/21/2019 12:30 PM Medical Record [id number] Patient Account Number: [id number] Date of Birth/Sex: Treating RN: 1956/03/23 (63 y.o. Hessie Diener Primary Care Kyerra Vargo: Jack Quarto Other Clinician: Referring Marcoantonio Legault: Treating Vergene Marland/Extender:Stone III, Bradd Burner, MARY Weeks in Treatment: 3 Edema Assessment Assessed: [Left: Yes] [Right: Yes] Edema: [Left: Yes] [Right: Yes] Calf Left: Right: Point of Measurement: 39 cm From Medial Instep 32 cm 37 cm Ankle Left: Right: Point of Measurement: 10 cm From Medial Instep 23 cm 24 cm Electronic Signature(s) Signed: 03/21/2019 6:30:51 PM By: [name] Entered By: [name] on 03/21/2019 13:45:50 -------------------------------------------------------------------------------- Multi-Disciplinary Care Plan Details Patient Name: Date of Service:  Omar Decamp W. 03/21/2019 12:30 PM Medical Record [id number] Patient Account Number: [id number] Date of Birth/Sex: Treating RN: October 15, 1955 (63 y.o. Omar Wagner Primary Care Kailia Starry: Jack Quarto Other Clinician: Referring Pasqual Farias: Treating Arneisha Kincannon/Extender:Stone III, Bradd Burner, MARY Weeks in Treatment: 3 Active Inactive Venous Leg Ulcer Nursing Diagnoses: Knowledge deficit related to disease process and management Potential for venous Insuffiency (use before diagnosis confirmed) Goals: Patient will maintain optimal edema control Date Initiated: 03/21/2019 Target Resolution Date: 04/18/2019 Goal Status: Active Interventions: Compression as ordered Provide education on venous insufficiency Treatment Activities: Therapeutic compression applied : 03/21/2019 Notes: Wound/Skin Impairment Nursing Diagnoses: Knowledge deficit related to ulceration/compromised skin  integrity Goals: Patient/caregiver will verbalize understanding of skin care regimen Date Initiated: 02/27/2019 Target Resolution Date: 03/30/2019 Goal Status: Active Interventions: Assess patient/caregiver ability to obtain necessary supplies Assess patient/caregiver ability to perform ulcer/skin care regimen upon admission and as needed Assess ulceration(s) every visit Notes: Electronic Signature(s) Signed: 03/21/2019 6:42:58 PM By: Baruch Gouty RN, BSN Entered By: Baruch Gouty on 03/21/2019 14:09:37 -------------------------------------------------------------------------------- Pain Assessment Details Patient Name: Date of Service: Omar Hawking. 03/21/2019 12:30 PM Medical Record [id number] Patient Account Number: [id number] Date of Birth/Sex: Treating RN: 11/03/1955 (63 y.o. Omar Wagner Primary Care Coraima Tibbs: Jack Quarto Other Clinician: Referring Mercedez Boule: Treating Monte Zinni/Extender:Stone III, Bradd Burner, MARY Weeks in Treatment: 3 Active Problems Location of Pain Severity and Description of Pain Patient Has Paino No Site Locations Pain Management and Medication Current Pain Management: Electronic Signature(s) Signed: 03/21/2019 6:42:58 PM By: Baruch Gouty RN, BSN Signed: 04/24/2019 3:00:22 PM By: Sandre Kitty Entered By: Sandre Kitty on 03/21/2019 13:14:27 -------------------------------------------------------------------------------- Patient/Caregiver Education Details Patient Name: Omar Hawking 11/4/2020andnbsp12:30 Date of Service: PM Medical Record [id number] Number: Patient Account Number: [id number] Treating RN: [birth year] (63 y.o. Baruch Gouty Date of Birth/Gender: M) Other Clinician: Primary Care Physician: Jack Quarto Treating Worthy Keeler Referring Physician: Physician/Extender: Conception Oms in Treatment: 3 Education Assessment Education Provided To: Patient Education Topics  Provided Venous: Methods: Explain/Verbal Responses: Reinforcements needed, State content correctly Wound/Skin Impairment: Methods: Explain/Verbal Responses: Reinforcements needed, State content correctly Electronic Signature(s) Signed: 03/21/2019 6:42:58 PM By: Baruch Gouty RN, BSN Entered By: Baruch Gouty on 03/21/2019 14:10:00 -------------------------------------------------------------------------------- Wound Assessment Details Patient Name: Date of Service: Omar Hawking. 03/21/2019 12:30 PM Medical Record [id number] Patient Account Number: [id number] Date of Birth/Sex: Treating RN: [birth year] (63 y.o. Omar Wagner Primary Care Britney Captain: Jack Quarto Other Clinician: Referring Beau Ramsburg: Treating Mayleigh Tetrault/Extender:Stone III, Bradd Burner, MARY Weeks in Treatment: 3 Wound Status Wound Number: 4 Primary Venous Leg Ulcer Etiology: Wound Location: Right Lower Leg - Posterior Wound Open Wounding Event: Gradually Appeared Status: Date Acquired: 02/27/2019 Comorbid Lymphedema, Chronic Obstructive Weeks Of Treatment: 3 History: Pulmonary Disease (COPD) Clustered Wound: No Photos Wound Measurements Length: (cm) 0.7 % Reduct Width: (cm) 0.5 % Reduct Depth: (cm) 0.1 Epitheli Area: (cm) 0.275 Tunneli Volume: (cm) 0.027 Undermi Wound Description Full Thickness Without Exposed Support Foul Od Classification: Structures Slough/ Wound Distinct, outline attached Margin: Exudate Medium Amount: Exudate Serosanguineous Type: Exudate red, brown Color: Wound Bed Granulation Amount: Large (67-100%) Granulation Quality: Pink Fascia Necrotic Amount: Small (1-33%) Fat Lay Necrotic Quality: Adherent Slough Tendon Muscle Joint E Bone Ex or After Cleansing: No Fibrino Yes Exposed Structure Exposed: No er (Subcutaneous Tissue) Exposed: Yes Exposed: No Exposed: No xposed: No posed: No ion in Area: 100% ion in Volume: 100% alization: Large  (67-100%) ng: No ning: No Electronic Signature(s) Signed: 03/23/2019 6:12:06 PM By: Baruch Gouty RN, BSN Signed: 03/26/2019 4:06:15  PM By: Mikeal Hawthorne EMT/HBOT Previous Signature: 03/21/2019 6:30:51 PM Version By: [name] Previous Signature: 03/21/2019 6:42:58 PM Version By: Baruch Gouty RN, BSN Entered By: Mikeal Hawthorne on 03/23/2019 09:36:52 -------------------------------------------------------------------------------- Wound Assessment Details Patient Name: Date of Service: Omar Hawking. 03/21/2019 12:30 PM Medical Record [id number] Patient Account Number: [id number] Date of Birth/Sex: Treating RN: 01/27/1956 (63 y.o. Omar Wagner Primary Care Wava Kildow: Jack Quarto Other Clinician: Referring Georgean Spainhower: Treating Kaiana Marion/Extender:Stone III, Bradd Burner, MARY Weeks in Treatment: 3 Wound Status Wound Number: 5 Primary Venous Leg Ulcer Etiology: Wound Location: Left Lower Leg - Lateral Wound Healed - Epithelialized Wounding Event: Gradually Appeared Status: Date Acquired: 02/27/2019 Comorbid Lymphedema, Chronic Obstructive Weeks Of Treatment: 3 History: Pulmonary Disease (COPD) Clustered Wound: No Photos Wound Measurements Length: (cm) 0 % Reduct Width: (cm) 0 % Reduct Depth: (cm) 0 Epitheli Area: (cm) 0 Volume: (cm) 0 Wound Description Classification: Full Thickness Without Exposed Support Foul Od Structures Slough/ Wound Distinct, outline attached Margin: Exudate Medium Amount: Exudate Purulent Type: Exudate yellow, brown, green Color: Wound Bed Granulation Amount: Medium (34-66%) Granulation Quality: Pink Fascia Exp Necrotic Amount: Medium (34-66%) Fat Layer Necrotic Quality: Adherent Slough Tendon Exp Muscle Exp Joint Expo Bone Expos or After Cleansing: No Fibrino Yes Exposed Structure osed: No (Subcutaneous Tissue) Exposed: Yes osed: No osed: No sed: No ed: No ion in Area: 100% ion in Volume: 100% alization: Small  (1-33%) Electronic Signature(s) Signed: 03/23/2019 6:12:06 PM By: Baruch Gouty RN, BSN Signed: 03/26/2019 4:06:15 PM By: Mikeal Hawthorne EMT/HBOT Previous Signature: 03/21/2019 6:42:58 PM Version By: Baruch Gouty RN, BSN Entered By: Mikeal Hawthorne on 03/23/2019 09:37:15 -------------------------------------------------------------------------------- Wound Assessment Details Patient Name: Date of Service: Omar Hawking. 03/21/2019 12:30 PM Medical Record [id number] Patient Account Number: [id number] Date of Birth/Sex: Treating RN: 14-Jul-1955 (63 y.o. Omar Wagner Primary Care Gianpaolo Mindel: Jack Quarto Other Clinician: Referring Avaley Coop: Treating Hayzlee Mcsorley/Extender:Stone III, Bradd Burner, MARY Weeks in Treatment: 3 Wound Status Wound Number: 6 Primary To be determined Etiology: Wound Location: Right Toe Great Wound Open Wounding Event: Gradually Appeared Status: Date Acquired: 03/21/2019 Comorbid Lymphedema, Chronic Obstructive Weeks Of Treatment: 0 History: Pulmonary Disease (COPD) Clustered Wound: No Photos Wound Measurements Length: (cm) 0.5 % Reduction Width: (cm) 1 % Reduct Depth: (cm) 0.1 Epitheli Area: (cm) 0.393 Tunneli Volume: (cm) 0.039 Undermi Wound Description Classification: Full Thickness Without Exposed Support Foul Od Structures Slough/ Wound Distinct, outline attached Margin: Exudate Medium Amount: Exudate Serosanguineous Type: Exudate red, brown Color: Wound Bed Granulation Amount: Large (67-100%) Granulation Quality: Red, Pink Fascia Necrotic Amount: None Present (0%) Fat Lay Tendon Muscle Joint E Bone Ex or After Cleansing: No Fibrino No Exposed Structure Exposed: No er (Subcutaneous Tissue) Exposed: No Exposed: No Exposed: No xposed: No posed: No in Area: 0% ion in Volume: 0% alization: Small (1-33%) ng: No ning: No Electronic Signature(s) Signed: 03/23/2019 6:12:06 PM By: Baruch Gouty RN, BSN Signed: 03/26/2019  4:06:15 PM By: Mikeal Hawthorne EMT/HBOT Previous Signature: 03/21/2019 6:30:51 PM Version By: [name] Previous Signature: 03/21/2019 6:42:58 PM Version By: Baruch Gouty RN, BSN Entered By: Mikeal Hawthorne on 03/23/2019 09:36:30 -------------------------------------------------------------------------------- Vitals Details Patient Name: Date of Service: Omar Hawking. 03/21/2019 12:30 PM Medical Record [id number] Patient Account Number: [id number] Date of Birth/Sex: Treating RN: [birth year] (63 y.o. Omar Wagner Primary Care Juniel Groene: Jack Quarto Other Clinician: Referring Abem Shaddix: Treating Yonah Tangeman/Extender:Stone III, Bradd Burner, MARY Weeks in Treatment: 3 Vital Signs Time Taken: 13:14 Temperature (F): 98.1 Height (in): 72 Pulse (bpm): 120 Weight (lbs): 185 Respiratory  Rate (breaths/min): 17 Body Mass Index (BMI): 25.1 Blood Pressure (mmHg): 138/73 Reference Range: 80 - 120 mg / dl Electronic Signature(s) Signed: 04/24/2019 3:00:22 PM By: Sandre Kitty Entered By: Sandre Kitty on 03/21/2019 13:14:21

## 2019-04-24 NOTE — Progress Notes (Signed)
[facility] Therapy Treatment Record Phone [phone]   Radiation Therapy was administered to Omar Wagner on: 04/24/2019  3:17 PM and was treatment #7 out of a planned course of 12 treatments.  Radiation Treatment  1). Beam photons with 6-10 energy  2). Brachytherapy None  3). Stereotactic Radiosurgery None  4). Other Radiation None     Paulla Fore, RT (T)

## 2019-04-24 NOTE — Progress Notes (Signed)
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  Past Medical History  Depression, basal cell carcinoma, Heroin abuse  Significant Hospital Events   11/26 Admitted 11/27 CT guided biopsy of soft tissue nodule in the left posterior chest wall 11/28 pulmonary consulted.  Recommended routine bronchodilator therapy antibiotics and prednisone taper 11/29 Tachy/brady with HTN.  Cards consulted >> add amiodarone. VDRF, bronch >> endobronchial tumor in LUL and compression LLL 11/30 recurrent SVT 12/01 Intermittent arrhythmias, but less frequent.  Start vent weaning. 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  Was able to get some sleep last night.  Breathing okay.  Intermittent cough.  Objective   Blood pressure (!) 140/92, pulse 96, temperature 98.1 F (36.7 C), temperature source Axillary, resp. rate  15, height _0  (1.854 m), weight 93.2 kg, SpO2 95 %.    FiO2 (%):  [5 %] 5 %   Intake/Output Summary (Last 24 hours) at 04/24/2019 0758 Last data filed at 04/24/2019 2761 Gross per 24 hour  Intake 310 ml  Output 1600 ml  Net -1290 ml   Filed Weights   04/20/19 0412 04/21/19 0401 04/23/19 0500  Weight: 91.4 kg 91.7 kg 93.2 kg   Physical Exam:  General - alert Eyes - pupils reactive ENT - no sinus tenderness, no stridor Cardiac - regular rate/rhythm, no murmur Chest - decreased BS on Lt, scattered rhonchi Abdomen - soft, non tender, + bowel sounds Extremities - no cyanosis, clubbing, or edema Skin - lower leg venous stasis Neuro - normal strength, moves extremities, follows commands Psych - normal mood and behavior   Resolved Hospital Problem list   VDRF  Assessment & Plan:   Extensive SCLC with brain met. - oncology, radiation oncology following - getting carboplatin, etoposide, XRT - f/u blood counts   Cancer pain. - prn tylenol, oxycodone, fentanyl - bowel regimen while on opiates  Acute hypoxic respiratory from SCLC with airway compression, post obstructive pneumonia, and severe COPD with emphysema. - completed ABx for pneumonia - COPD exacerbation resolved - continue scheduled atrovent, xopenex, singulair - f/u CXR intermittently - goal SpO2 88 to 95%  Multifocal atrial tachycardia. - continue lopressor  Sacral deep tissue pressure injury (not present on admission) Lower leg venous stasis. - Eucerin cream to legs - silicone foam dressing to sacrum  Hypercalcemia of malignancy. - tx with zoledronic acid 03/20/2019 - plan for zolendronic acid every 4 weeks per  oncology  Vit D deficiency. - vitamin D  Dysphagia. - advanced to D3 diet - f/u with speech  Hyperglycemia. - improved - d/c SSI   Best practice:  Diet: D3 diet DVT prophylaxis: lovenox GI prophylaxis: not indicated Mobility: activity as tolerated Code Status: okay with Bipap.   Otherwise DNR/DNI Disposition: SDU status  Labs:   CMP Latest Ref Rng & Units 04/24/2019 04/23/2019 04/22/2019  Glucose 70 - 99 mg/dL 80 - -  BUN 8 - 23 mg/dL 17 - -  Creatinine 0.61 - 1.24 mg/dL 0.62 - -  Sodium 135 - 145 mmol/L 144 - -  Potassium 3.5 - 5.1 mmol/L 3.5 - -  Chloride 98 - 111 mmol/L 113(H) - -  CO2 22 - 32 mmol/L 25 - -  Calcium 8.9 - 10.3 mg/dL 7.1(L) 7.2(L) 7.1(L)  Total Protein 6.5 - 8.1 g/dL - - -  Total Bilirubin 0.3 - 1.2 mg/dL - - -  Alkaline Phos 38 - 126 U/L - - -  AST 15 - 41 U/L - - -  ALT 0 - 44 U/L - - -    CBC Latest Ref Rng & Units 04/24/2019 04/22/2019 04/21/2019  WBC 4.0 - 10.5 K/uL 20.7(H) 11.7(H) 9.0  Hemoglobin 13.0 - 17.0 g/dL 13.4 12.9(L) 13.3  Hematocrit 39.0 - 52.0 % 41.8 40.9 41.9  Platelets 150 - 400 K/uL 150 155 144(L)    ABG    Component Value Date/Time   PHART 7.269 (L) 04/15/2019 1545   PCO2ART 55.6 (H) 04/15/2019 1545   PO2ART 114 (H) 04/15/2019 1545   HCO3 24.6 04/15/2019 1545   TCO2 26.1 01/11/2013 0610   ACIDBASEDEF 2.8 (H) 04/15/2019 1545   O2SAT 98.0 04/15/2019 1545    CBG (last 3)  Recent Labs    04/23/19 2314 04/24/19 0322 04/24/19 0743  GLUCAP 98 [address] [place], MD [facility] Pulmonary/Critical Care 04/24/2019, 7:58 AM

## 2019-04-24 NOTE — Evaluation (Signed)
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 PMH: COPD, chronic lymphadema, COPD (FEV1 33%)  Clinical Impression  Pt admitted with above diagnosis.  Pt agreeable to OOB today. Continues to fatigue rapidly but overall requiring min assist for transfers. VSS--on 5L O2 and maintaining sats >92%, RR 15-22. Will likely need rollator for home, HHPT, possibly w/c, will continue to assess for d/c needs.  Pt currently with functional limitations due to the deficits listed below (see PT Problem List). Pt will benefit from skilled PT to increase their independence and safety with mobility to allow discharge to the venue listed below.       Follow Up Recommendations Home health PT;Supervision - Intermittent    Equipment Recommendations  Other (comment)(rollator)    Recommendations for Other Services       Precautions / Restrictions Precautions Precautions: Fall Precaution Comments: monitor VS/O2 dependent Restrictions Weight Bearing Restrictions: No      Mobility  Bed Mobility Overal bed mobility: Needs Assistance Bed Mobility: Supine to Sit     Supine to sit: Supervision     General bed mobility comments: for lines and safety  Transfers Overall transfer level: Needs assistance Equipment used: Rolling walker (2 wheeled) Transfers: Sit to/from [employer] Transfers Sit to Stand: Min assist;+2 safety/equipment;Min guard Stand pivot transfers: Min guard;Min assist       General transfer comment: cues for hand placement and overall safety  Ambulation/Gait                Stairs            Wheelchair Mobility    Modified Rankin (Stroke Patients Only)        Balance Overall balance assessment: Needs assistance Sitting-balance support: No upper extremity supported;Feet supported Sitting balance-Leahy Scale: Good     Standing balance support: Bilateral upper extremity supported Standing balance-Leahy Scale: Poor Standing balance comment: reliant on UE support                             Pertinent Vitals/Pain Pain Assessment: No/denies pain    Home Living Family/patient expects to be discharged to:: Private residence Living Arrangements: Non-relatives/Friends Available Help at Discharge: Friend(s);Available PRN/intermittently   Home Access: Stairs to enter   Entrance Stairs-Number of Steps: 2 Home Layout: One level Home Equipment: None      Prior Function Level of Independence: Independent with assistive device(s)         Comments: pt is not forth coming with info     Hand Dominance        Extremity/Trunk Assessment   Upper Extremity Assessment Upper Extremity Assessment: Defer to OT evaluation    Lower Extremity Assessment Lower Extremity Assessment: (grossly at least 3 to 3+/5, chronic venous stasis changes, edematous)       Communication   Communication: No difficulties  Cognition Arousal/Alertness: Awake/alert Behavior During Therapy: WFL for tasks assessed/performed Overall Cognitive Status: No family/caregiver present to determine baseline cognitive functioning Area of Impairment: Safety/judgement;Following commands                       Following Commands: Follows one step commands consistently;Follows multi-step commands with increased time Safety/Judgement: Decreased  awareness of safety     General Comments: mildly impulsive at times, not always compliant with PT/OT cues (delayed processing as well), vague historian      General Comments      Exercises     Assessment/Plan    PT Assessment Patient needs continued PT services  PT Problem List Decreased activity  tolerance;Decreased balance;Decreased mobility;Decreased knowledge of use of DME       PT Treatment Interventions Gait training;DME instruction;Stair training;Functional mobility training;Therapeutic activities;Therapeutic exercise;Patient/family education    PT Goals (Current goals can be found in the Care Plan section)  Acute Rehab PT Goals Patient Stated Goal: agreeable to OOB PT Goal Formulation: With patient Time For Goal Achievement: 05/08/19 Potential to Achieve Goals: Fair    Frequency Min 3X/week   Barriers to discharge Decreased caregiver support      Co-evaluation PT/OT/SLP Co-Evaluation/Treatment: Yes Reason for Co-Treatment: To address functional/ADL transfers PT goals addressed during session: Mobility/safety with mobility         AM-PAC PT "6 Clicks" Mobility  Outcome Measure Help needed turning from your back to your side while in a flat bed without using bedrails?: None Help needed moving from lying on your back to sitting on the side of a flat bed without using bedrails?: A Little Help needed moving to and from a bed to a chair (including a wheelchair)?: A Little Help needed standing up from a chair using your arms (e.g., wheelchair or bedside chair)?: A Little Help needed to walk in hospital room?: A Little Help needed climbing 3-5 steps with a railing? : A Lot 6 Click Score: 18    End of Session Equipment Utilized During Treatment: Gait belt;Oxygen Activity Tolerance: Patient limited by fatigue Patient left: in chair;with call bell/phone within reach;with chair alarm set Nurse Communication: Mobility status PT Visit Diagnosis: Difficulty in walking, not elsewhere classified (R26.2)    Time: 5400-8676 PT Time Calculation (min) (ACUTE ONLY): 19 min   Charges:   PT Evaluation $PT Re-evaluation: 1 Re-eval          Kenyon Ana, PT  Pager: [phone] Acute Rehab Dept [facility]): [phone]   04/24/2019   [facility] 04/24/2019, 10:44  AM

## 2019-04-24 NOTE — Progress Notes (Signed)
RN in room when patient went into SVT sustained 170s. Rn obtained EKG, called MD and got order for 5 mg IV Lopressor. Patient denied chest pain and shortness of breath. After receiving Lopressor, patient heart rate went back NSR. Will continue to monitor.

## 2019-04-25 ENCOUNTER — Inpatient Hospital Stay (HOSPITAL_COMMUNITY): Payer: Medicare Other

## 2019-04-25 ENCOUNTER — Ambulatory Visit
Admit: 2019-04-25 | Discharge: 2019-04-25 | Disposition: A | Discharge status: Home / Self Care | Payer: Medicare Other | Attending: Radiation Oncology | Admitting: Radiation Oncology

## 2019-04-25 LAB — BASIC METABOLIC PANEL
Anion gap: 3 — ABNORMAL LOW (ref 5–15)
BUN: 12 mg/dL (ref 8–23)
CO2: 28 mmol/L (ref 22–32)
Calcium: 7 mg/dL — ABNORMAL LOW (ref 8.9–10.3)
Chloride: 112 mmol/L — ABNORMAL HIGH (ref 98–111)
Creatinine, Ser: 0.56 mg/dL — ABNORMAL LOW (ref 0.61–1.24)
GFR calc Af Amer: >60 mL/min (ref >60)
GFR calc non Af Amer: >60 mL/min (ref >60)
Glucose, Bld: 88 mg/dL (ref 70–99)
Potassium: 3.4 mmol/L — ABNORMAL LOW (ref 3.5–5.1)
Sodium: 143 mmol/L (ref 135–145)

## 2019-04-25 LAB — CBC
HCT: 39.2 % (ref 39.0–52.0)
Hemoglobin: 12.3 g/dL — ABNORMAL LOW (ref 13.0–17.0)
MCH: 35.3 pg — ABNORMAL HIGH (ref 26.0–34.0)
MCHC: 31.4 g/dL (ref 30.0–36.0)
MCV: 112.6 fL — ABNORMAL HIGH (ref 80.0–100.0)
Platelets: 133 10*3/uL — ABNORMAL LOW (ref 150–400)
RBC: 3.48 MIL/uL — ABNORMAL LOW (ref 4.22–5.81)
RDW: 12.1 % (ref 11.5–15.5)
WBC: 20.2 10*3/uL — ABNORMAL HIGH (ref 4.0–10.5)
nRBC: 0 % (ref 0.0–0.2)

## 2019-04-25 LAB — GLUCOSE, CAPILLARY
Glucose-Capillary: 80 mg/dL (ref 70–99)
Glucose-Capillary: 84 mg/dL (ref 70–99)
Glucose-Capillary: 121 mg/dL — ABNORMAL HIGH (ref 70–99)
Glucose-Capillary: 91 mg/dL (ref 70–99)
Glucose-Capillary: 104 mg/dL — ABNORMAL HIGH (ref 70–99)
Glucose-Capillary: 136 mg/dL — ABNORMAL HIGH (ref 70–99)

## 2019-04-25 LAB — CALCIUM: Calcium: 7.2 mg/dL — ABNORMAL LOW (ref 8.9–10.3)

## 2019-04-25 LAB — MAGNESIUM: Magnesium: 1.4 mg/dL — ABNORMAL LOW (ref 1.7–2.4)

## 2019-04-25 LAB — PHOSPHORUS
Phosphorus: 1.7 mg/dL — ABNORMAL LOW (ref 2.5–4.6)
Phosphorus: 1.5 mg/dL — ABNORMAL LOW (ref 2.5–4.6)

## 2019-04-25 MED ORDER — ADENOSINE 6 MG/2ML IV SOLN
INTRAVENOUS | Status: AC
  Administered 2019-04-25: 12 mg
  Filled 2019-04-25: qty 2

## 2019-04-25 MED ORDER — DILTIAZEM HCL-DEXTROSE 125-5 MG/125ML-% IV SOLN (PREMIX)
5.0000 mg/h | INTRAVENOUS | Status: DC
  Filled 2019-04-25: qty 125

## 2019-04-25 MED ORDER — DILTIAZEM HCL 25 MG/5ML IV SOLN
10.0000 mg | Freq: Once | INTRAVENOUS | Status: AC
  Administered 2019-04-25: 10 mg via INTRAVENOUS
  Filled 2019-04-25: qty 5

## 2019-04-25 MED ORDER — DILTIAZEM LOAD VIA INFUSION
15.0000 mg | Freq: Once | INTRAVENOUS | Status: AC
  Administered 2019-04-25: 15 mg via INTRAVENOUS
  Filled 2019-04-25: qty 15

## 2019-04-25 MED ORDER — ADENOSINE 6 MG/2ML IV SOLN
12.0000 mg | Freq: Once | INTRAVENOUS | Status: AC
  Administered 2019-04-25: 12 mg via INTRAVENOUS

## 2019-04-25 MED ORDER — POTASSIUM CHLORIDE 10 MEQ/50ML IV SOLN
10.0000 meq | INTRAVENOUS | Status: AC
  Administered 2019-04-25 (×2): 10 meq via INTRAVENOUS
  Filled 2019-04-25 (×2): qty 50

## 2019-04-25 MED ORDER — POTASSIUM CHLORIDE 20 MEQ/15ML (10%) PO SOLN
40.0000 meq | Freq: Once | ORAL | Status: AC
  Administered 2019-04-25: 40 meq
  Filled 2019-04-25: qty 30

## 2019-04-25 MED ORDER — ADULT MULTIVITAMIN W/MINERALS CH
1.0000 | ORAL_TABLET | Freq: Every day | ORAL | Status: DC
  Administered 2019-04-25 – 2019-04-28 (×4): 1 via ORAL
  Filled 2019-04-25 (×4): qty 1

## 2019-04-25 MED ORDER — ADENOSINE 6 MG/2ML IV SOLN
6.0000 mg | Freq: Once | INTRAVENOUS | Status: AC
  Administered 2019-04-25: 6 mg via INTRAVENOUS

## 2019-04-25 MED ORDER — MAGNESIUM SULFATE 4 GM/100ML IV SOLN
4.0000 g | Freq: Once | INTRAVENOUS | Status: AC
  Administered 2019-04-25: 4 g via INTRAVENOUS
  Filled 2019-04-25: qty 100

## 2019-04-25 MED ORDER — DILTIAZEM HCL-DEXTROSE 125-5 MG/125ML-% IV SOLN (PREMIX)
5.0000 mg/h | INTRAVENOUS | Status: DC
  Administered 2019-04-25: 10 mg/h via INTRAVENOUS
  Administered 2019-04-25: 5 mg/h via INTRAVENOUS
  Filled 2019-04-25: qty 125

## 2019-04-25 MED ORDER — ADENOSINE 6 MG/2ML IV SOLN
INTRAVENOUS | Status: AC
  Filled 2019-04-25: qty 2

## 2019-04-25 MED ORDER — BOOST / RESOURCE BREEZE PO LIQD CUSTOM
1.0000 | Freq: Two times a day (BID) | ORAL | Status: DC
  Administered 2019-04-25 – 2019-04-28 (×4): 1 via ORAL

## 2019-04-25 NOTE — Progress Notes (Signed)
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  Past Medical History  Depression, basal cell carcinoma, Heroin abuse  Significant Hospital Events   11/26 Admitted 11/27 CT guided biopsy of soft tissue nodule in the left posterior chest wall 11/28 pulmonary consulted.  Recommended routine bronchodilator therapy antibiotics and prednisone taper 11/29 Tachy/brady with HTN.  Cards consulted >> add amiodarone. VDRF, bronch >> endobronchial tumor in LUL and compression LLL 11/30 recurrent SVT 12/01 Intermittent arrhythmias, but less frequent.  Start vent weaning. 12/03 extubated 12/05 d/c amiodarone 12/9 run of SVT during the night.  Treated with calcium channel blocker.  4-day #8 of 12 radiation therapy.  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 rate 19, height 6' 1" (1.854 m), weight 92.5 kg, SpO2 94 %.        Intake/Output Summary (Last 24 hours) at 04/25/2019 1694 Last data filed at 04/25/2019 0700 Gross per 24 hour  Intake 40.58 ml  Output 1895 ml  Net -1854.42 ml   Filed Weights   04/21/19 0401 04/23/19 0500 04/25/19 0427  Weight: 91.7 kg 93.2 kg 92.5 kg   Physical Exam:  General this is a pleasant 63 year old white male he is lying in bed and currently comfortable in no acute distress HEENT normocephalic atraumatic no jugular venous distention is appreciated Pulmonary: Expiratory wheezes, no accessory use.  Currently on nasal cannula, but remains on 6 L.  Interestingly he is tolerating lying on his left side quite comfortably Cardiac: Regular irregular, he is currently atrial fibrillation with heart rate in the low 100s Abdomen not tender no organomegaly positive bowel sounds tolerating diet Extremities are warm and dry chronic lymphedema changes Neuro awake oriented no focal deficits appreciated   Resolved Hospital Problem list   VDRF Hyperglycemia COPD exacerbation Postobstructive pneumonia  Assessment & Plan:   Extensive SCLC with brain met. - oncology, radiation oncology following, he has completed day 3 of cycle 3 and his chemotherapy with carboplatin and etoposide Plan Close observation of CBC/blood counts He is for #8 of 12 plan XRT treatments today   Cancer pain. Plan  Continue as needed Tylenol, oxycodone, or fentanyl  Bowel regimen ordered   Acute hypoxic respiratory from [place] with airway compression, post obstructive pneumonia, and severe COPD  with emphysema. Clinically he looks better He is actually able to lay in the left side-lying position which would suggest improved aeration of the left hemithorax Plan Continue with scheduled bronchodilators Incentive  spirometry/flutter valve Mobilization Pulse oximetry with oxygen Chest x-ray today  Multifocal atrial tachycardia. -Had supraventricular tachycardia again last night. Plan Continue telemetry monitoring Continue current dosing of Lopressor at 12.5 mg twice daily As he seemed to improve with calcium channel blocker bolus, we will add scheduled diltiazem orally  Fluid and electrolyte imbalance: Hypomagnesemia, Hypokalemia, hyperchloremia Plan Replace magnesium and potassium  Leukocytosis Plan Continue to watch closely, he did get his first dose of Granix on 12/7  Sacral deep tissue pressure injury (not present on admission) Lower leg venous stasis. Plan Mobilization Maximize nutritional status Eucerin cream to legs and silicone foam dressing to sacrum   Hypercalcemia of malignancy. - tx with zoledronic acid 04/13/2019 Plan We will get zoledronic acid every 4 weeks as directed by oncology   Vit D deficiency. Plan Continue vitamin D  Dysphagia. Plan Continue dysphagia 3 diet   Best practice:  Diet: D3 diet DVT prophylaxis: lovenox GI prophylaxis: not indicated Mobility: activity as tolerated Code Status: okay with Bipap.  Otherwise DNR/DNI Disposition: SDU status    Erick Colace ACNP-BC [place] Pager # [phone] OR # [phone] if no answer

## 2019-04-25 NOTE — Progress Notes (Signed)
WTA RN requested by patients RN to assess patients pressure ulcer.  Patient has an 11 inch by 4 inch redness that is blanchable to the gluteal crevice which appears to be MASD not a pressure ulcer.  Recommendation for Dr. Docia Barrier Cream given to the bedside RN.

## 2019-04-25 NOTE — Progress Notes (Signed)
eLink Physician-Brief Progress Note Patient Name: Omar Wagner DOB: [birth year] MRN: [id number]   Date of Service  04/25/2019  HPI/Events of Note  Adenosine x 3 iv push failed to break the rhytm  eICU Interventions  Cardizem 10 mg iv then Cardizem infusion at 5 mg/ hour ordered        [employer] 04/25/2019, 6:45 AM

## 2019-04-25 NOTE — Progress Notes (Signed)
Nutrition Follow-up  DOCUMENTATION CODES:   Not applicable  INTERVENTION:  - will order Boost Breeze BID, each supplement provides 250 kcal and 9 grams of protein. - will order daily multivitamin with minerals. - continue to encourage PO intakes.    NUTRITION DIAGNOSIS:   Increased nutrient needs related to cancer and cancer related treatments, chronic illness, acute illness as evidenced by estimated needs. -revised, ongoing  GOAL:   Patient will meet greater than or equal to 90% of their needs -likely unmet at this time.   MONITOR:   PO intake, Supplement acceptance, Labs, Weight trends, Skin  ASSESSMENT:   63 y.o. male with medical history significant of COPD, lung mass, bilateral lymphedema, areas of ulceration to BLE d/t chronic venous insufficiency, squamous cell carcinoma and basal cell carcinoma. He presented to the ED with SOB and cough productive of sputum. Prednisone and inhalers were used PTA but did not provide relief. He has a known lung mass which was seen on CT.  Diet advanced from FLD, nectar-thick liquids to Dysphagia 3, thin liquids on 12/7 at 1021. No intakes documented since advancement. Patient sleeping at this time with no family/visitors at bedside. Able to talk with RN who reports that patient had eggs (75-80% completion) and Sprite for breakfast; nothing else on tray.   Magic Cup was previously ordered but patient did not like that supplement. Will trial Boost Breeze. Weight has been stable x6 days.  Per notes: - extensive SCLC with brain mets--on chemo (carboplatin and etoposide), today is #8/12 of planned radiation treatments - cancer-induced pain--PRN meds ordered - suspected improvement in L hemithorax - hypomagnesemia and mild hypokalemia--repletion ordered - leukocytosis - sacral DTI was not POA - vitamin D deficiency--daily supplementation ordered - dysphagia    Labs reviewed; CBGs: 80 and 84 mg/dl, K: 3.4 mmol/l, Cl: 112 mmol/l, creatinine:  0.56 mg/dl, Ca: 7 mg/dl, Phos: 1.7 mg/dl, Mg: 1.4 mg/dl.  Medications reviewed; 1000 units cholecalciferol/day, 4 g IV Mg sulfate x1 run 12/9, 1 packet miralax/day, 10 mEq KCl x2 run 12/9, 40 mEq KCl x1 dose 12/9.   Diet Order:   Diet Order            DIET DYS 3 Room service appropriate? Yes with Assist; Fluid consistency: Thin  Diet effective now              EDUCATION NEEDS:   No education needs have been identified at this time  Skin:  Skin Assessment: Skin Integrity Issues: Skin Integrity Issues:: DTI DTI: sacrum (new documentation 12/4)  Last BM:  12/8  Height:   Ht Readings from Last 1 Encounters:  03/28/2019 6\' 1"  (1.854 m)    Weight:   Wt Readings from Last 1 Encounters:  04/25/19 92.5 kg    Ideal Body Weight:  83.6 kg  BMI:  Body mass index is 26.9 kg/m.  Estimated Nutritional Needs:   Kcal:  2250-2450 kcal  Protein:  110-120 grams  Fluid:  >/= 2.1 L/day     Jarome Matin, MS, RD, LDN, [facility] Inpatient Clinical Dietitian Pager # [phone] After hours/weekend pager # [phone]

## 2019-04-25 NOTE — Progress Notes (Addendum)
eLink Physician-Brief Progress Note Patient Name: Omar Wagner DOB: August 28, 1955 MRN: [id number]   Date of Service  04/25/2019  HPI/Events of Note  SVT with rate of 170  eICU Interventions  Adenosine 6 mg iv x 1 followed by 12 mg iv x 1 for persistent SVT, KCL 20 meq over 2 hours for K+ of 3.4, MG level ordered.        Kerry Kass Briton Sellman 04/25/2019, 6:25 AM

## 2019-04-26 ENCOUNTER — Ambulatory Visit: Admit: 2019-04-26 | Payer: Medicare Other

## 2019-04-26 DIAGNOSIS — C3490 Malignant neoplasm of unspecified part of unspecified bronchus or lung: Secondary | ICD-10-CM | POA: Diagnosis not present

## 2019-04-26 DIAGNOSIS — R600 Localized edema: Secondary | ICD-10-CM

## 2019-04-26 DIAGNOSIS — C7931 Secondary malignant neoplasm of brain: Secondary | ICD-10-CM | POA: Diagnosis not present

## 2019-04-26 DIAGNOSIS — Z515 Encounter for palliative care: Secondary | ICD-10-CM

## 2019-04-26 LAB — BASIC METABOLIC PANEL
Anion gap: 5 (ref 5–15)
BUN: 10 mg/dL (ref 8–23)
CO2: 25 mmol/L (ref 22–32)
Calcium: 7.1 mg/dL — ABNORMAL LOW (ref 8.9–10.3)
Chloride: 111 mmol/L (ref 98–111)
Creatinine, Ser: 0.62 mg/dL (ref 0.61–1.24)
GFR calc Af Amer: >60 mL/min (ref >60)
GFR calc non Af Amer: >60 mL/min (ref >60)
Glucose, Bld: 195 mg/dL — ABNORMAL HIGH (ref 70–99)
Potassium: 3.5 mmol/L (ref 3.5–5.1)
Sodium: 141 mmol/L (ref 135–145)

## 2019-04-26 LAB — CBC
HCT: 37.9 % — ABNORMAL LOW (ref 39.0–52.0)
Hemoglobin: 11.7 g/dL — ABNORMAL LOW (ref 13.0–17.0)
MCH: 35.8 pg — ABNORMAL HIGH (ref 26.0–34.0)
MCHC: 30.9 g/dL (ref 30.0–36.0)
MCV: 115.9 fL — ABNORMAL HIGH (ref 80.0–100.0)
Platelets: 106 10*3/uL — ABNORMAL LOW (ref 150–400)
RBC: 3.27 MIL/uL — ABNORMAL LOW (ref 4.22–5.81)
RDW: 12.1 % (ref 11.5–15.5)
WBC: 10.9 10*3/uL — ABNORMAL HIGH (ref 4.0–10.5)
nRBC: 0 % (ref 0.0–0.2)

## 2019-04-26 LAB — CALCIUM: Calcium: 7.2 mg/dL — ABNORMAL LOW (ref 8.9–10.3)

## 2019-04-26 LAB — PHOSPHORUS
Phosphorus: 1.9 mg/dL — ABNORMAL LOW (ref 2.5–4.6)
Phosphorus: 3.3 mg/dL (ref 2.5–4.6)

## 2019-04-26 LAB — GLUCOSE, CAPILLARY
Glucose-Capillary: 141 mg/dL — ABNORMAL HIGH (ref 70–99)
Glucose-Capillary: 113 mg/dL — ABNORMAL HIGH (ref 70–99)
Glucose-Capillary: 163 mg/dL — ABNORMAL HIGH (ref 70–99)

## 2019-04-26 LAB — MAGNESIUM: Magnesium: 1.7 mg/dL (ref 1.7–2.4)

## 2019-04-26 MED ORDER — POTASSIUM PHOSPHATES 15 MMOLE/5ML IV SOLN
30.0000 mmol | Freq: Once | INTRAVENOUS | Status: AC
  Administered 2019-04-26: 30 mmol via INTRAVENOUS
  Filled 2019-04-26: qty 10

## 2019-04-26 MED ORDER — DILTIAZEM HCL 60 MG PO TABS
60.0000 mg | ORAL_TABLET | Freq: Four times a day (QID) | ORAL | Status: DC
  Administered 2019-04-26 – 2019-04-29 (×10): 60 mg via ORAL
  Filled 2019-04-26 (×11): qty 1

## 2019-04-26 NOTE — Progress Notes (Addendum)
[facility]   HEMATOLOGY/ONCOLOGY INPATIENT PROGRESS NOTE  Date of Service: 04/26/2019  Inpatient Attending: .Chesley Mires, MD  SUBJECTIVE  Patient seen in the medical ICU for oncology follow-up.  Continues to report some left-sided chest discomfort without acute changes.  Oxygenation about the same.  Severe COPD at baseline.  Reports breathing is up and down.  He has been able to ambulate some with staff and reports some increased dyspnea on exertion.  Notes improving p.o. intake.  No nausea or vomiting this morning.  Denies mucositis.  No fevers no chills.  Notes more swelling in his arms and legs.  OBJECTIVE:  No acute distress  PHYSICAL EXAMINATION: . Vitals:   04/26/19 0640 04/26/19 0800 04/26/19 0843 04/26/19 0902  BP:      Pulse:   (!) 101   Resp:   16   Temp:  (!) 97.5 F (36.4 C)    TempSrc:  Oral    SpO2: 93%  97% 96%  Weight:      Height:       Filed Weights   04/23/19 0500 04/25/19 0427 04/26/19 0429  Weight: 205 lb 7.5 oz (93.2 kg) 203 lb 14.8 oz (92.5 kg) 200 lb 9.9 oz (91 kg)   .Body mass index is 26.47 kg/m.   Intake/Output Summary (Last 24 hours) at 04/26/2019 1046 Last data filed at 04/26/2019 0400 Gross per 24 hour  Intake 230.15 ml  Output 875 ml  Net -644.85 ml    . GENERAL:alert, in no acute distress, on nasal cannula oxygen SKIN: no acute rashes EYES: conjunctiva are pink and non-injected, sclera anicteric OROPHARYNX: MMM NECK: supple, no JVD LYMPH:  no palpable lymphadenopathy in the cervical, axillary or inguinal regions LUNGS: Decreased air entry bilaterally much more on the left side HEART: regular rate & rhythm ABDOMEN:  normoactive bowel sounds , non tender, not distended. Extremity: 1+ pitting upper extremity and pedal edema with changes of chronic venous stasis/lymphedema PSYCH: alert & oriented x 3 with fluent speech NEURO: no focal motor/sensory deficits  MEDICAL HISTORY:   Past Medical History:  Diagnosis Date   Basal cell  carcinoma    Brain metastasis (HCC)    Chronic venous insufficiency    COPD (chronic obstructive pulmonary disease) (HCC)    Depression    History of heroin use    Lung mass    Lymphedema    Paroxysmal SVT (supraventricular tachycardia) ([place])     SURGICAL HISTORY: Past Surgical History:  Procedure Laterality Date   HERNIA REPAIR      SOCIAL HISTORY: Social History   Socioeconomic History   Marital status: Widowed    Spouse name: Not on file   Number of children: Not on file   Years of education: Not on file   Highest education level: Not on file  Occupational History   Not on file  Tobacco Use   Smoking status: Current Some Day Smoker    Packs/day: 1.00    Years: 30.00    Pack years: 30.00    Types: Cigarettes   Smokeless tobacco: Never Used  Substance and Sexual Activity   Alcohol use: Yes    Comment: social   Drug use: Yes    Types: Heroin, IV    Comment: heroin  last year   Sexual activity: Never    Comment: opiate abuse  Other Topics Concern   Not on file  Social History Narrative   Not on file   Social Determinants of Health   Financial Resource Strain:  Difficulty of Paying Living Expenses: Not on file  Food Insecurity:    Worried About [place] in the Last Year: Not on file   Ran Out of Food in the Last Year: Not on file  Transportation Needs:    Lack of Transportation (Medical): Not on file   Lack of Transportation (Non-Medical): Not on file  Physical Activity:    Days of Exercise per Week: Not on file   Minutes of Exercise per Session: Not on file  Stress:    Feeling of Stress : Not on file  Social Connections:    Frequency of Communication with Friends and Family: Not on file   Frequency of Social Gatherings with Friends and Family: Not on file   Attends Religious Services: Not on file   Active Member of Clubs or Organizations: Not on file   Attends Archivist Meetings: Not on file     Marital Status: Not on file  Intimate Partner Violence:    Fear of Current or Ex-Partner: Not on file   Emotionally Abused: Not on file   Physically Abused: Not on file   Sexually Abused: Not on file    FAMILY HISTORY: Family History  Problem Relation Age of Onset   Cancer Other    Breast cancer Mother    Colon cancer Father    Breast cancer Sister    Liver cancer Brother     ALLERGIES:  has No Known Allergies.  MEDICATIONS:  Scheduled Meds:  chlorhexidine gluconate (MEDLINE KIT)  15 mL Mouth Rinse BID   Chlorhexidine Gluconate Cloth  6 each Topical Daily   cholecalciferol  1,000 Units Oral Daily   enoxaparin (LOVENOX) injection  40 mg Subcutaneous Q24H   feeding supplement  1 Container Oral BID BM   ipratropium  0.5 mg Nebulization Q6H   levalbuterol  0.63 mg Nebulization Q6H   mouth rinse  15 mL Mouth Rinse BID   metoprolol tartrate  12.5 mg Oral BID   montelukast  10 mg Oral QHS   multivitamin with minerals  1 tablet Oral Daily   polyethylene glycol  17 g Oral Daily   Resource ThickenUp Clear   Oral TID WC & HS   sodium chloride flush  10-40 mL Intracatheter Q12H   Tbo-filgastrim (GRANIX) SQ  480 mcg Subcutaneous q1800   Continuous Infusions:  diltiazem (CARDIZEM) infusion 10 mg/hr (04/26/19 0400)   PRN Meds:.acetaminophen **OR** acetaminophen, fentaNYL (SUBLIMAZE) injection, levalbuterol, ondansetron (ZOFRAN) IV, ondansetron, oxyCODONE, prochlorperazine, prochlorperazine, sodium chloride flush  REVIEW OF SYSTEMS:    10 Point review of Systems was done is negative except as noted above.   LABORATORY DATA:  I have reviewed the data as listed  . CBC Latest Ref Rng & Units 04/26/2019 04/25/2019 04/24/2019  WBC 4.0 - 10.5 K/uL 10.9(H) 20.2(H) 20.7(H)  Hemoglobin 13.0 - 17.0 g/dL 11.7(L) 12.3(L) 13.4  Hematocrit 39.0 - 52.0 % 37.9(L) 39.2 41.8  Platelets 150 - 400 K/uL 106(L) 133(L) 150    . CMP Latest Ref Rng & Units 04/26/2019  04/25/2019 04/25/2019  Glucose 70 - 99 mg/dL 195(H) - 88  BUN 8 - 23 mg/dL 10 - 12  Creatinine 0.61 - 1.24 mg/dL 0.62 - 0.56(L)  Sodium 135 - 145 mmol/L 141 - 143  Potassium 3.5 - 5.1 mmol/L 3.5 - 3.4(L)  Chloride 98 - 111 mmol/L 111 - 112(H)  CO2 22 - 32 mmol/L 25 - 28  Calcium 8.9 - 10.3 mg/dL 7.1(L) 7.2(L) 7.0(L)  Total Protein  6.5 - 8.1 g/dL - - -  Total Bilirubin 0.3 - 1.2 mg/dL - - -  Alkaline Phos 38 - 126 U/L - - -  AST 15 - 41 U/L - - -  ALT 0 - 44 U/L - - -   Component     Latest Ref Rng & Units 04/21/2019  Phosphorus     2.5 - 4.6 mg/dL 3.3  Lactic Acid, Venous     0.5 - 1.9 mmol/L 1.5    RADIOGRAPHIC STUDIES: I have personally reviewed the radiological images as listed and agreed with the findings in the report. DG Abd 1 View  Result Date: 04/15/2019 CLINICAL DATA:  Status post OG tube placement. EXAM: ABDOMEN - 1 VIEW COMPARISON:  None. FINDINGS: OG tube is in place with the tip in the distal stomach. IMPRESSION: As above. Electronically Signed   By: Inge Rise M.D.   On: 04/15/2019 15:51   CT Chest W Contrast  Result Date: 04/10/2019 CLINICAL DATA:  Shortness of breath and chest pain. History of basal cell carcinoma. EXAM: CT CHEST WITH CONTRAST TECHNIQUE: Multidetector CT imaging of the chest was performed during intravenous contrast administration. CONTRAST:  11m OMNIPAQUE IOHEXOL 300 MG/ML  SOLN COMPARISON:  01/08/2019.  PET-CT dated 03/01/2019 FINDINGS: Cardiovascular: The heart size is stable from prior study. The main pulmonary artery is dilated measuring approximately 3.6 cm in diameter. There is no aortic aneurysm. No thoracic aortic dissection. Mediastinum/Nodes: --there are new pathologically enlarged mediastinal lymph nodes measuring up to approximately 2.3 cm in the short axis. There are pathologically enlarged left hilar lymph nodes. --No axillary lymphadenopathy. --No supraclavicular lymphadenopathy. --Normal thyroid gland. --The esophagus is  unremarkable Lungs/Pleura: There is new near complete collapse of the left upper lobe. There is consolidation at the left lung apex which may represent postobstructive pneumonia. The left upper lobe bronchus appears to be occluded. There is significant narrowing of the left lower lobe bronchus. There is some consolidation involving the left lower lobe with a small left-sided pleural effusion. The previously demonstrated left upper lobe lung mass appears to have substantially increased in size but is difficult to distinguish from the collapsed lung. An estimate is that the mass mass measures at least 9.2 x 7.6 cm. There are mild emphysematous changes in the right lung field. There is a growing spiculated 1 cm pulmonary nodule in the right upper lobe (axial series 5, image 54). There is a significant amount of debris within the trachea and right mainstem bronchus. There is a new pulmonary opacity in the anterior right upper lobe measuring approximately 1.7 cm (axial series 5, image 62). There is some atelectasis versus consolidation involving the right lower lobe. Upper Abdomen: There are new hypoattenuating masses in the liver measuring 1.9 cm in hepatic segment 4A and 1.7 cm in hepatic segment 4 B. Musculoskeletal: There is new destruction of the posterior fifth and sixth ribs on the left consistent with osseous involvement of the known lung mass. IMPRESSION: 1. New near complete collapse of the left upper lobe felt to be secondary to progression of the patient's known left upper lobe lung mass, compressing the left upper lobe bronchus. 2. Findings consistent with significant interval progression of disease as evidenced by new pathologically enlarged mediastinal lymph nodes as well as multiple new low-attenuation masses in the liver concerning for hepatic metastatic disease. In addition, there are new pulmonary opacities in the right upper and right lower lobes raising concern for additional foci of metastatic  disease. 3. Consolidation  involving the left lung apex concerning for postobstructive pneumonia. 4. Moderate amount of debris within the trachea and bronchi bilaterally. 5. Significant narrowing of the left lower lobe bronchus. There is consolidation involving the left lower lobe with a small left-sided pleural effusion. 6. New osseous involvement involving the posterior fifth and sixth ribs on the left. Electronically Signed   By: Constance Holster M.D.   On: 04/01/2019 17:44   MR BRAIN W WO CONTRAST  Result Date: 04/17/2019 CLINICAL DATA:  Lung cancer, staging EXAM: MRI HEAD WITHOUT AND WITH CONTRAST TECHNIQUE: Multiplanar, multiecho pulse sequences of the brain and surrounding structures were obtained without and with intravenous contrast. CONTRAST:  24m GADAVIST GADOBUTROL 1 MMOL/ML IV SOLN COMPARISON:  None. FINDINGS: Brain: There is a 1 cm peripherally enhancing lesion in the inferior left cerebellum (series 11, image 13). There is a 3 mm left cerebellar lesion on image 15. Questionable additional ill-defined enhancing foci in the left cerebellum, which may be artifactual. Suspected 3 mm enhancing lesion adjacent to the right frontal horn (image 32). No significant associated edema. There is no acute infarction or intracranial hemorrhage. There is no hydrocephalus or extra-axial fluid collection. Prominence of the ventricles and sulci reflects mild generalized parenchymal volume loss. Patchy and confluent T2 hyperintensity in the supratentorial and pontine white matter is nonspecific but probably reflects moderate chronic microvascular ischemic changes. Vascular: Major vessel flow voids at the skull base are preserved. Skull and upper cervical spine: Normal marrow signal is preserved. Sinuses/Orbits: Minor mucosal thickening. Bilateral lens replacements. Other: Sella is unremarkable.  Right mastoid effusion. 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 Suspected small lesion adjacent  to the right frontal horn. Additional chronic/nonemergent findings detailed above. Electronically Signed   By: PMacy MisM.D.   On: 04/17/2019 12:27   CT BIOPSY  Result Date: 04/13/2019 INDICATION: Concern for metastatic lung cancer. Please perform CT-guided biopsy of enlarging hypermetabolic soft tissue between the posterior aspect of left sixth and seventh ribs for tissue diagnostic purposes. EXAM: CT-GUIDED BIOPSY OF HYPERMETABOLIC SOFT TISSUE BETWEEN THE POSTERIOR ASPECTS OF THE LEFT SIXTH AND SEVENTH RIBS. COMPARISON:  Chest CT-03/29/2019; 01/08/2019; PET-CT-03/01/2019 MEDICATIONS: None. ANESTHESIA/SEDATION: Fentanyl 100 mcg IV; Versed 2 mg IV Sedation time: 14 minutes; The patient was continuously monitored during the procedure by the interventional radiology nurse under my direct supervision. CONTRAST:  None. COMPLICATIONS: None immediate. PROCEDURE: Informed consent was obtained from the patient following an explanation of the procedure, risks, benefits and alternatives. A time out was performed prior to the initiation of the procedure. The patient was positioned prone on the CT table and a limited CT was performed for procedural planning demonstrating unchanged size and appearance of the ill-defined infiltrative soft tissue involving the left posterior chest wall between the left sixth and seventh ribs with dominant soft tissue nodule measuring approximately 2.3 x 1.6 cm (image 17, series 2), previously found to be hypermetabolic on preceding PET-CT. The procedure was planned. The operative site was prepped and draped in the usual sterile fashion. Appropriate trajectory was confirmed with a 22 gauge spinal needle after the adjacent tissues were anesthetized with 1% Lidocaine with epinephrine. Under intermittent CT guidance, a 17 gauge coaxial needle was advanced into the peripheral aspect of the mass. Appropriate positioning was confirmed and 5 core needle biopsy samples were obtained with an 18  gauge core needle biopsy device. The co-axial needle was removed and hemostasis was achieved with manual compression. A limited postprocedural CT was negative for pneumothorax, hemorrhage  or additional complication. A dressing was placed. The patient tolerated the procedure well without immediate postprocedural complication. IMPRESSION: Technically successful CT guided core needle biopsy of infiltrative hypermetabolic soft tissue nodule between the posterior aspects of the left sixth and seventh ribs. Electronically Signed   By: Sandi Mariscal M.D.   On: 04/13/2019 13:53   DG Chest Port 1 View  Result Date: 04/25/2019 CLINICAL DATA:  Severe COPD with acute hypoxemic respiratory failure due to postobstructive pneumonia. History of left lung cancer. EXAM: PORTABLE CHEST 1 VIEW COMPARISON:  04/22/2019 FINDINGS: Right-sided PICC line unchanged with tip over the SVC. Persistent complete opacification of the left thorax with stable mild cardiomediastinal shift to the left. Mild mixed interstitial airspace density over the right base which may be due to atelectasis, vascular congestion or infection. Remainder the exam is unchanged. IMPRESSION: 1. Stable complete opacification of the left thorax with mild cardiomediastinal shift to the left. 2. Mild mixed interstitial airspace density over the medial right base which may be due to atelectasis, vascular congestion or infection. 3.  Right-sided PICC line unchanged. Electronically Signed   By: Marin Olp M.D.   On: 04/25/2019 09:14   DG Chest Port 1 View  Result Date: 04/22/2019 CLINICAL DATA:  Left lung mass and left lung collapse. EXAM: PORTABLE CHEST 1 VIEW COMPARISON:  04/20/2019 FINDINGS: Persistent complete opacification of the left hemithorax is again seen right lung remains clear right arm PICC line remains in appropriate position. IMPRESSION: No significant change in complete opacification of left hemithorax. Electronically Signed   By: Marlaine Hind M.D.   On:  04/22/2019 06:30   DG Chest Port 1 View  Result Date: 04/20/2019 CLINICAL DATA:  Respiratory failure.  Lung cancer. EXAM: PORTABLE CHEST 1 VIEW COMPARISON:  Chest x-ray 04/19/2019 FINDINGS: The endotracheal tube and NG tubes have been removed. The right PICC line is stable. Stable complete opacification of the left hemithorax. The right lung is clear. IMPRESSION: 1. Removal of ET and NG tubes.  The right PICC line is stable. 2. Persistent complete opacification of the left hemithorax. Electronically Signed   By: Marijo Sanes M.D.   On: 04/20/2019 07:34   DG Chest Port 1 View  Result Date: 04/19/2019 CLINICAL DATA:  11/29: Episodes of hypertension As well as bradycardia. Alternating with paroxysmal atrial fibrillation with rapid ventricular response/SVT. Increasing oxygen requirements noted cardiology consulted. Started amiodarone, felt not a candidate for pacing. Developed worsening respiratory failure requiring intubation. Underwent bronchoscopy at bedside showing extensive endobronchial tumor burden in the left upper lobe with extrinsic compression on the left lower lobe.-Having recurrent bradycardia so amiodarone held later in the evening, 11/30: Went back into SVT early a.m. hours. Heart rate 198 205. Amiodarone drip resumed. Also receiving as needed Lopressor. Marked for first XRT12/1 still vent dependent. Having freq atrial arrhythmias but more stable EXAM: PORTABLE CHEST 1 VIEW COMPARISON:  04/18/2019 and earlier exams. FINDINGS: Left hemithorax remains near completely opacified. Mild opacity at the medial right lung base consistent with atelectasis. Right lung otherwise clear. Endotracheal tube, nasal/orogastric tube and right-sided PICC are stable. No pneumothorax. IMPRESSION: 1. No change from previous day's study. 2. Stable support apparatus. Electronically Signed   By: Lajean Manes M.D.   On: 04/19/2019 09:18   DG Chest Port 1 View  Result Date: 04/18/2019 CLINICAL DATA:  Follow-up exam.   Atelectasis.  Intubated patient. EXAM: PORTABLE CHEST 1 VIEW COMPARISON:  04/17/2019 and earlier exams. FINDINGS: Near complete opacification of the left hemithorax is unchanged  from the previous day's exam. Mild opacity at the medial right lung base is noted consistent with atelectasis. Right lung otherwise clear. Endotracheal tube, right PICC and nasal/orogastric tube are stable and well positioned. No pneumothorax. IMPRESSION: 1. No change from the previous day's exam. 2. Persistent near complete opacification of the left hemithorax due to the patient's known mass and postobstructive changes. 3. Stable well-positioned support apparatus. Electronically Signed   By: Lajean Manes M.D.   On: 04/18/2019 09:09   DG Chest Port 1 View  Result Date: 04/17/2019 CLINICAL DATA:  Respiratory failure. EXAM: PORTABLE CHEST 1 VIEW COMPARISON:  One-view chest x-ray 04/15/2019. CT chest 03/29/2019 FINDINGS: The heart is obscured. Endotracheal tube is stable. Right-sided PICC line is stable. NG tube courses off the inferior border of the film. Near complete opacification of left hemithorax is stable. The right lung is clear. IMPRESSION: 1. Stable appearance of the chest with near complete opacification of the left hemithorax reflecting known left lung mass and post obstructive disease. 2. The support apparatus is stable. Electronically Signed   By: San Morelle M.D.   On: 04/17/2019 07:09   DG CHEST PORT 1 VIEW  Result Date: 04/15/2019 CLINICAL DATA:  Status post ET tube placement. EXAM: PORTABLE CHEST 1 VIEW COMPARISON:  Chest radiograph 04/15/2019 FINDINGS: Monitoring leads overlie the patient. ET tube mid trachea. Enteric tube courses inferior to the diaphragm. Persistent opacification of the left hemithorax. Minimal right basilar atelectasis. Thoracic spine degenerative changes. IMPRESSION: ET tube mid trachea. Persistent opacification of the left hemithorax. Electronically Signed   By: Lovey Newcomer M.D.   On:  04/15/2019 15:30   DG CHEST PORT 1 VIEW  Result Date: 04/15/2019 CLINICAL DATA:  Dyspnea EXAM: PORTABLE CHEST 1 VIEW COMPARISON:  Chest x-rays dated 04/14/2019 04/01/2019. FINDINGS: Continued complete opacification of the LEFT hemithorax. RIGHT lung remains clear. IMPRESSION: 1. Continued complete opacification of the LEFT hemithorax, compatible with previous description of a known malignancy and associated postobstructive atelectasis. 2. RIGHT lung remains clear. Electronically Signed   By: Franki Cabot M.D.   On: 04/15/2019 08:52   DG CHEST PORT 1 VIEW  Result Date: 04/14/2019 CLINICAL DATA:  Shortness of breath EXAM: PORTABLE CHEST 1 VIEW COMPARISON:  Chest radiograph 03/28/2019 FINDINGS: Monitoring leads overlie the patient. Interval worsening complete opacification left hemithorax. Minimal right basilar atelectasis. Thoracic spine degenerative changes. IMPRESSION: Interval worsening of opacification of the left hemithorax most compatible with known malignancy and associated postobstructive atelectasis. Electronically Signed   By: Lovey Newcomer M.D.   On: 04/14/2019 17:39   DG Chest Port 1 View  Result Date: 03/18/2019 CLINICAL DATA:  Patient reports to the ED with complaint of SOB and chest pain. Patient has a hx of lymphoma and reports he has been receiving care. [name] legs have an odor and his skin is dry and flaky EXAM: PORTABLE CHEST 1 VIEW COMPARISON:  10/27/2018.  PET-CT, 03/01/2019. FINDINGS: Near complete opacification of the left hemithorax. This has developed since prior chest radiograph, and since the more recent prior PET-CT. The opacification is consistent combination of the posterior left upper lobe hypermetabolic mass noted on the prior PET-CT in combination atelectasis or postobstructive consolidation. Pleural fluid is also suspected. Right lung is hyperexpanded, but otherwise clear. No right pleural effusion. No pneumothorax. IMPRESSION: 1. Significant interval worsening left  lung aeration. Left hemithorax is now mostly opacified consistent with a combination of the presumed left upper lobe malignancy with postobstructive change/atelectasis and probable pleural fluid. Electronically Signed  By: Lajean Manes M.D.   On: 04/01/2019 14:25   ECHOCARDIOGRAM COMPLETE  Result Date: 04/15/2019   ECHOCARDIOGRAM REPORT   Patient Name:   GEARALD STONEBRAKER Date of Exam: 04/15/2019 Medical Rec #:  [id number]     Height:       73.0 in Accession #:    [id number]    Weight:       177.7 lb Date of Birth:  June 26, 1955     BSA:          2.05 m Patient Age:    35 years      BP:           147/98 mmHg Patient Gender: M             HR:           110 bpm. Exam Location:  Inpatient Procedure: 2D Echo, Color Doppler and Cardiac Doppler Indications:    I47.2 Ventricular tachycardia  History:        Patient has prior history of Echocardiogram examinations, most                 recent 08/12/2014.  Sonographer:    Merrie Roof RDCS Referring Phys: [id number] [facility]  Sonographer Comments: Echo performed with patient supine and on artificial respirator. IMPRESSIONS  1. Left ventricular ejection fraction, by visual estimation, is 60 to 65%. The left ventricle has normal function. Left ventricular septal wall thickness was mildly increased. Mildly increased left ventricular posterior wall thickness. There is mildly increased left ventricular hypertrophy.  2. Patient was extremely tachycardic ranging from 100 to 200 bpm. Left ventricular function appears to be normal.  3. Global right ventricle has normal systolic function.The right ventricular size is normal. No increase in right ventricular wall thickness.  4. Left atrial size was mildly dilated.  5. Right atrial size was severely dilated.  6. The mitral valve is normal in structure. No evidence of mitral valve regurgitation. No evidence of mitral stenosis.  7. The tricuspid valve is normal in structure. Tricuspid valve regurgitation is not demonstrated.  8. The  aortic valve is normal in structure. Aortic valve regurgitation is not visualized. No evidence of aortic valve sclerosis or stenosis.  9. The pulmonic valve was normal in structure. Pulmonic valve regurgitation is not visualized. 10. TR signal is inadequate for assessing pulmonary artery systolic pressure. 11. The inferior vena cava is dilated in size with <50% respiratory variability, suggesting right atrial pressure of 15 mmHg. FINDINGS  Left Ventricle: Left ventricular ejection fraction, by visual estimation, is 60 to 65%. The left ventricle has normal function. Mildly increased left ventricular posterior wall thickness. There is mildly increased left ventricular hypertrophy. Normal left atrial pressure. Patient was extremely tachycardic ranging from 100 to 200 bpm. Left ventricular function appears to be normal. Right Ventricle: The right ventricular size is normal. No increase in right ventricular wall thickness. Global RV systolic function is has normal systolic function. Left Atrium: Left atrial size was mildly dilated. Right Atrium: Right atrial size was severely dilated Pericardium: There is no evidence of pericardial effusion. Mitral Valve: The mitral valve is normal in structure. No evidence of mitral valve stenosis by observation. No evidence of mitral valve regurgitation. Tricuspid Valve: The tricuspid valve is normal in structure. Tricuspid valve regurgitation is not demonstrated. Aortic Valve: The aortic valve is normal in structure. Aortic valve regurgitation is not visualized. The aortic valve is structurally normal, with no evidence of sclerosis or stenosis. Pulmonic Valve:  The pulmonic valve was normal in structure. Pulmonic valve regurgitation is not visualized. Aorta: The aortic root, ascending aorta and aortic arch are all structurally normal, with no evidence of dilitation or obstruction. Venous: The inferior vena cava is dilated in size with less than 50% respiratory variability, suggesting  right atrial pressure of 15 mmHg. IAS/Shunts: No atrial level shunt detected by color flow Doppler. No ventricular septal defect is seen or detected. There is no evidence of an atrial septal defect.  LEFT VENTRICLE PLAX 2D LVIDd:         3.61 cm LVIDs:         2.46 cm LV PW:         1.29 cm LV IVS:        1.33 cm LVOT diam:     1.80 cm LV SV:         33 ml LV SV Index:   16.36 LVOT Area:     2.54 cm  LV Volumes (MOD) LV area d, A2C:    22.40 cm LV area d, A4C:    19.40 cm LV area s, A2C:    10.70 cm LV area s, A4C:    11.40 cm LV major d, A2C:   6.43 cm LV major d, A4C:   6.06 cm LV major s, A2C:   4.49 cm LV major s, A4C:   5.18 cm LV vol d, MOD A2C: 62.1 ml LV vol d, MOD A4C: 49.5 ml LV vol s, MOD A2C: 21.6 ml LV vol s, MOD A4C: 19.7 ml LV SV MOD A2C:     40.5 ml LV SV MOD A4C:     49.5 ml LV SV MOD BP:      35.3 ml RIGHT VENTRICLE             IVC RV Basal diam:  4.70 cm     IVC diam: 2.44 cm RV Mid diam:    4.26 cm RV S prime:     14.80 cm/s TAPSE (M-mode): 2.6 cm LEFT ATRIUM             Index       RIGHT ATRIUM           Index LA diam:        2.70 cm 1.32 cm/m  RA Area:     27.00 cm LA Vol (A2C):   84.8 ml 41.44 ml/m RA Volume:   95.50 ml  46.67 ml/m LA Vol (A4C):   53.6 ml 26.20 ml/m LA Biplane Vol: 66.7 ml 32.60 ml/m  AORTIC VALVE LVOT Vmax:   137.00 cm/s LVOT Vmean:  80.900 cm/s LVOT VTI:    0.207 m  AORTA Ao Root diam: 2.90 cm Ao Asc diam:  3.60 cm  SHUNTS Systemic VTI:  0.21 m Systemic Diam: 1.80 cm  Skeet Latch MD Electronically signed by Skeet Latch MD Signature Date/Time: 04/15/2019/3:35:19 PM    Final    [place] EKG SITE RITE  Result Date: 04/16/2019 If Site Rite image not attached, placement could not be confirmed due to current cardiac rhythm.   ASSESSMENT & PLAN:   This is a 63 year old male with:  1. Extensive stage small cell lung cancer -The patient was noted to have a possible mass on chest x-ray from 10/27/2018. -CT of the chest performed on 8/24/2020demonstrated  a 5.9 cm mass in the posterior left upper lobe possible lymphangitic spread and small mediastinal lymph nodes -PET scan performed on 03/01/2019 demonstrated a hypermetabolic 8.5 x 4.8  cm in the posterior left upper lobe of the lung with direct peripheral left stool invasion between the lateral left fifth and sixth ribs and direct left paraspinal invasion at the T4 level. Additionally there were also hypermetabolic ipsilateral mediastinal lymph nodes, hypermetabolic distant nodal metastasis in the left external iliac chain, hypermetabolic posterior left chest wall metastasis between the left sixth and seventh ribs, and hypermetabolic osseous metastases to the left sixth rib. -The patient was admitted to the hospital on 04/03/2019 and a CT of the chest showed new near complete collapse of the left upper lobe secondary to progression of the patient's known left upper lobe lung mass, compressing the left upper lobe bronchus. There also multiple new low-attenuation masses in the liver concerning for hepatic metastatic disease, progression in the mediastinal lymph nodes, and new pulmonary opacities in the right upper and right lower lobes. -CT-guided biopsy of the hypermetabolic left posterior chest wall nodule performed on 04/13/2019 demonstrated small cell carcinoma. -The patient initiated radiation to the left lung mass on 04/16/2019. -MRI of the brain on 04/17/2019 demonstrated at least 2 small left cerebellar metastatic lesions and suspected small lesion adjacent to the right frontal horn. -The patient will continue radiation to the left lung mass under the care of Dr. Lisbeth Renshaw. Plan -Tolerated cycle 1 of chemotherapy fairly well with the exception of mild nausea. He has antiemetics available to him. -Labs from today have been reviewed and the patient has mild leukocytosis likely due to Granix, mild anemia, and is developing mild thrombocytopenia all of which are expected from his chemotherapy  regimen. -Continue Granix 480 mcg subcu daily for total of 5 days. -continue and complete his planned course of lung RT as per Dr. Ida Rogue plan.   2.Acute hypoxic respiratory failure due to also lung cancer with associated postobstructive atelectasis/pneumonia and COPD exacerbation -Respiratory status slowly improving -Ongoing management per PCCM-much appreciate their excellent care  3. Mild thrombocytopenia-platelets down to 106K today.  This is due to his recent chemotherapy.  We will continue to monitor this closely.  4. Hypercalcemia of malignancy -Calcium was 14.1 on admission and now corrected -Status post zoledronic acid on 03/26/2019 and he also received calcitonin and IV hydration -Corrected calcium approximately 8 based on prior albumin of 2.4 -25-hydroxy vitamin D level 15.76 on 04/22/2019.  He has been started on cholecalciferol 1000 units daily with plans to replace to maintain levels close to 60 to avoid rebound hypocalcemia from his bisphosphonates. -Will need every 4 weekly Zometa to address bone metastases as outpatient.  5. Sinus tachycardia/SVT -On diltiazem and metoprolol  6. Chronic lymphedema -Followed by wound clinic as an outpatient  7.  DVT prophylaxis -on Lovenox as per PCCM team   Mikey Bussing, DNP, AGPCNP-BC, AOCNP   04/26/2019 10:44 AM   ADDENDUM  .Patient was Personally and independently interviewed, examined and relevant elements of the history of present illness were reviewed in details and an assessment and plan was created. All elements of the patient's history of present illness , assessment and plan were discussed in details with Mikey Bussing, DNP. The above documentation reflects our combined findings assessment and plan.  Sullivan Lone MD MS

## 2019-04-26 NOTE — Progress Notes (Signed)
NAME:  Omar Wagner, MRN:  [id number], DOB:  [birth year], LOS: 41 ADMISSION DATE:  03/30/2019, CONSULTATION DATE:  04/13/19 REFERRING MD: Louellen Molder, MD CHIEF COMPLAINT:  Lung mass with mets  Brief History   63 year old male smoker with very severe COPD (FEV1 33%) who presents with acute hypoxemic respiratory failure secondary to post-obstructive pneumonia in setting of LUL compression/collapse from left lung mass from extensive stage SCLC.  Past Medical History  Depression, basal cell carcinoma, Heroin abuse  Significant Hospital Events   11/26 Admitted 11/27 CT guided biopsy of soft tissue nodule in the left posterior chest wall 11/28 pulmonary consulted.  Recommended routine bronchodilator therapy antibiotics and prednisone taper 11/29 Tachy/brady with HTN.  Cards consulted >> add amiodarone. VDRF, bronch >> endobronchial tumor in LUL and compression LLL 11/30 recurrent SVT. Diagnostic bronchoscopy was significant for endobronchial tumor burden in LUL and extrinsic compression of airways in LLL. No biopsy taken 12/01 Intermittent arrhythmias, but less frequent.  Start vent weaning. 12/03 extubated 12/05 d/c amiodarone 12/9 run of SVT during the night.  Treated with calcium channel blocker.  4-day #8 of 12 radiation therapy.  Has completed day 3 of 3 planned chemotherapy today watching blood counts Consults:  IR, Oncology, Rad/Onc  Procedures:  PICC 11/30>  Significant Diagnostic Tests:  PET 03/01/19 >> 8.5 cm LUL mass hypermetabolic with invasion Lt chest wall, ipsilateral mediastinal LN met, Lt external iliac chain LN met, mets in Lt 6 and 7 ribs, 5 mm RUL nodule CT Chest W Contrast 04/04/2019 >> collapse LUL, progression of malignancy, post ob PNA Lt lung 2d ECHO 04/15/19 >> EF 60 to 65%, mild LVH, severe RA dilation MRI brain 04/17/19 >> at least 2 small left cerebellar metastatic lesions, possible small metastatic lesion adjacent to the right frontal horn  Micro Data:   BCx 03/31/2019 >> negative  Antimicrobials:  Zosyn 11/26 > 12/03  Interim history/subjective:  Breathing easier today  Objective   Blood pressure 108/66, pulse (Abnormal) 101, temperature (Abnormal) 97.5 F (36.4 C), temperature source Oral, resp. rate 16, height '6\' 1"'  (1.854 m), weight 91 kg, SpO2 96 %.        Intake/Output Summary (Last 24 hours) at 04/26/2019 1047 Last data filed at 04/26/2019 0400 Gross per 24 hour  Intake 230.15 ml  Output 875 ml  Net -644.85 ml   Filed Weights   04/23/19 0500 04/25/19 0427 04/26/19 0429  Weight: 93.2 kg 92.5 kg 91 kg  still needing 6 to 8 liters Physical Exam:  Genera chronically ill 63 year old white male resting in chair no distress HENT poor dentition. MMM no JVD Pulm scattered wheezing and rhonchi. Improved air movement on left Card currently NSR abd not tender  GU voiding  Ext diffuse edema/anasarca scattered areas of ecchymosis   Resolved Hospital Problem list   VDRF Hyperglycemia COPD exacerbation Postobstructive pneumonia  Assessment & Plan:   Extensive SCLC with brain met. - oncology, radiation oncology following, he has completed day 3 of cycle 3 and his chemotherapy with carboplatin and etoposide Plan Cont to watch blood count For # 9 of 12 XRT Additional recs per ONC  Cancer pain. Plan  Cont apap, oxy and fent as needed.     Acute hypoxic respiratory from [place] with airway compression, post obstructive pneumonia, and severe COPD with emphysema. Clinically he looks better He is actually able to lay in the left side-lying position which would suggest improved aeration of the left hemithorax Plan Cont scheduled BDs  IS/flutter and mobilization Pulse ox Intermittent CXR  Multifocal atrial tachycardia. -Had supraventricular tachycardia again last night. Plan Cont telemetry  Cont current lopressor dosing  Will try to transition to CCB   Fluid and electrolyte imbalance: Hypomagnesemia, Hypokalemia,  hyperchloremia Plan Replace Mg and K as needed  Leukocytosis ->got Granix 12/7 Plan Trend cbc  Sacral deep tissue pressure injury (not present on admission) Lower leg venous stasis. Plan Mobilize  Maximize nutrition eucerin cream to legs and silicone foam dressing to sacrum     Hypercalcemia of malignancy. - tx with zoledronic acid 03/23/2019 Plan He will get zoledronic acid every 4 weeks as directed by ONC  Vit D deficiency. Plan Cont vit D  Dysphagia. Plan Cont dysphagia diet   Best practice:  Diet: D3 diet DVT prophylaxis: lovenox GI prophylaxis: not indicated Mobility: activity as tolerated Code Status: okay with Bipap.  Otherwise DNR/DNI Disposition: SDU status    Erick Colace ACNP-BC [place] Pager # [phone] OR # [phone] if no answer

## 2019-04-26 NOTE — Progress Notes (Signed)
PMT no charge note  Chart reviewed, palliative consult noted, patient being transported for radiation this afternoon, at the time of my visit, full note and final palliative recommendations to follow.   Thank you for the consult.   Loistine Chance MD [facility] [phone]

## 2019-04-26 NOTE — Progress Notes (Signed)
Occupational Therapy Treatment Patient Details Name: Omar Wagner MRN: [id number] DOB: 06-12-1955 Today's Date: 04/26/2019    History of present illness 63 year old male  who admitted with acute hypoxemic respiratory failure secondary to post-obstructive pneumonia in setting of LUL compression/collapse from left lung mass from extensive stage SCLC. PMH: COPD, chronic lymphadema, COPD (FEV1 33%).  Vent 11/29 - 12/3   OT comments  Pt with improved activity tolerance today. Standing/walking are his priorities  Follow Up Recommendations  Supervision/Assistance - 24 hour    Equipment Recommendations  3 in 1 bedside commode    Recommendations for Other Services      Precautions / Restrictions Precautions Precautions: Fall Precaution Comments: monitor VS/O2 dependent Restrictions Weight Bearing Restrictions: No       Mobility Bed Mobility               General bed mobility comments: oob  Transfers   Equipment used: Rolling walker (2 wheeled)   Sit to Stand: Min assist;+2 safety/equipment         General transfer comment: second person for lines; multimodal cues to reach back prior to sitting    Balance                                           ADL either performed or assessed with clinical judgement   ADL                           Toilet Transfer: Minimal assistance;+2 for safety/equipment(lines)             General ADL Comments: worked on sit to stand for [employer], functional mobility     Manufacturing systems engineer      Cognition Arousal/Alertness: Awake/alert Behavior During Therapy: WFL for tasks assessed/performed                           Following Commands: (multimodal cues at times for hand placement)       General Comments: extra processing time        Exercises     Shoulder Instructions       General Comments HR 102-118.  Sats in high 90s    Pertinent Vitals/ Pain        Pain Assessment: Faces Faces Pain Scale: Hurts even more Pain Location: chest radiating to stomach Pain Descriptors / Indicators: Aching Pain Intervention(s): Limited activity within patient's tolerance;Monitored during session;Patient requesting pain meds-RN notified;RN gave pain meds during session;Repositioned(RN did EKG)  Home Living                                          Prior Functioning/Environment              Frequency  Min 2X/week        Progress Toward Goals  OT Goals(current goals can now be found in the care plan section)  Progress towards OT goals: Progressing toward goals     Plan      Co-evaluation    PT/OT/SLP Co-Evaluation/Treatment: Yes Reason for Co-Treatment: For patient/therapist safety PT goals addressed during session: Mobility/safety with mobility OT goals addressed during session: ADL's  and self-care      AM-PAC OT "6 Clicks" Daily Activity     Outcome Measure   Help from another person eating meals?: None Help from another person taking care of personal grooming?: A Little Help from another person toileting, which includes using toliet, bedpan, or urinal?: A Little Help from another person bathing (including washing, rinsing, drying)?: A Lot Help from another person to put on and taking off regular upper body clothing?: A Little Help from another person to put on and taking off regular lower body clothing?: A Lot 6 Click Score: 17    End of Session    OT Visit Diagnosis: Unsteadiness on feet (R26.81)   Activity Tolerance Patient tolerated treatment well   Patient Left in chair;with call bell/phone within reach;with chair alarm set   Nurse Communication          Time: 4832-3468 OT Time Calculation (min): 38 min  Charges: OT General Charges $OT Visit: 1 Visit OT Treatments $Therapeutic Activity: 23-37 mins  Murriel Holwerda S, OTR/L Acute Rehabilitation  Services 04/26/2019   [place] 04/26/2019, 12:35 PM

## 2019-04-26 NOTE — Progress Notes (Signed)
Physical Therapy Treatment Patient Details Name: Omar Wagner MRN: [id number] DOB: 1956-03-31 Today's Date: 04/26/2019    History of Present Illness 63 year old male  who admitted with acute hypoxemic respiratory failure secondary to post-obstructive pneumonia in setting of LUL compression/collapse from left lung mass from extensive stage SCLC. PMH: COPD, chronic lymphadema, COPD (FEV1 33%).  Vent 11/29 - 12/3    PT Comments    Pt requiring encouragement but with noted improvement in activity tolerance and quality. of transfers and gait.  Follow Up Recommendations  Home health PT;Supervision - Intermittent     Equipment Recommendations  Other (comment)(rollator)    Recommendations for Other Services       Precautions / Restrictions Precautions Precautions: Fall Precaution Comments: monitor VS/O2 dependent Restrictions Weight Bearing Restrictions: No    Mobility  Bed Mobility               General bed mobility comments: oob  Transfers Overall transfer level: Needs assistance Equipment used: Rolling walker (2 wheeled) Transfers: Sit to/from [employer] Transfers Sit to Stand: Min assist;+2 safety/equipment Stand pivot transfers: Min guard;Min assist       General transfer comment: second person for lines; multimodal cues to reach back prior to sitting  Ambulation/Gait Ambulation/Gait assistance: Min assist;+2 physical assistance;+2 safety/equipment Gait Distance (Feet): 28 Feet Assistive device: Rolling walker (2 wheeled) Gait Pattern/deviations: Step-to pattern;Decreased step length - right;Decreased step length - left;Shuffle;Trunk flexed Gait velocity: decr   General Gait Details: cues for posture and position from RW; distance ltd by fatigue   Stairs             Wheelchair Mobility    Modified Rankin (Stroke Patients Only)       Balance Overall balance assessment: Needs assistance Sitting-balance support: No upper extremity  supported;Feet supported Sitting balance-Leahy Scale: Good     Standing balance support: Bilateral upper extremity supported Standing balance-Leahy Scale: Poor Standing balance comment: reliant on UE support                            Cognition Arousal/Alertness: Awake/alert Behavior During Therapy: WFL for tasks assessed/performed Overall Cognitive Status: No family/caregiver present to determine baseline cognitive functioning Area of Impairment: Safety/judgement;Following commands                       Following Commands: Follows one step commands consistently;Follows multi-step commands with increased time Safety/Judgement: Decreased awareness of safety     General Comments: extra processing time      Exercises      General Comments General comments (skin integrity, edema, etc.): HR 102 - 118; Sats in 90s      Pertinent Vitals/Pain Pain Assessment: Faces Faces Pain Scale: Hurts even more Pain Location: chest radiating to stomach Pain Descriptors / Indicators: Aching Pain Intervention(s): Limited activity within patient's tolerance;Monitored during session;RN gave pain meds during session(RN did EKG prior to mobilizing pt)    Home Living                      Prior Function            PT Goals (current goals can now be found in the care plan section) Acute Rehab PT Goals Patient Stated Goal: agreeable to OOB PT Goal Formulation: With patient Time For Goal Achievement: 05/08/19 Potential to Achieve Goals: Fair Progress towards PT goals: Progressing toward goals    Frequency  Min 3X/week      PT Plan Current plan remains appropriate    Co-evaluation PT/OT/SLP Co-Evaluation/Treatment: Yes Reason for Co-Treatment: For patient/therapist safety PT goals addressed during session: Mobility/safety with mobility OT goals addressed during session: ADL's and self-care      AM-PAC PT "6 Clicks" Mobility   Outcome Measure  Help  needed turning from your back to your side while in a flat bed without using bedrails?: None Help needed moving from lying on your back to sitting on the side of a flat bed without using bedrails?: A Little Help needed moving to and from a bed to a chair (including a wheelchair)?: A Little Help needed standing up from a chair using your arms (e.g., wheelchair or bedside chair)?: A Little Help needed to walk in hospital room?: A Little Help needed climbing 3-5 steps with a railing? : A Lot 6 Click Score: 18    End of Session Equipment Utilized During Treatment: Gait belt;Oxygen Activity Tolerance: Patient limited by fatigue Patient left: in chair;with call bell/phone within reach;with chair alarm set Nurse Communication: Mobility status PT Visit Diagnosis: Difficulty in walking, not elsewhere classified (R26.2)     Time: 0935-1010 PT Time Calculation (min) (ACUTE ONLY): 35 min  Charges:  $Gait Training: 8-22 mins                     [place] Pager [phone] Office [phone]    Adal Sereno 04/26/2019, 12:58 PM

## 2019-04-26 NOTE — Progress Notes (Signed)
  Speech Language Pathology Treatment: Dysphagia  Patient Details Name: Omar Wagner MRN: [id number] DOB: [birth year] Today's Date: 04/26/2019 Time: 3094-0768 SLP Time Calculation (min) (ACUTE ONLY): 25 min  Assessment / Plan / Recommendation Clinical Impression  Pt benefited from moderate cues to take small boluses as session progressed - suspect discomfort from being observed with meals.  SLP reviewed importance of pt resting if dyspneic, assuring oxygen in nares when eating, and consuming small frequent meals.   During meal, pt's RR varied from 11 at rest to 24 during intake with obvious diaphragmatic breathing.  Oxygen saturation decreased to low 80s during meal but oxygen was not on nares.  Pt denies awareness to oxygen decline.   No overt indication of aspiration (subtle cough x4- did not appear coorelated to po intake) during meal however recommend intermittent supervision for maximal safety; all education re: consumption of po with respiratory deficits reviewed and pt reports understanding with teach back. Will sign off as pt appears to be tolerating diet, is satisfied with soft diet and denies intolerance.    HPI HPI: 63yo male admitted 04/07/2019 with SOB. PMH: severe COPD, lung mass with brain/bone mets. 04/15/2019 declined status, to ICU intubated due to acute hypoxemic respiratory failure d/t post-obstructive PNA, LUL compression/collapse from Left lung mass.  intubated 11/29 - 12/3 CXR = Stable complete opacification of the left hemithorax. The right lung is clear.      SLP Plan  All goals met       Recommendations  Liquids provided via: Cup;Straw;Teaspoon Medication Administration: Whole meds with puree Supervision: Patient able to self feed Compensations: Minimize environmental distractions;Small sips/bites;Slow rate Postural Changes and/or Swallow Maneuvers: Seated upright 90 degrees;Upright 30-60 min after meal                Oral Care Recommendations: Oral care  before and after PO Follow up Recommendations: Other (comment)(TBD) SLP Visit Diagnosis: Dysphagia, unspecified (R13.10) Plan: All goals met       GO                Macario Golds 04/26/2019, 4:14 PM  Luanna Salk, [place] [facility] SLP Acute Rehab Services Pager [phone] Office [phone]

## 2019-04-27 ENCOUNTER — Encounter: Payer: Self-pay | Admitting: Radiation Oncology

## 2019-04-27 ENCOUNTER — Ambulatory Visit
Admit: 2019-04-27 | Discharge: 2019-04-27 | Disposition: A | Discharge status: Home / Self Care | Payer: Medicare Other | Attending: Radiation Oncology | Admitting: Radiation Oncology

## 2019-04-27 DIAGNOSIS — Z7189 Other specified counseling: Secondary | ICD-10-CM

## 2019-04-27 DIAGNOSIS — R0602 Shortness of breath: Secondary | ICD-10-CM

## 2019-04-27 DIAGNOSIS — R52 Pain, unspecified: Secondary | ICD-10-CM

## 2019-04-27 DIAGNOSIS — Z515 Encounter for palliative care: Secondary | ICD-10-CM

## 2019-04-27 LAB — BLOOD GAS, ARTERIAL
Acid-Base Excess: 2 mmol/L (ref 0.0–2.0)
Bicarbonate: 26.9 mmol/L (ref 20.0–28.0)
Drawn by: 51425
O2 Content: 7 L/min
O2 Saturation: 93.4 %
Patient temperature: 98.3
pCO2 arterial: 45.2 mmHg (ref 32.0–48.0)
pH, Arterial: 7.391 (ref 7.350–7.450)
pO2, Arterial: 58.8 mmHg — ABNORMAL LOW (ref 83.0–108.0)

## 2019-04-27 LAB — PHOSPHORUS
Phosphorus: 2.1 mg/dL — ABNORMAL LOW (ref 2.5–4.6)
Phosphorus: 1.5 mg/dL — ABNORMAL LOW (ref 2.5–4.6)

## 2019-04-27 LAB — CALCIUM
Calcium: 7.3 mg/dL — ABNORMAL LOW (ref 8.9–10.3)
Calcium: 7.4 mg/dL — ABNORMAL LOW (ref 8.9–10.3)
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
## 2019-04-27 NOTE — Progress Notes (Signed)
eLink Physician-Brief Progress Note Patient Name: Omar Wagner DOB: [birth year] MRN: [id number]   Date of Service  04/27/2019  HPI/Events of Note  Bedside RN concerned of transfer to floor as patient seemed more short of breath and more confused requiring high flow 7L. Discussed with RT   eICU Interventions  Ordered ABG and assess need for BiPap. Hold transfer for now     Intervention Category Intermediate Interventions: Respiratory distress - evaluation and management  Judd Lien 04/27/2019, 9:16 PM

## 2019-04-27 NOTE — Progress Notes (Signed)
Late entry. Previous nurse had voiced concerns of patient  transfering to med-surg tele. Voiced that patient had required progressive care this shift. Previous nurse had contacted ELink for evaluation request.

## 2019-04-27 NOTE — Progress Notes (Signed)
Oncology Short note  Patient continues to remain in stepdown unit with significant respiratory limitations from left-sided lung collapse.  He has had very severe COPD with baseline further compromised by extensive stage small cell lung cancer. He is completing his palliative radiation.  He did tolerate his first cycle of carboplatin etoposide without overt immediate toxicities at this time.  Blood counts thus far doing okay.  He shall be completing his 5 days of Granix for neutropenia prophylaxis. We shall continue to follow him in the hospital as needed. He does face a challenging course of recovery given significant limitations to his performance status and respiratory status at this time. We will need continuing goals of care discussion.  Appreciate palliative care input.  Appreciate excellent pulmonary and critical care help. Appreciate radiation oncology note.  Sullivan Lone MD MS

## 2019-04-27 NOTE — Progress Notes (Signed)
PT Cancellation Note  Patient Details Name: Omar Wagner MRN: [id number] DOB: [birth year]   Cancelled Treatment:    Reason Eval/Treat Not Completed: Patient at procedure or test/unavailable RN reports not a good time for PT as pt to go for radiation shortly. Will check back as schedule permits.   Job Holtsclaw,KATHrine E 04/27/2019, 1:38 PM Jannette Spanner PT, DPT Acute Rehabilitation Services Office: [phone]

## 2019-04-27 NOTE — Progress Notes (Signed)
NAME:  Omar Wagner, MRN:  [id number], DOB:  06/05/1955, LOS: 51 ADMISSION DATE:  03/29/2019, CONSULTATION DATE:  04/13/19 REFERRING MD: Louellen Molder, MD CHIEF COMPLAINT:  Lung mass with mets  Brief History   63 year old male smoker with very severe COPD (FEV1 33%) who presents with acute hypoxemic respiratory failure secondary to post-obstructive pneumonia in setting of LUL compression/collapse from left lung mass from extensive stage SCLC.  Past Medical History  Depression, basal cell carcinoma, Heroin abuse  Significant Hospital Events   11/26 Admitted 11/27 CT guided biopsy of soft tissue nodule in the left posterior chest wall 11/28 pulmonary consulted.  Recommended routine bronchodilator therapy antibiotics and prednisone taper 11/29 Tachy/brady with HTN.  Cards consulted >> add amiodarone. VDRF, bronch >> endobronchial tumor in LUL and compression LLL 11/30 recurrent SVT. Diagnostic bronchoscopy was significant for endobronchial tumor burden in LUL and extrinsic compression of airways in LLL. No biopsy taken 12/01 Intermittent arrhythmias, but less frequent.  Start vent weaning. 12/03 extubated 12/05 d/c amiodarone 12/9 run of SVT during the night.  Treated with calcium channel blocker.  4-day #8 of 12 radiation therapy.  Has completed day 3 of 3 planned chemotherapy today watching blood counts 12/10 on-going XRT; oxygen weaning.  12/11 seen by palliative .  Consults:  IR, Oncology, Rad/Onc  Procedures:  PICC 11/30>  Significant Diagnostic Tests:  PET 03/01/19 >> 8.5 cm LUL mass hypermetabolic with invasion Lt chest wall, ipsilateral mediastinal LN met, Lt external iliac chain LN met, mets in Lt 6 and 7 ribs, 5 mm RUL nodule CT Chest W Contrast 04/11/2019 >> collapse LUL, progression of malignancy, post ob PNA Lt lung 2d ECHO 04/15/19 >> EF 60 to 65%, mild LVH, severe RA dilation MRI brain 04/17/19 >> at least 2 small left cerebellar metastatic lesions, possible small  metastatic lesion adjacent to the right frontal horn  Micro Data:  BCx 04/08/2019 >> negative  Antimicrobials:  Zosyn 11/26 > 12/03  Interim history/subjective:  No distress.   Objective   Blood pressure 110/62, pulse 88, temperature 98.3 F (36.8 C), resp. rate 17, height _0  (1.854 m), weight 89.3 kg, SpO2 95 %.        Intake/Output Summary (Last 24 hours) at 04/27/2019 0941 Last data filed at 04/27/2019 0038 Gross per 24 hour  Intake 608.69 ml  Output 750 ml  Net -141.31 ml   Filed Weights   04/25/19 0427 04/26/19 0429 04/27/19 0414  Weight: 92.5 kg 91 kg 89.3 kg  7 liters   Physical Exam:  General 63 year old white male sitting up in bed no distress but still has sig WOB HENT- NCAT no JVD MMM Pulm cont to have scattered rales and wheezing  Card RRR w/ intermittent runs of PAF abd soft not tender. Tolerating diet  Ext chronic LE edema. W/ chronic LE changes.  Neuro intact. No focal def    Resolved Hospital Problem list   VDRF Hyperglycemia COPD exacerbation Postobstructive pneumonia  Assessment & Plan:   Extensive SCLC with brain met. - oncology, radiation oncology following, he has completed day 3 of cycle 3 and his chemotherapy with carboplatin and etoposide Plan Cont to watch blood counts For 10 of 12 XRT Additional recs per onc and palliative  Cancer pain. Plan  Cont PRN analgesia    Acute hypoxic respiratory from [place] with airway compression, post obstructive pneumonia, and severe COPD with emphysema. Clinically he looks better He is actually able to lay in the left  side-lying position which would suggest improved aeration of the left hemithorax Plan Cont scheduled BDs Cont oxygen  Pulse ox  Mobilize  Multifocal atrial tachycardia. -Had supraventricular tachycardia again last night. Plan Cont current CCB dosing  Cont tele No heparin d/t brain mets   Intermittent Fluid and electrolyte imbalance: hypophosphatemia He is 6 liters  + Plan Add lasix   Leukocytosis ->got Granix 12/7 Plan Trend cbc  Sacral deep tissue pressure injury (not present on admission) Lower leg venous stasis. Plan Cont to maximize nutrition  Cont eucerin cream on legs and silicone foam dressing to sacrum   Hypercalcemia of malignancy. - tx with zoledronic acid 04/07/2019 Plan Will cont zoledronic acid every 4 weeks as directed by onc.   Vit D deficiency. Plan Cont vit D   Dysphagia. Plan Cont dysphagia diet   Best practice:  Diet: D3 diet DVT prophylaxis: lovenox GI prophylaxis: not indicated Mobility: activity as tolerated Code Status: okay with Bipap.  Otherwise DNR/DNI Disposition: to tele     Erick Colace ACNP-BC [place] Pager # [phone] OR # [phone] if no answer

## 2019-04-28 ENCOUNTER — Inpatient Hospital Stay (HOSPITAL_COMMUNITY): Payer: Medicare Other

## 2019-04-28 DIAGNOSIS — J9602 Acute respiratory failure with hypercapnia: Secondary | ICD-10-CM

## 2019-04-28 LAB — BASIC METABOLIC PANEL
Anion gap: 9 (ref 5–15)
BUN: 14 mg/dL (ref 8–23)
CO2: 30 mmol/L (ref 22–32)
Calcium: 7.7 mg/dL — ABNORMAL LOW (ref 8.9–10.3)
Chloride: 105 mmol/L (ref 98–111)
Creatinine, Ser: 0.6 mg/dL — ABNORMAL LOW (ref 0.61–1.24)
GFR calc Af Amer: >60 mL/min (ref >60)
GFR calc non Af Amer: >60 mL/min (ref >60)
Glucose, Bld: 117 mg/dL — ABNORMAL HIGH (ref 70–99)
Potassium: 3.6 mmol/L (ref 3.5–5.1)
Sodium: 144 mmol/L (ref 135–145)

## 2019-04-28 LAB — GLUCOSE, CAPILLARY: Glucose-Capillary: 104 mg/dL — ABNORMAL HIGH (ref 70–99)

## 2019-04-28 LAB — PHOSPHORUS: Phosphorus: 2.9 mg/dL (ref 2.5–4.6)

## 2019-04-28 MED ORDER — POLYVINYL ALCOHOL 1.4 % OP SOLN: 1.0000 [drp] | Freq: Four times a day (QID) | OPHTHALMIC | Status: DC | PRN

## 2019-04-28 MED ORDER — LORAZEPAM 2 MG/ML IJ SOLN
1.0000 mg | INTRAMUSCULAR | Status: DC | PRN
  Administered 2019-04-29: 1 mg via INTRAVENOUS
  Filled 2019-04-28: qty 1

## 2019-04-28 MED ORDER — LORAZEPAM 1 MG PO TABS: 1.0000 mg | ORAL_TABLET | ORAL | Status: DC | PRN

## 2019-04-28 MED ORDER — GLYCOPYRROLATE 0.2 MG/ML IJ SOLN: 0.2000 mg | INTRAMUSCULAR | Status: DC | PRN

## 2019-04-28 MED ORDER — ONDANSETRON 4 MG PO TBDP: 4.0000 mg | ORAL_TABLET | Freq: Four times a day (QID) | ORAL | Status: DC | PRN

## 2019-04-28 MED ORDER — ONDANSETRON HCL 4 MG/2ML IJ SOLN: 4.0000 mg | Freq: Four times a day (QID) | INTRAMUSCULAR | Status: DC | PRN

## 2019-04-28 MED ORDER — HALOPERIDOL 1 MG PO TABS: 0.5000 mg | ORAL_TABLET | ORAL | Status: DC | PRN

## 2019-04-28 MED ORDER — NALOXONE HCL 0.4 MG/ML IJ SOLN
0.4000 mg | INTRAMUSCULAR | Status: DC | PRN
  Administered 2019-04-28: 0.4 mg via INTRAVENOUS
  Filled 2019-04-28: qty 1

## 2019-04-28 MED ORDER — GLYCOPYRROLATE 1 MG PO TABS: 1.0000 mg | ORAL_TABLET | ORAL | Status: DC | PRN

## 2019-04-28 MED ORDER — LORAZEPAM 2 MG/ML PO CONC: 1.0000 mg | ORAL | Status: DC | PRN

## 2019-04-28 MED ORDER — FUROSEMIDE 10 MG/ML IJ SOLN
40.0000 mg | Freq: Once | INTRAMUSCULAR | Status: AC
  Administered 2019-04-28: 40 mg via INTRAVENOUS

## 2019-04-28 MED ORDER — BISACODYL 10 MG RE SUPP: 10.0000 mg | Freq: Every day | RECTAL | Status: DC | PRN

## 2019-04-28 MED ORDER — IPRATROPIUM-ALBUTEROL 0.5-2.5 (3) MG/3ML IN SOLN: 3.0000 mL | Freq: Four times a day (QID) | RESPIRATORY_TRACT | Status: DC

## 2019-04-28 MED ORDER — HALOPERIDOL LACTATE 5 MG/ML IJ SOLN: 0.5000 mg | INTRAMUSCULAR | Status: DC | PRN

## 2019-04-28 MED ORDER — METHYLPREDNISOLONE SODIUM SUCC 125 MG IJ SOLR
80.0000 mg | Freq: Two times a day (BID) | INTRAMUSCULAR | Status: DC
  Administered 2019-04-28 – 2019-04-29 (×2): 80 mg via INTRAVENOUS
  Filled 2019-04-28 (×2): qty 2

## 2019-04-28 MED ORDER — FLEET ENEMA 7-19 GM/118ML RE ENEM: 1.0000 | ENEMA | Freq: Every day | RECTAL | Status: DC | PRN

## 2019-04-28 MED ORDER — FUROSEMIDE 10 MG/ML IJ SOLN
INTRAMUSCULAR | Status: AC
  Filled 2019-04-28: qty 4

## 2019-04-28 MED ORDER — HALOPERIDOL LACTATE 2 MG/ML PO CONC: 0.5000 mg | ORAL | Status: DC | PRN

## 2019-04-28 NOTE — Progress Notes (Signed)
NAME:  Omar Wagner, MRN:  [id number], DOB:  27-Dec-1955, LOS: 67 ADMISSION DATE:  03/30/2019, CONSULTATION DATE:  04/13/19 REFERRING MD: Louellen Molder, MD CHIEF COMPLAINT:  Lung mass with mets  Brief History   63 year old male smoker with very severe COPD (FEV1 33%) who presented with acute hypoxemic respiratory failure secondary to post-obstructive pneumonia in setting of LUL compression/collapse from left lung mass from extensive stage SCLC.  Past Medical History  Depression, basal cell carcinoma, Heroin abuse  Significant Hospital Events   11/26 Admitted 11/27 CT guided biopsy of soft tissue nodule in the left posterior chest wall 11/28 pulmonary consulted.  Recommended routine bronchodilator therapy antibiotics and prednisone taper 11/29 Tachy/brady with HTN.  Cards consulted >> add amiodarone. VDRF, bronch >> endobronchial tumor in LUL and compression LLL 11/30 recurrent SVT. Diagnostic bronchoscopy was significant for endobronchial tumor burden in LUL and extrinsic compression of airways in LLL. No biopsy taken 12/01 Intermittent arrhythmias, but less frequent.  Start vent weaning. 12/03 extubated 12/05 d/c amiodarone 12/9 run of SVT during the night.  Treated with calcium channel blocker.  4-day #8 of 12 radiation therapy.  Has completed day 3 of 3 planned chemotherapy today watching blood counts 12/10 on-going XRT; oxygen weaning.  12/11 seen by palliative care 12/12 see E link note, full NCB  Consults:  IR, Oncology, Rad/Onc Palliative carre 12/11   Procedures:  PICC 11/30>  Significant Diagnostic Tests:  PET 03/01/19 >> 8.5 cm LUL mass hypermetabolic with invasion Lt chest wall, ipsilateral mediastinal LN met, Lt external iliac chain LN met, mets in Lt 6 and 7 ribs, 5 mm RUL nodule CT Chest W Contrast 04/09/2019 >> collapse LUL, progression of malignancy, post ob PNA Lt lung 2d ECHO 04/15/19 >> EF 60 to 65%, mild LVH, severe RA dilation MRI brain 04/17/19 >> at least 2  small left cerebellar metastatic lesions, possible small metastatic lesion adjacent to the right frontal horn  Micro Data:  BCx 04/06/2019 >> negative  Antimicrobials:  Zosyn 11/26 > 12/03  Interim history/subjective:  varialbly obtunded, poor cough mechanics   Objective   Blood pressure 119/65, pulse 87, temperature (!) 97.2 F (36.2 C), temperature source Axillary, resp. rate (!) 21, height _0  (1.854 m), weight 85.4 kg, SpO2 100 %.    FiO2 (%):  [100 %] 100 %   Intake/Output Summary (Last 24 hours) at 04/28/2019 1144 Last data filed at 04/28/2019 0545 Gross per 24 hour  Intake 180 ml  Output 2225 ml  Net -2045 ml   Filed Weights   04/26/19 0429 04/27/19 0414 04/28/19 0500  Weight: 91 kg 89.3 kg 85.4 kg  7 liters   Physical Exam:  Chronically ill wm lying flat in bed    No jvd Oropharynx clear,  mucosa dry  Neck supple Lungs with minimal rhonchi on R, absent on L  RRR no s3 or or sign murmur Abd soft with limited  excursion  Extr warm with no edema or clubbing noted        Resolved Hospital Problem list   VDRF Hyperglycemia COPD exacerbation Postobstructive pneumonia  Assessment & Plan:   Extensive SCLC with brain met. - oncology, radiation oncology following, he has completed day 3 of cycle 3 and his chemotherapy with carboplatin and etoposide  Plan Cont to watch blood counts  Additional recs per onc and palliative  Cancer pain. Plan  Cont PRN analgesia    Acute hypoxic respiratory from [place] with airway compression, post obstructive pneumonia,  and severe COPD with emphysema.   Plan Cont scheduled BDs Cont oxygen  Pulse ox  Mobilize NCB   Multifocal atrial tachycardia. - intermittent  Plan Cont current CCB dosing  Cont tele No heparin d/t brain mets   Intermittent Fluid and electrolyte imbalance: hypophosphatemia   Plan Diuresis minimally effective   Leukocytosis ->got Granix 12/7 Plan Trend cbc  Sacral deep tissue pressure  injury (not present on admission) Lower leg venous stasis. Plan Cont to maximize nutrition  Cont eucerin cream on legs and silicone foam dressing to sacrum   Hypercalcemia of malignancy. - tx with zoledronic acid 04/15/2019 Plan Will cont zoledronic acid every 4 weeks as directed by onc.   Vit D deficiency. Plan Cont vit D   Dysphagia. Plan Cont dysphagia diet   Shifting to palliative rx at this point   Clearly the high likelihood of prolonging suffering from pulmonary interventions vastly outweighs any reasonable chance of benefit from offering anything else because medical science has done all it can here to restore health.  Therefore  I don't have any additional recs  except to consider hospice sooner rather than later - paradoxically many patients with respiratory diseases live longer and better once a palliative approach is used in this setting.    Best practice:  Diet: D3 diet DVT prophylaxis: lovenox GI prophylaxis: not indicated Mobility: activity as tolerated Code Status: okay with Bipap.  Otherwise DNR/DNI Keep in ICU for now    Christinia Gully, MD Pulmonary and [place] [phone] After 5:30 PM or weekends, use [facility] [phone]

## 2019-04-28 NOTE — Progress Notes (Signed)
Patient found minimally responsive. Diaphoretic. BS 104. HFNC 7L, Sat 92%. Notified RT to assess. At bedside, neb scheduled administered. [name] using accessory muscles to breath. Respirations increased. Audible congestion noted. Yanker suction completed to patient's mouth. Patient partial code. Paged on call, Lennox Grumbles for advisement.

## 2019-04-28 NOTE — Progress Notes (Signed)
eLink Physician-Brief Progress Note Patient Name: Omar Wagner DOB: 12/15/1955 MRN: [id number]   Date of Service  04/28/2019  HPI/Events of Note  Patient progressively more confused overnight and is now unresponsive. [facility] was called and had given Narcan 0.4 mg without response.  They went ahead and called family as well who agreed that they want patient comfortable.  eICU Interventions  I spoke with Ms Durward Matranga, patient's sister who confirms patient be made DNR and to be made as comfortable as possible. I assured her that patient is not in distress and appears comfortable. She confirms that patient does not have a wife nor children, and that their parents have passed long time ago.     Intervention Category Major Interventions: Change in mental status - evaluation and management  Judd Lien 04/28/2019, 3:56 AM

## 2019-04-29 LAB — PHOSPHORUS: Phosphorus: 2.4 mg/dL — ABNORMAL LOW (ref 2.5–4.6)

## 2019-04-29 LAB — CALCIUM: Calcium: 7.8 mg/dL — ABNORMAL LOW (ref 8.9–10.3)

## 2019-04-29 MED ORDER — FENTANYL CITRATE (PF) 100 MCG/2ML IJ SOLN
50.0000 ug | INTRAMUSCULAR | Status: DC | PRN
  Administered 2019-04-29 (×2): 75 ug via INTRAVENOUS
  Administered 2019-04-29: 100 ug via INTRAVENOUS
  Filled 2019-04-29 (×3): qty 2

## 2019-04-29 MED ORDER — OXYCODONE HCL 5 MG PO TABS: 10.0000 mg | ORAL_TABLET | ORAL | Status: DC | PRN

## 2019-04-30 ENCOUNTER — Ambulatory Visit: Payer: Medicare Other

## 2019-05-01 ENCOUNTER — Ambulatory Visit: Payer: Medicare Other

## 2019-05-02 ENCOUNTER — Ambulatory Visit: Payer: Medicare Other

## 2019-05-04 ENCOUNTER — Telehealth: Payer: Self-pay

## 2019-05-04 NOTE — Telephone Encounter (Signed)
Received dc from [facility]. (original).  Dc is for cremation and a patient of Doctor Wert.   Dc will be taken to Pulmonary Unit for signature.

## 2019-05-08 ENCOUNTER — Telehealth: Payer: Self-pay

## 2019-05-08 NOTE — Telephone Encounter (Signed)
Recd signed DC from Dr. Melvyn Novas. Faxed copy to [facility] and called them for pick up.

## 2019-05-18 NOTE — Progress Notes (Addendum)
Patient passed at 79 with this RN and Leonie Man, RN at bedside. Sister, [name], notified of patient's passing. No funeral home decided at this point, patient's sister provided with patient placement's phone number. [facility] notified of patient's passing and patient is not a candidate for donation. [name] belongings sent to security, will call sister to coordinate pickup of belongings.

## 2019-05-18 NOTE — Progress Notes (Signed)
eLink Physician-Brief Progress Note Patient Name: Omar Wagner DOB: 08-31-1955 MRN: [id number]   Date of Service  May 13, 2019  HPI/Events of Note  Pain is inadequately controlled on current regimen. Receives 100 mcg IV fentanyl and in pain again 1 hour later but fentanyl is only ordered Q2H PRN.  eICU Interventions  Increase oral pain med (oxycodone) to 10mg  Q4H PRN. Increase breakthrough pain medication (fentanyl 50-100 mcg IV) frequency to Q1H PRN.     Intervention Category Intermediate Interventions: Pain - evaluation and management  Marily Lente [name] May 13, 2019, 12:40 AM

## 2019-05-18 NOTE — Discharge Summary (Signed)
Physician Discharge Summary         Patient ID: Omar Wagner MRN: [id number] DOB/AGE: September 05, 1955 64 y.o.  Admit date: 04/15/2019 Discharge date: 2019/05/06  Discharge Diagnoses:    Patient Active Problem List   Diagnosis Date Noted  . COPD GOLD III  08/02/2016    Priority: High  . Pulmonary nodule, left 10/31/2018    Priority: Medium  . Cigarette smoker 05/10/2018    Priority: Low  . Palliative care by specialist   . 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 Encounter for antineoplastic chemotherapy   . Pressure injury of skin 04/20/2019  . Counseling regarding advance care planning and goals of care 04/19/2019  . Small cell lung cancer ([place])   . Brain metastases ([place])   . Malignant neoplasm of upper lobe of left lung ([place]) 04/16/2019  . Atelectasis   . Acute respiratory failure ([place]) 04/15/2019  . Post-obstructive pneumonia due to foreign body aspiration 04/11/2019  . Mass of left lung 04/03/2019  . Multiple open wounds of lower leg 04/15/2019  . Hypercalcemia 04/09/2019  . Bilateral lower extremity edema 12/07/2017  . Elevated blood pressure reading 12/07/2017  . History of elevated blood pressure while in hospital 12/07/2017  . Cellulitis and abscess of leg 08/10/2014  . Bacteremia 01/17/2013  . Heroin withdrawal ([place]) 01/14/2013  . Acute diarrhea 01/13/2013  . Hypokalemia 01/13/2013  . Aspiration into airway 01/12/2013  . Substance abuse ([place]) 01/12/2013  . Acute encephalopathy 01/11/2013  . Cellulitis of multiple sites of lower extremity 01/11/2013     Discharge summary    Brief History   64 year old male smoker with very severe COPD (FEV1 33%) who presented with acute hypoxemic respiratory failure secondary to post-obstructive pneumonia in setting of LUL compression/collapse from left lung mass from extensive stage SCLC.  Past Medical History  Depression, basal cell carcinoma, Heroin abuse  Significant Hospital Events   11/26  Admitted 11/27 CT guided biopsy of soft tissue nodule in the left posterior chest wall 11/28 pulmonary consulted.  Recommended routine bronchodilator therapy antibiotics and prednisone taper 11/29 Tachy/brady with HTN.  Cards consulted >> add amiodarone. VDRF, bronch >> endobronchial tumor in LUL and compression LLL 11/30 recurrent SVT. Diagnostic bronchoscopy was significant for endobronchial tumor burden in LUL and extrinsic compression of airways in LLL. No biopsy taken 12/01 Intermittent arrhythmias, but less frequent.  Start vent weaning.  FOB 12/32/20 The left upper lobe was fully occluded by an exophytic irregularly shaped endobronchial mass that extended distally into the segmental airways. Was associated erythema, no active bleeding. The left lower lobe airways were slightly compressed but were patent. 12/03 extubated 12/05 d/c amiodarone 12/9 run of SVT during the night.  Treated with calcium channel blocker.  4-day #8 of 12 radiation therapy.  Has completed day 3 of 3 planned chemotherapy today watching blood counts 12/10 on-going XRT; oxygen weaning.  12/11 seen by palliative care 12/12 see E link note, full NCB   Pt had a progressive downhill course after extubation characterized by worsening sob/ cp related to the chest mass and desaturations and never regained any significant aeration of the L lung by cxr and was made comfort care / full ncb status as above and expired while palliative measures were in place.   Christinia Gully, MD Pulmonary and [place] [phone] After 5:30 PM or weekends, use Bee[facility] [phone]

## 2019-05-18 NOTE — Progress Notes (Signed)
NAME:  Omar Wagner, MRN:  [id number], DOB:  1955/11/27, LOS: 11 ADMISSION DATE:  03/24/2019, CONSULTATION DATE:  04/13/19 REFERRING MD: Louellen Molder, MD CHIEF COMPLAINT:  Lung mass with mets  Brief History   64 year old male smoker with very severe COPD (FEV1 33%) who presented with acute hypoxemic respiratory failure secondary to post-obstructive pneumonia in setting of LUL compression/collapse from left lung mass from extensive stage SCLC.  Past Medical History  Depression, basal cell carcinoma, Heroin abuse  Significant Hospital Events   11/26 Admitted 11/27 CT guided biopsy of soft tissue nodule in the left posterior chest wall 11/28 pulmonary consulted.  Recommended routine bronchodilator therapy antibiotics and prednisone taper 11/29 Tachy/brady with HTN.  Cards consulted >> add amiodarone. VDRF, bronch >> endobronchial tumor in LUL and compression LLL 11/30 recurrent SVT. Diagnostic bronchoscopy was significant for endobronchial tumor burden in LUL and extrinsic compression of airways in LLL. No biopsy taken 12/01 Intermittent arrhythmias, but less frequent.  Start vent weaning.  FOB 12/32/20 The left upper lobe was fully occluded by an exophytic irregularly shaped endobronchial mass that extended distally into the segmental airways. Was associated erythema, no active bleeding. The left lower lobe airways were slightly compressed but were patent. 12/03 extubated 12/05 d/c amiodarone 12/9 run of SVT during the night.  Treated with calcium channel blocker.  4-day #8 of 12 radiation therapy.  Has completed day 3 of 3 planned chemotherapy today watching blood counts 12/10 on-going XRT; oxygen weaning.  12/11 seen by palliative care 12/12 see E link note, full NCB  Consults:  IR, Oncology, Rad/Onc Palliative carre 12/11   Procedures:  PICC 11/30>  Significant Diagnostic Tests:  PET 03/01/19 >> 8.5 cm LUL mass hypermetabolic with invasion Lt chest wall, ipsilateral  mediastinal LN met, Lt external iliac chain LN met, mets in Lt 6 and 7 ribs, 5 mm RUL nodule CT Chest W Contrast 04/01/2019 >> collapse LUL, progression of malignancy, post ob PNA Lt lung 2d ECHO 04/15/19 >> EF 60 to 65%, mild LVH, severe RA dilation MRI brain 04/17/19 >> at least 2 small left cerebellar metastatic lesions, possible small metastatic lesion adjacent to the right frontal horn  Micro Data:  BCx 04/11/2019 >> negative  Antimicrobials:  Zosyn 11/26 > 12/03  Interim history/subjective:  Sedated with prn fentanyl for pain control, no longer keeping sats up on 100% NRM   Objective   Blood pressure 125/77, pulse (!) 123, temperature (!) 100.5 F (38.1 C), temperature source Axillary, resp. rate (!) 26, height 6' 1" (1.854 m), weight 86 kg, SpO2 (!) 85 %.    FiO2 (%):  [100 %] 100 %   Intake/Output Summary (Last 24 hours) at 02-May-2019 0902 Last data filed at 04/28/2019 2216 Gross per 24 hour  Intake 225 ml  Output 1950 ml  Net -1725 ml   Filed Weights   04/27/19 0414 04/28/19 0500 2019/05/02 0500  Weight: 89.3 kg 85.4 kg 86 kg  7 liters   Physical Exam:     Terminally ill appearing wm lying flat with one pillow on 100% NRM  No jvd Neck supple Lungs with insp/exp rhonchi distant on R, absent bs on L  RRR no s3  abd soft Ext with severe chronic venous stasis changes with pitting edema  Neuro sedated, minimal resp to verbal        Resolved Hospital Problem list   VDRF Hyperglycemia COPD exacerbation Postobstructive pneumonia  Assessment & Plan:   Extensive SCLC with brain met. -  oncology, radiation oncology following, he has completed day 3 of cycle 3 and his chemotherapy with carboplatin and etoposide s improvement in pain or sob and now appears terminally ill so will change meds to reflect focus of care > comfort only   Plan:  Per palliative care   Cancer pain. Plan  Cont PRN analgesia    Acute hypoxic respiratory from [place] with airway compression,  post obstructive pneumonia, and severe COPD with emphysema.   Plan Cont scheduled BDs Cont oxygen but no bipap      Multifocal atrial tachycardia. - intermittent  Plan D/c CCB No need for tele  No heparin d/t brain mets   Intermittent Fluid and electrolyte imbalance: hypophosphatemia resolved   Plan  d/c labs   Leukocytosis ->got Granix 12/7,  No additional rx     Sacral deep tissue pressure injury (not present on admission) Lower leg venous stasis. Plan  Cont eucerin cream on legs and silicone foam dressing to sacrum for comfort    Hypercalcemia of malignancy. - tx with zoledronic acid 03/18/2019/ no more planned    Clearly the high likelihood of prolonging suffering from pulmonary interventions vastly outweighs any reasonable chance of benefit from offering anything else because medical science has done all it can here to restore health.    Best practice:  Diet: NPO x for ice chips, sips as desired  DVT prophylaxis: lovenox d/c'd 12/13  GI prophylaxis: not indicated Mobility: activity as tolerated Code Status: full NCB  Keep in ICU for now to be sure comfort goals met ? To Hospice 12/14 ?    Christinia Gully, MD Pulmonary and [place] [phone] After 5:30 PM or weekends, use [facility] [phone]

## 2019-05-18 DEATH — deceased

## 2019-07-06 NOTE — Progress Notes (Signed)
  Radiation Oncology         [phone] ________________________________  Name: Omar Wagner MRN: [id number]  Date: 04/27/2019  DOB: November 23, 1955  End of Treatment Note  Diagnosis:   Lung cancer     Indication for treatment::  palliative       Radiation treatment dates:   04/16/19 - 04/27/19  Site/dose:   The patient was planned to receive 30 Gy in 10 fractions to the right lung using a 3-field 3D conformal technique. After 4 fractions, the dose was decreased to 2.5 Gy per day with 8 remaining fractions planned. He receieved 5 of these 8 fractions at 2.5 Gy per day before discontinuing XRT.  Narrative: The patient tolerated radiation treatment, but the patient's status did not markedly improved and XRT was discontinued after 9 fractions.    Plan: The patient has completed radiation treatment. The patient will return to radiation oncology clinic on a prn basis. ________________________________  Jodelle Gross, M.D., Ph.D.

## 2019-07-19 NOTE — Progress Notes (Signed)
  Radiation Oncology         [phone]) [phone] ________________________________  Name: Omar Wagner MRN: [id number]  Date: 04/16/2019  DOB: Aug 09, 1955  SIMULATION AND TREATMENT PLANNING NOTE  DIAGNOSIS:     ICD-10-CM   1. Malignant neoplasm of upper lobe of left lung ([place])  C34.12      Site:  chest  NARRATIVE:  The patient was brought to the [facility].  Identity was confirmed.  All relevant records and images related to the planned course of therapy were reviewed.   Written consent to proceed with treatment was confirmed which was freely given after reviewing the details related to the planned course of therapy had been reviewed with the patient.  Then, the patient was set-up in a stable reproducible  supine position for radiation therapy.  CT images were obtained.  Surface markings were placed.     The CT images were loaded into the planning software.  Then the target and avoidance structures were contoured.  Treatment planning then occurred.  The radiation prescription was entered and confirmed.  A total of 3 complex treatment devices were fabricated which relate to the designed radiation treatment fields. Additional reduced fields will be used as necessary to improve the dose homogeneity of the plan. Each of these customized fields/ complex treatment devices will be used on a daily basis during the radiation course. I have requested : 3D Simulation  I have requested a DVH of the following structures: target volume, spinal cord, lungs, heart.   The patient will undergo daily image guidance to ensure accurate localization of the target, and adequate minimize dose to the normal surrounding structures in close proximity to the target.   PLAN:  The patient will receive 30 Gy in 10 fractions.    ________________________________   Jodelle Gross, MD, PhD

## 2020-04-24 IMAGING — CT CT CHEST WITHOUT CONTRAST
2 of 3 series · 15 of 36 positions shown, 18 images · non-contrast
Comparison: Chest radiograph dated 10/27/2018

CLINICAL DATA: Follow-up abnormal chest radiograph

EXAM:
CT CHEST WITHOUT CONTRAST
TECHNIQUE: Multidetector CT imaging of the chest was performed following the
standard protocol without IV contrast.

[Series 2: thorax · axial · 0.75mm/px · z∈[-262,+22]mm · 12 of 168 slices shown, 15 images]
[im 13/168  mediastinal]
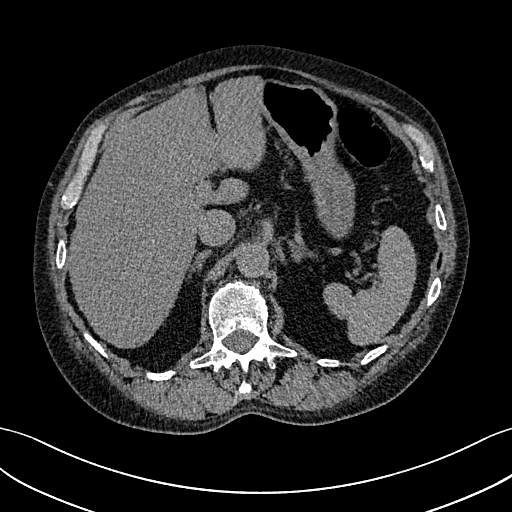
[im 13/168  lung]
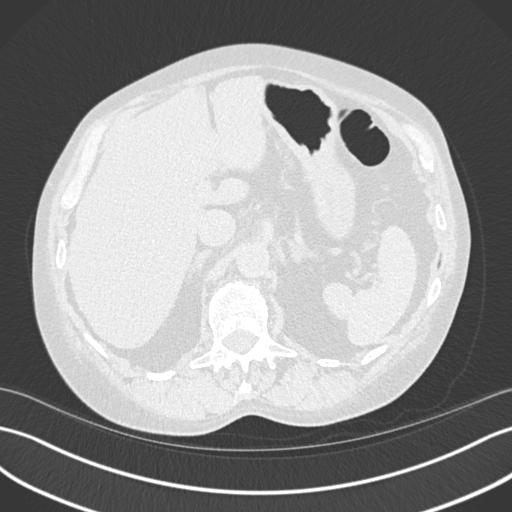
[im 25/168  lung]
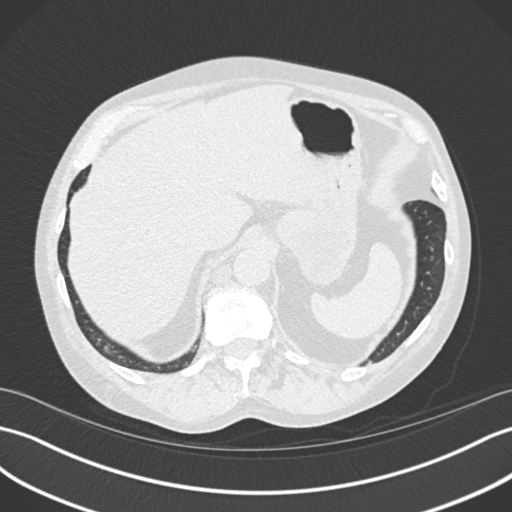
[im 38/168  lung]
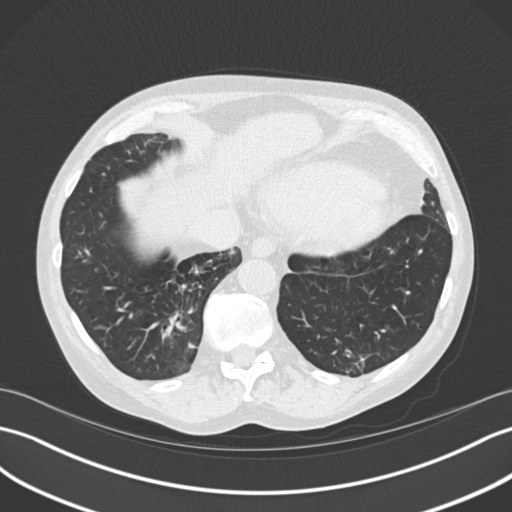
[im 50/168  lung]
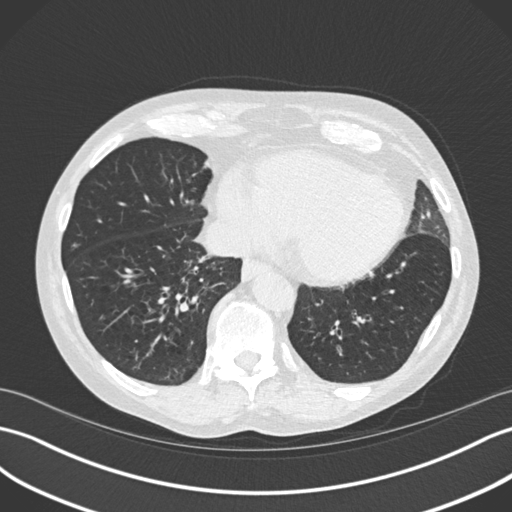
[im 62/168  mediastinal]
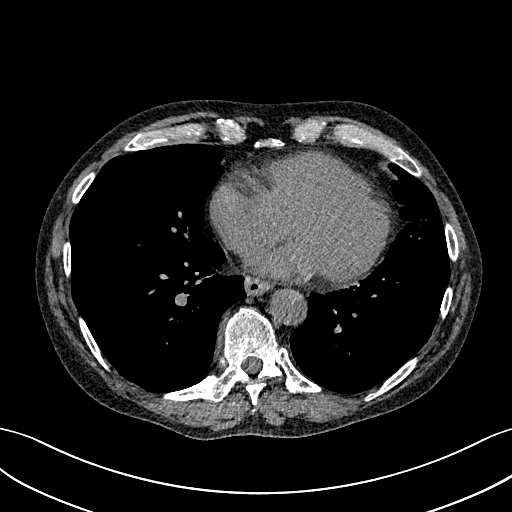
[im 62/168  lung]
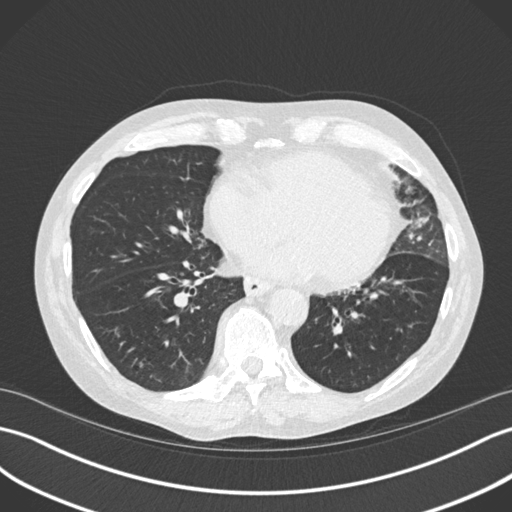
[im 75/168  lung]
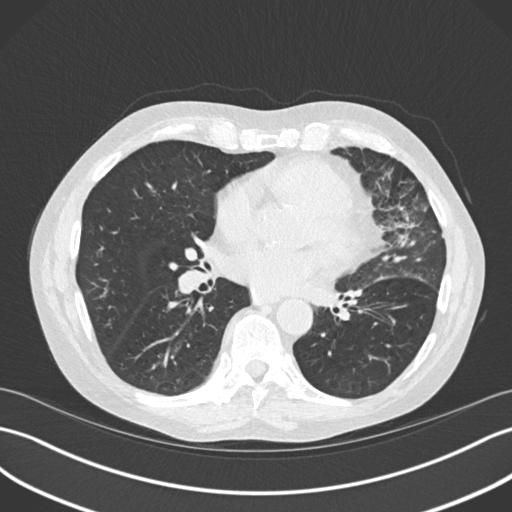
[im 93/168  lung]
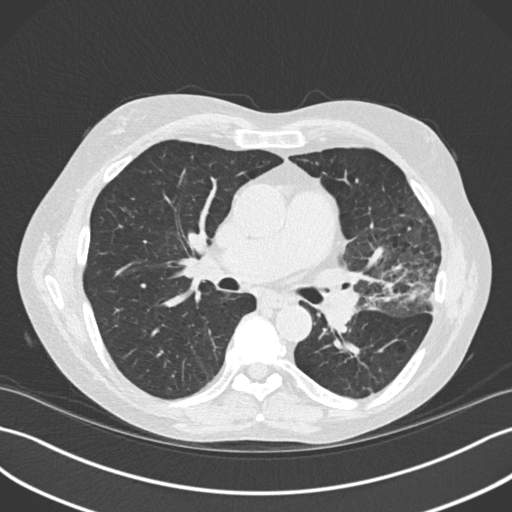
[im 106/168  lung]
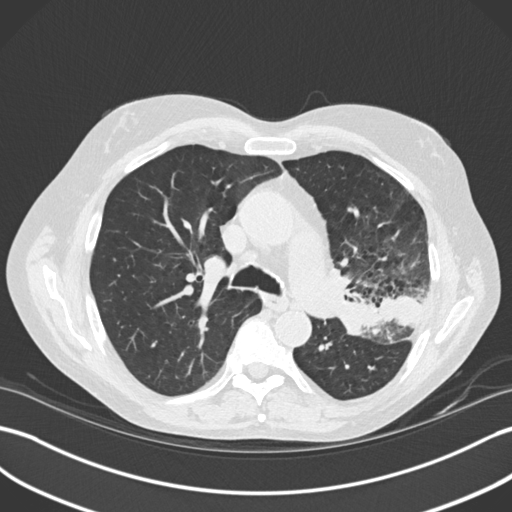
[im 118/168  mediastinal]
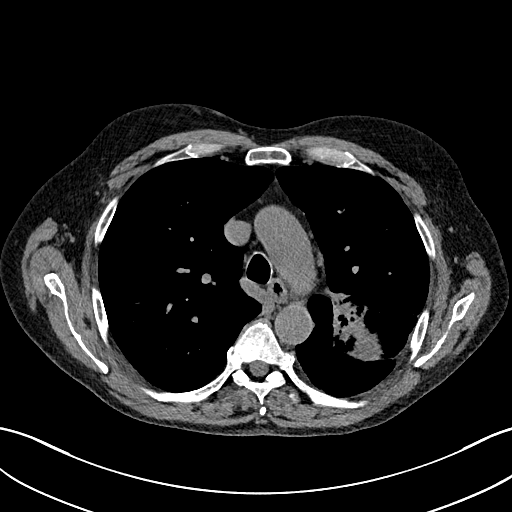
[im 118/168  lung]
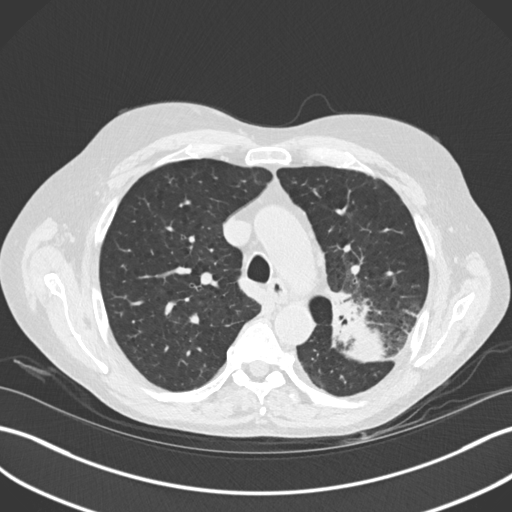
[im 130/168  lung]
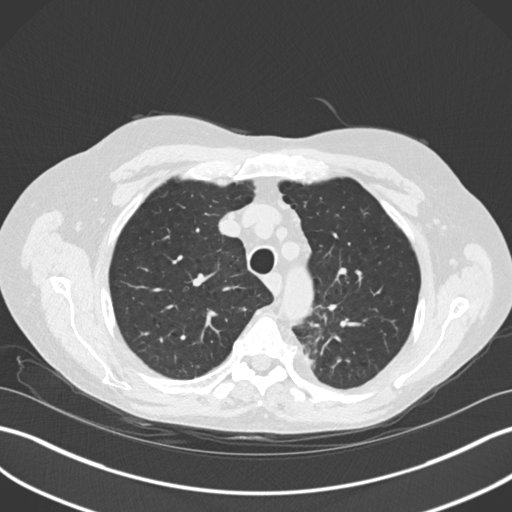
[im 143/168  lung]
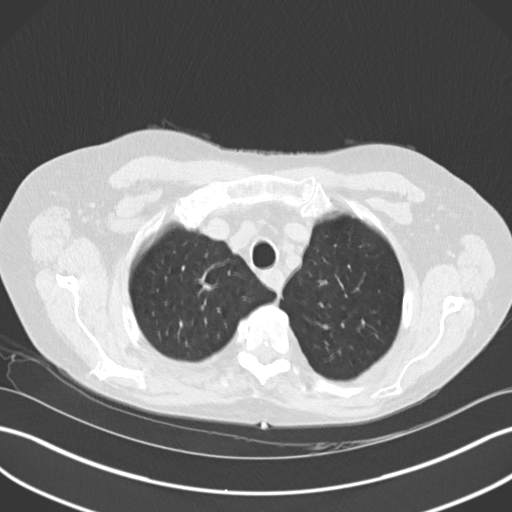
[im 155/168  lung]
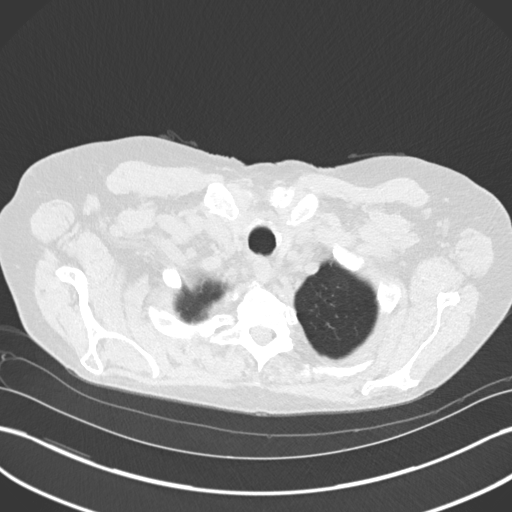

[Series 5: coronal · coronal · 0.66mm/px · 3 of 140 slices shown]
[im 28/140  lung]
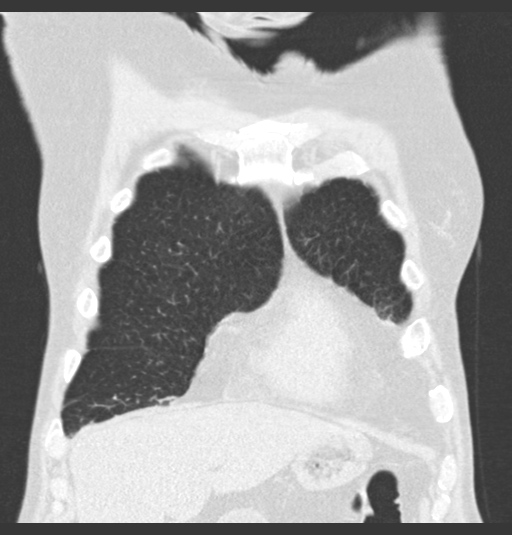
[im 56/140  lung]
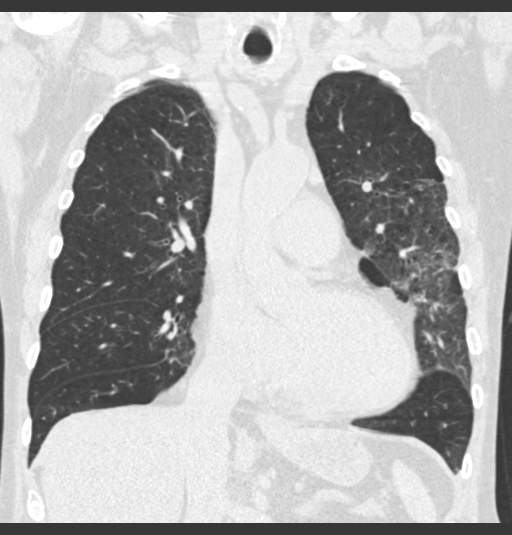
[im 84/140  lung]
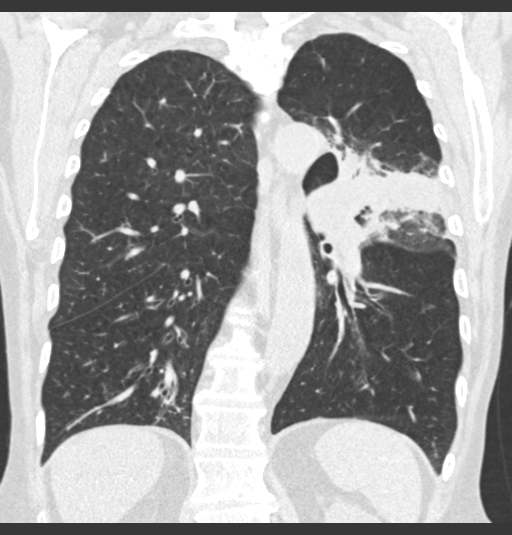

[15 of 36 positions shown; findings below may reference images not displayed]

FINDINGS: Cardiovascular: Heart is normal in size.  No pericardial effusion.

No evidence thoracic aortic aneurysm. Mild atherosclerotic
calcifications of the aortic arch.

Mediastinum/Nodes: Small mediastinal lymph nodes which do not meet
pathologic CT size criteria. However, given additional findings, a 9
mm short axis prevascular node (series 2/image 87) is considered
suspicious.

Visualized thyroid is unremarkable.

Lungs/Pleura: 3.9 x 5.9 cm mass in the posterior left upper lobe
(series 3/image 57). Given persistence, this is highly concerning
for primary bronchogenic neoplasm. However, it is unclear whether
this reflects a central endobronchial mass (centrally/posteriorly)
and a postobstructive opacity (laterally), or whether this all is
suspicious for tumor. Mass abuts/involves the left fissure and abuts
the left posterior chest wall (series 3/image 43).

Surrounding ground-glass opacities/interstitial prominence likely
reflects some degree of postobstructive opacity, although
interlobular septal thickening in the inferior left upper
lobe/lingula may also reflect lymphangitic carcinomatosis (series
3/image 92).

No suspicious satellite nodularity in the lungs bilaterally. Mild
peribronchovascular nodularity in the anterior right lower lobe
(series 3/image 111) is nonspecific.

Mild scarring in the bilateral lower lobes. Branching nodular
opacity in the medial right lower lobe favors platelike scarring
(series 3/image 129), although this technically may be new from
prior CT abdomen/pelvis.

Mild centrilobular and paraseptal emphysematous changes, upper lung
predominant. Lower lobe predominant bronchiectasis.

No pleural effusion or pneumothorax.

Upper Abdomen: Visualized upper abdomen is grossly unremarkable.

Musculoskeletal: Mild degenerative changes of the visualized
thoracolumbar spine.
IMPRESSION: 5.9 cm mass in the posterior left upper lobe, as described above,
suspicious for primary bronchogenic neoplasm. Possible lymphangitic
spread versus post structure opacity in the left upper lobe.

Small mediastinal lymph nodes, including a 9 mm short axis
prevascular node, indeterminate but suspicious.

Aortic Atherosclerosis (16KKX-6BV.V) and Emphysema (16KKX-QE9.Q).

## 2020-07-29 IMAGING — DX DG CHEST 1V PORT
1 series · 1 of 1 positions shown · non-contrast
Comparison: Chest radiograph 04/12/2019

CLINICAL DATA: Shortness of breath

EXAM:
PORTABLE CHEST 1 VIEW

[chest ap]
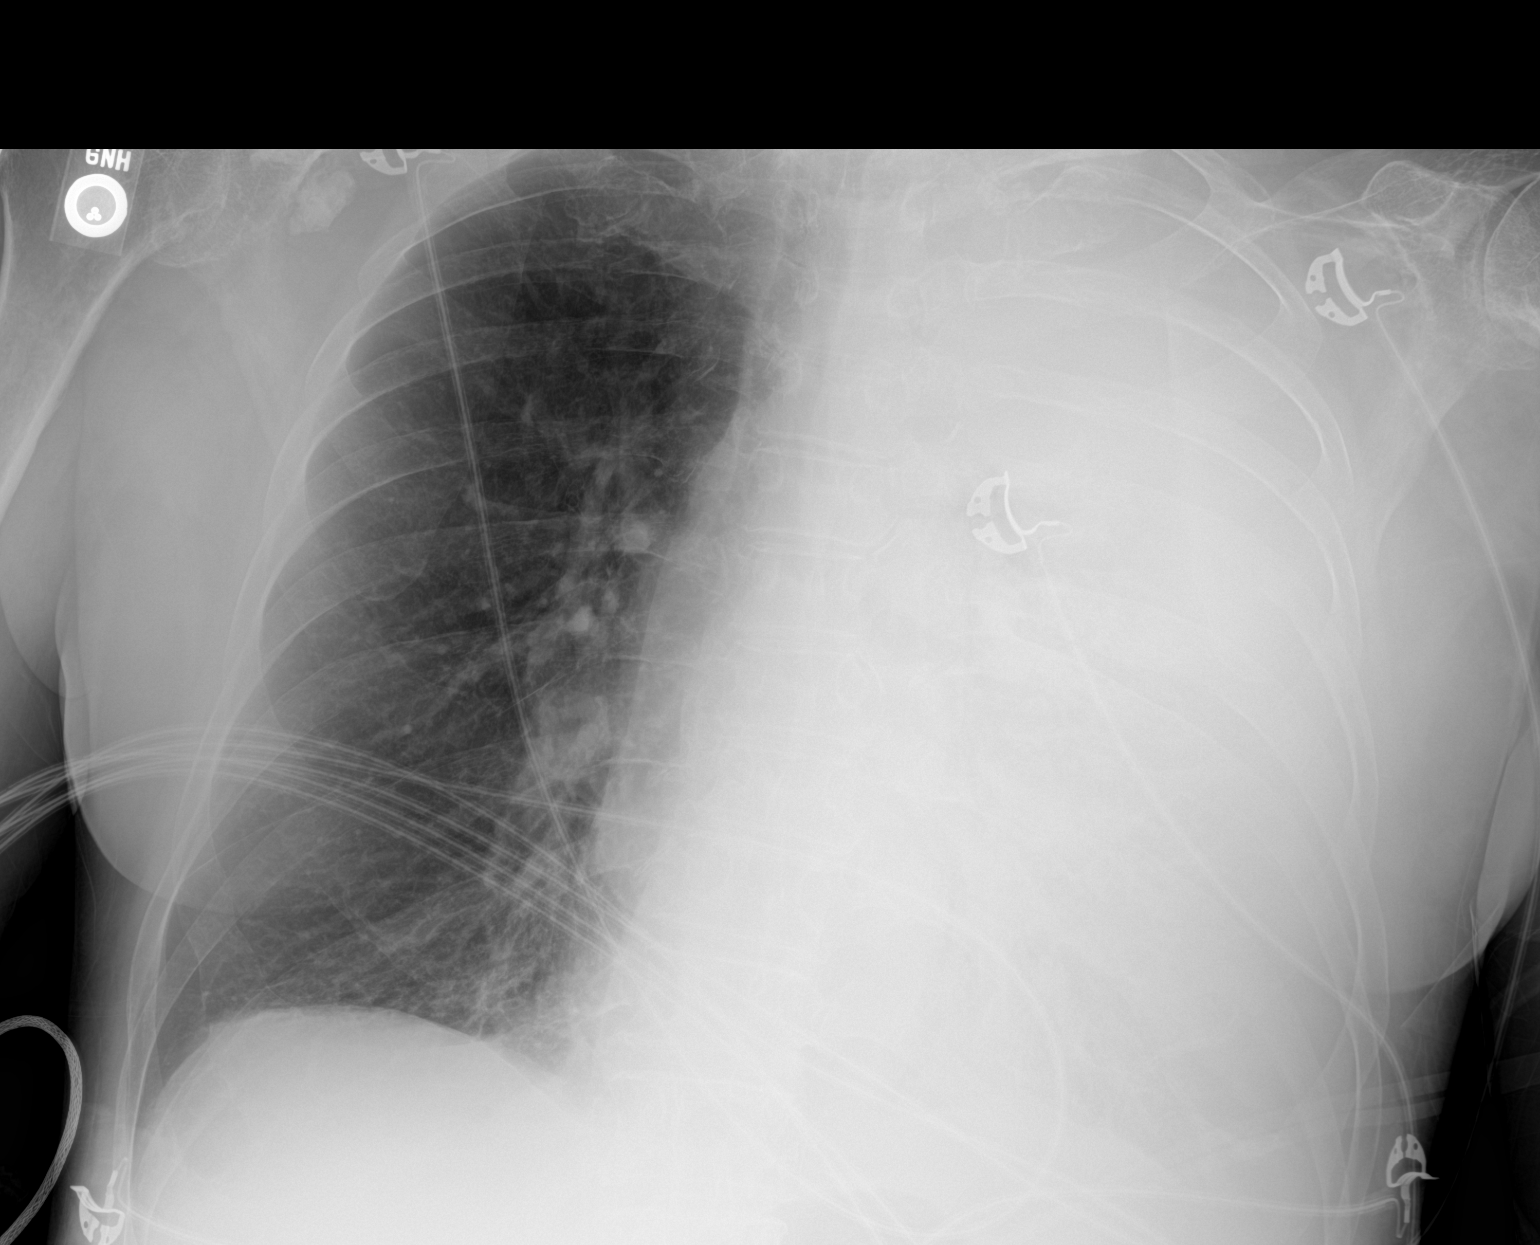

[1 of 1 positions shown; findings below may reference images not displayed]

FINDINGS: Monitoring leads overlie the patient. Interval worsening complete
opacification left hemithorax. Minimal right basilar atelectasis.
Thoracic spine degenerative changes.
IMPRESSION: Interval worsening of opacification of the left hemithorax most
compatible with known malignancy and associated postobstructive
atelectasis.

## 2020-07-30 IMAGING — DX DG CHEST 1V PORT
1 series · 1 of 1 positions shown · non-contrast
Comparison: Chest radiograph 04/15/2019

CLINICAL DATA: Status post ET tube placement.

EXAM:
PORTABLE CHEST 1 VIEW

[chest ap]
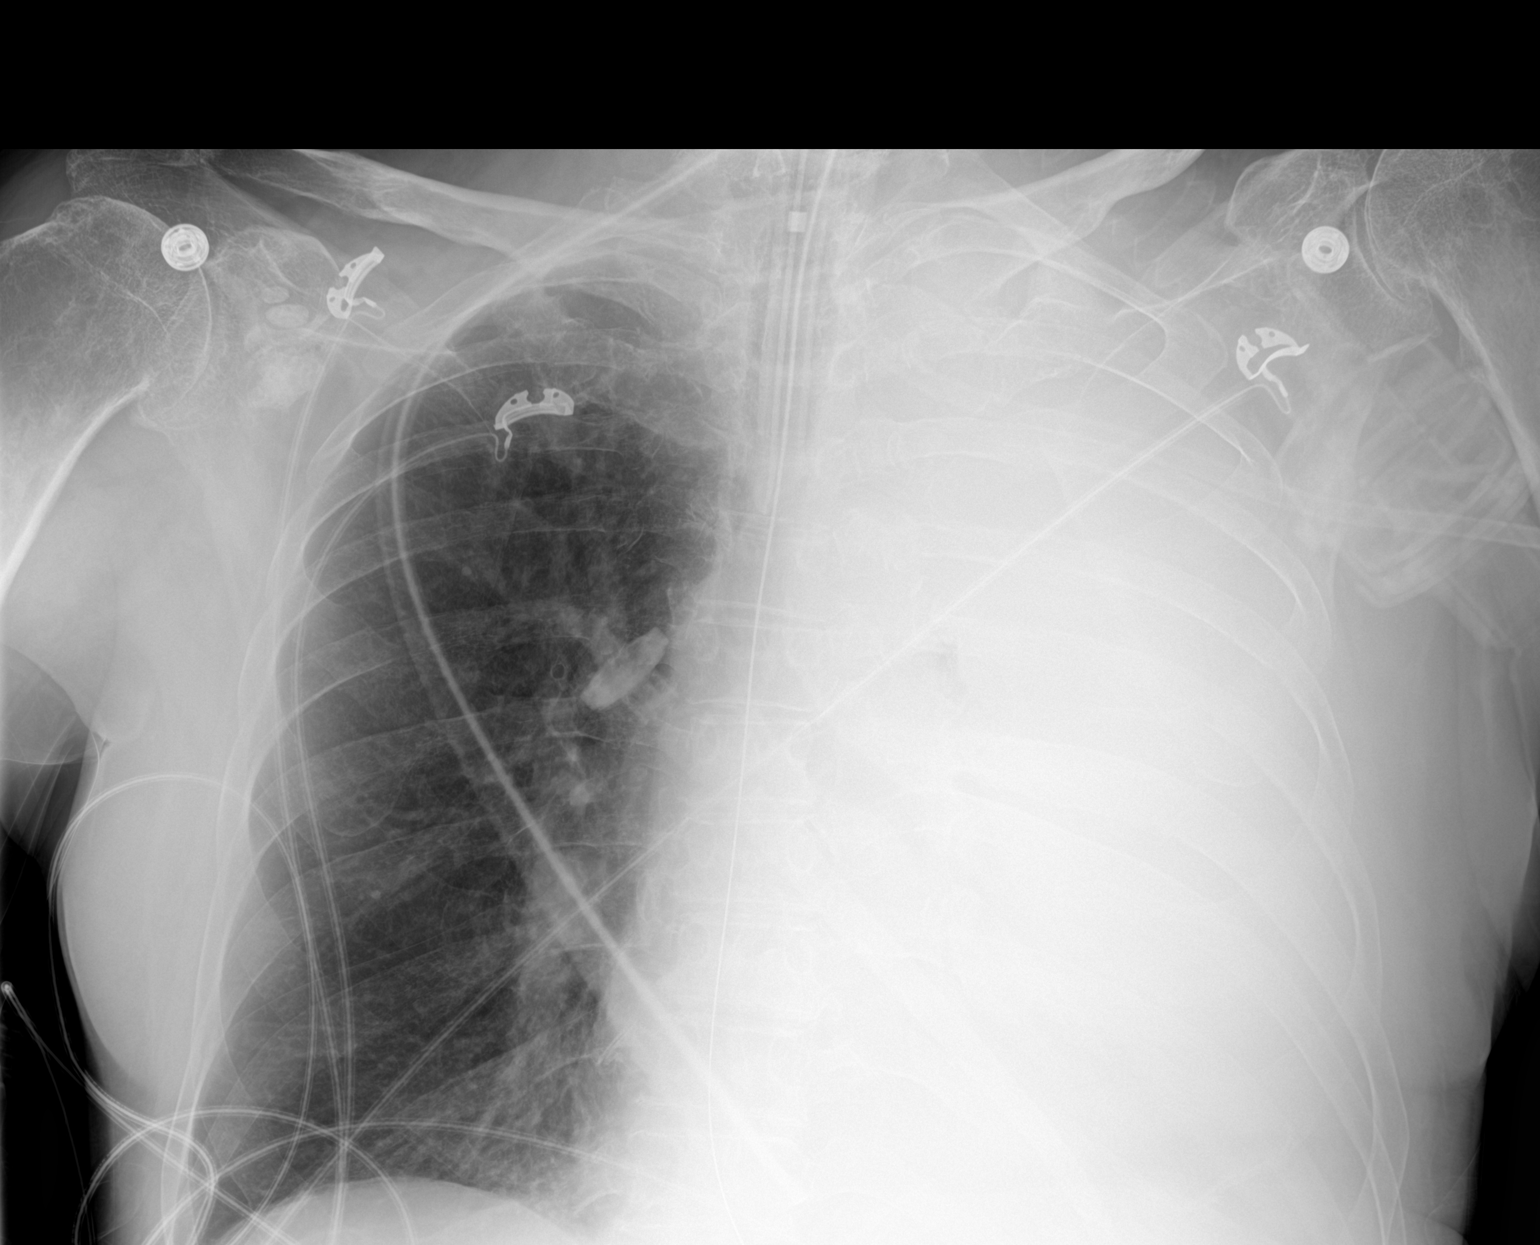

[1 of 1 positions shown; findings below may reference images not displayed]

FINDINGS: Monitoring leads overlie the patient. ET tube mid trachea. Enteric
tube courses inferior to the diaphragm. Persistent opacification of
the left hemithorax. Minimal right basilar atelectasis. Thoracic
spine degenerative changes.
IMPRESSION: ET tube mid trachea.

Persistent opacification of the left hemithorax.

## 2020-08-01 IMAGING — DX DG CHEST 1V PORT
1 series · 1 of 1 positions shown · non-contrast
Comparison: One-view chest x-ray 04/15/2019. CT chest 04/12/2019

CLINICAL DATA: Respiratory failure.

EXAM:
PORTABLE CHEST 1 VIEW

[chest ap]
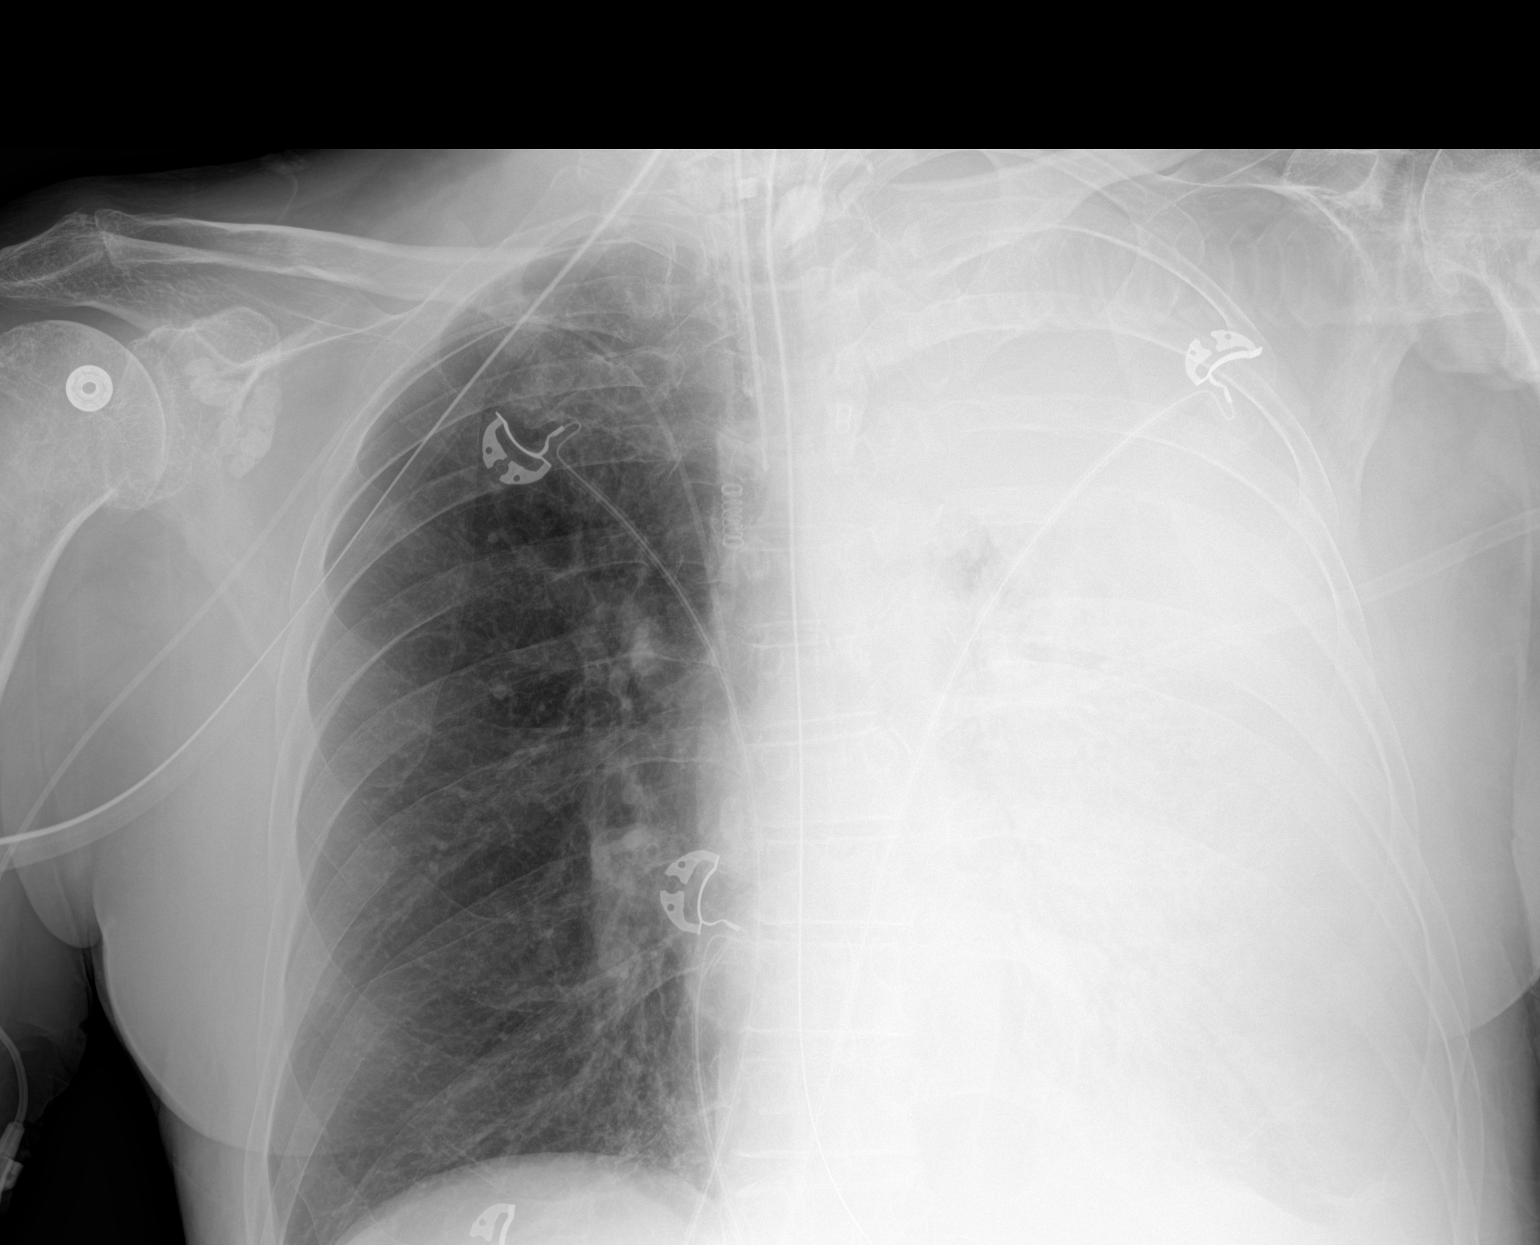

[1 of 1 positions shown; findings below may reference images not displayed]

FINDINGS: The heart is obscured. Endotracheal tube is stable. Right-sided PICC
line is stable. NG tube courses off the inferior border of the film.

Near complete opacification of left hemithorax is stable. The right
lung is clear.
IMPRESSION: 1. Stable appearance of the chest with near complete opacification
of the left hemithorax reflecting known left lung mass and post
obstructive disease.
2. The support apparatus is stable.
